# Patient Record
Sex: Female | Born: 1960 | Race: White | Hispanic: No | Marital: Married | Smoking: Former smoker
Health system: Southern US, Community
[De-identification: ages and names within clinical notes are randomized; demographics above are authoritative.]

## PROBLEM LIST (undated history)

## (undated) DIAGNOSIS — R0600 Dyspnea, unspecified: Secondary | ICD-10-CM

## (undated) DIAGNOSIS — C169 Malignant neoplasm of stomach, unspecified: Secondary | ICD-10-CM

## (undated) DIAGNOSIS — F32A Depression, unspecified: Secondary | ICD-10-CM

## (undated) DIAGNOSIS — K922 Gastrointestinal hemorrhage, unspecified: Secondary | ICD-10-CM

## (undated) DIAGNOSIS — I10 Essential (primary) hypertension: Secondary | ICD-10-CM

## (undated) DIAGNOSIS — E039 Hypothyroidism, unspecified: Secondary | ICD-10-CM

## (undated) DIAGNOSIS — C50919 Malignant neoplasm of unspecified site of unspecified female breast: Secondary | ICD-10-CM

## (undated) DIAGNOSIS — Z5189 Encounter for other specified aftercare: Secondary | ICD-10-CM

## (undated) DIAGNOSIS — J45909 Unspecified asthma, uncomplicated: Secondary | ICD-10-CM

## (undated) DIAGNOSIS — C801 Malignant (primary) neoplasm, unspecified: Secondary | ICD-10-CM

## (undated) DIAGNOSIS — D649 Anemia, unspecified: Secondary | ICD-10-CM

## (undated) DIAGNOSIS — E079 Disorder of thyroid, unspecified: Secondary | ICD-10-CM

## (undated) HISTORY — DX: Anemia, unspecified: D64.9

## (undated) HISTORY — PX: UPPER GASTROINTESTINAL ENDOSCOPY: SHX188

## (undated) HISTORY — PX: CARPAL TUNNEL RELEASE: SHX101

## (undated) HISTORY — PX: COLONOSCOPY: SHX174

## (undated) HISTORY — DX: Encounter for other specified aftercare: Z51.89

## (undated) HISTORY — PX: BARIATRIC SURGERY: SHX1103

## (undated) HISTORY — DX: Malignant neoplasm of stomach, unspecified: C16.9

## (undated) HISTORY — DX: Gastrointestinal hemorrhage, unspecified: K92.2

## (undated) HISTORY — DX: Malignant neoplasm of unspecified site of unspecified female breast: C50.919

---

## 1994-03-18 HISTORY — PX: ABDOMINAL HYSTERECTOMY: SHX81

## 2005-03-18 HISTORY — PX: BILATERAL SALPINGOOPHORECTOMY: SHX1223

## 2005-03-18 HISTORY — PX: MASTECTOMY: SHX3

## 2014-04-27 ENCOUNTER — Encounter: Payer: Self-pay | Admitting: *Deleted

## 2014-04-27 ENCOUNTER — Emergency Department (INDEPENDENT_AMBULATORY_CARE_PROVIDER_SITE_OTHER): Payer: BLUE CROSS/BLUE SHIELD

## 2014-04-27 ENCOUNTER — Emergency Department (INDEPENDENT_AMBULATORY_CARE_PROVIDER_SITE_OTHER)
Admission: EM | Admit: 2014-04-27 | Discharge: 2014-04-27 | Disposition: A | Discharge status: Home / Self Care | Payer: BLUE CROSS/BLUE SHIELD | Source: Home / Self Care | Attending: Family Medicine | Admitting: Family Medicine

## 2014-04-27 DIAGNOSIS — R05 Cough: Secondary | ICD-10-CM

## 2014-04-27 DIAGNOSIS — R062 Wheezing: Secondary | ICD-10-CM

## 2014-04-27 DIAGNOSIS — Z87891 Personal history of nicotine dependence: Secondary | ICD-10-CM

## 2014-04-27 DIAGNOSIS — J209 Acute bronchitis, unspecified: Secondary | ICD-10-CM

## 2014-04-27 HISTORY — DX: Disorder of thyroid, unspecified: E07.9

## 2014-04-27 HISTORY — DX: Essential (primary) hypertension: I10

## 2014-04-27 MED ORDER — PREDNISONE 20 MG PO TABS: 20.0000 mg | ORAL_TABLET | Freq: Two times a day (BID) | ORAL | Status: DC

## 2014-04-27 MED ORDER — BENZONATATE 200 MG PO CAPS: 200.0000 mg | ORAL_CAPSULE | Freq: Every day | ORAL | Status: DC

## 2014-04-27 MED ORDER — ERYTHROMYCIN BASE 500 MG PO TABS: ORAL_TABLET | ORAL | Status: DC

## 2014-04-27 MED ORDER — DOXYCYCLINE HYCLATE 100 MG PO CAPS: 100.0000 mg | ORAL_CAPSULE | Freq: Two times a day (BID) | ORAL | Status: DC

## 2014-04-27 NOTE — ED Notes (Signed)
Mariah Kemp c/o cough, congestion, wheezing, and SOB with exertion x 5 weeks. Cough is worse when lying down. No otc meds taken, afebrile.

## 2014-04-27 NOTE — ED Provider Notes (Signed)
CSN: [id number]     Arrival date & time 04/27/14  7353 History   First MD Initiated Contact with Patient 04/27/14 [phone]     Chief Complaint  Patient presents with  . Cough      HPI Comments: About 1.5 months ago patient developed typical cold-like symptoms including mild sore throat, sinus congestion, headache, fatigue, and cough.  All symptoms resolved except for a persistent cough, now partly productive.  She has developed wheezing and shortness of breath with activity.  The wheezing is worse at night.  No pleuritic pain.  No fevers, chills, and sweats.  She occasionally gags when coughing. She does not have asthma, but she states that she has had wheezing in the past during a respiratory illness.  She does not remember her last Tdap.  The history is provided by the patient.    Past Medical History  Diagnosis Date  . Hypertension   . Thyroid disease    Past Surgical History  Procedure Laterality Date  . Abdominal hysterectomy    . Carpal tunnel release    . Mastectomy     Family History  Problem Relation Age of Onset  . Cancer Father     liver CA   History  Substance Use Topics  . Smoking status: Former Smoker    Quit date: 04/27/1994  . Smokeless tobacco: Never Used  . Alcohol Use: No   OB History    No data available     Review of Systems No sore throat + cough No pleuritic pain + wheezing No nasal congestion No post-nasal drainage No sinus pain/pressure No itchy/red eyes No earache No hemoptysis + SOB No fever/chills No nausea No vomiting No abdominal pain No diarrhea No urinary symptoms No skin rash No fatigue No myalgias No headache Used OTC meds without relief  Allergies  Review of patient's allergies indicates no known allergies.  Home Medications   Prior to Admission medications   Medication Sig Start Date End Date Taking? Authorizing Provider  levothyroxine (SYNTHROID, LEVOTHROID) 137 MCG tablet Take 137 mcg by mouth daily before  breakfast.   Yes Historical Provider, MD  lisinopril-hydrochlorothiazide (PRINZIDE,ZESTORETIC) 20-25 MG per tablet Take 1 tablet by mouth daily.   Yes Historical Provider, MD  benzonatate (TESSALON) 200 MG capsule Take 1 capsule (200 mg total) by mouth at bedtime. Take as needed for cough 04/27/14   Kandra Nicolas, MD  doxycycline (VIBRAMYCIN) 100 MG capsule Take 1 capsule (100 mg total) by mouth 2 (two) times daily. Take with food. 04/27/14   Kandra Nicolas, MD  predniSONE (DELTASONE) 20 MG tablet Take 1 tablet (20 mg total) by mouth 2 (two) times daily. Take with food. 04/27/14   Kandra Nicolas, MD   BP 115/77 mmHg  Pulse 77  Temp(Src) 97.7 F (36.5 C) (Oral)  Resp 18  Ht 5' 0.5" (1.537 m)  Wt 180 lb (81.647 kg)  BMI 34.56 kg/m2  SpO2 96% Physical Exam Nursing notes and Vital Signs reviewed. Appearance:  Patient appears stated age, and in no acute distress.  Patient is obese (BMI 34.6) Eyes:  Pupils are equal, round, and reactive to light and accomodation.  Extraocular movement is intact.  Conjunctivae are not inflamed  Ears:  Canals normal.  Tympanic membranes normal.  Nose:  Mildly congested turbinates.  No sinus tenderness.    Pharynx:  Normal Neck:  Supple.  Enlarged posterior nodes with tenderness on the left. Lungs:  Clear to auscultation.  Breath sounds are equal.  Heart:  Regular rate and rhythm without murmurs, rubs, or gallops.  Abdomen:  Nontender without masses or hepatosplenomegaly.  Bowel sounds are present.  No CVA or flank tenderness.  Extremities:  No edema.  No calf tenderness Skin:  No rash present.   ED Course  Procedures  none    Imaging Review Dg Chest 2 View  04/27/2014   CLINICAL DATA:  Cough and wheezing.  Former smoker  EXAM: CHEST  2 VIEW  COMPARISON:  None.  FINDINGS: Diffuse interstitial coarsening which is likely bronchitic. No evidence for pneumonia or edema. No effusion or pneumothorax. Normal heart size and mediastinal contours. Extensive surgical  change to the right breast and axilla.  IMPRESSION: Bronchitic markings without pneumonia or edema.   Electronically Signed   By: Monte Fantasia M.D.   On: 04/27/2014 10:46     MDM   1. Acute bronchitis, unspecified organism    Begin doxycycline 100mg  BID to cover atypicals.  Prescription written for Benzonatate [facility]) to take at bedtime for night-time cough.  Begin prednisone burst  Take plain guaifenesin 1200mg  (Mucinex) twice daily, with plenty of water, for cough and congestion.  Get adequate rest.   Try warm salt water gargles for sore throat.  Stop all antihistamines for now, and other non-prescription cough/cold preparations. Recommend a Tdap when well.  Follow-up with family doctor if not improving about one week.  If not improved must consider ACEI induced cough.    Kandra Nicolas, MD 04/27/14 1218

## 2014-04-27 NOTE — Discharge Instructions (Signed)
Take plain guaifenesin 1200mg  (Mucinex) twice daily, with plenty of water, for cough and congestion.  Get adequate rest.   Try warm salt water gargles for sore throat.  Stop all antihistamines for now, and other non-prescription cough/cold preparations. Recommend a Tdap when well.  Follow-up with family doctor if not improving about one week.

## 2014-05-31 ENCOUNTER — Encounter: Payer: Self-pay | Admitting: Family Medicine

## 2014-05-31 ENCOUNTER — Ambulatory Visit (INDEPENDENT_AMBULATORY_CARE_PROVIDER_SITE_OTHER): Payer: BLUE CROSS/BLUE SHIELD | Admitting: Family Medicine

## 2014-05-31 VITALS — BP 110/74 | HR 76 | Ht 60.0 in | Wt 180.0 lb

## 2014-05-31 DIAGNOSIS — I10 Essential (primary) hypertension: Secondary | ICD-10-CM | POA: Diagnosis not present

## 2014-05-31 DIAGNOSIS — R062 Wheezing: Secondary | ICD-10-CM

## 2014-05-31 DIAGNOSIS — E039 Hypothyroidism, unspecified: Secondary | ICD-10-CM | POA: Diagnosis not present

## 2014-05-31 DIAGNOSIS — Z1239 Encounter for other screening for malignant neoplasm of breast: Secondary | ICD-10-CM | POA: Diagnosis not present

## 2014-05-31 DIAGNOSIS — J410 Simple chronic bronchitis: Secondary | ICD-10-CM

## 2014-05-31 DIAGNOSIS — J42 Unspecified chronic bronchitis: Secondary | ICD-10-CM | POA: Insufficient documentation

## 2014-05-31 MED ORDER — TIOTROPIUM BROMIDE-OLODATEROL 2.5-2.5 MCG/ACT IN AERS: 2.0000 [IU] | INHALATION_SPRAY | Freq: Every day | RESPIRATORY_TRACT | Status: DC

## 2014-05-31 NOTE — Progress Notes (Signed)
CC: Mariah Kemp is a 54 y.o. female is here for Establish Care   Subjective: HPI:  Very pleasant 54 year old here to establish care  History of essential hypertension she is on on lisinopril-hydrochlorothiazide and has been taking this daily for over 5 years now. She tells me that ever since she started this dose she has had normotensive blood pressures. She denies any known side effects or intolerance. Denies any nonproductive cough or angioedema.  Reports a history of hypothyroidism that was diagnosed in her mid 28s. She tells me that her TSH has fluctuated on a every 3 month basis over the past few years and she is currently taking 137 g of levothyroxine but has been up to 300 g a day at one point. She believes it's been more than 3 months since her TSH was checked last. She's had some unintentional weight gain but no skin changes or hair changes. No gastrointestinal complaints.  She complains of wheezing on a daily basis that has been present for at least 3 months now. She's had this in the past decades ago however it went away after she stopped smoking. She tells me it's worse in the evening and at its worst it's only mild in severity but becoming much more annoying. The wheezing resolves for a few hours if she coughs. She has a daily productive cough but never with blood in her sputum. He also reports shortness of breath but only after climbing stairs. She denies fevers, chills, nor chest discomfort  Review of Systems - General ROS: negative for - chills, fever, night sweats, weight loss Ophthalmic ROS: negative for - decreased vision Psychological ROS: negative for - anxiety or depression ENT ROS: negative for - hearing change, nasal congestion, tinnitus or allergies Hematological and Lymphatic ROS: negative for - bleeding problems, bruising or swollen lymph nodes Breast ROS: negative Cardiovascular ROS: no chest pain  Gastrointestinal ROS: no abdominal pain, change in bowel habits, or  black or bloody stools Genito-Urinary ROS: negative for - genital discharge, genital ulcers, incontinence or abnormal bleeding from genitals Musculoskeletal ROS: negative for - joint pain or muscle pain Neurological ROS: negative for - headaches or memory loss Dermatological ROS: negative for lumps, mole changes, rash and skin lesion changes  Past Medical History  Diagnosis Date  . Hypertension   . Thyroid disease     Past Surgical History  Procedure Laterality Date  . Abdominal hysterectomy  1996  . Carpal tunnel release    . Mastectomy  2007   Family History  Problem Relation Age of Onset  . Cancer Father     liver CA    History   Social History  . Marital Status: Married    Spouse Name: N/A  . Number of Children: N/A  . Years of Education: N/A   Occupational History  . Not on file.   Social History Main Topics  . Smoking status: Former Smoker    Quit date: 04/27/1994  . Smokeless tobacco: Never Used  . Alcohol Use: No  . Drug Use: No  . Sexual Activity: No   Other Topics Concern  . Not on file   Social History Narrative     Objective: BP 110/74 mmHg  Pulse 76  Ht 5' (1.524 m)  Wt 180 lb (81.647 kg)  BMI 35.15 kg/m2  General: Alert and Oriented, No Acute Distress HEENT: Pupils equal, round, reactive to light. Conjunctivae clear.  Moist mucous membranes and pharynx Unremarkable. No palpable thyromegaly Lungs: Comfortable work of breathing  with trace end expiratory wheezing in the right upper posterior lung field and left lower posterior lung field. No rhonchi or rales Cardiac: Regular rate and rhythm. Normal S1/S2.  No murmurs, rubs, nor gallops.   Extremities: No peripheral edema.  Strong peripheral pulses.  Mental Status: No depression, anxiety, nor agitation. Skin: Warm and dry.  Assessment & Plan: Mariah Kemp was seen today for establish care.  Diagnoses and all orders for this visit:  Essential hypertension  Hypothyroidism, unspecified  hypothyroidism type Orders: -     TSH  Wheezing Orders: -     Tiotropium Bromide-Olodaterol (STIOLTO RESPIMAT) 2.5-2.5 MCG/ACT AERS; Inhale 2 Units into the lungs daily.  Screening for malignant neoplasm of breast Orders: -     MM DIGITAL SCREENING BILATERAL; Future  Simple chronic bronchitis   Essential hypertension: Controlled continue lisinopril-hydrochlorothiazide Hypothyroidism: Due for TSH, continue 137 g of levothyroxine pending results. She's a few months overdue for mammography, she tells me that she's been released to having screening mammograms no longer requiring diagnostics. Wheezing most likely represents chronic bronchitis given her history of smoking and x-ray findings from last month. I've encouraged her to begin stiolto daily a sample was provided to her and have asked her to call me if she would like a formal prescription.  Return for 2-3 Months for routine follow up.

## 2014-06-01 ENCOUNTER — Telehealth: Payer: Self-pay | Admitting: Family Medicine

## 2014-06-01 LAB — TSH: TSH: 31.475 u[IU]/mL — ABNORMAL HIGH (ref 0.350–4.500)

## 2014-06-01 MED ORDER — LEVOTHYROXINE SODIUM 150 MCG PO TABS: 150.0000 ug | ORAL_TABLET | Freq: Every day | ORAL | Status: DC

## 2014-06-01 NOTE — Telephone Encounter (Signed)
Seth Bake, Will you please let patient know that her thyroid test confirms that her current levothyroxine dose is too low.  I've sent in a new dose of 150 micrograms to her [facility], we'll want to recheck this in 3 months.

## 2014-06-01 NOTE — Telephone Encounter (Signed)
Pt.notified

## 2014-08-03 ENCOUNTER — Other Ambulatory Visit: Payer: Self-pay

## 2014-08-03 MED ORDER — LISINOPRIL-HYDROCHLOROTHIAZIDE 20-25 MG PO TABS: 1.0000 | ORAL_TABLET | Freq: Every day | ORAL | Status: DC

## 2014-08-03 NOTE — Telephone Encounter (Signed)
Patient called stated that she was seen on 03/15 /16 to EST, patient stated that PCP was waiting on her medical records to be transferred over. Patient is requesting a refill on her Lisinopril. Is it ok to refill and if so how many pills and refills? Verble Styron,CMA

## 2014-08-03 NOTE — Telephone Encounter (Signed)
Patient has been informed that Lisinopril was sent to her [place]. Rhonda Cunningham,CMA

## 2014-08-03 NOTE — Telephone Encounter (Signed)
Rx sent to [facility] in [place] 

## 2014-08-04 ENCOUNTER — Telehealth: Payer: Self-pay | Admitting: *Deleted

## 2014-08-04 ENCOUNTER — Ambulatory Visit (INDEPENDENT_AMBULATORY_CARE_PROVIDER_SITE_OTHER): Payer: BLUE CROSS/BLUE SHIELD

## 2014-08-04 ENCOUNTER — Other Ambulatory Visit: Payer: Self-pay | Admitting: *Deleted

## 2014-08-04 DIAGNOSIS — R062 Wheezing: Secondary | ICD-10-CM

## 2014-08-04 DIAGNOSIS — Z1231 Encounter for screening mammogram for malignant neoplasm of breast: Secondary | ICD-10-CM | POA: Diagnosis not present

## 2014-08-04 DIAGNOSIS — Z1239 Encounter for other screening for malignant neoplasm of breast: Secondary | ICD-10-CM

## 2014-08-04 MED ORDER — TIOTROPIUM BROMIDE-OLODATEROL 2.5-2.5 MCG/ACT IN AERS: 2.0000 [IU] | INHALATION_SPRAY | Freq: Every day | RESPIRATORY_TRACT | Status: DC

## 2014-08-04 NOTE — Telephone Encounter (Signed)
Pt walked in today asking for her stiolto to be sent to the pharm so I took care of that.  While she was here she asked if you had received her records yet.  She wanted to wait until you had everything before she made her f/u appt with you.  Please advise.

## 2014-08-05 NOTE — Telephone Encounter (Signed)
Yes records have been received.

## 2014-08-05 NOTE — Telephone Encounter (Signed)
Pt notified & transferred to scheduling. 

## 2014-08-19 ENCOUNTER — Ambulatory Visit (INDEPENDENT_AMBULATORY_CARE_PROVIDER_SITE_OTHER): Payer: BLUE CROSS/BLUE SHIELD | Admitting: Family Medicine

## 2014-08-19 ENCOUNTER — Encounter: Payer: Self-pay | Admitting: Family Medicine

## 2014-08-19 VITALS — BP 118/77 | HR 68 | Ht 60.0 in | Wt 180.0 lb

## 2014-08-19 DIAGNOSIS — Z0000 Encounter for general adult medical examination without abnormal findings: Secondary | ICD-10-CM | POA: Diagnosis not present

## 2014-08-19 DIAGNOSIS — Z853 Personal history of malignant neoplasm of breast: Secondary | ICD-10-CM | POA: Diagnosis not present

## 2014-08-19 DIAGNOSIS — J342 Deviated nasal septum: Secondary | ICD-10-CM

## 2014-08-19 NOTE — Progress Notes (Signed)
CC: Mariah Kemp is a 54 y.o. female is here for Annual Exam   Subjective: HPI:  Colonoscopy: Normal at age 67 repeated H 58 Papsmear: Not indicated after hysterectomy  Mammogram: UTD   Influenza Vaccine: No current indication Pneumovax: No current indication Td/Tdap: declined Zoster: (Start 54 yo)   Review of Systems - General ROS: negative for - chills, fever, night sweats, weight gain or weight loss Ophthalmic ROS: negative for - decreased vision Psychological ROS: negative for - anxiety or depression ENT ROS: negative for - hearing change, nasal congestion, tinnitus or allergies Hematological and Lymphatic ROS: negative for - bleeding problems, bruising or swollen lymph nodes Breast ROS: negative Respiratory ROS: no cough, shortness of breath, or wheezing Cardiovascular ROS: no chest pain or dyspnea on exertion Gastrointestinal ROS: no abdominal pain, change in bowel habits, or black or bloody stools Genito-Urinary ROS: negative for - genital discharge, genital ulcers, incontinence or abnormal bleeding from genitals Musculoskeletal ROS: negative for - joint pain or muscle pain Neurological ROS: negative for - headaches or memory loss Dermatological ROS: negative for lumps, mole changes, rash and skin lesion changes  Past Medical History  Diagnosis Date  . Hypertension   . Thyroid disease     Past Surgical History  Procedure Laterality Date  . Abdominal hysterectomy  1996  . Carpal tunnel release    . Mastectomy  2007   Family History  Problem Relation Age of Onset  . Cancer Father     liver CA    History   Social History  . Marital Status: Married    Spouse Name: N/A  . Number of Children: N/A  . Years of Education: N/A   Occupational History  . Not on file.   Social History Main Topics  . Smoking status: Former Smoker    Quit date: 04/27/1994  . Smokeless tobacco: Never Used  . Alcohol Use: No  . Drug Use: No  . Sexual Activity: No   Other Topics  Concern  . Not on file   Social History Narrative     Objective: BP 118/77 mmHg  Pulse 68  Ht 5' (1.524 m)  Wt 180 lb (81.647 kg)  BMI 35.15 kg/m2  General: No Acute Distress HEENT: Atraumatic, normocephalic, conjunctivae normal without scleral icterus.  No nasal discharge mild deviated septum to the left, hearing grossly intact, TMs with good landmarks bilaterally with no middle ear abnormalities, posterior pharynx clear without oral lesions. Neck: Supple, trachea midline, no cervical nor supraclavicular adenopathy. Pulmonary: Clear to auscultation bilaterally without wheezing, rhonchi, nor rales. Cardiac: Regular rate and rhythm.  No murmurs, rubs, nor gallops. No peripheral edema.  2+ peripheral pulses bilaterally. Abdomen: Bowel sounds normal.  No masses.  Non-tender without rebound.  Negative Murphy's sign. MSK: Grossly intact, no signs of weakness.  Full strength throughout upper and lower extremities.  Full ROM in upper and lower extremities.  No midline spinal tenderness. Neuro: Gait unremarkable, CN II-XII grossly intact.  C5-C6 Reflex 2/4 Bilaterally, L4 Reflex 2/4 Bilaterally.  Cerebellar function intact. Skin: No rashes. Psych: Alert and oriented to person/place/time.  Thought process normal. No anxiety/depression.   Assessment & Plan: [name] was seen today for annual exam.  Diagnoses and all orders for this visit:  Annual physical exam Orders: -     TSH -     Comp Met (CMET) -     Lipid panel -     CBC  Deviated septum Orders: -     Ambulatory referral to  ENT  History of breast cancer   Healthy lifestyle interventions including but not limited to regular exercise, a healthy low fat diet, moderation of salt intake, the dangers of tobacco/alcohol/recreational drug use, nutrition supplementation, and accident avoidance were discussed with the patient and a handout was provided for future reference.  She would like a referral to ENT for further management  deviated septum since it interferes with breathing of the nose Return in about 3 months (around 11/19/2014).

## 2014-08-19 NOTE — Patient Instructions (Signed)
Dr. Eleaner Dibartolo's General Advice Following Your Complete Physical Exam  The Benefits of Regular Exercise: Unless you suffer from an uncontrolled cardiovascular condition, studies strongly suggest that regular exercise and physical activity will add to both the quality and length of your life.  The World Health Organization recommends 150 minutes of moderate intensity aerobic activity every week.  This is best split over 3-4 days a week, and can be as simple as a brisk walk for just over 35 minutes "most days of the week".  This type of exercise has been shown to lower LDL-Cholesterol, lower average blood sugars, lower blood pressure, lower cardiovascular disease risk, improve memory, and increase one's overall sense of wellbeing.  The addition of anaerobic (or "strength training") exercises offers additional benefits including but not limited to increased metabolism, prevention of osteoporosis, and improved overall cholesterol levels.  How Can I Strive For A Low-Fat Diet?: Current guidelines recommend that 25-35 percent of your daily energy (food) intake should come from fats.  One might ask how can this be achieved without having to dissect each meal on a daily basis?  Switch to skim or 1% milk instead of whole milk.  Focus on lean meats such as ground turkey, fresh fish, baked chicken, and lean cuts of beef as your source of dietary protein.  Limit saturated fat consumption to less than 10% of your daily caloric intake.  Limit trans fatty acid consumption primarily by limiting synthetic trans fats such as partially hydrogenated oils (Ex: fried fast foods).  Substitute olive or vegetable oil for solid fats where possible.  Moderation of Salt Intake: Provided you don't carry a diagnosis of congestive heart failure nor renal failure, I recommend a daily allowance of no more than 2300 mg of salt (sodium).  Keeping under this daily goal is associated with a decreased risk of cardiovascular events, creeping  above it can lead to elevated blood pressures and increases your risk of cardiovascular events.  Milligrams (mg) of salt is listed on all nutrition labels, and your daily intake can add up faster than you think.  Most canned and frozen dinners can pack in over half your daily salt allowance in one meal.    Lifestyle Health Risks: Certain lifestyle choices carry specific health risks.  As you may already know, tobacco use has been associated with increasing one's risk of cardiovascular disease, pulmonary disease, numerous cancers, among many other issues.  What you may not know is that there are medications and nicotine replacement strategies that can more than double your chances of successfully quitting.  I would be thrilled to help manage your quitting strategy if you currently use tobacco products.  When it comes to alcohol use, I've yet to find an "ideal" daily allowance.  Provided an individual does not have a medical condition that is exacerbated by alcohol consumption, general guidelines determine "safe drinking" as no more than two standard drinks for a man or no more than one standard drink for a female per day.  However, much debate still exists on whether any amount of alcohol consumption is technically "safe".  My general advice, keep alcohol consumption to a minimum for general health promotion.  If you or others believe that alcohol, tobacco, or recreational drug use is interfering with your life, I would be happy to provide confidential counseling regarding treatment options.  General "Over The Counter" Nutrition Advice: Postmenopausal women should aim for a daily calcium intake of 1200 mg, however a significant portion of this might already be   provided by diets including milk, yogurt, cheese, and other dairy products.  Vitamin D has been shown to help preserve bone density, prevent fatigue, and has even been shown to help reduce falls in the elderly.  Ensuring a daily intake of 800 Units of  Vitamin D is a good place to start to enjoy the above benefits, we can easily check your Vitamin D level to see if you'd potentially benefit from supplementation beyond 800 Units a day.  Folic Acid intake should be of particular concern to women of childbearing age.  Daily consumption of 400-800 mcg of Folic Acid is recommended to minimize the chance of spinal cord defects in a fetus should pregnancy occur.    For many adults, accidents still remain one of the most common culprits when it comes to cause of death.  Some of the simplest but most effective preventitive habits you can adopt include regular seatbelt use, proper helmet use, securing firearms, and regularly testing your smoke and carbon monoxide detectors.  Mariah Kemp B. Mariah Arpin DO [facility] [address], [address] , [place] [address] Phone: [phone]  

## 2014-08-24 ENCOUNTER — Telehealth: Payer: Self-pay | Admitting: Family Medicine

## 2014-08-24 DIAGNOSIS — E781 Pure hyperglyceridemia: Secondary | ICD-10-CM | POA: Insufficient documentation

## 2014-08-24 DIAGNOSIS — E039 Hypothyroidism, unspecified: Secondary | ICD-10-CM | POA: Insufficient documentation

## 2014-08-24 LAB — COMPREHENSIVE METABOLIC PANEL
ALT: 36 U/L — ABNORMAL HIGH (ref 0–35)
AST: 31 U/L (ref 0–37)
Albumin: 3.9 g/dL (ref 3.5–5.2)
Alkaline Phosphatase: 55 U/L (ref 39–117)
BUN: 15 mg/dL (ref 6–23)
CHLORIDE: 102 meq/L (ref 96–112)
CO2: 30 meq/L (ref 19–32)
Calcium: 9.2 mg/dL (ref 8.4–10.5)
Creat: 0.58 mg/dL (ref 0.50–1.10)
Glucose, Bld: 93 mg/dL (ref 70–99)
Potassium: 4.2 mEq/L (ref 3.5–5.3)
Sodium: 141 mEq/L (ref 135–145)
TOTAL PROTEIN: 7 g/dL (ref 6.0–8.3)
Total Bilirubin: 0.3 mg/dL (ref 0.2–1.2)

## 2014-08-24 LAB — CBC
HCT: 36.2 % (ref 36.0–46.0)
Hemoglobin: 12.4 g/dL (ref 12.0–15.0)
MCH: 32 pg (ref 26.0–34.0)
MCHC: 34.3 g/dL (ref 30.0–36.0)
MCV: 93.3 fL (ref 78.0–100.0)
MPV: 11.2 fL (ref 8.6–12.4)
Platelets: 231 10*3/uL (ref 150–400)
RBC: 3.88 MIL/uL (ref 3.87–5.11)
RDW: 13.7 % (ref 11.5–15.5)
WBC: 6.2 10*3/uL (ref 4.0–10.5)

## 2014-08-24 LAB — LIPID PANEL
CHOLESTEROL: 186 mg/dL (ref 0–200)
HDL: 28 mg/dL — ABNORMAL LOW (ref >=46)
LDL CALC: 86 mg/dL (ref 0–99)
Total CHOL/HDL Ratio: 6.6 Ratio
Triglycerides: 362 mg/dL — ABNORMAL HIGH (ref <150)
VLDL: 72 mg/dL — AB (ref 0–40)

## 2014-08-24 LAB — TSH: TSH: 14.169 u[IU]/mL — AB (ref 0.350–4.500)

## 2014-08-24 MED ORDER — LEVOTHYROXINE SODIUM 175 MCG PO TABS: 175.0000 ug | ORAL_TABLET | Freq: Every day | ORAL | Status: DC

## 2014-08-24 NOTE — Telephone Encounter (Signed)
Left message on vm with results  

## 2014-08-24 NOTE — Telephone Encounter (Signed)
Mariah Kemp, Will you please let patient know that her thyroid levels have improved but it still appears that her supplement is under-dosed therefore I've sent a new Rx of 175 micrograms of levothyroxine to her [facility].  Also her triglyceride level was moderately elevated which is causing some mild liver inflammation.  This can be improved with engaging in 30-45 minutes of moderate exercise most days of the week and starting a 1g OTC fish oil capsule twice a day.  I'd recommend she return in two months to have these numbers rechecked.

## 2014-09-07 ENCOUNTER — Encounter: Payer: Self-pay | Admitting: Family Medicine

## 2014-09-07 DIAGNOSIS — J339 Nasal polyp, unspecified: Secondary | ICD-10-CM | POA: Insufficient documentation

## 2014-12-09 ENCOUNTER — Ambulatory Visit: Payer: BLUE CROSS/BLUE SHIELD | Admitting: Family Medicine

## 2014-12-15 ENCOUNTER — Ambulatory Visit (INDEPENDENT_AMBULATORY_CARE_PROVIDER_SITE_OTHER): Payer: BLUE CROSS/BLUE SHIELD | Admitting: Family Medicine

## 2014-12-15 ENCOUNTER — Encounter: Payer: Self-pay | Admitting: Family Medicine

## 2014-12-15 VITALS — BP 117/76 | HR 63 | Wt 182.0 lb

## 2014-12-15 DIAGNOSIS — I10 Essential (primary) hypertension: Secondary | ICD-10-CM

## 2014-12-15 DIAGNOSIS — E781 Pure hyperglyceridemia: Secondary | ICD-10-CM | POA: Diagnosis not present

## 2014-12-15 DIAGNOSIS — E039 Hypothyroidism, unspecified: Secondary | ICD-10-CM | POA: Diagnosis not present

## 2014-12-15 DIAGNOSIS — R062 Wheezing: Secondary | ICD-10-CM

## 2014-12-15 MED ORDER — LISINOPRIL-HYDROCHLOROTHIAZIDE 20-25 MG PO TABS: 1.0000 | ORAL_TABLET | Freq: Every day | ORAL | Status: DC

## 2014-12-15 MED ORDER — TIOTROPIUM BROMIDE-OLODATEROL 2.5-2.5 MCG/ACT IN AERS: 2.0000 [IU] | INHALATION_SPRAY | Freq: Every day | RESPIRATORY_TRACT | Status: DC

## 2014-12-15 NOTE — Progress Notes (Signed)
CC: Mariah Kemp is a 54 y.o. female is here for Hypothyroidism   Subjective: HPI:   follow-up hypothyroidism:  Since I saw her last she has increased her levothyroxine as prescribed. She denies any side effects with the new dosage. She denies any intentional weight loss or gain. There has been no change in energy, mood, no hair or skin changes. Denies any gastrointestinal complaints   Follow-up hypertriglyceridemia: She was unable to tolerate fish oil capsules due to fishy regurgitation. This stopped after she stopped taking the Fish oil. She is not interested in ever trying fish oil again.   Follow-up hypertension: Continues to take lisinopril-hydrochlorothiazide on a daily basis without known side effects. No outside blood pressures to report. No chest pain shortness of breath orthopnea nor peripheral edema   Follow-up wheezing: she tells me that [employer]  Works really well to reduce her wheezing however she only uses it on  An as-needed basis.  She denies any wheezing at the present time nor any cough or chest pain   Review Of Systems Outlined In HPI  Past Medical History  Diagnosis Date  . Hypertension   . Thyroid disease     Past Surgical History  Procedure Laterality Date  . Abdominal hysterectomy  1996  . Carpal tunnel release    . Mastectomy  2007   Family History  Problem Relation Age of Onset  . Cancer Father     liver [place]    Social History   Social History  . Marital Status: Married    Spouse Name: N/A  . Number of Children: N/A  . Years of Education: N/A   Occupational History  . Not on file.   Social History Main Topics  . Smoking status: Former Smoker    Quit date: 04/27/1994  . Smokeless tobacco: Never Used  . Alcohol Use: No  . Drug Use: No  . Sexual Activity: No   Other Topics Concern  . Not on file   Social History Narrative     Objective: BP 117/76 mmHg  Pulse 63  Wt 182 lb (82.555 kg)  General: Alert and Oriented, No Acute  Distress HEENT: Pupils equal, round, reactive to light. Conjunctivae clear.   Moist mucous membranes Lungs:  Comfortable work of breathing with trace  Expiratory wheezing. No rales or rhonchi. Cardiac: Regular rate and rhythm. Normal S1/S2.  No murmurs, rubs, nor gallops.   Extremities: No peripheral edema.  Strong peripheral pulses.  Mental Status: No depression, anxiety, nor agitation. Skin: Warm and dry.  Assessment & Plan: [place] was seen today for hypothyroidism.  Diagnoses and all orders for this visit:  Hypothyroidism, unspecified hypothyroidism type -     TSH  Hypertriglyceridemia  Essential hypertension  Wheezing -     Tiotropium Bromide-Olodaterol (STIOLTO RESPIMAT) 2.5-2.5 MCG/ACT AERS; Inhale 2 Units into the lungs daily.  Other orders -     lisinopril-hydrochlorothiazide (PRINZIDE,ZESTORETIC) 20-25 MG tablet; Take 1 tablet by mouth daily.    hypothyroidism: Continue levothyroxine pending TSH which is due today, clinically controlled.  Hypertriglyceridemia: Discussed trying Metamucil every evening to help with reduction of triglycerides, recheck in 3 months  Wheezing: Encouraged to use stiolto  On a daily basis,  Pointed out her wheezing today   And this would improve  If she took the medication on a daily basis  essential hypertension: Controlled continue lisinopril-HCTZ  Return if symptoms worsen or fail to improve.

## 2014-12-16 ENCOUNTER — Telehealth: Payer: Self-pay | Admitting: Family Medicine

## 2014-12-16 LAB — TSH: TSH: 3.512 u[IU]/mL (ref 0.350–4.500)

## 2014-12-16 MED ORDER — LEVOTHYROXINE SODIUM 175 MCG PO TABS: 175.0000 ug | ORAL_TABLET | Freq: Every day | ORAL | Status: DC

## 2014-12-16 NOTE — Telephone Encounter (Signed)
Mariah Kemp, Will you please let patient know that it appears her thyroid supplement dose is adequate therefore I've sent refills to her [facility]. F/U in six months.

## 2014-12-16 NOTE — Telephone Encounter (Signed)
Left message on vm with results  

## 2015-03-03 ENCOUNTER — Telehealth: Payer: Self-pay

## 2015-03-03 MED ORDER — ACYCLOVIR 400 MG PO TABS: 400.0000 mg | ORAL_TABLET | Freq: Three times a day (TID) | ORAL | Status: DC

## 2015-03-03 NOTE — Telephone Encounter (Signed)
Requested medication has been sent to [facility] in [place]

## 2015-03-03 NOTE — Telephone Encounter (Signed)
Patient called and left a message: broke out with fever blisters. She would like a refill on Zovirax. This is not on her medication list.

## 2015-07-22 ENCOUNTER — Other Ambulatory Visit: Payer: Self-pay | Admitting: Family Medicine

## 2015-09-06 ENCOUNTER — Encounter: Payer: Self-pay | Admitting: Family Medicine

## 2015-09-06 ENCOUNTER — Ambulatory Visit (INDEPENDENT_AMBULATORY_CARE_PROVIDER_SITE_OTHER): Payer: Self-pay | Admitting: Family Medicine

## 2015-09-06 VITALS — BP 107/72 | HR 81 | Wt 172.0 lb

## 2015-09-06 DIAGNOSIS — J410 Simple chronic bronchitis: Secondary | ICD-10-CM

## 2015-09-06 DIAGNOSIS — R062 Wheezing: Secondary | ICD-10-CM

## 2015-09-06 DIAGNOSIS — E039 Hypothyroidism, unspecified: Secondary | ICD-10-CM

## 2015-09-06 DIAGNOSIS — I10 Essential (primary) hypertension: Secondary | ICD-10-CM

## 2015-09-06 MED ORDER — TIOTROPIUM BROMIDE-OLODATEROL 2.5-2.5 MCG/ACT IN AERS: 2.0000 [IU] | INHALATION_SPRAY | Freq: Every day | RESPIRATORY_TRACT | Status: DC

## 2015-09-06 NOTE — Progress Notes (Signed)
CC: Mariah Kemp is a 55 y.o. female is here for Medication Refill   Subjective: HPI:  Follow-up bronchitis: She is using stilito a few times a week but not daily because she cannot afford to get refills on this medication. She tells me that if she is wheezing or has a cough to possible resolve her symptoms for at least 2 or 3 days. Nothing seems to make symptoms better or worse other than that described above. She denies any shortness of breath or blood in sputum.  Follow-up essential hypertension: Taking lisinopril-hydrochlorothiazide with 100% compliance. No chest pain orthopnea nor peripheral edema. She denies any motor or sensory disturbances. No outside blood pressures report  Follow-up hyperthyroidism: She denies any skin or hair changes nor any unintentional weight loss or gain. Taking levothyroxine with 100% compliance.     Review Of Systems Outlined In HPI  Past Medical History  Diagnosis Date  . Hypertension   . Thyroid disease     Past Surgical History  Procedure Laterality Date  . Abdominal hysterectomy  1996  . Carpal tunnel release    . Mastectomy  2007   Family History  Problem Relation Age of Onset  . Cancer Father     liver [place]    Social History   Social History  . Marital Status: Married    Spouse Name: N/A  . Number of Children: N/A  . Years of Education: N/A   Occupational History  . Not on file.   Social History Main Topics  . Smoking status: Former Smoker    Quit date: 04/27/1994  . Smokeless tobacco: Never Used  . Alcohol Use: No  . Drug Use: No  . Sexual Activity: No   Other Topics Concern  . Not on file   Social History Narrative     Objective: BP 107/72 mmHg  Pulse 81  Wt 172 lb (78.019 kg)  General: Alert and Oriented, No Acute Distress HEENT: Pupils equal, round, reactive to light. Conjunctivae clear.  Moist mucous membranes Lungs: Comfortable work of breathing with trace wheezing in the left upper posterior lung field. All  other fields are clear without rhonchi or rales Cardiac: Regular rate and rhythm. Normal S1/S2.  No murmurs, rubs, nor gallops.   Extremities: No peripheral edema.  Strong peripheral pulses.  Mental Status: No depression, anxiety, nor agitation. Skin: Warm and dry.  Assessment & Plan: Mariah Kemp was seen today for medication refill.  Diagnoses and all orders for this visit:  Simple chronic bronchitis ([place])  Essential hypertension  Hypothyroidism, unspecified hypothyroidism type -     TSH  Wheezing -     Tiotropium Bromide-Olodaterol (STIOLTO RESPIMAT) 2.5-2.5 MCG/ACT AERS; Inhale 2 Units into the lungs daily.   Chronic bronchitis: Uncontrolled, this will be improved with the 4 samples of Stiolto I gave her today to lower the financial burden on taking this medication on a daily basis. Essential hypertension: Controlled continue lisinopril to hydrochlorothiazide with refills tomorrow along with levothyroxine pending TSH Hypothyroidism: Clinically controlled but due for TSH will refill levothyroxine pending results   Return in about 6 months (around 03/07/2016).

## 2015-09-07 ENCOUNTER — Telehealth: Payer: Self-pay | Admitting: Family Medicine

## 2015-09-07 LAB — TSH: TSH: 0.17 m[IU]/L — AB

## 2015-09-07 MED ORDER — LEVOTHYROXINE SODIUM 175 MCG PO TABS: 175.0000 ug | ORAL_TABLET | Freq: Every day | ORAL | Status: DC

## 2015-09-07 MED ORDER — LISINOPRIL-HYDROCHLOROTHIAZIDE 20-25 MG PO TABS: 1.0000 | ORAL_TABLET | Freq: Every day | ORAL | Status: DC

## 2015-09-07 NOTE — Telephone Encounter (Signed)
Will you please let patient know that her thyroid supplement appears to be slightly higher than needed.  Since she's feeling so normal I don't think any drastic change needs to be applied to her levothyroxine but I'd recommend that once a week she take only half a tablet otherwise take a full tablet daily and return in 3 months to recheck labs.

## 2015-09-07 NOTE — Telephone Encounter (Signed)
vm left for pt to return call to clinic

## 2015-09-21 NOTE — Telephone Encounter (Signed)
Pt.notified

## 2015-12-11 ENCOUNTER — Other Ambulatory Visit: Payer: Self-pay | Admitting: Family Medicine

## 2016-03-27 ENCOUNTER — Other Ambulatory Visit: Payer: Self-pay

## 2016-03-27 MED ORDER — LEVOTHYROXINE SODIUM 175 MCG PO TABS: 175.0000 ug | ORAL_TABLET | Freq: Every day | ORAL | 0 refills | Status: DC

## 2016-03-27 NOTE — Progress Notes (Signed)
Pt called requesting a refill of synthroid to hold her over until she establishes care with Emeterio Reeve, DO on 04/02/2016. rx sent.

## 2016-03-28 ENCOUNTER — Ambulatory Visit: Payer: Self-pay | Admitting: Osteopathic Medicine

## 2016-04-02 ENCOUNTER — Encounter: Payer: Self-pay | Admitting: Osteopathic Medicine

## 2016-04-02 ENCOUNTER — Ambulatory Visit (INDEPENDENT_AMBULATORY_CARE_PROVIDER_SITE_OTHER): Payer: Self-pay | Admitting: Osteopathic Medicine

## 2016-04-02 VITALS — BP 113/64 | HR 72 | Ht 60.0 in | Wt 182.0 lb

## 2016-04-02 DIAGNOSIS — I10 Essential (primary) hypertension: Secondary | ICD-10-CM

## 2016-04-02 DIAGNOSIS — E781 Pure hyperglyceridemia: Secondary | ICD-10-CM

## 2016-04-02 DIAGNOSIS — J410 Simple chronic bronchitis: Secondary | ICD-10-CM

## 2016-04-02 DIAGNOSIS — B001 Herpesviral vesicular dermatitis: Secondary | ICD-10-CM

## 2016-04-02 DIAGNOSIS — Z853 Personal history of malignant neoplasm of breast: Secondary | ICD-10-CM

## 2016-04-02 DIAGNOSIS — E039 Hypothyroidism, unspecified: Secondary | ICD-10-CM

## 2016-04-02 MED ORDER — ACYCLOVIR 400 MG PO TABS: 400.0000 mg | ORAL_TABLET | Freq: Three times a day (TID) | ORAL | 2 refills | Status: DC

## 2016-04-02 NOTE — Progress Notes (Signed)
HPI: Mariah Kemp is a 56 y.o. female  who presents to [place] today, 04/02/16,  for chief complaint of:  Chief Complaint  Patient presents with  . Other    Switch from [facility]/ [facility] renewal     Essential hypertension: No home blood pressures report, no chest pain, pressure, shortness of breath. Not out of medications.  Simple chronic bronchitis: stiolto uses most days, typically experiences some wheezing type symptoms usually in the evenings. Has never had pulmonary function test or a chest x-ray to evaluate this issue. Insurance apparently is no longer covering her inhaled medication but she is not out of this  Hypothyroidism, unspecified type: Has been frustrated in the past with frequent adjustment of dose, would rather not have the TSH checked frequently or have the dose of the medication adjusted unless labs are markedly abnormal and or symptoms are present. Currently no significant fatigue, swelling, hair or skin changes, chest pain or palpitations.   Hypertriglyceridemia: Has been sometime since last lipid draw  History of breast cancer: Status post mastectomy, last mammogram was about 2 years ago and was BI-RADS 1   Past medical, surgical, social and family history reviewed: Patient Active Problem List   Diagnosis Date Noted  . Nasal polyposis 09/07/2014  . Hypertriglyceridemia 08/24/2014  . Hypothyroidism 08/24/2014  . History of breast cancer 08/19/2014  . Chronic bronchitis ([place]) 05/31/2014  . Essential hypertension 05/31/2014   Past Surgical History:  Procedure Laterality Date  . ABDOMINAL HYSTERECTOMY  1996  . CARPAL TUNNEL RELEASE    . MASTECTOMY  2007   Social History  Substance Use Topics  . Smoking status: Former Smoker    Quit date: 04/27/1994  . Smokeless tobacco: Never Used  . Alcohol use No   Family History  Problem Relation Age of Onset  . Cancer Father     liver CA     Current medication list and  allergy/intolerance information reviewed:   Current Outpatient Prescriptions on File Prior to Visit  Medication Sig Dispense Refill  . acyclovir (ZOVIRAX) 400 MG tablet Take 1 tablet (400 mg total) by mouth 3 (three) times daily. For five days. 15 tablet 2  . levothyroxine (SYNTHROID, LEVOTHROID) 175 MCG tablet Take 1 tablet (175 mcg total) by mouth daily before breakfast. Once a week take only half a tablet. 30 tablet 0  . lisinopril-hydrochlorothiazide (PRINZIDE,ZESTORETIC) 20-25 MG tablet TAKE ONE TABLET BY MOUTH ONCE DAILY 90 tablet 0  . Tiotropium Bromide-Olodaterol (STIOLTO RESPIMAT) 2.5-2.5 MCG/ACT AERS Inhale 2 Units into the lungs daily. 4 Inhaler 0   No current facility-administered medications on file prior to visit.    No Known Allergies    Review of Systems:  Constitutional: No recent illness  HEENT: No  headache, no vision change  Cardiac: No  chest pain, No  pressure, No palpitations  Respiratory:  No  shortness of breath. No  Cough  Gastrointestinal: No  abdominal pain, no change on bowel habits  Musculoskeletal: No new myalgia/arthralgia  Skin: No  Rash  Hem/Onc: No  easy bruising/bleeding, No  abnormal lumps/bumps  Neurologic: No  weakness, No  Dizziness  Psychiatric: No  concerns with depression, No  concerns with anxiety  Exam:  BP 113/64   Pulse 72   Ht 5' (1.524 m)   Wt 182 lb (82.6 kg)   BMI 35.54 kg/m   Constitutional: VS see above. General Appearance: alert, well-developed, well-nourished, NAD  Eyes: Normal lids and conjunctive, non-icteric sclera  Ears,  Nose, Mouth, Throat: MMM, Normal external inspection ears/nares/mouth/lips/gums.  Neck: No masses, trachea midline.   Respiratory: Normal respiratory effort. no wheeze, no rhonchi, no rales.   Cardiovascular: S1/S2 normal, no murmur, no rub/gallop auscultated. RRR.   Musculoskeletal: Gait normal. Symmetric and independent movement of all extremities  Neurological: Normal  balance/coordination. No tremor.  Skin: warm, dry, intact.   Psychiatric: Normal judgment/insight. Normal mood and affect. Oriented x3.      Previous records reviewed:  Mammogram 08/05/2014, BI-RADS Category 1 Chest x-ray 04/27/2014, performed for cough/wheezing, former smoker. Bronchitic markings without pneumonia or edema. Surgical changes to right breast and axilla. Images personally reviewed.  Labs: TSH 09/06/2015 was low, at this point patient states dose was adjusted to half tablet once per week. TSH prior to that 12/15/2014 within normal range. CBC, lipids, metabolic panel [id number] showed elevated triglycerides, low HDL, very slightly increased ALT, TSH at that point was 14 TSH 05/31/2014 was 31  ASSESSMENT/PLAN:   Essential hypertension - controlled on current meds - Plan: CBC with Differential/Platelet, COMPLETE METABOLIC PANEL WITH GFR, Lipid panel  Simple chronic bronchitis (H[facility]) - no previous eval for COPD/Asthma, consider PFT, sounds like she was having some wheezing issues and given inhaler to trial - lytic to get pulmonary function tests if possible  Hypothyroidism, unspecified type - Plan: Thyroid Panel With TSH - okay with me to be checking TSH levels 2-3 times per year based on levels/symptoms, patient asymptomatic at this point but overdue for TSH check  Hypertriglyceridemia - due for lipid panel, ordered.  History of breast cancer - due for mammogram - Plan: MM DIGITAL SCREENING BILATERAL  Cold sore - refill acyclovir    Patient Instructions  Call insurance company to see what is on their formulary for asthma/chronic bronchitis - they should be able to send you a list or possibly fax one to our office.   Let us know if you decide you'd like to do the lung function test at a future visit.   If knee continues to bother you, make appointment with Dr Georgina Snell or Dr. Darene Lamer ([facility] in our office).   Thanks for coming in today! Please let us know if  there are any questions or concerns!           Visit summary with medication list and pertinent instructions was printed for patient to review. All questions at time of visit were answered - patient instructed to contact office with any additional concerns. ER/RTC precautions were reviewed with the patient. Follow-up plan: Return in about 6 months (around 09/30/2016) for [place].  Note: Total time spent 40 minutes, greater than 50% of the visit was spent face-to-face counseling and coordinating care for the following: The primary encounter diagnosis was Essential hypertension. Diagnoses of Simple chronic bronchitis ([place]), Hypothyroidism, unspecified type, Hypertriglyceridemia, History of breast cancer, and Cold sore were also pertinent to this visit.Marland Kitchen

## 2016-04-02 NOTE — Patient Instructions (Addendum)
Call insurance company to see what is on their formulary for asthma/chronic bronchitis - they should be able to send you a list or possibly fax one to our office.   Let us know if you decide you'd like to do the lung function test at a future visit.   If knee continues to bother you, make appointment with Dr Georgina Snell or Dr. Darene Lamer ([facility] in our office).   Thanks for coming in today! Please let us know if there are any questions or concerns!

## 2016-04-29 LAB — CBC WITH DIFFERENTIAL/PLATELET
BASOS ABS: 59 {cells}/uL (ref 0–200)
Basophils Relative: 1 %
EOS PCT: 7 %
Eosinophils Absolute: 413 cells/uL (ref 15–500)
HCT: 35 % (ref 35.0–45.0)
HEMOGLOBIN: 11.6 g/dL — AB (ref 11.7–15.5)
LYMPHS ABS: 2301 {cells}/uL (ref 850–3900)
Lymphocytes Relative: 39 %
MCH: 30.9 pg (ref 27.0–33.0)
MCHC: 33.1 g/dL (ref 32.0–36.0)
MCV: 93.1 fL (ref 80.0–100.0)
MPV: 10.9 fL (ref 7.5–12.5)
Monocytes Absolute: 531 cells/uL (ref 200–950)
Monocytes Relative: 9 %
Neutro Abs: 2596 cells/uL (ref 1500–7800)
Neutrophils Relative %: 44 %
PLATELETS: 195 10*3/uL (ref 140–400)
RBC: 3.76 MIL/uL — ABNORMAL LOW (ref 3.80–5.10)
RDW: 13.7 % (ref 11.0–15.0)
WBC: 5.9 10*3/uL (ref 3.8–10.8)

## 2016-04-30 LAB — COMPLETE METABOLIC PANEL WITH GFR
ALT: 26 U/L (ref 6–29)
AST: 25 U/L (ref 10–35)
Albumin: 3.9 g/dL (ref 3.6–5.1)
Alkaline Phosphatase: 57 U/L (ref 33–130)
BUN: 17 mg/dL (ref 7–25)
CO2: 29 mmol/L (ref 20–31)
Calcium: 9.5 mg/dL (ref 8.6–10.4)
Chloride: 102 mmol/L (ref 98–110)
Creat: 0.58 mg/dL (ref 0.50–1.05)
GFR, Est African American: >89 mL/min (ref >=60)
GLUCOSE: 102 mg/dL — AB (ref 65–99)
POTASSIUM: 3.9 mmol/L (ref 3.5–5.3)
SODIUM: 139 mmol/L (ref 135–146)
Total Bilirubin: 0.3 mg/dL (ref 0.2–1.2)
Total Protein: 7.1 g/dL (ref 6.1–8.1)

## 2016-04-30 LAB — THYROID PANEL WITH TSH
Free Thyroxine Index: 2.2 (ref 1.4–3.8)
T3 Uptake: 30 % (ref 22–35)
T4 TOTAL: 7.2 ug/dL (ref 4.5–12.0)
TSH: 0.38 m[IU]/L — AB

## 2016-04-30 LAB — LIPID PANEL
CHOL/HDL RATIO: 7.5 ratio — AB (ref <5.0)
Cholesterol: 187 mg/dL (ref <200)
HDL: 25 mg/dL — AB (ref >50)
LDL Cholesterol: 89 mg/dL (ref <100)
Triglycerides: 365 mg/dL — ABNORMAL HIGH (ref <150)
VLDL: 73 mg/dL — AB (ref <30)

## 2016-05-01 ENCOUNTER — Other Ambulatory Visit: Payer: Self-pay | Admitting: Osteopathic Medicine

## 2016-05-13 ENCOUNTER — Other Ambulatory Visit: Payer: Self-pay

## 2016-05-13 MED ORDER — LISINOPRIL-HYDROCHLOROTHIAZIDE 20-25 MG PO TABS: 1.0000 | ORAL_TABLET | Freq: Every day | ORAL | 0 refills | Status: DC

## 2016-05-13 NOTE — Telephone Encounter (Signed)
PATIENT REQUEST REFILL FOR lISINOPRIL 20/25 MG. #90 0 REFILLS SENT TO [facility]. Rhonda Cunningham,CMA

## 2016-07-21 ENCOUNTER — Other Ambulatory Visit: Payer: Self-pay | Admitting: Osteopathic Medicine

## 2016-08-16 ENCOUNTER — Other Ambulatory Visit: Payer: Self-pay

## 2016-08-16 MED ORDER — LEVOTHYROXINE SODIUM 175 MCG PO TABS: 175.0000 ug | ORAL_TABLET | Freq: Every day | ORAL | 0 refills | Status: DC

## 2016-08-16 NOTE — Progress Notes (Signed)
Sent in 30 day supply for levothyroxine. Patient is on vacation and left her medication at home.

## 2016-09-17 ENCOUNTER — Ambulatory Visit: Payer: Self-pay | Admitting: Osteopathic Medicine

## 2016-09-24 ENCOUNTER — Encounter: Payer: Self-pay | Admitting: Osteopathic Medicine

## 2016-09-24 ENCOUNTER — Encounter (INDEPENDENT_AMBULATORY_CARE_PROVIDER_SITE_OTHER): Payer: Self-pay

## 2016-09-24 ENCOUNTER — Ambulatory Visit (INDEPENDENT_AMBULATORY_CARE_PROVIDER_SITE_OTHER): Payer: 59 | Admitting: Osteopathic Medicine

## 2016-09-24 VITALS — BP 113/74 | HR 72 | Ht 60.0 in | Wt 181.0 lb

## 2016-09-24 DIAGNOSIS — R7301 Impaired fasting glucose: Secondary | ICD-10-CM

## 2016-09-24 DIAGNOSIS — E039 Hypothyroidism, unspecified: Secondary | ICD-10-CM | POA: Diagnosis not present

## 2016-09-24 DIAGNOSIS — E782 Mixed hyperlipidemia: Secondary | ICD-10-CM | POA: Diagnosis not present

## 2016-09-24 DIAGNOSIS — Z853 Personal history of malignant neoplasm of breast: Secondary | ICD-10-CM

## 2016-09-24 DIAGNOSIS — Z0000 Encounter for general adult medical examination without abnormal findings: Secondary | ICD-10-CM

## 2016-09-24 DIAGNOSIS — I10 Essential (primary) hypertension: Secondary | ICD-10-CM

## 2016-09-24 DIAGNOSIS — Z1231 Encounter for screening mammogram for malignant neoplasm of breast: Secondary | ICD-10-CM | POA: Diagnosis not present

## 2016-09-24 DIAGNOSIS — Z1239 Encounter for other screening for malignant neoplasm of breast: Secondary | ICD-10-CM

## 2016-09-24 MED ORDER — LISINOPRIL-HYDROCHLOROTHIAZIDE 20-25 MG PO TABS: 1.0000 | ORAL_TABLET | Freq: Every day | ORAL | 3 refills | Status: DC

## 2016-09-24 NOTE — Progress Notes (Signed)
HPI: Mariah Kemp is a 56 y.o. female  who presents to [place] today, 09/24/16,  for chief complaint of:  Chief Complaint  Patient presents with  . Follow-up    BLODO PRESSURE AND THYROID    Patient here for annual physical / wellness exam.  See preventive care reviewed as below.  Recent labs reviewed in detail with the patient.   Additional concerns today include:   Hypertension: Blood pressure under good control, little bit on the low side but no symptoms of dizziness, fatigue. No home blood pressures to report.  Hypothyroid: Due for TSH recheck, patient has been reluctant to change dose of medications in the past, TSH levels have been low past few measurements.    Past medical, surgical, social and family history reviewed: Patient Active Problem List   Diagnosis Date Noted  . Cold sore 04/02/2016  . Nasal polyposis 09/07/2014  . Hypertriglyceridemia 08/24/2014  . Hypothyroidism 08/24/2014  . History of breast cancer 08/19/2014  . Chronic bronchitis ([place]) 05/31/2014  . Essential hypertension 05/31/2014   Past Surgical History:  Procedure Laterality Date  . ABDOMINAL HYSTERECTOMY  1996  . CARPAL TUNNEL RELEASE    . MASTECTOMY  2007   Social History  Substance Use Topics  . Smoking status: Former Smoker    Quit date: 04/27/1994  . Smokeless tobacco: Never Used  . Alcohol use No   Family History  Problem Relation Age of Onset  . Cancer Father        liver CA     Current medication list and allergy/intolerance information reviewed:   Current Outpatient Prescriptions  Medication Sig Dispense Refill  . acyclovir (ZOVIRAX) 400 MG tablet Take 1 tablet (400 mg total) by mouth 3 (three) times daily. For five days. As needed for cold sore. 15 tablet 2  . levothyroxine (SYNTHROID, LEVOTHROID) 175 MCG tablet Take 1 tablet (175 mcg total) by mouth daily before breakfast. 30 tablet 0  . lisinopril-hydrochlorothiazide  (PRINZIDE,ZESTORETIC) 20-25 MG tablet TAKE 1 TABLET BY MOUTH ONCE DAILY 90 tablet 0  . Tiotropium Bromide-Olodaterol (STIOLTO RESPIMAT) 2.5-2.5 MCG/ACT AERS Inhale 2 Units into the lungs daily. 4 Inhaler 0   No current facility-administered medications for this visit.    No Known Allergies    Review of Systems:  Constitutional:  No  fever, no chills, No recent illness  HEENT: No  headache, no vision change  Cardiac: No  chest pain, No  pressure  Respiratory:  No  shortness of breath. No  Cough  Gastrointestinal: No  abdominal pain, No  nausea  Musculoskeletal: No new myalgia/arthralgia  Skin: No  Rash  Neurologic: No  weakness, No  dizziness  Psychiatric: No  concerns with depression, No  concerns with anxiety  Exam:  BP 113/74   Pulse 72   Ht 5' (1.524 m)   Wt 181 lb (82.1 kg)   BMI 35.35 kg/m   Constitutional: VS see above. General Appearance: alert, well-developed, well-nourished, NAD  Eyes: Normal lids and conjunctive, non-icteric sclera  Ears, Nose, Mouth, Throat: MMM, Normal external inspection ears/nares/mouth/lips/gums.   Neck: No masses, trachea midline. No thyroid enlargement.   Respiratory: Normal respiratory effort. no wheeze, no rhonchi, no rales  Cardiovascular: S1/S2 normal, no murmur, no rub/gallop auscultated. RRR. No lower extremity edema.   Musculoskeletal: Gait normal. No clubbing/cyanosis of digits.   Neurological: Normal balance/coordination. No tremor.    Psychiatric: Normal judgment/insight. Normal mood and affect. Oriented x3.   ASSESSMENT/PLAN:   Annual  physical exam  Breast cancer screening - Plan: MM DIGITAL SCREENING BILATERAL  Essential hypertension - Plan: lisinopril-hydrochlorothiazide (PRINZIDE,ZESTORETIC) 20-25 MG tablet  Hypothyroidism, unspecified type - Plan: TSH  Elevated fasting glucose - Plan: Hemoglobin A1c  Mixed hyperlipidemia - lifestyle modifications discussed   History of breast cancer in female     FEMALE PREVENTIVE CARE Updated 09/24/16   ANNUAL SCREENING/COUNSELING  Diet/Exercise - HEALTHY HABITS DISCUSSED TO DECREASE CV RISK History  Smoking Status  . Former Smoker  . Quit date: 04/27/1994  Smokeless Tobacco  . Never Used   History  Alcohol Use No  NONE No flowsheet data found.  Domestic violence concerns - no  HTN SCREENING - SEE VITALS  SEXUAL HEALTH  Need/want STI testing today? - no  Concerns about libido or pain with sex? - no  Plans for pregnancy? - N/A  INFECTIOUS DISEASE SCREENING  HIV - does not need  GC/CT - does not need  HepC - DOB [birth year] - does not need  TB - does not need  DISEASE SCREENING  Lipid - does not need  DM2 - does not need  Osteoporosis - women age 61+ - does not need  CANCER SCREENING  Cervical - does not need  Breast - needs  Lung - does not need  Colon - does not need  ADULT VACCINATION  Influenza - annual vaccine recommended  Td - booster every 10 years   Zoster - option at 66, yes at 60+   PCV13 - was not indicated  PPSV23 - was not indicated   Visit summary with medication list and pertinent instructions was printed for patient to review. All questions at time of visit were answered - patient instructed to contact office with any additional concerns. ER/RTC precautions were reviewed with the patient. Follow-up plan: Return in about 6 months (around 03/27/2017) for FOLLOW UP TO MONITOR BLOOD PRESSURE, SOONER IF NEEDED .

## 2016-09-25 LAB — TSH: TSH: 0.04 m[IU]/L — AB

## 2016-09-25 LAB — HEMOGLOBIN A1C
Hgb A1c MFr Bld: 5.6 % (ref <5.7)
Mean Plasma Glucose: 114 mg/dL

## 2016-09-25 MED ORDER — LEVOTHYROXINE SODIUM 150 MCG PO TABS: 150.0000 ug | ORAL_TABLET | Freq: Every day | ORAL | 1 refills | Status: DC

## 2016-09-25 NOTE — Addendum Note (Signed)
Addended by: Maryla Morrow on: 09/25/2016 01:07 PM   Modules accepted: Orders

## 2016-10-08 ENCOUNTER — Ambulatory Visit: Payer: 59

## 2016-10-18 ENCOUNTER — Ambulatory Visit: Payer: 59

## 2016-10-30 ENCOUNTER — Ambulatory Visit (INDEPENDENT_AMBULATORY_CARE_PROVIDER_SITE_OTHER): Payer: 59

## 2016-10-30 ENCOUNTER — Ambulatory Visit: Payer: 59

## 2016-10-30 DIAGNOSIS — Z1231 Encounter for screening mammogram for malignant neoplasm of breast: Secondary | ICD-10-CM

## 2017-01-09 ENCOUNTER — Telehealth: Payer: Self-pay | Admitting: Osteopathic Medicine

## 2017-01-09 DIAGNOSIS — R062 Wheezing: Secondary | ICD-10-CM

## 2017-01-09 MED ORDER — TIOTROPIUM BROMIDE-OLODATEROL 2.5-2.5 MCG/ACT IN AERS: 2.0000 [IU] | INHALATION_SPRAY | Freq: Every day | RESPIRATORY_TRACT | 3 refills | Status: DC

## 2017-01-09 NOTE — Telephone Encounter (Signed)
APproved

## 2017-01-09 NOTE — Telephone Encounter (Signed)
Pt advised.

## 2017-01-27 ENCOUNTER — Ambulatory Visit (INDEPENDENT_AMBULATORY_CARE_PROVIDER_SITE_OTHER): Payer: 59 | Admitting: Osteopathic Medicine

## 2017-01-27 ENCOUNTER — Encounter: Payer: Self-pay | Admitting: Osteopathic Medicine

## 2017-01-27 VITALS — BP 104/69 | HR 98 | Ht 60.0 in | Wt 167.0 lb

## 2017-01-27 DIAGNOSIS — E039 Hypothyroidism, unspecified: Secondary | ICD-10-CM

## 2017-01-27 MED ORDER — LEVOTHYROXINE SODIUM 150 MCG PO TABS: 150.0000 ug | ORAL_TABLET | Freq: Every day | ORAL | 0 refills | Status: DC

## 2017-01-27 NOTE — Progress Notes (Signed)
HPI: Mariah Kemp is a 56 y.o. female who  has a past medical history of Hypertension and Thyroid disease.  she presents to [facility] today, 01/27/17,  for chief complaint of:  Chief Complaint  Patient presents with  . Follow-up    LABS AND DISCUSS SYNTHROID    Due for blood work to recheck thyroid, medications were adjusted a few months ago. Patient is reluctant to decrease dose of medication again even than previous TSH levels were up. We spent some time going over this, see assessment/plan. She is feeling well in terms of energy levels, no more hair loss or skin changes. Minimal fatigue    Past medical, surgical, social and family history reviewed:  Patient Active Problem List   Diagnosis Date Noted  . Mixed hyperlipidemia 09/24/2016  . Elevated fasting glucose 09/24/2016  . Cold sore 04/02/2016  . Nasal polyposis 09/07/2014  . Hypertriglyceridemia 08/24/2014  . Hypothyroidism 08/24/2014  . History of breast cancer 08/19/2014  . Chronic bronchitis ([place]) 05/31/2014  . Essential hypertension 05/31/2014    Past Surgical History:  Procedure Laterality Date  . ABDOMINAL HYSTERECTOMY  1996  . CARPAL TUNNEL RELEASE    . MASTECTOMY  2007    Social History   Tobacco Use  . Smoking status: Former Smoker    Last attempt to quit: 04/27/1994    Years since quitting: 22.7  . Smokeless tobacco: Never Used  Substance Use Topics  . Alcohol use: No    Alcohol/week: 0.0 oz    Family History  Problem Relation Age of Onset  . Cancer Father        liver CA     Current medication list and allergy/intolerance information reviewed:    Current Outpatient Medications  Medication Sig Dispense Refill  . acyclovir (ZOVIRAX) 400 MG tablet Take 1 tablet (400 mg total) by mouth 3 (three) times daily. For five days. As needed for cold sore. 15 tablet 2  . levothyroxine (SYNTHROID, LEVOTHROID) 150 MCG tablet Take 1 tablet (150 mcg total) by mouth daily  before breakfast. 60 tablet 1  . lisinopril-hydrochlorothiazide (PRINZIDE,ZESTORETIC) 20-25 MG tablet Take 1 tablet by mouth daily. 90 tablet 3  . Tiotropium Bromide-Olodaterol (STIOLTO RESPIMAT) 2.5-2.5 MCG/ACT AERS Inhale 2 Units into the lungs daily. 4 Inhaler 3   No current facility-administered medications for this visit.     No Known Allergies    Review of Systems:  Constitutional:  No  fever, no chills, No recent illness  HEENT: No  headache, no vision change  Cardiac: No  chest pain, No  pressure, No palpitations  Respiratory:  No  shortness of breath.   Gastrointestinal: No  abdominal pain, No  nausea  Musculoskeletal: No new myalgia/arthralgia  Skin: No  Rash  Endocrine: No cold intolerance,  No heat intolerance.  Neurologic: No  weakness, No  dizziness  Psychiatric: No  concerns with depression, No  concerns with anxiety, No sleep problems, No mood problems  Exam:  BP 104/69   Pulse 98   Ht 5' (1.524 m)   Wt 167 lb (75.8 kg)   BMI 32.61 kg/m   Constitutional: VS see above. General Appearance: alert, well-developed, well-nourished, NAD  Eyes: Normal lids and conjunctive, non-icteric sclera  Ears, Nose, Mouth, Throat: MMM, Normal external inspection ears/nares/mouth/lips/gums.   Neck: No masses, trachea midline. No thyroid enlargement. No tenderness/mass appreciated. No lymphadenopathy  Respiratory: Normal respiratory effort. no wheeze, no rhonchi, no rales  Cardiovascular: S1/S2 normal, no murmur, no  rub/gallop auscultated. RRR.   Musculoskeletal: Gait normal.   Neurological: Normal balance/coordination. No tremor.   Skin: warm, dry, intact. No rash/ulcer.   Psychiatric: Normal judgment/insight. Normal mood and affect. Oriented x3.      ASSESSMENT/PLAN:   Hypothyroidism, unspecified type - Spent some time discussing thyroid/pituitary axis in terms of TSH versus T3/T4 labs, patient has better understanding now of medication adjustments and why  thes - Plan: TSH     Visit summary with medication list and pertinent instructions was printed for patient to review. All questions at time of visit were answered - patient instructed to contact office with any additional concerns. ER/RTC precautions were reviewed with the patient. Follow-up plan: Return for lung function testing next few weeks .  Note: Total time spent 15 minutes, greater than 50% of the visit was spent face-to-face counseling and coordinating care for the following: The encounter diagnosis was Hypothyroidism, unspecified type..  Please note: voice recognition software was used to produce this document, and typos may escape review. Please contact Dr. Sheppard Coil for any needed clarifications.

## 2017-01-28 ENCOUNTER — Encounter: Payer: Self-pay | Admitting: Osteopathic Medicine

## 2017-01-31 LAB — TSH: TSH: 1.13 mIU/L

## 2017-02-05 ENCOUNTER — Ambulatory Visit (INDEPENDENT_AMBULATORY_CARE_PROVIDER_SITE_OTHER): Payer: 59 | Admitting: Osteopathic Medicine

## 2017-02-05 VITALS — BP 102/53 | HR 61 | Wt 164.0 lb

## 2017-02-05 DIAGNOSIS — R053 Chronic cough: Secondary | ICD-10-CM

## 2017-02-05 DIAGNOSIS — R05 Cough: Secondary | ICD-10-CM | POA: Diagnosis not present

## 2017-02-05 MED ORDER — AEROCHAMBER PLUS MISC: 2 refills | Status: DC

## 2017-02-05 MED ORDER — OMEPRAZOLE 20 MG PO CPDR: 20.0000 mg | DELAYED_RELEASE_CAPSULE | Freq: Every day | ORAL | 0 refills | Status: DC

## 2017-02-05 MED ORDER — ALBUTEROL SULFATE (2.5 MG/3ML) 0.083% IN NEBU
2.5000 mg | INHALATION_SOLUTION | Freq: Once | RESPIRATORY_TRACT | Status: AC
  Administered 2017-02-05: 2.5 mg via RESPIRATORY_TRACT

## 2017-02-05 MED ORDER — LEVOTHYROXINE SODIUM 150 MCG PO TABS: 150.0000 ug | ORAL_TABLET | Freq: Every day | ORAL | 3 refills | Status: DC

## 2017-02-05 MED ORDER — SPACER/AERO CHAMBER MOUTHPIECE MISC: 99 refills | Status: DC

## 2017-02-05 MED ORDER — ALBUTEROL SULFATE HFA 108 (90 BASE) MCG/ACT IN AERS
2.0000 | INHALATION_SPRAY | RESPIRATORY_TRACT | 1 refills | Status: DC | PRN
Start: 2017-02-05 — End: 2017-07-04

## 2017-02-05 MED ORDER — FLUTICASONE PROPIONATE HFA 110 MCG/ACT IN AERO: 2.0000 | INHALATION_SPRAY | Freq: Two times a day (BID) | RESPIRATORY_TRACT | 1 refills | Status: DC

## 2017-02-05 NOTE — Patient Instructions (Addendum)
Plan:   Persistent cough, only at nighttime and waking from sleep: Have a few ideas.  If we are looking at a respiratory ear attendant that is causing an asthma-type picture, I think we should switch the inhaler to a daily steroid inhaler and see if this prevents the nighttime awakenings. The rescue inhaler I will switch to albuterol. Currently, the when you're taking is a maintenance inhaler for COPD, I'm not sure that this is actually helping in the middle of the night, it may be that getting up and taking deep breaths, coughing up the mucus, and sitting upright is a bit more helpful than the actual medicine. We'll see how the new inhalers work.  Is also worth considering acid reflux from the stomach as a cause of irritation/cough. Would consider Prilosec 20 mg daily for 6-8 weeks or so.  Would also consider restarting Flonase nasal spray and following up with ENT to discuss whether polyp removal may help with mucus production as well.  Let's try the new inhalers, antacids, and Flonase and recheck this issue again in another month

## 2017-02-05 NOTE — Progress Notes (Signed)
HPI: Mariah Kemp is a 56 y.o. female who  has a past medical history of Hypertension and Thyroid disease.  she presents to [facility] today, 02/05/17,  for chief complaint of:  Chief Complaint  Patient presents with  . Cough    Persisting cough, ongoing several months. Waking her up at night. She uses "rescue inhaler" and feels better, the only inhaler we have on her list is Stiolto, looks like this had been started a while back for presumed COPD. She states that when she takes the inhaler it definitely helps. She experiences no shortness of breath or coughing at other times.  Past medical, surgical, social and family history reviewed:  Patient Active Problem List   Diagnosis Date Noted  . Mixed hyperlipidemia 09/24/2016  . Elevated fasting glucose 09/24/2016  . Cold sore 04/02/2016  . Nasal polyposis 09/07/2014  . Hypertriglyceridemia 08/24/2014  . Hypothyroidism 08/24/2014  . History of breast cancer 08/19/2014  . Chronic bronchitis ([place]) 05/31/2014  . Essential hypertension 05/31/2014    Past Surgical History:  Procedure Laterality Date  . ABDOMINAL HYSTERECTOMY  1996  . CARPAL TUNNEL RELEASE    . MASTECTOMY  2007    Social History   Tobacco Use  . Smoking status: Former Smoker    Last attempt to quit: 04/27/1994    Years since quitting: 22.7  . Smokeless tobacco: Never Used  Substance Use Topics  . Alcohol use: No    Alcohol/week: 0.0 oz    Family History  Problem Relation Age of Onset  . Cancer Father        liver CA     Current medication list and allergy/intolerance information reviewed:    Current Outpatient Medications  Medication Sig Dispense Refill  . acyclovir (ZOVIRAX) 400 MG tablet Take 1 tablet (400 mg total) by mouth 3 (three) times daily. For five days. As needed for cold sore. 15 tablet 2  . levothyroxine (SYNTHROID, LEVOTHROID) 150 MCG tablet Take 1 tablet (150 mcg total) daily before breakfast by mouth.  30 tablet 0  . lisinopril-hydrochlorothiazide (PRINZIDE,ZESTORETIC) 20-25 MG tablet Take 1 tablet by mouth daily. 90 tablet 3  . Tiotropium Bromide-Olodaterol (STIOLTO RESPIMAT) 2.5-2.5 MCG/ACT AERS Inhale 2 Units into the lungs daily. 4 Inhaler 3   No current facility-administered medications for this visit.     No Known Allergies    Review of Systems:  Constitutional:  No  fever, no chills, No recent illness,  HEENT: No  headache, no vision change, no hearing change, No sore throat, No  sinus pressure  Cardiac: No  chest pain, No  pressure, No palpitations  Respiratory:  No  shortness of breath. +Cough  Gastrointestinal: No  abdominal pain, No  nausea, No  Vomiting, No GERD  Musculoskeletal: No new myalgia/arthralgia   Exam:  BP (!) 102/53   Pulse 61   Wt 164 lb (74.4 kg)   SpO2 97%   BMI 32.03 kg/m   Constitutional: VS see above. General Appearance: alert, well-developed, well-nourished, NAD  Eyes: Normal lids and conjunctive, non-icteric sclera  Ears, Nose, Mouth, Throat: MMM, Normal external inspection ears/nares/mouth/lips/gums.   Neck: No masses, trachea midline.   Respiratory: Normal respiratory effort. no wheeze, no rhonchi, no rales  Cardiovascular: S1/S2 normal, no murmur, no rub/gallop auscultated. RRR  Psychiatric: Normal judgment/insight. Normal mood and affect. Oriented x3.      ASSESSMENT/PLAN:   Chronic cough - Plan: Spirometry: Pre & Post Eval, albuterol (PROVENTIL) (2.5 MG/3ML) 0.083% nebulizer solution  2.5 mg   PFT INTERPRETATION  FEV1/FVC >70 *AND*  FEV1 >80% predicted  = NORMAL SPIROMETRY NORMAL? yes   Patient Instructions  Plan:   Persistent cough, only at nighttime and waking from sleep: Have a few ideas.  If we are looking at a respiratory ear attendant that is causing an asthma-type picture, I think we should switch the inhaler to a daily steroid inhaler and see if this prevents the nighttime awakenings. The rescue inhaler I  will switch to albuterol. Currently, the when you're taking is a maintenance inhaler for COPD, I'm not sure that this is actually helping in the middle of the night, it may be that getting up and taking deep breaths, coughing up the mucus, and sitting upright is a bit more helpful than the actual medicine. We'll see how the new inhalers work.  Is also worth considering acid reflux from the stomach as a cause of irritation/cough. Would consider Prilosec 20 mg daily for 6-8 weeks or so.  Would also consider restarting Flonase nasal spray and following up with ENT to discuss whether polyp removal may help with mucus production as well.  Let's try the new inhalers, antacids, and Flonase and recheck this issue again in another month   Meds ordered this encounter  Medications  . albuterol (PROVENTIL) (2.5 MG/3ML) 0.083% nebulizer solution 2.5 mg  . levothyroxine (SYNTHROID, LEVOTHROID) 150 MCG tablet    Sig: Take 1 tablet (150 mcg total) by mouth daily before breakfast.    Dispense:  90 tablet    Refill:  3    Please consider 90 day supplies to promote better adherence  . fluticasone (FLOVENT HFA) 110 MCG/ACT inhaler    Sig: Inhale 2 puffs into the lungs 2 (two) times daily. For cough, use with spacer    Dispense:  1 Inhaler    Refill:  1  . albuterol (PROVENTIL HFA;VENTOLIN HFA) 108 (90 Base) MCG/ACT inhaler    Sig: Inhale 2 puffs into the lungs every 4 (four) hours as needed for wheezing or shortness of breath.    Dispense:  2 Inhaler    Refill:  1  . omeprazole (PRILOSEC) 20 MG capsule    Sig: Take 1 capsule (20 mg total) by mouth daily.    Dispense:  90 capsule    Refill:  0  . DISCONTD: Spacer/Aero Chamber Mouthpiece MISC    Sig: As directed with inhalers    Dispense:  1 each    Refill:  prn  . Spacer/Aero-Holding Chambers (AEROCHAMBER PLUS) inhaler    Sig: Use as instructed    Dispense:  1 each    Refill:  2  . DISCONTD: Spacer/Aero Chamber Mouthpiece MISC    Sig: As directed  with inhalers    Dispense:  1 each    Refill:  prn  . DISCONTD: Spacer/Aero Chamber Mouthpiece MISC    Sig: As directed with inhalers    Dispense:  1 each    Refill:  prn  . Spacer/Aero Chamber Mouthpiece MISC    Sig: As directed with inhalers    Dispense:  1 each    Refill:  prn     Visit summary with medication list and pertinent instructions was printed for patient to review. All questions at time of visit were answered - patient instructed to contact office with any additional concerns. ER/RTC precautions were reviewed with the patient. Follow-up plan: Return in about 4 weeks (around 03/05/2017) for recheck cough, sooner if needed.  Note: Total time spent  25 minutes, greater than 50% of the visit was spent face-to-face counseling and coordinating care for the following: The encounter diagnosis was Chronic cough..  Please note: voice recognition software was used to produce this document, and typos may escape review. Please contact Dr. Sheppard Coil for any needed clarifications.

## 2017-07-04 ENCOUNTER — Other Ambulatory Visit: Payer: Self-pay | Admitting: Osteopathic Medicine

## 2017-08-06 ENCOUNTER — Other Ambulatory Visit: Payer: Self-pay | Admitting: Osteopathic Medicine

## 2017-09-16 ENCOUNTER — Ambulatory Visit (INDEPENDENT_AMBULATORY_CARE_PROVIDER_SITE_OTHER): Payer: 59 | Admitting: Osteopathic Medicine

## 2017-09-16 ENCOUNTER — Encounter: Payer: Self-pay | Admitting: Osteopathic Medicine

## 2017-09-16 VITALS — BP 97/54 | HR 74 | Temp 98.0°F | Wt 153.5 lb

## 2017-09-16 DIAGNOSIS — IMO0001 Reserved for inherently not codable concepts without codable children: Secondary | ICD-10-CM

## 2017-09-16 DIAGNOSIS — R062 Wheezing: Secondary | ICD-10-CM | POA: Diagnosis not present

## 2017-09-16 DIAGNOSIS — I10 Essential (primary) hypertension: Secondary | ICD-10-CM

## 2017-09-16 DIAGNOSIS — R053 Chronic cough: Secondary | ICD-10-CM

## 2017-09-16 DIAGNOSIS — J4520 Mild intermittent asthma, uncomplicated: Secondary | ICD-10-CM | POA: Diagnosis not present

## 2017-09-16 DIAGNOSIS — R05 Cough: Secondary | ICD-10-CM

## 2017-09-16 DIAGNOSIS — E039 Hypothyroidism, unspecified: Secondary | ICD-10-CM

## 2017-09-16 MED ORDER — ALBUTEROL SULFATE HFA 108 (90 BASE) MCG/ACT IN AERS: 1.0000 | INHALATION_SPRAY | RESPIRATORY_TRACT | 3 refills | Status: DC | PRN

## 2017-09-16 MED ORDER — HYDROCHLOROTHIAZIDE 25 MG PO TABS: 25.0000 mg | ORAL_TABLET | Freq: Every day | ORAL | 1 refills | Status: DC

## 2017-09-16 MED ORDER — LEVOTHYROXINE SODIUM 150 MCG PO TABS: 150.0000 ug | ORAL_TABLET | Freq: Every day | ORAL | 3 refills | Status: DC

## 2017-09-16 MED ORDER — BUDESONIDE-FORMOTEROL FUMARATE 160-4.5 MCG/ACT IN AERO: 2.0000 | INHALATION_SPRAY | Freq: Two times a day (BID) | RESPIRATORY_TRACT | 3 refills | Status: DC

## 2017-09-16 NOTE — Patient Instructions (Addendum)
Plan:  Try Symbicort inhaler twice per day for maintenance/prevention of cough/wheezing  Continue Albuterol as needed  Lower BP meds to just HCTZ and recheck BP and breathing in 2-4 weeks   Labs prior to your follow-up (fasting - lab opens at 7:30 AM)

## 2017-09-16 NOTE — Progress Notes (Signed)
HPI: Mariah Kemp is a 57 y.o. female who  has a past medical history of Hypertension and Thyroid disease.  she presents to [facility] today, 09/16/17,  for chief complaint of:  Asthma  Noticing coughing/wheezing a little bit more lately.  Is having to use her rescue inhaler at least once a day, sometimes multiple times per day.  No nighttime awakenings.  Pulmonary function test was actually normal, but the patient states that she took her inhalers prior to the test, so probably invalid result, inaccurate pre-/post  Hypertension: Blood pressure actually a bit on the low side today.  No dizziness/LOC.  Patient would like to know if she can maybe back off a bit on the blood pressure medication.  Past medical history, surgical history, and family history reviewed.  Current medication list and allergy/intolerance information reviewed.   (See remainder of HPI, ROS, Phys Exam below)    ASSESSMENT/PLAN: The primary encounter diagnosis was Moderate intermittent asthma without complication. Diagnoses of Essential hypertension, Hypothyroidism, unspecified type, Wheezing, and Chronic cough were also pertinent to this visit.    Meds ordered this encounter  Medications  . budesonide-formoterol (SYMBICORT) 160-4.5 MCG/ACT inhaler    Sig: Inhale 2 puffs into the lungs 2 (two) times daily.    Dispense:  2 Inhaler    Refill:  3  . albuterol (PROVENTIL HFA;VENTOLIN HFA) 108 (90 Base) MCG/ACT inhaler    Sig: Inhale 1-2 puffs into the lungs every 4 (four) hours as needed for wheezing or shortness of breath.    Dispense:  2 Inhaler    Refill:  3    Patient Instructions  Plan:  Try Symbicort inhaler twice per day for maintenance/prevention of cough/wheezing  Continue Albuterol as needed  Lower BP meds to just HCTZ and recheck BP and breathing in 2-4 weeks   Labs prior to your follow-up (fasting - lab opens at 7:30 AM)    Follow-up plan: Return in about 1  month (around 10/14/2017) for recheck breathing and BP, sooner fi needed.     ############################################ ############################################ ############################################ ############################################    Outpatient Encounter Medications as of 09/16/2017  Medication Sig Note  . acyclovir (ZOVIRAX) 400 MG tablet Take 1 tablet (400 mg total) by mouth 3 (three) times daily. For five days. As needed for cold sore. 09/16/2017: PRN  . albuterol (PROVENTIL HFA;VENTOLIN HFA) 108 (90 Base) MCG/ACT inhaler Inhale 1-2 puffs into the lungs every 4 (four) hours as needed for wheezing or shortness of breath.   . levothyroxine (SYNTHROID, LEVOTHROID) 150 MCG tablet Take 1 tablet (150 mcg total) by mouth daily before breakfast.   . lisinopril-hydrochlorothiazide (PRINZIDE,ZESTORETIC) 20-25 MG tablet Take 1 tablet by mouth daily.   Marland Kitchen omeprazole (PRILOSEC) 20 MG capsule Take 1 capsule (20 mg total) by mouth daily.   . [DISCONTINUED] albuterol (PROVENTIL HFA;VENTOLIN HFA) 108 (90 Base) MCG/ACT inhaler PLEASE SEE ATTACHED FOR DETAILED DIRECTIONS   . budesonide-formoterol (SYMBICORT) 160-4.5 MCG/ACT inhaler Inhale 2 puffs into the lungs 2 (two) times daily.   . [DISCONTINUED] fluticasone (FLOVENT HFA) 110 MCG/ACT inhaler Inhale 2 puffs into the lungs 2 (two) times daily. For cough, use with spacer (Patient not taking: Reported on 09/16/2017)   . [DISCONTINUED] Spacer/Aero Chamber Mouthpiece MISC As directed with inhalers (Patient not taking: Reported on 09/16/2017)   . [DISCONTINUED] Spacer/Aero-Holding Chambers (AEROCHAMBER PLUS) inhaler Use as instructed (Patient not taking: Reported on 09/16/2017)    No facility-administered encounter medications on file as of 09/16/2017.    No  Known Allergies    Review of Systems:  Constitutional: No recent illness  HEENT: No  headache, no vision change  Cardiac: No  chest pain, No  pressure, No  palpitations  Respiratory:  +shortness of breath. +Cough  Neurologic: No  weakness, No  Dizziness   Exam:  BP (!) 97/54 (BP Location: Left Arm, Patient Position: Sitting, Cuff Size: Normal)   Pulse 74   Temp 98 F (36.7 C) (Oral)   Wt 153 lb 8 oz (69.6 kg)   BMI 29.98 kg/m   Constitutional: VS see above. General Appearance: alert, well-developed, well-nourished, NAD  Eyes: Normal lids and conjunctive, non-icteric sclera  Ears, Nose, Mouth, Throat: MMM, Normal external inspection ears/nares/mouth/lips/gums.  Neck: No masses, trachea midline.   Respiratory: Normal respiratory effort. +diffuse wheeze, no rhonchi, no rales  Cardiovascular: S1/S2 normal, no murmur, no rub/gallop auscultated. RRR.   Musculoskeletal: Gait normal. Symmetric and independent movement of all extremities  Neurological: Normal balance/coordination. No tremor.  Skin: warm, dry, intact.   Psychiatric: Normal judgment/insight. Normal mood and affect. Oriented x3.   Visit summary with medication list and pertinent instructions was printed for patient to review, advised to alert [place] if any changes needed. All questions at time of visit were answered - patient instructed to contact office with any additional concerns. ER/RTC precautions were reviewed with the patient and understanding verbalized.   Follow-up plan: Return in about 1 month (around 10/14/2017) for recheck breathing and BP, sooner fi needed.    Please note: voice recognition software was used to produce this document, and typos may escape review. Please contact Dr. Sheppard Coil for any needed clarifications.

## 2017-09-17 ENCOUNTER — Encounter: Payer: Self-pay | Admitting: Osteopathic Medicine

## 2017-09-17 DIAGNOSIS — J45909 Unspecified asthma, uncomplicated: Secondary | ICD-10-CM | POA: Insufficient documentation

## 2017-09-17 DIAGNOSIS — J4520 Mild intermittent asthma, uncomplicated: Secondary | ICD-10-CM | POA: Insufficient documentation

## 2017-10-08 LAB — COMPLETE METABOLIC PANEL WITH GFR
AG RATIO: 1.2 (calc) (ref 1.0–2.5)
ALBUMIN MSPROF: 3.9 g/dL (ref 3.6–5.1)
ALT: 15 U/L (ref 6–29)
AST: 18 U/L (ref 10–35)
Alkaline phosphatase (APISO): 54 U/L (ref 33–130)
BILIRUBIN TOTAL: 0.3 mg/dL (ref 0.2–1.2)
BUN: 24 mg/dL (ref 7–25)
CHLORIDE: 102 mmol/L (ref 98–110)
CO2: 29 mmol/L (ref 20–32)
Calcium: 9.5 mg/dL (ref 8.6–10.4)
Creat: 0.7 mg/dL (ref 0.50–1.05)
GFR, EST AFRICAN AMERICAN: 113 mL/min/{1.73_m2} (ref >=60)
GFR, Est Non African American: 98 mL/min/{1.73_m2} (ref >=60)
GLOBULIN: 3.2 g/dL (ref 1.9–3.7)
GLUCOSE: 100 mg/dL — AB (ref 65–99)
POTASSIUM: 3.8 mmol/L (ref 3.5–5.3)
SODIUM: 137 mmol/L (ref 135–146)
TOTAL PROTEIN: 7.1 g/dL (ref 6.1–8.1)

## 2017-10-08 LAB — CBC
HCT: 30.2 % — ABNORMAL LOW (ref 35.0–45.0)
Hemoglobin: 10.4 g/dL — ABNORMAL LOW (ref 11.7–15.5)
MCH: 31 pg (ref 27.0–33.0)
MCHC: 34.4 g/dL (ref 32.0–36.0)
MCV: 89.9 fL (ref 80.0–100.0)
MPV: 11.5 fL (ref 7.5–12.5)
PLATELETS: 195 10*3/uL (ref 140–400)
RBC: 3.36 10*6/uL — AB (ref 3.80–5.10)
RDW: 13.3 % (ref 11.0–15.0)
WBC: 5.9 10*3/uL (ref 3.8–10.8)

## 2017-10-08 LAB — TSH: TSH: 0.42 m[IU]/L

## 2017-10-08 LAB — LIPID PANEL
Cholesterol: 194 mg/dL
HDL: 38 mg/dL — ABNORMAL LOW
LDL Cholesterol (Calc): 124 mg/dL — ABNORMAL HIGH
Non-HDL Cholesterol (Calc): 156 mg/dL — ABNORMAL HIGH
Total CHOL/HDL Ratio: 5.1 (calc) — ABNORMAL HIGH
Triglycerides: 208 mg/dL — ABNORMAL HIGH

## 2017-12-02 ENCOUNTER — Other Ambulatory Visit: Payer: Self-pay | Admitting: Osteopathic Medicine

## 2018-02-24 ENCOUNTER — Other Ambulatory Visit: Payer: Self-pay

## 2018-02-24 MED ORDER — ACYCLOVIR 400 MG PO TABS: 400.0000 mg | ORAL_TABLET | Freq: Three times a day (TID) | ORAL | 0 refills | Status: DC

## 2018-03-19 ENCOUNTER — Emergency Department (INDEPENDENT_AMBULATORY_CARE_PROVIDER_SITE_OTHER): Payer: 59

## 2018-03-19 ENCOUNTER — Other Ambulatory Visit: Payer: Self-pay

## 2018-03-19 ENCOUNTER — Emergency Department (INDEPENDENT_AMBULATORY_CARE_PROVIDER_SITE_OTHER)
Admission: EM | Admit: 2018-03-19 | Discharge: 2018-03-19 | Disposition: A | Discharge status: Home / Self Care | Payer: 59 | Source: Home / Self Care | Attending: Family Medicine | Admitting: Family Medicine

## 2018-03-19 DIAGNOSIS — S20211A Contusion of right front wall of thorax, initial encounter: Secondary | ICD-10-CM

## 2018-03-19 DIAGNOSIS — R0789 Other chest pain: Secondary | ICD-10-CM

## 2018-03-19 DIAGNOSIS — R0781 Pleurodynia: Secondary | ICD-10-CM | POA: Diagnosis not present

## 2018-03-19 MED ORDER — HYDROCODONE-ACETAMINOPHEN 5-325 MG PO TABS: 1.0000 | ORAL_TABLET | Freq: Four times a day (QID) | ORAL | 0 refills | Status: DC | PRN

## 2018-03-19 NOTE — Discharge Instructions (Signed)
°  Norco/Vicodin (hydrocodone-acetaminophen) is a narcotic pain medication, do not combine these medications with others containing tylenol. While taking, do not drink alcohol, drive, or perform any other activities that requires focus while taking these medications.   Please follow up with family medicine in 1 week if needed.

## 2018-03-19 NOTE — ED Triage Notes (Signed)
Right before Christmas, a friend gave her a hug, and has been having chest/ rib pain since.  Pt had a mastectomy, and is not sure if there is something wrong with the "cage" they built around her chest.  The pain has progressively become worse since Christmas, and it is hard for her to lift, take deep breaths, and get comfortable.

## 2018-03-19 NOTE — ED Provider Notes (Signed)
[facility]    CSN: [id number] Arrival date & time: 03/19/18  1144     History   Chief Complaint Chief Complaint  Patient presents with  . Chest Pain    HPI Mariah Kemp is a 58 y.o. female.   HPI  Mariah Kemp is a 58 y.o. female presenting to UC with c/o Right side chest wall pain after an old friend gave her a bear hug right before Christmas. Pt had a mastectomy with reconstruction and is concerned something is wrong with the "cage" they build around her chest.  Pain is gradually worsening since Christmas.  Pain worse with certain movements, coughing, and certain positions, especially at night when trying to sleep.     Past Medical History:  Diagnosis Date  . Hypertension   . Thyroid disease     Patient Active Problem List   Diagnosis Date Noted  . Moderate intermittent asthma without complication 75/17/0017  . Mixed hyperlipidemia 09/24/2016  . Elevated fasting glucose 09/24/2016  . Cold sore 04/02/2016  . Nasal polyposis 09/07/2014  . Hypertriglyceridemia 08/24/2014  . Hypothyroidism 08/24/2014  . History of breast cancer 08/19/2014  . Chronic bronchitis ([place]) 05/31/2014  . Essential hypertension 05/31/2014    Past Surgical History:  Procedure Laterality Date  . ABDOMINAL HYSTERECTOMY  1996  . CARPAL TUNNEL RELEASE    . MASTECTOMY  2007    OB History   No obstetric history on file.      Home Medications    Prior to Admission medications   Medication Sig Start Date End Date Taking? Authorizing Provider  acyclovir (ZOVIRAX) 400 MG tablet Take 1 tablet (400 mg total) by mouth 3 (three) times daily. For five days. As needed for cold sore. 02/24/18   Emeterio Reeve, DO  albuterol (PROVENTIL HFA;VENTOLIN HFA) 108 (90 Base) MCG/ACT inhaler Inhale 1-2 puffs into the lungs every 4 (four) hours as needed for wheezing or shortness of breath. 09/16/17   Emeterio Reeve, DO  budesonide-formoterol [facility]) 160-4.5 MCG/ACT inhaler Inhale 2 puffs  into the lungs 2 (two) times daily. 09/16/17   Emeterio Reeve, DO  hydrochlorothiazide (HYDRODIURIL) 25 MG tablet TAKE 1 TABLET BY MOUTH ONCE DAILY 12/02/17   Emeterio Reeve, DO  HYDROcodone-acetaminophen (NORCO/VICODIN) 5-325 MG tablet Take 1-2 tablets by mouth every 6 (six) hours as needed. 03/19/18   Noe Gens, PA-C  levothyroxine (SYNTHROID, LEVOTHROID) 150 MCG tablet Take 1 tablet (150 mcg total) by mouth daily before breakfast. 09/16/17   Emeterio Reeve, DO  omeprazole (PRILOSEC) 20 MG capsule Take 1 capsule (20 mg total) by mouth daily. 02/05/17   Emeterio Reeve, DO    Family History Family History  Problem Relation Age of Onset  . Cancer Father        liver [place]    Social History Social History   Tobacco Use  . Smoking status: Former Smoker    Last attempt to quit: 04/27/1994    Years since quitting: 23.9  . Smokeless tobacco: Never Used  Substance Use Topics  . Alcohol use: No    Alcohol/week: 0.0 standard drinks  . Drug use: No     Allergies   Patient has no known allergies.   Review of Systems Review of Systems  Respiratory: Negative for cough and shortness of breath.   Cardiovascular: Positive for chest pain. Negative for palpitations.  Skin: Positive for color change. Negative for wound.     Physical Exam Triage Vital Signs ED Triage Vitals  Enc Vitals Group  BP 03/19/18 1228 122/78     Pulse Rate 03/19/18 1228 90     Resp 03/19/18 1228 18     Temp 03/19/18 1228 97.7 F (36.5 C)     Temp Source 03/19/18 1228 Oral     SpO2 03/19/18 1228 100 %     Weight 03/19/18 1230 167 lb (75.8 kg)     Height 03/19/18 1230 5' (1.524 m)     Head Circumference --      Peak Flow --      Pain Score 03/19/18 1229 6     Pain Loc --      Pain Edu? --      Excl. in [place]? --    No data found.  Updated Vital Signs BP 122/78 (BP Location: Right Arm)   Pulse 90   Temp 97.7 F (36.5 C) (Oral)   Resp 18   Ht 5' (1.524 m)   Wt 167 lb (75.8 kg)   SpO2  100%   BMI 32.61 kg/m   Visual Acuity Right Eye Distance:   Left Eye Distance:   Bilateral Distance:    Right Eye Near:   Left Eye Near:    Bilateral Near:     Physical Exam Vitals signs and nursing note reviewed.  Constitutional:      Appearance: She is well-developed.  HENT:     Head: Normocephalic and atraumatic.  Neck:     Musculoskeletal: Normal range of motion.  Cardiovascular:     Rate and Rhythm: Normal rate.  Pulmonary:     Effort: Pulmonary effort is normal.     Breath sounds: No decreased breath sounds, wheezing, rhonchi or rales.  Chest:    Musculoskeletal: Normal range of motion.  Skin:    General: Skin is warm and dry.  Neurological:     Mental Status: She is alert and oriented to person, place, and time.  Psychiatric:        Behavior: Behavior normal.      UC Treatments / Results  Labs (all labs ordered are listed, but only abnormal results are displayed) Labs Reviewed - No data to display  EKG None  Radiology Dg Ribs Unilateral W/chest Right  Result Date: 03/19/2018 CLINICAL DATA:  Right rib pain, bruising EXAM: RIGHT RIBS AND CHEST - 3+ VIEW COMPARISON:  04/27/2014 FINDINGS: Heart and mediastinal contours are within normal limits. No focal opacities or effusions. No acute bony abnormality. No visible fracture or focal rib lesion. Surgical clips in the right breast and axilla. IMPRESSION: No active cardiopulmonary disease. Electronically Signed   By: Rolm Baptise M.D.   On: 03/19/2018 12:43    Procedures Procedures (including critical care time)  Medications Ordered in UC Medications - No data to display  Initial Impression / Assessment and Plan / UC Course  I have reviewed the triage vital signs and the nursing notes.  Pertinent labs & imaging results that were available during my care of the patient were reviewed by me and considered in my medical decision making (see chart for details).     Reviewed imaging with pt Will tx with  norco Encouraged alternating ice and heat F/u with PCP as needed.  Final Clinical Impressions(s) / UC Diagnoses   Final diagnoses:  Right-sided chest wall pain  Contusion of right chest wall, initial encounter     Discharge Instructions      Norco/Vicodin (hydrocodone-acetaminophen) is a narcotic pain medication, do not combine these medications with others containing tylenol. While taking, do  not drink alcohol, drive, or perform any other activities that requires focus while taking these medications.   Please follow up with family medicine in 1 week if needed.    ED Prescriptions    Medication Sig Dispense Auth. Provider   HYDROcodone-acetaminophen (NORCO/VICODIN) 5-325 MG tablet Take 1-2 tablets by mouth every 6 (six) hours as needed. 12 tablet Noe Gens, PA-C     Controlled Substance Prescriptions [place] Controlled Substance Registry consulted? Not Applicable   Tyrell Antonio 03/19/18 1404

## 2018-03-27 ENCOUNTER — Encounter: Payer: Self-pay | Admitting: Sports Medicine

## 2018-03-27 ENCOUNTER — Ambulatory Visit (INDEPENDENT_AMBULATORY_CARE_PROVIDER_SITE_OTHER): Payer: 59 | Admitting: Sports Medicine

## 2018-03-27 VITALS — BP 121/71 | HR 106 | Ht 60.0 in | Wt 172.0 lb

## 2018-03-27 DIAGNOSIS — R000 Tachycardia, unspecified: Secondary | ICD-10-CM | POA: Diagnosis not present

## 2018-03-27 DIAGNOSIS — R002 Palpitations: Secondary | ICD-10-CM | POA: Diagnosis not present

## 2018-03-27 DIAGNOSIS — R0602 Shortness of breath: Secondary | ICD-10-CM

## 2018-03-27 DIAGNOSIS — D649 Anemia, unspecified: Secondary | ICD-10-CM

## 2018-03-27 NOTE — Progress Notes (Addendum)
Subjective:    CC: Palpitations  HPI: For the past 3 days this pleasant 58 year old female has noted increasing symptoms of her heart racing, palpitations, no overt chest pain.  She notes that when she bends forward or strains a bit it tends to stop.  This occurs almost every day, several times a day but is not occurring here in the office today.  She does get some headaches when going from a seated to a standing position quickly, no other symptoms, no shortness of breath, mild lower extremity swelling.  No skin rash, no other constitutional symptoms.  I reviewed the past medical history, family history, social history, surgical history, and allergies today and no changes were needed.  Please see the problem list section below in epic for further details.  Past Medical History: Past Medical History:  Diagnosis Date  . Hypertension   . Thyroid disease    Past Surgical History: Past Surgical History:  Procedure Laterality Date  . ABDOMINAL HYSTERECTOMY  1996  . CARPAL TUNNEL RELEASE    . MASTECTOMY  2007   Social History: Social History   Socioeconomic History  . Marital status: Married    Spouse name: Not on file  . Number of children: Not on file  . Years of education: Not on file  . Highest education level: Not on file  Occupational History  . Not on file  Social Needs  . Financial resource strain: Not on file  . Food insecurity:    Worry: Not on file    Inability: Not on file  . Transportation needs:    Medical: Not on file    Non-medical: Not on file  Tobacco Use  . Smoking status: Former Smoker    Last attempt to quit: 04/27/1994    Years since quitting: 23.9  . Smokeless tobacco: Never Used  Substance and Sexual Activity  . Alcohol use: No    Alcohol/week: 0.0 standard drinks  . Drug use: No  . Sexual activity: Never    Partners: Male  Lifestyle  . Physical activity:    Days per week: Not on file    Minutes per session: Not on file  . Stress: Not on file    Relationships  . Social connections:    Talks on phone: Not on file    Gets together: Not on file    Attends religious service: Not on file    Active member of club or organization: Not on file    Attends meetings of clubs or organizations: Not on file    Relationship status: Not on file  Other Topics Concern  . Not on file  Social History Narrative  . Not on file   Family History: Family History  Problem Relation Age of Onset  . Cancer Father        liver CA   Allergies: No Known Allergies Medications: See med rec.  Review of Systems: No fevers, chills, night sweats, weight loss, chest pain, or shortness of breath.   Objective:    General: Well Developed, well nourished, and in no acute distress.  Neuro: Alert and oriented x3, extra-ocular muscles intact, sensation grossly intact.  HEENT: Normocephalic, atraumatic, pupils equal round reactive to light, neck supple, no masses, no lymphadenopathy, thyroid nonpalpable.  Skin: Warm and dry, no rashes. Cardiac: Regular rate and rhythm, no rubs or gallops, 1/6 systolic murmur heard diffusely over the precordium, 1-2+ bilateral symmetric pitting lower extremity edema.  Respiratory: Clear to auscultation bilaterally. Not using accessory muscles,  speaking in full sentences.  Impression and Recommendations:    Palpitations Differential is very broad, I would suspect simple dehydration however she told me that she tends to Valsalva and her symptoms go away. Compression hose. Twelve-lead ECG normal today with the exception of sinus tachycardia. This may suggest a supraventricular source of her symptoms. CBC, CMP, TSH, d-dimer, cardiac enzymes, BNP. Echocardiogram. I would like her to wear a cardiac monitor for 72 hours at least. Hydrate aggressively. Return to see me in a week.  Critical results, severe anemia, she will need to proceed to the ED, will likely be admitted for more labs to determine cause of anemia (retic counts,  anemia panels), blood transfusion, and upper/lower endoscopy.  Spoke to on-call nurse, advised patient to proceed to a [facility] ED.  Severe anemia Critical results, severe anemia, she will need to proceed to the ED, will likely be admitted for more labs to determine cause of anemia (retic counts, anemia panels), blood transfusion, and upper/lower endoscopy.  Spoke to on-call nurse, advised patient to proceed to a [facility] ED.  Spoke to patient as well, she is OTW to [facility].  On further history, colonoscopy was a few years ago but she has been having some epigastric bloating.  Didn't think much and took some OTC antacids.  This likely indicates gastritis/PUD/upper GI source of the blood loss. ___________________________________________ Gwen Her. Dianah Field, M.D., ABFM., CAQSM. Primary Care and Sports Medicine [facility]  Adjunct Professor of [employer]

## 2018-03-27 NOTE — Assessment & Plan Note (Addendum)
Differential is very broad, I would suspect simple dehydration however she told me that she tends to Valsalva and her symptoms go away. Compression hose. Twelve-lead ECG normal today with the exception of sinus tachycardia. This may suggest a supraventricular source of her symptoms. CBC, CMP, TSH, d-dimer, cardiac enzymes, BNP. Echocardiogram. I would like her to wear a cardiac monitor for 72 hours at least. Hydrate aggressively. Return to see me in a week.  Critical results, severe anemia, she will need to proceed to the ED, will likely be admitted for more labs to determine cause of anemia (retic counts, anemia panels), blood transfusion, and upper/lower endoscopy.  Spoke to on-call nurse, advised patient to proceed to a [facility] ED.

## 2018-03-28 ENCOUNTER — Inpatient Hospital Stay (HOSPITAL_COMMUNITY)
Admission: EM | Admit: 2018-03-28 | Discharge: 2018-03-31 | DRG: 374 | Disposition: A | Discharge status: Home / Self Care | Payer: 59 | Attending: Internal Medicine | Admitting: Internal Medicine

## 2018-03-28 ENCOUNTER — Encounter (HOSPITAL_COMMUNITY): Payer: Self-pay | Admitting: Emergency Medicine

## 2018-03-28 ENCOUNTER — Telehealth: Payer: Self-pay | Admitting: Sports Medicine

## 2018-03-28 ENCOUNTER — Other Ambulatory Visit: Payer: Self-pay

## 2018-03-28 DIAGNOSIS — E781 Pure hyperglyceridemia: Secondary | ICD-10-CM | POA: Diagnosis present

## 2018-03-28 DIAGNOSIS — R42 Dizziness and giddiness: Secondary | ICD-10-CM | POA: Diagnosis present

## 2018-03-28 DIAGNOSIS — E782 Mixed hyperlipidemia: Secondary | ICD-10-CM | POA: Diagnosis present

## 2018-03-28 DIAGNOSIS — K922 Gastrointestinal hemorrhage, unspecified: Secondary | ICD-10-CM | POA: Diagnosis present

## 2018-03-28 DIAGNOSIS — Z79899 Other long term (current) drug therapy: Secondary | ICD-10-CM

## 2018-03-28 DIAGNOSIS — J4520 Mild intermittent asthma, uncomplicated: Secondary | ICD-10-CM | POA: Diagnosis not present

## 2018-03-28 DIAGNOSIS — D62 Acute posthemorrhagic anemia: Secondary | ICD-10-CM | POA: Diagnosis present

## 2018-03-28 DIAGNOSIS — D509 Iron deficiency anemia, unspecified: Secondary | ICD-10-CM | POA: Diagnosis present

## 2018-03-28 DIAGNOSIS — Z79890 Hormone replacement therapy (postmenopausal): Secondary | ICD-10-CM

## 2018-03-28 DIAGNOSIS — D649 Anemia, unspecified: Secondary | ICD-10-CM | POA: Diagnosis not present

## 2018-03-28 DIAGNOSIS — K2901 Acute gastritis with bleeding: Secondary | ICD-10-CM | POA: Diagnosis present

## 2018-03-28 DIAGNOSIS — K254 Chronic or unspecified gastric ulcer with hemorrhage: Secondary | ICD-10-CM | POA: Diagnosis present

## 2018-03-28 DIAGNOSIS — K640 First degree hemorrhoids: Secondary | ICD-10-CM | POA: Diagnosis present

## 2018-03-28 DIAGNOSIS — E538 Deficiency of other specified B group vitamins: Secondary | ICD-10-CM | POA: Diagnosis present

## 2018-03-28 DIAGNOSIS — I10 Essential (primary) hypertension: Secondary | ICD-10-CM | POA: Diagnosis present

## 2018-03-28 DIAGNOSIS — C169 Malignant neoplasm of stomach, unspecified: Principal | ICD-10-CM | POA: Diagnosis present

## 2018-03-28 DIAGNOSIS — Z87891 Personal history of nicotine dependence: Secondary | ICD-10-CM

## 2018-03-28 DIAGNOSIS — Z7951 Long term (current) use of inhaled steroids: Secondary | ICD-10-CM | POA: Diagnosis not present

## 2018-03-28 DIAGNOSIS — Z9010 Acquired absence of unspecified breast and nipple: Secondary | ICD-10-CM

## 2018-03-28 DIAGNOSIS — K253 Acute gastric ulcer without hemorrhage or perforation: Secondary | ICD-10-CM | POA: Diagnosis not present

## 2018-03-28 DIAGNOSIS — Z90710 Acquired absence of both cervix and uterus: Secondary | ICD-10-CM

## 2018-03-28 DIAGNOSIS — E039 Hypothyroidism, unspecified: Secondary | ICD-10-CM | POA: Diagnosis present

## 2018-03-28 DIAGNOSIS — Z853 Personal history of malignant neoplasm of breast: Secondary | ICD-10-CM

## 2018-03-28 DIAGNOSIS — J45909 Unspecified asthma, uncomplicated: Secondary | ICD-10-CM | POA: Diagnosis present

## 2018-03-28 LAB — COMPREHENSIVE METABOLIC PANEL
AG Ratio: 1.1 (calc) (ref 1.0–2.5)
ALBUMIN: 3.2 g/dL — AB (ref 3.5–5.0)
ALT: 14 U/L (ref 6–29)
ALT: 20 U/L (ref 0–44)
AST: 19 U/L (ref 10–35)
AST: 24 U/L (ref 15–41)
Albumin: 3.7 g/dL (ref 3.6–5.1)
Alkaline Phosphatase: 62 U/L (ref 38–126)
Alkaline phosphatase (APISO): 77 U/L (ref 33–130)
Anion gap: 11 (ref 5–15)
BUN: 17 mg/dL (ref 7–25)
BUN: 18 mg/dL (ref 6–20)
CO2: 24 mmol/L (ref 22–32)
Calcium: 9 mg/dL (ref 8.6–10.4)
Calcium: 8.9 mg/dL (ref 8.9–10.3)
Chloride: 102 mmol/L (ref 98–111)
Creat: 0.74 mg/dL (ref 0.50–1.05)
Creatinine, Ser: 0.78 mg/dL (ref 0.44–1.00)
GFR calc Af Amer: >60 mL/min (ref >60)
GFR calc non Af Amer: >60 mL/min (ref >60)
GLUCOSE: 107 mg/dL — AB (ref 70–99)
Glucose, Bld: 87 mg/dL (ref 65–99)
Potassium: 3.6 mmol/L (ref 3.5–5.3)
Potassium: 3.2 mmol/L — ABNORMAL LOW (ref 3.5–5.1)
Sodium: 137 mmol/L (ref 135–146)
Sodium: 137 mmol/L (ref 135–145)
Total Bilirubin: 0.2 mg/dL (ref 0.2–1.2)
Total Bilirubin: 0.3 mg/dL (ref 0.3–1.2)
Total Protein: 7.2 g/dL (ref 6.1–8.1)
Total Protein: 7.1 g/dL (ref 6.5–8.1)

## 2018-03-28 LAB — CBC
HCT: 15.4 % — ABNORMAL LOW (ref 35.0–45.0)
HCT: 27.9 % — ABNORMAL LOW (ref 36.0–46.0)
HEMATOCRIT: 15.7 % — AB (ref 36.0–46.0)
Hemoglobin: 4.7 g/dL — CL (ref 11.7–15.5)
Hemoglobin: 4.4 g/dL — CL (ref 12.0–15.0)
Hemoglobin: 8.7 g/dL — ABNORMAL LOW (ref 12.0–15.0)
MCH: 25.5 pg — ABNORMAL LOW (ref 27.0–33.0)
MCH: 25.1 pg — AB (ref 26.0–34.0)
MCH: 26.8 pg (ref 26.0–34.0)
MCHC: 30.5 g/dL — ABNORMAL LOW (ref 32.0–36.0)
MCHC: 28 g/dL — ABNORMAL LOW (ref 30.0–36.0)
MCHC: 31.2 g/dL (ref 30.0–36.0)
MCV: 83.7 fL (ref 80.0–100.0)
MCV: 89.7 fL (ref 80.0–100.0)
MCV: 85.8 fL (ref 80.0–100.0)
MPV: 11.4 fL (ref 7.5–12.5)
Platelets: 313 10*3/uL (ref 140–400)
Platelets: 267 10*3/uL (ref 150–400)
Platelets: 254 10*3/uL (ref 150–400)
RBC: 1.84 10*6/uL — ABNORMAL LOW (ref 3.80–5.10)
RBC: 1.75 MIL/uL — ABNORMAL LOW (ref 3.87–5.11)
RBC: 3.25 MIL/uL — ABNORMAL LOW (ref 3.87–5.11)
RDW: 14.5 % (ref 11.0–15.0)
RDW: 14.3 % (ref 11.5–15.5)
RDW: 13.7 % (ref 11.5–15.5)
WBC: 8.8 10*3/uL (ref 3.8–10.8)
WBC: 7 10*3/uL (ref 4.0–10.5)
WBC: 8.9 10*3/uL (ref 4.0–10.5)
nRBC: 0 % (ref 0.0–0.2)
nRBC: 0 % (ref 0.0–0.2)

## 2018-03-28 LAB — RETICULOCYTES
Immature Retic Fract: 16.3 % — ABNORMAL HIGH (ref 2.3–15.9)
RBC.: 1.7 MIL/uL — ABNORMAL LOW (ref 3.87–5.11)
Retic Count, Absolute: 55 10*3/uL (ref 19.0–186.0)
Retic Ct Pct: 3.2 % — ABNORMAL HIGH (ref 0.4–3.1)

## 2018-03-28 LAB — I-STAT BETA HCG BLOOD, ED (MC, WL, AP ONLY): I-stat hCG, quantitative: <5 m[IU]/mL (ref <5)

## 2018-03-28 LAB — TSH: TSH: 5.05 mIU/L — ABNORMAL HIGH (ref 0.40–4.50)

## 2018-03-28 LAB — COMPREHENSIVE METABOLIC PANEL WITH GFR
CO2: 26 mmol/L (ref 20–32)
Chloride: 101 mmol/L (ref 98–110)
Globulin: 3.5 g/dL (ref 1.9–3.7)

## 2018-03-28 LAB — IRON AND TIBC
IRON: 12 ug/dL — AB (ref 28–170)
Saturation Ratios: 2 % — ABNORMAL LOW (ref 10.4–31.8)
TIBC: 578 ug/dL — ABNORMAL HIGH (ref 250–450)
UIBC: 566 ug/dL

## 2018-03-28 LAB — PREPARE RBC (CROSSMATCH)

## 2018-03-28 LAB — VITAMIN B12: VITAMIN B 12: 231 pg/mL (ref 180–914)

## 2018-03-28 LAB — FOLATE: Folate: 23.3 ng/mL (ref >5.9)

## 2018-03-28 LAB — POC OCCULT BLOOD, ED: Fecal Occult Bld: POSITIVE — AB

## 2018-03-28 LAB — FERRITIN: Ferritin: 3 ng/mL — ABNORMAL LOW (ref 11–307)

## 2018-03-28 LAB — BRAIN NATRIURETIC PEPTIDE: Brain Natriuretic Peptide: 45 pg/mL (ref <100)

## 2018-03-28 LAB — D-DIMER, QUANTITATIVE: D-Dimer, Quant: 0.44 mcg/mL FEU (ref <0.50)

## 2018-03-28 LAB — ABO/RH: ABO/RH(D): B POS

## 2018-03-28 MED ORDER — LEVOTHYROXINE SODIUM 75 MCG PO TABS
150.0000 ug | ORAL_TABLET | Freq: Every day | ORAL | Status: DC
  Administered 2018-03-30 – 2018-03-31 (×2): 150 ug via ORAL
  Filled 2018-03-28 (×2): qty 2

## 2018-03-28 MED ORDER — SODIUM CHLORIDE 0.9 % IV BOLUS
500.0000 mL | Freq: Once | INTRAVENOUS | Status: AC
  Administered 2018-03-28: 500 mL via INTRAVENOUS

## 2018-03-28 MED ORDER — PEG 3350-KCL-NA BICARB-NACL 420 G PO SOLR
4000.0000 mL | Freq: Once | ORAL | Status: AC
  Administered 2018-03-28: 4000 mL via ORAL
  Filled 2018-03-28: qty 4000

## 2018-03-28 MED ORDER — FLUTICASONE FUROATE-VILANTEROL 200-25 MCG/INH IN AEPB
1.0000 | INHALATION_SPRAY | Freq: Every day | RESPIRATORY_TRACT | Status: DC
  Administered 2018-03-28 – 2018-03-31 (×4): 1 via RESPIRATORY_TRACT
  Filled 2018-03-28: qty 28

## 2018-03-28 MED ORDER — ALBUTEROL SULFATE (2.5 MG/3ML) 0.083% IN NEBU: 2.5000 mg | INHALATION_SOLUTION | RESPIRATORY_TRACT | Status: DC | PRN

## 2018-03-28 MED ORDER — SODIUM CHLORIDE 0.9 % IV SOLN
80.0000 mg | Freq: Once | INTRAVENOUS | Status: AC
  Administered 2018-03-28: 80 mg via INTRAVENOUS
  Filled 2018-03-28: qty 80

## 2018-03-28 MED ORDER — PROMETHAZINE HCL 25 MG PO TABS: 12.5000 mg | ORAL_TABLET | Freq: Four times a day (QID) | ORAL | Status: DC | PRN

## 2018-03-28 MED ORDER — ALBUTEROL SULFATE HFA 108 (90 BASE) MCG/ACT IN AERS: 1.0000 | INHALATION_SPRAY | RESPIRATORY_TRACT | Status: DC | PRN

## 2018-03-28 MED ORDER — SODIUM CHLORIDE 0.9 % IV SOLN: INTRAVENOUS | Status: DC

## 2018-03-28 MED ORDER — PANTOPRAZOLE SODIUM 40 MG PO TBEC
40.0000 mg | DELAYED_RELEASE_TABLET | Freq: Two times a day (BID) | ORAL | Status: DC
  Administered 2018-03-28: 40 mg via ORAL
  Filled 2018-03-28: qty 2

## 2018-03-28 MED ORDER — SODIUM CHLORIDE 0.9 % IV SOLN
8.0000 mg/h | INTRAVENOUS | Status: DC
  Administered 2018-03-28 – 2018-03-30 (×4): 8 mg/h via INTRAVENOUS
  Filled 2018-03-28 (×6): qty 80

## 2018-03-28 MED ORDER — HYDROCHLOROTHIAZIDE 25 MG PO TABS
25.0000 mg | ORAL_TABLET | Freq: Every day | ORAL | Status: DC
  Administered 2018-03-30 – 2018-03-31 (×2): 25 mg via ORAL
  Filled 2018-03-28 (×3): qty 1

## 2018-03-28 MED ORDER — SODIUM CHLORIDE 0.9% IV SOLUTION
Freq: Once | INTRAVENOUS | Status: AC
  Administered 2018-03-28: 11:00:00 via INTRAVENOUS

## 2018-03-28 MED ORDER — PANTOPRAZOLE SODIUM 40 MG IV SOLR: 40.0000 mg | Freq: Two times a day (BID) | INTRAVENOUS | Status: DC

## 2018-03-28 MED ORDER — ACETAMINOPHEN 500 MG PO TABS
1000.0000 mg | ORAL_TABLET | Freq: Four times a day (QID) | ORAL | Status: DC | PRN
  Administered 2018-03-28 – 2018-03-30 (×5): 1000 mg via ORAL
  Filled 2018-03-28 (×5): qty 2

## 2018-03-28 NOTE — Consult Note (Signed)
Referring Provider: Dr. Sloan Leiter Primary Care Physician:  Emeterio Reeve, DO Primary Gastroenterologist:  Althia Forts  Reason for Consultation:  Anemia  HPI: Mariah Kemp is a 58 y.o. female with symptomatic anemia with Hgb 4.7 yesterday at her doctor's office and 4.4 today. Hemodynamically stable on presentation. She has been having shortness of breath and severe dizziness for the past week. Reports black formed stools daily for the past week. Denies hematochezia. Denies abdominal pain, N/V, heartburn, or dysphagia. Took 2 doses of Naproxen in the past few weeks but a "bruised rib." Denies alcohol. Reports a normal colonoscopy 8 years ago but records not available. Receiving 3 U PRBCs today.  Past Medical History:  Diagnosis Date  . Hypertension   . Thyroid disease     Past Surgical History:  Procedure Laterality Date  . ABDOMINAL HYSTERECTOMY  1996  . CARPAL TUNNEL RELEASE    . MASTECTOMY  2007    Prior to Admission medications   Medication Sig Start Date End Date Taking? Authorizing Provider  acetaminophen (TYLENOL) 500 MG tablet Take 1,000 mg by mouth every 6 (six) hours as needed for mild pain.   Yes [provider]  acyclovir (ZOVIRAX) 400 MG tablet Take 1 tablet (400 mg total) by mouth 3 (three) times daily. For five days. As needed for cold sore. 02/24/18  Yes Emeterio Reeve, DO  albuterol (PROVENTIL HFA;VENTOLIN HFA) 108 (90 Base) MCG/ACT inhaler Inhale 1-2 puffs into the lungs every 4 (four) hours as needed for wheezing or shortness of breath. 09/16/17  Yes Emeterio Reeve, DO  budesonide-formoterol [facility]) 160-4.5 MCG/ACT inhaler Inhale 2 puffs into the lungs 2 (two) times daily. 09/16/17  Yes Emeterio Reeve, DO  hydrochlorothiazide (HYDRODIURIL) 25 MG tablet TAKE 1 TABLET BY MOUTH ONCE DAILY 12/02/17  Yes Emeterio Reeve, DO  levothyroxine (SYNTHROID, LEVOTHROID) 150 MCG tablet Take 1 tablet (150 mcg total) by mouth daily before breakfast. 09/16/17  Yes  Emeterio Reeve, DO    Scheduled Meds: . fluticasone furoate-vilanterol  1 puff Inhalation Daily  . [START ON 03/29/2018] hydrochlorothiazide  25 mg Oral Daily  . [START ON 03/29/2018] levothyroxine  150 mcg Oral QAC breakfast  . pantoprazole  40 mg Oral BID  . polyethylene glycol-electrolytes  4,000 mL Oral Once   Continuous Infusions: . sodium chloride     PRN Meds:.acetaminophen, albuterol, promethazine  Allergies as of 03/28/2018  . (No Known Allergies)    Family History  Problem Relation Age of Onset  . Cancer Father        liver [place]    Social History   Socioeconomic History  . Marital status: Married    Spouse name: Not on file  . Number of children: Not on file  . Years of education: Not on file  . Highest education level: Not on file  Occupational History  . Not on file  Social Needs  . Financial resource strain: Not on file  . Food insecurity:    Worry: Not on file    Inability: Not on file  . Transportation needs:    Medical: Not on file    Non-medical: Not on file  Tobacco Use  . Smoking status: Former Smoker    Last attempt to quit: 04/27/1994    Years since quitting: 23.9  . Smokeless tobacco: Never Used  Substance and Sexual Activity  . Alcohol use: No    Alcohol/week: 0.0 standard drinks  . Drug use: No  . Sexual activity: Never    Partners: Male  Lifestyle  .  Physical activity:    Days per week: Not on file    Minutes per session: Not on file  . Stress: Not on file  Relationships  . Social connections:    Talks on phone: Not on file    Gets together: Not on file    Attends religious service: Not on file    Active member of club or organization: Not on file    Attends meetings of clubs or organizations: Not on file    Relationship status: Not on file  . Intimate partner violence:    Fear of current or ex partner: Not on file    Emotionally abused: Not on file    Physically abused: Not on file    Forced sexual activity: Not on file   Other Topics Concern  . Not on file  Social History Narrative  . Not on file    Review of Systems: All negative except as stated above in HPI.  Physical Exam: Vital signs: Vitals:   03/28/18 1536 03/28/18 1611  BP: 121/66 117/73  Pulse: 73 71  Resp: 14 14  Temp: 98.4 F (36.9 C) 97.6 F (36.4 C)  SpO2: 99% 99%   Last BM Date: 03/27/18 General:   Alert,  Obese, pleasant and cooperative in NAD Head: normocephalic, atraumatic Eyes: anicteric sclera ENT: oropharynx clear Neck: supple, nontender Lungs:  Clear throughout to auscultation.   No wheezes, crackles, or rhonchi. No acute distress. Heart:  Regular rate and rhythm; no murmurs, clicks, rubs,  or gallops. Abdomen: epigastric tenderness with guarding, soft, nondistended, +BS  Rectal:  Deferred Ext: no edema  GI:  Lab Results: Recent Labs    03/27/18 1512 03/28/18 0621  WBC 8.8 7.0  HGB 4.7* 4.4*  HCT 15.4* 15.7*  PLT 313 267   BMET Recent Labs    03/27/18 1512 03/28/18 0621  NA 137 137  K 3.6 3.2*  CL 101 102  CO2 26 24  GLUCOSE 87 107*  BUN 17 18  CREATININE 0.74 0.78  CALCIUM 9.0 8.9   LFT Recent Labs    03/28/18 0621  PROT 7.1  ALBUMIN 3.2*  AST 24  ALT 20  ALKPHOS 62  BILITOT 0.3   PT/INR No results for input(s): LABPROT, INR in the last 72 hours.   Studies/Results: No results found.  Impression/Plan: Severe symptomatic anemia with melenic stools concerning for a peptic ulcer source but needs an updated colonoscopy in addition to an EGD to further evaluate her severe anemia. Will change to Protonix drip due to the melena that she has been having. Colon prep today for colonoscopy/EGD tomorrow. Clear liquid diet. NPO p MN. Follow H/Hs. Supportive care.    LOS: 0 days   [name]  03/28/2018, 5:06 PM  Questions please call [phone]

## 2018-03-28 NOTE — Telephone Encounter (Addendum)
Critical results, severe anemia, she will need to proceed to the ED, will likely be admitted for more labs to determine cause of anemia (retic counts, anemia panels), blood transfusion, and upper/lower endoscopy.  Spoke to on-call nurse, advised patient to proceed to a [facility] ED.  Spoke to patient as well, she is OTW to [facility].  On further history, colonoscopy was a few years ago but she has been having some epigastric bloating.  Didn't think much and took some OTC antacids.  This likely indicates gastritis/PUD/upper GI source of the blood loss.

## 2018-03-28 NOTE — Assessment & Plan Note (Addendum)
Critical results, severe anemia, she will need to proceed to the ED, will likely be admitted for more labs to determine cause of anemia (retic counts, anemia panels), blood transfusion, and upper/lower endoscopy.  Spoke to on-call nurse, advised patient to proceed to a [facility] ED.  Spoke to patient as well, she is OTW to [facility].  On further history, colonoscopy was a few years ago but she has been having some epigastric bloating.  Didn't think much and took some OTC antacids.  This likely indicates gastritis/PUD/upper GI source of the blood loss.

## 2018-03-28 NOTE — H&P (Signed)
History and Physical    Mariah Kemp AOZ:[id number] DOB: Dec 20, 1960 DOA: 03/28/2018  PCP: Emeterio Reeve, DO  Patient coming from: Home.  I have personally briefly reviewed patient's old medical records in [place] and Care everywhere.   Chief Complaint: Abnormal hemoglobin.  HPI: Mariah Kemp is a 58 y.o. female with medical history significant of hypothyroidism, mild intermittent asthma, hypertension who presents to the hospital after referral from PCP with very low hemoglobin.  Patient has been symptomatic for at least 3 weeks now.  Patient had her physical done on July of last year and her hemoglobin was 10.4.  She is usually in good health.  Since about a week before Christmas, she has been feeling weak, fatigued, intermittent headache, palpitations on walking and exertional shortness of breath.  This has been ongoing and getting worse since Christmas.  She went to see her primary care physician yesterday, a hemoglobin was done.  She was called early morning today to come to ER.  Patient does complain of vague chest discomfort on ambulation.  Shortness of breath on ambulation. Denies any fever or chills.  Denies any syncopal episodes.  She feels occasional dizzy.  Appetite is normal.  She has noticed blackish stool intermittently since at least 3 weeks now.  Patient denies any abdominal pain.  Denies any nausea or vomiting.  No hematemesis, hemoptysis or hematochezia.  No cough or wheezing.  Does not use any NSAID's.  Had colonoscopy long time ago, does not remember having any abnormal results.  She had hysterectomy done when she was 58 year old. ED Course: In the emergency room, hemodynamically stable.  Blood pressures are stable.  Patient on room air.  Hemoglobin is critically low at 4.4.  Iron panel shows severe iron deficiency anemia.  Patient consented for 3 units of transfusion, started on blood transfusion in the ER.  Review of Systems: As per HPI otherwise 10 point review of systems  negative.    Past Medical History:  Diagnosis Date  . Hypertension   . Thyroid disease     Past Surgical History:  Procedure Laterality Date  . ABDOMINAL HYSTERECTOMY  1996  . CARPAL TUNNEL RELEASE    . MASTECTOMY  2007     reports that she quit smoking about 23 years ago. She has never used smokeless tobacco. She reports that she does not drink alcohol or use drugs.  No Known Allergies  Family History  Problem Relation Age of Onset  . Cancer Father        liver CA     Prior to Admission medications   Medication Sig Start Date End Date Taking? Authorizing Provider  acetaminophen (TYLENOL) 500 MG tablet Take 1,000 mg by mouth every 6 (six) hours as needed for mild pain.   Yes [provider]  acyclovir (ZOVIRAX) 400 MG tablet Take 1 tablet (400 mg total) by mouth 3 (three) times daily. For five days. As needed for cold sore. 02/24/18  Yes Emeterio Reeve, DO  albuterol (PROVENTIL HFA;VENTOLIN HFA) 108 (90 Base) MCG/ACT inhaler Inhale 1-2 puffs into the lungs every 4 (four) hours as needed for wheezing or shortness of breath. 09/16/17  Yes Emeterio Reeve, DO  budesonide-formoterol [facility]) 160-4.5 MCG/ACT inhaler Inhale 2 puffs into the lungs 2 (two) times daily. 09/16/17  Yes Emeterio Reeve, DO  hydrochlorothiazide (HYDRODIURIL) 25 MG tablet TAKE 1 TABLET BY MOUTH ONCE DAILY 12/02/17  Yes Emeterio Reeve, DO  levothyroxine (SYNTHROID, LEVOTHROID) 150 MCG tablet Take 1 tablet (150 mcg total) by mouth daily  before breakfast. 09/16/17  Yes Emeterio Reeve, DO    Physical Exam: Vitals:   03/28/18 0730 03/28/18 0849 03/28/18 0912 03/28/18 0914  BP: 119/72 116/71 (!) 102/54 (!) 102/54  Pulse: 92 82 97 97  Resp: 17 16 16 16   Temp:   97.8 F (36.6 C) 97.8 F (36.6 C)  TempSrc:   Oral Oral  SpO2: 100% 100% 100% 100%    Constitutional: NAD, calm, comfortable Vitals:   03/28/18 0730 03/28/18 0849 03/28/18 0912 03/28/18 0914  BP: 119/72 116/71 (!) 102/54  (!) 102/54  Pulse: 92 82 97 97  Resp: 17 16 16 16   Temp:   97.8 F (36.6 C) 97.8 F (36.6 C)  TempSrc:   Oral Oral  SpO2: 100% 100% 100% 100%   Eyes: PERRL, lids and conjunctivae normal, pale looking.  ENMT: Mucous membranes are moist. Posterior pharynx clear of any exudate or lesions.Normal dentition.  Neck: normal, supple, no masses, no thyromegaly Respiratory: clear to auscultation bilaterally, no wheezing, no crackles. Normal respiratory effort. No accessory muscle use.  Cardiovascular: Regular rate and rhythm, no murmurs / rubs / gallops. No extremity edema. 2+ pedal pulses. No carotid bruits.  Abdomen: no tenderness, no masses palpated. No hepatosplenomegaly. Bowel sounds positive.  Musculoskeletal: no clubbing / cyanosis. No joint deformity upper and lower extremities. Good ROM, no contractures. Normal muscle tone.  Skin: no rashes, lesions, ulcers. No induration Neurologic: CN 2-12 grossly intact. Sensation intact, DTR normal. Strength 5/5 in all 4.  Psychiatric: Normal judgment and insight. Alert and oriented x 3. Normal mood.     Labs on Admission: I have personally reviewed following labs and imaging studies  CBC: Recent Labs  Lab 03/27/18 1512 03/28/18 0621  WBC 8.8 7.0  HGB 4.7* 4.4*  HCT 15.4* 15.7*  MCV 83.7 89.7  PLT 313 563   Basic Metabolic Panel: Recent Labs  Lab 03/27/18 1512 03/28/18 0621  NA 137 137  K 3.6 3.2*  CL 101 102  CO2 26 24  GLUCOSE 87 107*  BUN 17 18  CREATININE 0.74 0.78  CALCIUM 9.0 8.9   GFR: Estimated Creatinine Clearance: 72.5 mL/min (by C-G formula based on SCr of 0.78 mg/dL). Liver Function Tests: Recent Labs  Lab 03/27/18 1512 03/28/18 0621  AST 19 24  ALT 14 20  ALKPHOS  --  62  BILITOT 0.2 0.3  PROT 7.2 7.1  ALBUMIN  --  3.2*   No results for input(s): LIPASE, AMYLASE in the last 168 hours. No results for input(s): AMMONIA in the last 168 hours. Coagulation Profile: No results for input(s): INR, PROTIME in  the last 168 hours. Cardiac Enzymes: No results for input(s): CKTOTAL, CKMB, CKMBINDEX, TROPONINI in the last 168 hours. BNP (last 3 results) No results for input(s): PROBNP in the last 8760 hours. HbA1C: No results for input(s): HGBA1C in the last 72 hours. CBG: No results for input(s): GLUCAP in the last 168 hours. Lipid Profile: No results for input(s): CHOL, HDL, LDLCALC, TRIG, CHOLHDL, LDLDIRECT in the last 72 hours. Thyroid Function Tests: Recent Labs    03/27/18 1512  TSH 5.05*   Anemia Panel: Recent Labs    03/28/18 0743  VITAMINB12 231  FERRITIN 3*  TIBC 578*  IRON 12*  RETICCTPCT 3.2*   Urine analysis: No results found for: COLORURINE, APPEARANCEUR, LABSPEC, PHURINE, GLUCOSEU, HGBUR, BILIRUBINUR, KETONESUR, PROTEINUR, UROBILINOGEN, NITRITE, LEUKOCYTESUR  Radiological Exams on Admission: No results found.  Assessment/Plan Principal Problem:   Severe anemia Active Problems:   Essential  hypertension   History of breast cancer   Hypertriglyceridemia   Hypothyroidism   Moderate intermittent asthma without complication   Severe symptomatic iron deficiency anemia: Agree with admission to monitored unit.  Patient probably has acute on chronic anemia from GI blood loss.   Agree with 3 units of blood transfusion given severity of symptoms.  Recheck hemoglobin tomorrow morning. We will start patient on Protonix by mouth, as she does not have ongoing evidence of severe GI bleed.  Will keep n.p.o., I have paged gastroenterology if they can do EGD today and subsequent colonoscopy after prep.  She will benefit with iron replacement on discharge.  Essential hypertension: Blood pressures are stable.  Resume home medications.  Hypothyroidism: Euthyroid on current replacement.  Continue.  Moderate intermittent asthma: Without exacerbation.  Continue as needed bronchodilator therapy.   DVT prophylaxis: SCDs. Code Status: Full code. Family Communication: Patient Able of  medical discharge. Disposition Plan: Home. Consults called: Gastroenterology. Admission status: Inpatient.  Telemetry for blood transfusion.   Barb Merino MD [facility] Pager [phone]  If 7PM-7AM, please contact night-coverage [email or web address] Password [facility]  03/28/2018, 9:29 AM

## 2018-03-28 NOTE — H&P (View-Only) (Signed)
Referring Provider: Dr. Sloan Leiter Primary Care Physician:  Emeterio Reeve, DO Primary Gastroenterologist:  Althia Forts  Reason for Consultation:  Anemia  HPI: Mariah Kemp is a 58 y.o. female with symptomatic anemia with Hgb 4.7 yesterday at her doctor's office and 4.4 today. Hemodynamically stable on presentation. She has been having shortness of breath and severe dizziness for the past week. Reports black formed stools daily for the past week. Denies hematochezia. Denies abdominal pain, N/V, heartburn, or dysphagia. Took 2 doses of Naproxen in the past few weeks but a "bruised rib." Denies alcohol. Reports a normal colonoscopy 8 years ago but records not available. Receiving 3 U PRBCs today.  Past Medical History:  Diagnosis Date  . Hypertension   . Thyroid disease     Past Surgical History:  Procedure Laterality Date  . ABDOMINAL HYSTERECTOMY  1996  . CARPAL TUNNEL RELEASE    . MASTECTOMY  2007    Prior to Admission medications   Medication Sig Start Date End Date Taking? Authorizing Provider  acetaminophen (TYLENOL) 500 MG tablet Take 1,000 mg by mouth every 6 (six) hours as needed for mild pain.   Yes [provider]  acyclovir (ZOVIRAX) 400 MG tablet Take 1 tablet (400 mg total) by mouth 3 (three) times daily. For five days. As needed for cold sore. 02/24/18  Yes Emeterio Reeve, DO  albuterol (PROVENTIL HFA;VENTOLIN HFA) 108 (90 Base) MCG/ACT inhaler Inhale 1-2 puffs into the lungs every 4 (four) hours as needed for wheezing or shortness of breath. 09/16/17  Yes Emeterio Reeve, DO  budesonide-formoterol [facility]) 160-4.5 MCG/ACT inhaler Inhale 2 puffs into the lungs 2 (two) times daily. 09/16/17  Yes Emeterio Reeve, DO  hydrochlorothiazide (HYDRODIURIL) 25 MG tablet TAKE 1 TABLET BY MOUTH ONCE DAILY 12/02/17  Yes Emeterio Reeve, DO  levothyroxine (SYNTHROID, LEVOTHROID) 150 MCG tablet Take 1 tablet (150 mcg total) by mouth daily before breakfast. 09/16/17  Yes  Emeterio Reeve, DO    Scheduled Meds: . fluticasone furoate-vilanterol  1 puff Inhalation Daily  . [START ON 03/29/2018] hydrochlorothiazide  25 mg Oral Daily  . [START ON 03/29/2018] levothyroxine  150 mcg Oral QAC breakfast  . pantoprazole  40 mg Oral BID  . polyethylene glycol-electrolytes  4,000 mL Oral Once   Continuous Infusions: . sodium chloride     PRN Meds:.acetaminophen, albuterol, promethazine  Allergies as of 03/28/2018  . (No Known Allergies)    Family History  Problem Relation Age of Onset  . Cancer Father        liver CA    Social History   Socioeconomic History  . Marital status: Married    Spouse name: Not on file  . Number of children: Not on file  . Years of education: Not on file  . Highest education level: Not on file  Occupational History  . Not on file  Social Needs  . Financial resource strain: Not on file  . Food insecurity:    Worry: Not on file    Inability: Not on file  . Transportation needs:    Medical: Not on file    Non-medical: Not on file  Tobacco Use  . Smoking status: Former Smoker    Last attempt to quit: 04/27/1994    Years since quitting: 23.9  . Smokeless tobacco: Never Used  Substance and Sexual Activity  . Alcohol use: No    Alcohol/week: 0.0 standard drinks  . Drug use: No  . Sexual activity: Never    Partners: Male  Lifestyle  .  Physical activity:    Days per week: Not on file    Minutes per session: Not on file  . Stress: Not on file  Relationships  . Social connections:    Talks on phone: Not on file    Gets together: Not on file    Attends religious service: Not on file    Active member of club or organization: Not on file    Attends meetings of clubs or organizations: Not on file    Relationship status: Not on file  . Intimate partner violence:    Fear of current or ex partner: Not on file    Emotionally abused: Not on file    Physically abused: Not on file    Forced sexual activity: Not on file   Other Topics Concern  . Not on file  Social History Narrative  . Not on file    Review of Systems: All negative except as stated above in HPI.  Physical Exam: Vital signs: Vitals:   03/28/18 1536 03/28/18 1611  BP: 121/66 117/73  Pulse: 73 71  Resp: 14 14  Temp: 98.4 F (36.9 C) 97.6 F (36.4 C)  SpO2: 99% 99%   Last BM Date: 03/27/18 General:   Alert,  Obese, pleasant and cooperative in NAD Head: normocephalic, atraumatic Eyes: anicteric sclera ENT: oropharynx clear Neck: supple, nontender Lungs:  Clear throughout to auscultation.   No wheezes, crackles, or rhonchi. No acute distress. Heart:  Regular rate and rhythm; no murmurs, clicks, rubs,  or gallops. Abdomen: epigastric tenderness with guarding, soft, nondistended, +BS  Rectal:  Deferred Ext: no edema  GI:  Lab Results: Recent Labs    03/27/18 1512 03/28/18 0621  WBC 8.8 7.0  HGB 4.7* 4.4*  HCT 15.4* 15.7*  PLT 313 267   BMET Recent Labs    03/27/18 1512 03/28/18 0621  NA 137 137  K 3.6 3.2*  CL 101 102  CO2 26 24  GLUCOSE 87 107*  BUN 17 18  CREATININE 0.74 0.78  CALCIUM 9.0 8.9   LFT Recent Labs    03/28/18 0621  PROT 7.1  ALBUMIN 3.2*  AST 24  ALT 20  ALKPHOS 62  BILITOT 0.3   PT/INR No results for input(s): LABPROT, INR in the last 72 hours.   Studies/Results: No results found.  Impression/Plan: Severe symptomatic anemia with melenic stools concerning for a peptic ulcer source but needs an updated colonoscopy in addition to an EGD to further evaluate her severe anemia. Will change to Protonix drip due to the melena that she has been having. Colon prep today for colonoscopy/EGD tomorrow. Clear liquid diet. NPO p MN. Follow H/Hs. Supportive care.    LOS: 0 days   [name]  03/28/2018, 5:06 PM  Questions please call [phone]

## 2018-03-28 NOTE — ED Notes (Signed)
Attempted to call report to floor 

## 2018-03-28 NOTE — ED Provider Notes (Signed)
[facility] EMERGENCY DEPARTMENT Provider Note   CSN: [id number] Arrival date & time: 03/28/18  0550     History   Chief Complaint Chief Complaint  Patient presents with  . Abnormal Lab    HPI Mariah Kemp is a 58 y.o. female.  Patient presents the emergency department with abnormal hemoglobin.  Patient was seen at PCP yesterday.  She reports having headache, tachypalpitations, exertional shortness of breath, orthostatic lightheadedness ongoing starting about a month ago.  The symptoms have gradually and progressively become worse.  Yesterday she had lab work done and was found to have a hemoglobin in the fours.  She has never had anemia in the past and is never required a blood transfusion.  She denies any active chest pain or shortness of breath.  She denies any abdominal pain.  She notes that recently she has had some "dark" stools.  No blood noted in the urine.  History of breast cancer status post mastectomy and treatment.  She denies alcohol use.  Occasional use of NSAIDs.  No anticoagulant use.  Previous colonoscopy.      Past Medical History:  Diagnosis Date  . Hypertension   . Thyroid disease     Patient Active Problem List   Diagnosis Date Noted  . Severe anemia 03/28/2018  . Palpitations 03/27/2018  . Moderate intermittent asthma without complication 62/22/9798  . Mixed hyperlipidemia 09/24/2016  . Elevated fasting glucose 09/24/2016  . Cold sore 04/02/2016  . Nasal polyposis 09/07/2014  . Hypertriglyceridemia 08/24/2014  . Hypothyroidism 08/24/2014  . History of breast cancer 08/19/2014  . Chronic bronchitis ([place]) 05/31/2014  . Essential hypertension 05/31/2014    Past Surgical History:  Procedure Laterality Date  . ABDOMINAL HYSTERECTOMY  1996  . CARPAL TUNNEL RELEASE    . MASTECTOMY  2007     OB History   No obstetric history on file.      Home Medications    Prior to Admission medications   Medication Sig Start Date End  Date Taking? Authorizing Provider  acyclovir (ZOVIRAX) 400 MG tablet Take 1 tablet (400 mg total) by mouth 3 (three) times daily. For five days. As needed for cold sore. 02/24/18   Emeterio Reeve, DO  albuterol (PROVENTIL HFA;VENTOLIN HFA) 108 (90 Base) MCG/ACT inhaler Inhale 1-2 puffs into the lungs every 4 (four) hours as needed for wheezing or shortness of breath. 09/16/17   Emeterio Reeve, DO  budesonide-formoterol [facility]) 160-4.5 MCG/ACT inhaler Inhale 2 puffs into the lungs 2 (two) times daily. 09/16/17   Emeterio Reeve, DO  hydrochlorothiazide (HYDRODIURIL) 25 MG tablet TAKE 1 TABLET BY MOUTH ONCE DAILY 12/02/17   Emeterio Reeve, DO  levothyroxine (SYNTHROID, LEVOTHROID) 150 MCG tablet Take 1 tablet (150 mcg total) by mouth daily before breakfast. 09/16/17   Emeterio Reeve, DO    Family History Family History  Problem Relation Age of Onset  . Cancer Father        liver CA    Social History Social History   Tobacco Use  . Smoking status: Former Smoker    Last attempt to quit: 04/27/1994    Years since quitting: 23.9  . Smokeless tobacco: Never Used  Substance Use Topics  . Alcohol use: No    Alcohol/week: 0.0 standard drinks  . Drug use: No     Allergies   Patient has no known allergies.   Review of Systems Review of Systems  Constitutional: Positive for fatigue. Negative for fever.  HENT: Negative for rhinorrhea and  sore throat.   Eyes: Negative for redness.  Respiratory: Positive for shortness of breath. Negative for cough.   Cardiovascular: Positive for palpitations. Negative for chest pain.  Gastrointestinal: Positive for blood in stool ('dark' no red blood). Negative for abdominal pain, diarrhea, nausea and vomiting.  Genitourinary: Negative for dysuria.  Musculoskeletal: Negative for myalgias.  Skin: Negative for rash.  Neurological: Positive for headaches.     Physical Exam Updated Vital Signs BP 119/71   Pulse 93   Temp 98.2 F (36.8  C) (Oral)   Resp 19   SpO2 100%   Physical Exam Vitals signs and nursing note reviewed. Exam conducted with a chaperone present.  Constitutional:      Appearance: She is well-developed.  HENT:     Head: Normocephalic and atraumatic.  Eyes:     General:        Right eye: No discharge.        Left eye: No discharge.     Conjunctiva/sclera: Conjunctivae normal.  Neck:     Musculoskeletal: Normal range of motion and neck supple.  Cardiovascular:     Rate and Rhythm: Normal rate and regular rhythm.     Heart sounds: Normal heart sounds.  Pulmonary:     Effort: Pulmonary effort is normal.     Breath sounds: Normal breath sounds.  Abdominal:     Palpations: Abdomen is soft.     Tenderness: There is no abdominal tenderness.  Genitourinary:    Exam position: Knee-chest position.     Rectum: Guaiac result positive. External hemorrhoid present. No tenderness or internal hemorrhoid.     Comments: Stool is brown, hint of maroon, not melanotic.  Skin:    General: Skin is warm.     Coloration: Skin is pale.     Comments: Pallor noted.  Neurological:     Mental Status: She is alert.      ED Treatments / Results  Labs (all labs ordered are listed, but only abnormal results are displayed) Labs Reviewed  COMPREHENSIVE METABOLIC PANEL - Abnormal; Notable for the following components:      Result Value   Potassium 3.2 (*)    Glucose, Bld 107 (*)    Albumin 3.2 (*)    All other components within normal limits  CBC - Abnormal; Notable for the following components:   RBC 1.75 (*)    Hemoglobin 4.4 (*)    HCT 15.7 (*)    MCH 25.1 (*)    MCHC 28.0 (*)    All other components within normal limits  POC OCCULT BLOOD, ED - Abnormal; Notable for the following components:   Fecal Occult Bld POSITIVE (*)    All other components within normal limits  VITAMIN B12  FOLATE  IRON AND TIBC  FERRITIN  RETICULOCYTES  I-STAT BETA HCG BLOOD, ED (MC, [facility], AP ONLY)  POC OCCULT BLOOD, ED  TYPE  AND SCREEN  PREPARE RBC (CROSSMATCH)  ABO/RH    EKG None  Radiology No results found.  Procedures Procedures (including critical care time)  Medications Ordered in ED Medications  0.9 %  sodium chloride infusion (Manually program via Guardrails IV Fluids) (has no administration in time range)     Initial Impression / Assessment and Plan / ED Course  I have reviewed the triage vital signs and the nursing notes.  Pertinent labs & imaging results that were available during my care of the patient were reviewed by me and considered in my medical decision making (see chart  for details).     Patient seen and examined. Work-up initiated. Medications ordered.   Vital signs reviewed and are as follows: BP 119/71   Pulse 93   Temp 98.2 F (36.8 C) (Oral)   Resp 19   SpO2 100%   Hemoccult positive.  Suspect GI bleed.  Patient will be given transfusion.  Discussed risks and benefits with patient.  She agrees to proceed.  8:15 AM Pt updated. Exam stable. Will call for admission.   CRITICAL CARE Performed by: Carlisle Cater PA-C Total critical care time: 35 minutes Critical care time was exclusive of separately billable procedures and treating other patients. Critical care was necessary to treat or prevent imminent or life-threatening deterioration. Critical care was time spent personally by me on the following activities: development of treatment plan with patient and/or surrogate as well as nursing, discussions with consultants, evaluation of patient's response to treatment, examination of patient, obtaining history from patient or surrogate, ordering and performing treatments and interventions, ordering and review of laboratory studies, ordering and review of radiographic studies, pulse oximetry and re-evaluation of patient's condition.  8:36 AM Spoke with Dr. Sloan Leiter who will see.    Final Clinical Impressions(s) / ED Diagnoses   Final diagnoses:  Symptomatic anemia    Gastrointestinal hemorrhage, unspecified gastrointestinal hemorrhage type   Admit.   ED Discharge Orders    None       Carlisle Cater, PA-C 03/28/18 [id number]    Fredia Sorrow, MD 04/07/18 [phone]

## 2018-03-28 NOTE — ED Notes (Signed)
The pt  Has had a

## 2018-03-28 NOTE — ED Notes (Signed)
Pt was called at 0430a and was told that her hgb was 4.7  She has had dark stools for one week no vomiting

## 2018-03-28 NOTE — ED Notes (Signed)
Pounding heart for 2-3 weeks  She is very pale

## 2018-03-28 NOTE — Progress Notes (Signed)
Patient c/o sudden severe left sided back pain. Patient states this is a new pain that she hasn't had before. Patient has never received blood transfusion before. Patient appears anxious at this time. Called and notified MD and stopped blood transfusion due to reaction. MD ordered 54ml bolus and Tylenol. Per MD if symptoms resolve in 1 hr. Restart blood transfusion. VS stable at this time. Will continue to monitor.

## 2018-03-28 NOTE — ED Triage Notes (Signed)
Patient states she was called by PCP to come to ED for low Hgb of 4.7.  She states that she is short of breath, having a rapid HR when she does anything around the house.  She has not noticed any blood in her stool.

## 2018-03-29 ENCOUNTER — Encounter (HOSPITAL_COMMUNITY)
Admission: EM | Disposition: A | Discharge status: Home / Self Care | Payer: Self-pay | Source: Home / Self Care | Attending: Internal Medicine

## 2018-03-29 ENCOUNTER — Encounter (HOSPITAL_COMMUNITY): Payer: Self-pay

## 2018-03-29 ENCOUNTER — Inpatient Hospital Stay (HOSPITAL_COMMUNITY): Payer: 59 | Admitting: Anesthesiology

## 2018-03-29 DIAGNOSIS — K253 Acute gastric ulcer without hemorrhage or perforation: Secondary | ICD-10-CM

## 2018-03-29 DIAGNOSIS — D62 Acute posthemorrhagic anemia: Secondary | ICD-10-CM

## 2018-03-29 DIAGNOSIS — K922 Gastrointestinal hemorrhage, unspecified: Secondary | ICD-10-CM

## 2018-03-29 HISTORY — PX: BIOPSY: SHX5522

## 2018-03-29 HISTORY — PX: ESOPHAGOGASTRODUODENOSCOPY: SHX5428

## 2018-03-29 HISTORY — PX: COLONOSCOPY: SHX5424

## 2018-03-29 LAB — HIV ANTIBODY (ROUTINE TESTING W REFLEX): HIV Screen 4th Generation wRfx: NONREACTIVE

## 2018-03-29 SURGERY — EGD (ESOPHAGOGASTRODUODENOSCOPY)
Anesthesia: Monitor Anesthesia Care

## 2018-03-29 MED ORDER — LACTATED RINGERS IV SOLN
INTRAVENOUS | Status: DC | PRN
  Administered 2018-03-29: 10:00:00 via INTRAVENOUS

## 2018-03-29 MED ORDER — PROPOFOL 10 MG/ML IV BOLUS
INTRAVENOUS | Status: DC | PRN
  Administered 2018-03-29 (×4): 20 mg via INTRAVENOUS

## 2018-03-29 MED ORDER — PROPOFOL 500 MG/50ML IV EMUL
INTRAVENOUS | Status: DC | PRN
  Administered 2018-03-29: 100 ug/kg/min via INTRAVENOUS

## 2018-03-29 NOTE — Op Note (Signed)
[facility] Patient Name: Mariah Kemp Procedure Date : 03/29/2018 MRN: [id number] Attending MD: [name] , MD Date of Birth: 1961/02/13 CSN: [id number] Age: 58 Admit Type: Inpatient Procedure:                Upper GI endoscopy Indications:              Suspected upper gastrointestinal bleeding, Iron                            deficiency anemia, Melena Providers:                [name], MD, Zenon Mayo, RN,                            William Dalton, Technician Referring MD:             hospital team Medicines:                Propofol per Anesthesia, Monitored Anesthesia Care Complications:            No immediate complications. Estimated Blood Loss:     Estimated blood loss was minimal. Procedure:                Pre-Anesthesia Assessment:                           - Prior to the procedure, a History and Physical                            was performed, and patient medications and                            allergies were reviewed. The patient's tolerance of                            previous anesthesia was also reviewed. The risks                            and benefits of the procedure and the sedation                            options and risks were discussed with the patient.                            All questions were answered, and informed consent                            was obtained. Prior Anticoagulants: The patient has                            taken no previous anticoagulant or antiplatelet                            agents. ASA Grade Assessment: II - A patient with  mild systemic disease. After reviewing the risks                            and benefits, the patient was deemed in                            satisfactory condition to undergo the procedure.                           After obtaining informed consent, the endoscope was                            passed under direct vision. Throughout the                             procedure, the patient's blood pressure, pulse, and                            oxygen saturations were monitored continuously. The                            [id number]-H190 ([id number] Olympus gastroscope was                            introduced through the mouth, and advanced to the                            second part of duodenum. The upper GI endoscopy was                            accomplished without difficulty. The patient                            tolerated the procedure well. Scope In: Scope Out: Findings:      The examined esophagus was normal.      The Z-line was regular and was found 38 cm from the incisors.      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 The lesion was 30 mm in largest dimension.       Biopsies were taken with a cold forceps for histology. Estimated blood       loss was minimal.      The examined duodenum was normal.      Segmental mild inflammation characterized by congestion (edema),       erosions and erythema was found in the gastric antrum. Impression:               - Normal esophagus.                           - Z-line regular, 38 cm from the incisors.                           - Non-obstructing oozing gastric ulcer with  pigmented material. Biopsied.                           - Normal examined duodenum.                           - Acute gastritis. Recommendation:           - Clear liquid diet.                           - Await pathology results.                           - No aspirin, ibuprofen, naproxen, or other                            non-steroidal anti-inflammatory drugs.                           - Give Protonix (pantoprazole): 8 mg/hr IV by                            continuous infusion.                           - Observe patient's clinical  course. Procedure Code(s):        --- Professional ---                           [phone], Esophagogastroduodenoscopy, flexible,                            transoral; with biopsy, single or multiple Diagnosis Code(s):        --- Professional ---                           K92.1, Melena (includes Hematochezia)                           D50.9, Iron deficiency anemia, unspecified                           K25.4, Chronic or unspecified gastric ulcer with                            hemorrhage                           K29.00, Acute gastritis without bleeding CPT copyright 2018 [employer]. All rights reserved. The codes documented in this report are preliminary and upon coder review may  be revised to meet current compliance requirements. [name], MD 03/29/2018 11:04:44 AM This report has been signed electronically. Number of Addenda: 0

## 2018-03-29 NOTE — Progress Notes (Signed)
PROGRESS NOTE                                                                                                                                                                                                             Patient Demographics:    Mariah Kemp, is a 58 y.o. female, DOB - [birth year], EEF:[id number]  Admit date - 03/28/2018   Admitting Physician Barb Merino, MD  Outpatient Primary MD for the patient is Emeterio Reeve, DO  LOS - 1  Outpatient Specialists: None  Chief Complaint  Patient presents with  . Abnormal Lab       Brief Narrative   58 year old female with hypothyroidism, mild intermittent asthma, hypertension to the ED by PCP with low hemoglobin with symptoms of fatigue and dizziness with intermittent headache, palpitation and exertional dyspnea for almost 3 weeks.  Her hemoglobin was 10.4 about 6 months back during routine physical.  Also reported vague chest discomfort on ambulation.  Patient did notice dark stool but no bright red blood per rectum, hematemesis or hemoptysis. In the ED hemoglobin was 4.4 with iron panel showing severe iron deficiency anemia.  Received 3 unit PRBC with improvement in her hemoglobin to 8.7. Seen by GI and underwent EGD and colonoscopy on 1/12..    Subjective:   Patient seen and examined this morning prior to endoscopy.  Reports feeling better after transfusion.   Assessment  & Plan :    Principal Problem:   Severe iron deficiency/acute blood loss anemia secondary to upper GI bleed. Received 3 unit PRBC with hemoglobin improved to 8.7.  Monitor H&H in a.m. 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  Biopsies taken.  Continue clear liquid for today and maintain on Protonix drip.  Active Problems:   Essential hypertension Stable.  Continue HCTZ  Hypothyroidism Continue Synthroid     Moderate intermittent asthma without  complication Stable.  Continue albuterol inhaler and PRN nebs      Code Status : Full code  Family Communication  : None at bedside  Disposition Plan  : Possibly tomorrow if H&H stable and further plans per GI  Barriers For Discharge : Active symptoms  Consults  : [facility]  Procedures  : EGD/Colonoscopy  DVT Prophylaxis  : SCDs  Lab Results  Component Value Date   PLT 254 03/28/2018  Antibiotics  :    Anti-infectives (From admission, onward)   None        Objective:   Vitals:   03/29/18 0829 03/29/18 0945 03/29/18 1055 03/29/18 1105  BP:  132/67 117/68 128/74  Pulse:  66 76 67  Resp:  13 15 14   Temp:  98.8 F (37.1 C) 98.7 F (37.1 C)   TempSrc:  Oral Oral   SpO2: 97% 98% 100% 100%    Wt Readings from Last 3 Encounters:  03/27/18 78 kg  03/19/18 75.8 kg  09/16/17 69.6 kg     Intake/Output Summary (Last 24 hours) at 03/29/2018 1156 Last data filed at 03/29/2018 1048 Gross per 24 hour  Intake 1269.75 ml  Output -  Net 1269.75 ml     Physical Exam  Gen: not in distress HEENT: Pallor present, moist mucosa, supple neck Chest: clear b/l, no added sounds CVS: N S1&S2, no murmurs,  GI: soft, NT, ND, BS+ Musculoskeletal: warm, no edema     Data Review:    CBC Recent Labs  Lab 03/27/18 1512 03/28/18 0621 03/28/18 2113  WBC 8.8 7.0 8.9  HGB 4.7* 4.4* 8.7*  HCT 15.4* 15.7* 27.9*  PLT 313 267 254  MCV 83.7 89.7 85.8  MCH 25.5* 25.1* 26.8  MCHC 30.5* 28.0* 31.2  RDW 14.5 14.3 13.7    Chemistries  Recent Labs  Lab 03/27/18 1512 03/28/18 0621  NA 137 137  K 3.6 3.2*  CL 101 102  CO2 26 24  GLUCOSE 87 107*  BUN 17 18  CREATININE 0.74 0.78  CALCIUM 9.0 8.9  AST 19 24  ALT 14 20  ALKPHOS  --  62  BILITOT 0.2 0.3   ------------------------------------------------------------------------------------------------------------------ No results for input(s): CHOL, HDL, LDLCALC, TRIG, CHOLHDL, LDLDIRECT in the last 72 hours.  Lab  Results  Component Value Date   HGBA1C 5.6 09/24/2016   ------------------------------------------------------------------------------------------------------------------ Recent Labs    03/27/18 1512  TSH 5.05*   ------------------------------------------------------------------------------------------------------------------ Recent Labs    03/28/18 0743  VITAMINB12 231  FOLATE 23.3  FERRITIN 3*  TIBC 578*  IRON 12*  RETICCTPCT 3.2*    Coagulation profile No results for input(s): INR, PROTIME in the last 168 hours.  Recent Labs    03/27/18 1512  DDIMER 0.44    Cardiac Enzymes No results for input(s): CKMB, TROPONINI, MYOGLOBIN in the last 168 hours.  Invalid input(s): CK ------------------------------------------------------------------------------------------------------------------    Component Value Date/Time   BNP 45 03/27/2018 1512    Inpatient Medications  Scheduled Meds: . fluticasone furoate-vilanterol  1 puff Inhalation Daily  . hydrochlorothiazide  25 mg Oral Daily  . levothyroxine  150 mcg Oral QAC breakfast  . [START ON 04/01/2018] pantoprazole  40 mg Intravenous Q12H   Continuous Infusions: . sodium chloride    . pantoprozole (PROTONIX) infusion 8 mg/hr (03/29/18 0513)   PRN Meds:.acetaminophen, albuterol, promethazine  Micro Results No results found for this or any previous visit (from the past 240 hour(s)).  Radiology Reports Dg Ribs Unilateral W/chest Right  Result Date: 03/19/2018 CLINICAL DATA:  Right rib pain, bruising EXAM: RIGHT RIBS AND CHEST - 3+ VIEW COMPARISON:  04/27/2014 FINDINGS: Heart and mediastinal contours are within normal limits. No focal opacities or effusions. No acute bony abnormality. No visible fracture or focal rib lesion. Surgical clips in the right breast and axilla. IMPRESSION: No active cardiopulmonary disease. Electronically Signed   By: Rolm Baptise M.D.   On: 03/19/2018 12:43    Time Spent in minutes  [place] M.D on 03/29/2018 at 11:56 AM  Between 7am to 7pm - Pager - [phone]  After 7pm go to [email or web address] - password [facility]  [facility] -  Office  [phone]

## 2018-03-29 NOTE — Anesthesia Preprocedure Evaluation (Addendum)
Anesthesia Evaluation  Patient identified by MRN, date of birth, ID band Patient awake    Reviewed: Allergy & Precautions, NPO status , Patient's Chart, lab work & pertinent test results  History of Anesthesia Complications Negative for: history of anesthetic complications  Airway Mallampati: II  TM Distance: >3 FB Neck ROM: Full    Dental no notable dental hx. (+) Dental Advisory Given   Pulmonary asthma , former smoker,    Pulmonary exam normal        Cardiovascular hypertension, Pt. on medications negative cardio ROS Normal cardiovascular exam     Neuro/Psych negative neurological ROS  negative psych ROS   GI/Hepatic negative GI ROS, Neg liver ROS,   Endo/Other  Hypothyroidism   Renal/GU negative Renal ROS  negative genitourinary   Musculoskeletal negative musculoskeletal ROS (+)   Abdominal   Peds negative pediatric ROS (+)  Hematology  (+) anemia ,   Anesthesia Other Findings   Reproductive/Obstetrics negative OB ROS                            Anesthesia Physical Anesthesia Plan  ASA: II  Anesthesia Plan: MAC   Post-op Pain Management:    Induction: Intravenous  PONV Risk Score and Plan: 2 and Ondansetron and Propofol infusion  Airway Management Planned: Simple Face Mask  Additional Equipment:   Intra-op Plan:   Post-operative Plan:   Informed Consent: I have reviewed the patients History and Physical, chart, labs and discussed the procedure including the risks, benefits and alternatives for the proposed anesthesia with the patient or authorized representative who has indicated his/her understanding and acceptance.   Dental advisory given  Plan Discussed with: CRNA and Anesthesiologist  Anesthesia Plan Comments:        Anesthesia Quick Evaluation

## 2018-03-29 NOTE — Transfer of Care (Signed)
Immediate Anesthesia Transfer of Care Note  Patient: Mariah Kemp  Procedure(s) Performed: ESOPHAGOGASTRODUODENOSCOPY (EGD) (N/A ) COLONOSCOPY (N/A ) BIOPSY  Patient Location: Endoscopy Unit  Anesthesia Type:MAC  Level of Consciousness: awake, alert  and oriented  Airway & Oxygen Therapy: Patient Spontanous Breathing  Post-op Assessment: Report given to RN and Post -op Vital signs reviewed and stable  Post vital signs: Reviewed and stable  Last Vitals:  Vitals Value Taken Time  BP 117/68 03/29/2018 10:55 AM  Temp    Pulse 76 03/29/2018 10:56 AM  Resp 17 03/29/2018 10:56 AM  SpO2 100 % 03/29/2018 10:56 AM  Vitals shown include unvalidated device data.  Last Pain:  Vitals:   03/29/18 0945  TempSrc: Oral  PainSc: 0-No pain         Complications: No apparent anesthesia complications

## 2018-03-29 NOTE — Interval H&P Note (Signed)
History and Physical Interval Note:  03/29/2018 10:00 AM  Mariah Kemp  has presented today for surgery, with the diagnosis of Anemia  The various methods of treatment have been discussed with the patient and family. After consideration of risks, benefits and other options for treatment, the patient has consented to  Procedure(s): ESOPHAGOGASTRODUODENOSCOPY (EGD) (N/A) COLONOSCOPY (N/A) as a surgical intervention .  The patient's history has been reviewed, patient examined, no change in status, stable for surgery.  I have reviewed the patient's chart and labs.  Questions were answered to the patient's satisfaction.     [name]

## 2018-03-29 NOTE — Op Note (Signed)
[facility] Patient Name: Mariah Kemp Procedure Date : 03/29/2018 MRN: [id number] Attending MD: [name] , MD Date of Birth: 1961-01-13 CSN: [id number] Age: 58 Admit Type: Inpatient Procedure:                Colonoscopy Indications:              Last colonoscopy: date unknown, Melena, Iron                            deficiency anemia Providers:                [name], MD, Zenon Mayo, RN,                            William Dalton, Technician Referring MD:             hospital team Medicines:                Propofol per Anesthesia, Monitored Anesthesia Care Complications:            No immediate complications. Estimated Blood Loss:     Estimated blood loss: none. Procedure:                Pre-Anesthesia Assessment:                           - Prior to the procedure, a History and Physical                            was performed, and patient medications and                            allergies were reviewed. The patient's tolerance of                            previous anesthesia was also reviewed. The risks                            and benefits of the procedure and the sedation                            options and risks were discussed with the patient.                            All questions were answered, and informed consent                            was obtained. Prior Anticoagulants: The patient has                            taken no previous anticoagulant or antiplatelet                            agents. ASA Grade Assessment: II - A patient with  mild systemic disease. After reviewing the risks                            and benefits, the patient was deemed in                            satisfactory condition to undergo the procedure.                           - Prior to the procedure, a History and Physical                            was performed, and patient medications and   allergies were reviewed. The patient's tolerance of                            previous anesthesia was also reviewed. The risks                            and benefits of the procedure and the sedation                            options and risks were discussed with the patient.                            All questions were answered, and informed consent                            was obtained. Prior Anticoagulants: The patient has                            taken no previous anticoagulant or antiplatelet                            agents. ASA Grade Assessment: II - A patient with                            mild systemic disease. After reviewing the risks                            and benefits, the patient was deemed in                            satisfactory condition to undergo the procedure.                           After obtaining informed consent, the colonoscope                            was passed under direct vision. Throughout the                            procedure, the patient's blood pressure, pulse, and  oxygen saturations were monitored continuously. The                            [id number]-H190DL ([id number] Olympus pediatric colonoscope                            was introduced through the anus and advanced to the                            the cecum, identified by appendiceal orifice and                            ileocecal valve. The colonoscopy was performed                            without difficulty. The patient tolerated the                            procedure well. The quality of the bowel                            preparation was fair and fair but repeated                            irrigation led to a good and adequate prep. The                            terminal ileum, ileocecal valve, appendiceal                            orifice, and rectum were photographed. Scope In: 10:32:01 AM Scope Out: 10:47:52 AM Scope Withdrawal Time: 0 hours  10 minutes 37 seconds  Total Procedure Duration: 0 hours 15 minutes 51 seconds  Findings:      The perianal and digital rectal examinations were normal.      Internal hemorrhoids were found during retroflexion. The hemorrhoids       were small and Grade I (internal hemorrhoids that do not prolapse).      The exam was otherwise normal throughout the examined colon.      The terminal ileum appeared normal. Impression:               - Preparation of the colon was fair.                           - Internal hemorrhoids.                           - The examined portion of the ileum was normal.                           - No specimens collected. Recommendation:           - Clear liquid diet.                           - Repeat colonoscopy in  10 years for screening                            purposes. Procedure Code(s):        --- Professional ---                           [phone], Colonoscopy, flexible; diagnostic, including                            collection of specimen(s) by brushing or washing,                            when performed (separate procedure) Diagnosis Code(s):        --- Professional ---                           K92.1, Melena (includes Hematochezia)                           D50.9, Iron deficiency anemia, unspecified                           K64.0, First degree hemorrhoids CPT copyright 2018 American Medical Association. All rights reserved. The codes documented in this report are preliminary and upon coder review may  be revised to meet current compliance requirements. [name], MD 03/29/2018 11:07:54 AM This report has been signed electronically. Number of Addenda: 0

## 2018-03-29 NOTE — Brief Op Note (Signed)
Large fungating malignant-appearing proximal gastric ulcer that is the source of her severe anemia and recent melena. Biopsies taken. Keep on Protonix drip today. Colonoscopy unrevealing. See endopro for details. Clear liquid diet only today. Dr. Alessandra Bevels will f/u for [facility] tomorrow.

## 2018-03-29 NOTE — Anesthesia Postprocedure Evaluation (Signed)
Anesthesia Post Note  Patient: Acelynn Dejonge  Procedure(s) Performed: ESOPHAGOGASTRODUODENOSCOPY (EGD) (N/A ) COLONOSCOPY (N/A ) BIOPSY     Patient location during evaluation: Endoscopy Anesthesia Type: MAC Level of consciousness: awake and alert Pain management: pain level controlled Vital Signs Assessment: post-procedure vital signs reviewed and stable Respiratory status: spontaneous breathing, nonlabored ventilation, respiratory function stable and patient connected to nasal cannula oxygen Cardiovascular status: blood pressure returned to baseline and stable Postop Assessment: no apparent nausea or vomiting Anesthetic complications: no    Last Vitals:  Vitals:   03/29/18 1055 03/29/18 1105  BP: 117/68 128/74  Pulse: 76 67  Resp: 15 14  Temp: 37.1 C   SpO2: 100% 100%    Last Pain:  Vitals:   03/29/18 1105  TempSrc:   PainSc: 0-No pain                 Quintin Hjort DANIEL

## 2018-03-29 NOTE — Plan of Care (Signed)
Pt status post 3U PRBC transfusion with post Hgb to 8.7.  Pt without dizziness, color good, VSS.

## 2018-03-30 DIAGNOSIS — D649 Anemia, unspecified: Secondary | ICD-10-CM

## 2018-03-30 LAB — CBC
HCT: 23.7 % — ABNORMAL LOW (ref 36.0–46.0)
Hemoglobin: 7.5 g/dL — ABNORMAL LOW (ref 12.0–15.0)
MCH: 27.9 pg (ref 26.0–34.0)
MCHC: 31.6 g/dL (ref 30.0–36.0)
MCV: 88.1 fL (ref 80.0–100.0)
PLATELETS: 208 10*3/uL (ref 150–400)
RBC: 2.69 MIL/uL — AB (ref 3.87–5.11)
RDW: 14.3 % (ref 11.5–15.5)
WBC: 6.2 10*3/uL (ref 4.0–10.5)
nRBC: 0 % (ref 0.0–0.2)

## 2018-03-30 MED ORDER — PANTOPRAZOLE SODIUM 40 MG IV SOLR
40.0000 mg | Freq: Two times a day (BID) | INTRAVENOUS | Status: DC
  Administered 2018-03-30 – 2018-03-31 (×2): 40 mg via INTRAVENOUS
  Filled 2018-03-30 (×2): qty 40

## 2018-03-30 NOTE — Progress Notes (Signed)
PROGRESS NOTE                                                                                                                                                                                                             Patient Demographics:    Mariah Kemp, is a 58 y.o. female, DOB - Aug 22, 1960, ZOX:[id number]  Admit date - 03/28/2018   Admitting Physician Barb Merino, MD  Outpatient Primary MD for the patient is Emeterio Reeve, DO  LOS - 2  Outpatient Specialists: None  Chief Complaint  Patient presents with  . Abnormal Lab       Brief Narrative   58 year old female with hypothyroidism, mild intermittent asthma, hypertension to the ED by PCP with low hemoglobin with symptoms of fatigue and dizziness with intermittent headache, palpitation and exertional dyspnea for almost 3 weeks.  Her hemoglobin was 10.4 about 6 months back during routine physical.  Also reported vague chest discomfort on ambulation.  Patient did notice dark stool but no bright red blood per rectum, hematemesis or hemoptysis. In the ED hemoglobin was 4.4 with iron panel showing severe iron deficiency anemia.  Received 3 unit PRBC with improvement in her hemoglobin to 8.7. Seen by GI and underwent EGD and colonoscopy on 1/12..    Subjective:     Assessment  & Plan :    Principal Problem:   Severe iron deficiency/acute blood loss anemia secondary to upper GI bleed.  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  Biopsies sent. Received 3 unit PRBC with hemoglobin improved to 8.7.,  Dropped down to 7.5 today.  Monitor H&H for another day, may need transfusion in a.m. if further drop.  Switched PPI drip to IV twice daily.  Diet advanced.  Active Problems:   Essential hypertension Stable.  Continue HCTZ  Hypothyroidism Continue Synthroid     Moderate intermittent asthma without complication Stable.  Continue  albuterol inhaler and PRN nebs      Code Status : Full code  Family Communication  : None at bedside  Disposition Plan  : Home in a.m. if H&H stable.  Barriers For Discharge : Active symptoms  Consults  : [facility]  Procedures  : EGD/Colonoscopy  DVT Prophylaxis  : SCDs  Lab Results  Component Value Date   PLT 208 03/30/2018    Antibiotics  :  Anti-infectives (From admission, onward)   None        Objective:   Vitals:   03/29/18 1620 03/30/18 0315 03/30/18 0500 03/30/18 0819  BP: (!) 102/54 (!) 104/57    Pulse: 63 63  69  Resp: 12   16  Temp: 97.7 F (36.5 C) (!) 97.5 F (36.4 C)    TempSrc: Oral Oral    SpO2: 99% 100%  94%  Weight:   79.8 kg   Height:  5' (1.524 m)      Wt Readings from Last 3 Encounters:  03/30/18 79.8 kg  03/27/18 78 kg  03/19/18 75.8 kg     Intake/Output Summary (Last 24 hours) at 03/30/2018 1613 Last data filed at 03/30/2018 1300 Gross per 24 hour  Intake 547.82 ml  Output -  Net 547.82 ml    Physical exam Not in distress HEENT: Pallor present, moist mucosa, supple neck Chest: Clear bilaterally CVs: Normal S1-S2 GI: Soft, nondistended, nontender Musculoskeletal: Warm, no edema     Data Review:    CBC Recent Labs  Lab 03/27/18 1512 03/28/18 0621 03/28/18 2113 03/30/18 0246  WBC 8.8 7.0 8.9 6.2  HGB 4.7* 4.4* 8.7* 7.5*  HCT 15.4* 15.7* 27.9* 23.7*  PLT 313 267 254 208  MCV 83.7 89.7 85.8 88.1  MCH 25.5* 25.1* 26.8 27.9  MCHC 30.5* 28.0* 31.2 31.6  RDW 14.5 14.3 13.7 14.3    Chemistries  Recent Labs  Lab 03/27/18 1512 03/28/18 0621  NA 137 137  K 3.6 3.2*  CL 101 102  CO2 26 24  GLUCOSE 87 107*  BUN 17 18  CREATININE 0.74 0.78  CALCIUM 9.0 8.9  AST 19 24  ALT 14 20  ALKPHOS  --  62  BILITOT 0.2 0.3   ------------------------------------------------------------------------------------------------------------------ No results for input(s): CHOL, HDL, LDLCALC, TRIG, CHOLHDL, LDLDIRECT in the  last 72 hours.  Lab Results  Component Value Date   HGBA1C 5.6 09/24/2016   ------------------------------------------------------------------------------------------------------------------ No results for input(s): TSH, T4TOTAL, T3FREE, THYROIDAB in the last 72 hours.  Invalid input(s): FREET3 ------------------------------------------------------------------------------------------------------------------ Recent Labs    03/28/18 0743  VITAMINB12 231  FOLATE 23.3  FERRITIN 3*  TIBC 578*  IRON 12*  RETICCTPCT 3.2*    Coagulation profile No results for input(s): INR, PROTIME in the last 168 hours.  No results for input(s): DDIMER in the last 72 hours.  Cardiac Enzymes No results for input(s): CKMB, TROPONINI, MYOGLOBIN in the last 168 hours.  Invalid input(s): CK ------------------------------------------------------------------------------------------------------------------    Component Value Date/Time   BNP 45 03/27/2018 1512    Inpatient Medications  Scheduled Meds: . fluticasone furoate-vilanterol  1 puff Inhalation Daily  . hydrochlorothiazide  25 mg Oral Daily  . levothyroxine  150 mcg Oral QAC breakfast  . pantoprazole  40 mg Intravenous Q12H   Continuous Infusions: . sodium chloride     PRN Meds:.acetaminophen, albuterol, promethazine  Micro Results No results found for this or any previous visit (from the past 240 hour(s)).  Radiology Reports Dg Ribs Unilateral W/chest Right  Result Date: 03/19/2018 CLINICAL DATA:  Right rib pain, bruising EXAM: RIGHT RIBS AND CHEST - 3+ VIEW COMPARISON:  04/27/2014 FINDINGS: Heart and mediastinal contours are within normal limits. No focal opacities or effusions. No acute bony abnormality. No visible fracture or focal rib lesion. Surgical clips in the right breast and axilla. IMPRESSION: No active cardiopulmonary disease. Electronically Signed   By: Rolm Baptise M.D.   On: 03/19/2018 12:43    Time Spent  in minutes   25   Sheryle Vice M.D on 03/30/2018 at 4:13 PM  Between 7am to 7pm - Pager - [phone]  After 7pm go to [email or web address] - password [email or web address]  [facility] -  Office  [phone]

## 2018-03-30 NOTE — Progress Notes (Signed)
[facility] Gastroenterology Progress Note  Mariah Kemp 58 y.o. 02/06/1961  CC: GI bleed   Subjective: No further bleeding episode but drop in hemoglobin noted.  No bowel movement today.  Patient denies abdominal pain, nausea vomiting.     Objective: Vital signs in last 24 hours: Vitals:   03/30/18 0315 03/30/18 0819  BP: (!) 104/57   Pulse: 63 69  Resp:  16  Temp: (!) 97.5 F (36.4 C)   SpO2: 100% 94%    Physical Exam:  General.  Alert/oriented x3. ABD :  Soft, nontender, nondistended, bowel sounds present.  No peritoneal signs    Lab Results: Recent Labs    03/27/18 1512 03/28/18 0621  NA 137 137  K 3.6 3.2*  CL 101 102  CO2 26 24  GLUCOSE 87 107*  BUN 17 18  CREATININE 0.74 0.78  CALCIUM 9.0 8.9   Recent Labs    03/27/18 1512 03/28/18 0621  AST 19 24  ALT 14 20  ALKPHOS  --  62  BILITOT 0.2 0.3  PROT 7.2 7.1  ALBUMIN  --  3.2*   Recent Labs    03/28/18 2113 03/30/18 0246  WBC 8.9 6.2  HGB 8.7* 7.5*  HCT 27.9* 23.7*  MCV 85.8 88.1  PLT 254 208   No results for input(s): LABPROT, INR in the last 72 hours.    Assessment/Plan: -Ulcerated gastric mass found on EGD yesterday.  Biopsies pending -Symptomatic anemia.  Recommendations -------------------------- -Change PPI to IV twice daily. -Advance diet -Follow biopsy results. -Hopefully discharge tomorrow.   Otis Brace MD, FACP 03/30/2018, 2:44 PM  Contact #  [phone]

## 2018-03-31 ENCOUNTER — Telehealth: Payer: Self-pay

## 2018-03-31 ENCOUNTER — Other Ambulatory Visit: Payer: Self-pay | Admitting: Gastroenterology

## 2018-03-31 DIAGNOSIS — C169 Malignant neoplasm of stomach, unspecified: Secondary | ICD-10-CM

## 2018-03-31 DIAGNOSIS — K253 Acute gastric ulcer without hemorrhage or perforation: Secondary | ICD-10-CM | POA: Diagnosis present

## 2018-03-31 DIAGNOSIS — E538 Deficiency of other specified B group vitamins: Secondary | ICD-10-CM

## 2018-03-31 DIAGNOSIS — I10 Essential (primary) hypertension: Secondary | ICD-10-CM

## 2018-03-31 DIAGNOSIS — J4520 Mild intermittent asthma, uncomplicated: Secondary | ICD-10-CM

## 2018-03-31 DIAGNOSIS — D649 Anemia, unspecified: Secondary | ICD-10-CM | POA: Diagnosis present

## 2018-03-31 DIAGNOSIS — K922 Gastrointestinal hemorrhage, unspecified: Secondary | ICD-10-CM | POA: Diagnosis present

## 2018-03-31 LAB — CBC
HCT: 25.4 % — ABNORMAL LOW (ref 36.0–46.0)
Hemoglobin: 8 g/dL — ABNORMAL LOW (ref 12.0–15.0)
MCH: 27.8 pg (ref 26.0–34.0)
MCHC: 31.5 g/dL (ref 30.0–36.0)
MCV: 88.2 fL (ref 80.0–100.0)
Platelets: 223 10*3/uL (ref 150–400)
RBC: 2.88 MIL/uL — ABNORMAL LOW (ref 3.87–5.11)
RDW: 14.2 % (ref 11.5–15.5)
WBC: 6.4 10*3/uL (ref 4.0–10.5)
nRBC: 0 % (ref 0.0–0.2)

## 2018-03-31 MED ORDER — CYANOCOBALAMIN 1000 MCG PO TABS: 1000.0000 ug | ORAL_TABLET | Freq: Every day | ORAL | 0 refills | Status: DC

## 2018-03-31 MED ORDER — PANTOPRAZOLE SODIUM 40 MG PO TBEC: 40.0000 mg | DELAYED_RELEASE_TABLET | Freq: Two times a day (BID) | ORAL | 0 refills | Status: DC

## 2018-03-31 MED ORDER — VITAMIN B-12 1000 MCG PO TABS
1000.0000 ug | ORAL_TABLET | Freq: Every day | ORAL | Status: DC
  Administered 2018-03-31: 1000 ug via ORAL
  Filled 2018-03-31: qty 1

## 2018-03-31 MED ORDER — FERROUS SULFATE 325 (65 FE) MG PO TABS: 325.0000 mg | ORAL_TABLET | Freq: Two times a day (BID) | ORAL | 0 refills | Status: DC

## 2018-03-31 NOTE — Discharge Summary (Addendum)
Physician Discharge Summary  Kathrina Crosley TIR:[id number] DOB: [birth year] DOA: 03/28/2018  PCP: Emeterio Reeve, DO  Admit date: 03/28/2018 Discharge date: 03/31/2018  Admitted From: Home Disposition: Home  Recommendations for Outpatient Follow-up:  1. Follow up with PCP in 1 week.  Please monitor H&H during outpatient follow-up and discuss EGD with biopsy results. She will need referral to oncology as outpatient. 2. Follow-up with GI as outpatient in about 2 weeks. 3. patient ill be called from the cancer center with appt scheduled   Home Health: None Equipment/Devices: None  Discharge Condition: Fair CODE STATUS: Full code Diet recommendation: Regular    Discharge Diagnoses:  Principal Problem:   Acute gastric ulcer without hemorrhage, perforation or obstruction   Active Problems:  adenocarcinoma of stomach   Essential hypertension   History of breast cancer   Hypertriglyceridemia   Hypothyroidism   Moderate intermittent asthma without complication   Acute upper GI bleed   Symptomatic anemia   B12 deficiency  Brief narrative/HPI 58 year old female with hypothyroidism, mild intermittent asthma, hypertension to the ED by PCP with low hemoglobin with symptoms of fatigue and dizziness with intermittent headache, palpitation and exertional dyspnea for almost 3 weeks.  Her hemoglobin was 10.4 about 6 months back during routine physical.  Also reported vague chest discomfort on ambulation.  Patient did notice dark stool but no bright red blood per rectum, hematemesis or hemoptysis. In the ED hemoglobin was 4.4 with iron panel showing severe iron deficiency anemia.  Received 3 unit PRBC with improvement in her hemoglobin to 8.7. Seen by GI and underwent EGD and colonoscopy on 1/12..   Principal Problem:   Severe iron deficiency/acute blood loss anemia secondary to upper GI bleed. 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  No active bleeding  noted.  Colonoscopy was normal. EGD with biopsy showing adenocarcinoma.  (Patient was already discharged before I receive the results). I have spoken with oncologist Dr. Alvy Bimler , patient will be arranged for out patient follow-up once sent through the schedule navigator and tumor board.   Received 3 unit PRBC with hemoglobin improved to 8.5,   received IV Protonix and will be discharged on oral Protonix twice daily. Also prescribed iron supplement. Tolerating advance diet. Patient instructed to follow-up with her PCP within 1 week and have her H&H monitored and biopsy from EGD to be reviewed and discussed by her PCP. She will should also follow-up with [facility] in 2 weeks.  I have discussed the biopsy result finding with her on the phone and informed her that further testing and follow-up will be done as outpatient.   Active Problems:   Essential hypertension Stable.  Continue HCTZ  Hypothyroidism Continue Synthroid     Moderate intermittent asthma without complication Stable.  Continue albuterol inhaler and PRN nebs      Family Communication  : None at bedside  Disposition Plan  : Home  Consults  : [facility]  Procedures  : EGD/Colonoscopy   Discharge Instructions   Allergies as of 03/31/2018   No Known Allergies     Medication List    TAKE these medications   acetaminophen 500 MG tablet Commonly known as:  TYLENOL Take 1,000 mg by mouth every 6 (six) hours as needed for mild pain.   acyclovir 400 MG tablet Commonly known as:  ZOVIRAX Take 1 tablet (400 mg total) by mouth 3 (three) times daily. For five days. As needed for cold sore.   albuterol 108 (90 Base) MCG/ACT inhaler  Commonly known as:  PROVENTIL HFA;VENTOLIN HFA Inhale 1-2 puffs into the lungs every 4 (four) hours as needed for wheezing or shortness of breath.   budesonide-formoterol 160-4.5 MCG/ACT inhaler Commonly known as:  SYMBICORT Inhale 2 puffs into the lungs 2 (two) times  daily.   cyanocobalamin 1000 MCG tablet Take 1 tablet (1,000 mcg total) by mouth daily. Start taking on:  April 01, 2018   ferrous sulfate 325 (65 FE) MG tablet Take 1 tablet (325 mg total) by mouth 2 (two) times daily with a meal.   hydrochlorothiazide 25 MG tablet Commonly known as:  HYDRODIURIL TAKE 1 TABLET BY MOUTH ONCE DAILY   levothyroxine 150 MCG tablet Commonly known as:  SYNTHROID, LEVOTHROID Take 1 tablet (150 mcg total) by mouth daily before breakfast.   pantoprazole 40 MG tablet Commonly known as:  PROTONIX Take 1 tablet (40 mg total) by mouth 2 (two) times daily before a meal for 30 days.      Follow-up Information    Emeterio Reeve, DO. Schedule an appointment as soon as possible for a visit in 1 week(s).   Specialty:  Osteopathic Medicine Contact information: [address] [place] [address] [phone]        Wilford Corner, MD Follow up in 2 week(s).   Specialty:  Gastroenterology Contact information: [address] [place] [address] [phone]          No Known Allergies    Procedures/Studies: Dg Ribs Unilateral W/chest Right  Result Date: 03/19/2018 CLINICAL DATA:  Right rib pain, bruising EXAM: RIGHT RIBS AND CHEST - 3+ VIEW COMPARISON:  04/27/2014 FINDINGS: Heart and mediastinal contours are within normal limits. No focal opacities or effusions. No acute bony abnormality. No visible fracture or focal rib lesion. Surgical clips in the right breast and axilla. IMPRESSION: No active cardiopulmonary disease. Electronically Signed   By: Rolm Baptise M.D.   On: 03/19/2018 12:43      Subjective: Denies any symptoms.  No overnight events.   Discharge Exam: Vitals:   03/30/18 1811 03/31/18 0809  BP: 118/67 118/68  Pulse: 72 70  Resp: 18 20  Temp: 97.8 F (36.6 C) 97.7 F (36.5 C)  SpO2: 100% 97%   Vitals:   03/30/18 0819 03/30/18 1811 03/31/18 0500 03/31/18 0809  BP:  118/67  118/68   Pulse: 69 72  70  Resp: 16 18  20   Temp:  97.8 F (36.6 C)  97.7 F (36.5 C)  TempSrc:    Oral  SpO2: 94% 100%  97%  Weight:   78.3 kg   Height:        General: Middle-aged female not in distress HEENT: Pallor present, no icterus, moist mucosa, supple neck Chest: Clear bilaterally CVS: Normal S1-S2, no murmurs GI: Soft, nondistended, nontender Musculoskeletal: Warm, no edema    The results of significant diagnostics from this hospitalization (including imaging, microbiology, ancillary and laboratory) are listed below for reference.     Microbiology: No results found for this or any previous visit (from the past 240 hour(s)).   Labs: BNP (last 3 results) Recent Labs    03/27/18 1512  BNP 45   Basic Metabolic Panel: Recent Labs  Lab 03/27/18 1512 03/28/18 0621  NA 137 137  K 3.6 3.2*  CL 101 102  CO2 26 24  GLUCOSE 87 107*  BUN 17 18  CREATININE 0.74 0.78  CALCIUM 9.0 8.9   Liver Function Tests: Recent Labs  Lab 03/27/18  1512 03/28/18 0621  AST 19 24  ALT 14 20  ALKPHOS  --  62  BILITOT 0.2 0.3  PROT 7.2 7.1  ALBUMIN  --  3.2*   No results for input(s): LIPASE, AMYLASE in the last 168 hours. No results for input(s): AMMONIA in the last 168 hours. CBC: Recent Labs  Lab 03/27/18 1512 03/28/18 0621 03/28/18 2113 03/30/18 0246 03/31/18 0305  WBC 8.8 7.0 8.9 6.2 6.4  HGB 4.7* 4.4* 8.7* 7.5* 8.0*  HCT 15.4* 15.7* 27.9* 23.7* 25.4*  MCV 83.7 89.7 85.8 88.1 88.2  PLT 313 267 254 208 223   Cardiac Enzymes: No results for input(s): CKTOTAL, CKMB, CKMBINDEX, TROPONINI in the last 168 hours. BNP: Invalid input(s): POCBNP CBG: No results for input(s): GLUCAP in the last 168 hours. D-Dimer No results for input(s): DDIMER in the last 72 hours. Hgb A1c No results for input(s): HGBA1C in the last 72 hours. Lipid Profile No results for input(s): CHOL, HDL, LDLCALC, TRIG, CHOLHDL, LDLDIRECT in the last 72 hours. Thyroid function studies No results  for input(s): TSH, T4TOTAL, T3FREE, THYROIDAB in the last 72 hours.  Invalid input(s): FREET3 Anemia work up No results for input(s): VITAMINB12, FOLATE, FERRITIN, TIBC, IRON, RETICCTPCT in the last 72 hours. Urinalysis No results found for: COLORURINE, APPEARANCEUR, LABSPEC, [place], GLUCOSEU, HGBUR, BILIRUBINUR, KETONESUR, PROTEINUR, UROBILINOGEN, NITRITE, LEUKOCYTESUR Sepsis Labs Invalid input(s): PROCALCITONIN,  WBC,  LACTICIDVEN Microbiology No results found for this or any previous visit (from the past 240 hour(s)).   Time coordinating discharge: 35 minutes  SIGNED:   Louellen Molder, MD  [facility] 03/31/2018, 9:05 AM Pager   If 7PM-7AM, please contact night-coverage [email or web address] Password [email or web address]

## 2018-03-31 NOTE — Telephone Encounter (Signed)
  Oncology Nurse Navigator Documentation    Patient returned my call to schedule new consult. Patient is scheduled for CT scans on 04/02/18. Patient agreed to initial consult with Dr. Burr Medico on 04/03/17 @ 8 AM.  Dr. Michail Sermon placed orders for CT scan this afternoon.

## 2018-03-31 NOTE — Discharge Instructions (Signed)
Gastrointestinal Bleeding ° °Gastrointestinal bleeding is bleeding somewhere along the path food travels through the body (digestive tract). This path is anywhere between the mouth and the opening of the butt (anus). You may have blood in your poop (stools) or have black poop. If you throw up (vomit), there may be blood in it. °This condition can be mild, serious, or even life-threatening. If you have a lot of bleeding, you may need to stay in the hospital. °Follow these instructions at home: °· Take over-the-counter and prescription medicines only as told by your doctor. °· Eat foods that have a lot of fiber in them. These foods include whole grains, fruits, and vegetables. You can also try eating 1-3 prunes each day. °· Drink enough fluid to keep your pee (urine) clear or pale yellow. °· Keep all follow-up visits as told by your doctor. This is important. °Contact a doctor if: °· Your symptoms do not get better. °Get help right away if: °· Your bleeding gets worse. °· You feel dizzy or you pass out (faint). °· You feel weak. °· You have very bad cramps in your back or belly (abdomen). °· You pass large clumps of blood (clots) in your poop. °· Your symptoms are getting worse. °This information is not intended to replace advice given to you by your health care provider. Make sure you discuss any questions you have with your health care provider. °Document Released: 12/12/2007 Document Revised: 08/10/2015 Document Reviewed: 08/22/2014 °Elsevier Interactive Patient Education © 2019 [employer] ° °

## 2018-04-01 ENCOUNTER — Telehealth: Payer: Self-pay

## 2018-04-01 LAB — TYPE AND SCREEN
ABO/RH(D): B POS
Antibody Screen: NEGATIVE
UNIT DIVISION: 0
Unit division: 0
Unit division: 0
Unit division: 0
Unit division: 0
Unit division: 0
Unit division: 0
Unit division: 0

## 2018-04-01 LAB — BPAM RBC
BLOOD PRODUCT EXPIRATION DATE: 202002082359
Blood Product Expiration Date: 202001182359
Blood Product Expiration Date: 202001302359
Blood Product Expiration Date: 202002012359
Blood Product Expiration Date: 202002032359
Blood Product Expiration Date: 202002082359
Blood Product Expiration Date: 202002092359
Blood Product Expiration Date: 202002092359
ISSUE DATE / TIME: 202001110904
ISSUE DATE / TIME: 202001111306
ISSUE DATE / TIME: 202001111325
ISSUE DATE / TIME: 202001111534
ISSUE DATE / TIME: 202001111755
ISSUE DATE / TIME: 202001112034
UNIT TYPE AND RH: 7300
Unit Type and Rh: 1700
Unit Type and Rh: 5100
Unit Type and Rh: 5100
Unit Type and Rh: 5100
Unit Type and Rh: 7300
Unit Type and Rh: 7300
Unit Type and Rh: 7300

## 2018-04-01 NOTE — Telephone Encounter (Signed)
Received fax from on-call nurse stating pt called office 04/01/2018 at 2:30 stating:  "caller states she has been having heart palpitations recently. She was in the hospital and she found out she has stomach cancer. Her fingers are numb and her heart is beating irregularly again"  Per fax, on call nurse advised pt to go to [facility]. Call was placed but no answer, so msg was left to make sure she went to ER. Asked for call back   FYI to PCP

## 2018-04-01 NOTE — Progress Notes (Addendum)
[place]   Telephone:([phone]) [phone] Fax:(336) [phone]   Clinic New Consult Note   Patient Care Team: Emeterio Reeve, DO as PCP - General (Osteopathic Medicine)  Date of Service:  04/03/2018   REFERRAL PHYSICIAN: Dr. Clementeen Graham from hospital discharge   CHIEF COMPLAINTS/PURPOSE OF CONSULTATION:  Newly Diagnosed Gastric Cancer   Oncology History   Cancer Staging Gastric cancer [facility]) Staging form: Stomach, AJCC 8th Edition - Clinical stage from 03/29/2018: Stage IVA (cT4b, cN1, cM0) - Signed by [name], MD on 04/03/2018       Gastric cancer ([place])   03/19/2018 Procedure    Upper Endoscopy by Dr. Michail Sermon 03/29/18  IMPRESSION - Normal esophagus. - Z-line regular, 38 cm from the incisors. - Non-obstructing oozing gastric ulcer with pigmented material. Biopsied. - Normal examined duodenum. - Acute gastritis.    03/29/2018 Procedure    Colonoscopy by Dr. Michail Sermon 03/29/18 IMPRESSION - Preparation of the colon was fair. - Internal hemorrhoids. - The examined portion of the ileum was normal. - No specimens collected.    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 - SEE COMMENT. Microscopic Comment A Warthin Starry stain is negative for the presence of Helicobacter pylori organisms. Dr Vic Ripper has reviewed the case and concurs with this interpretation. Dr Michail Sermon was paged on 03/31/2018. (JBK:ecj 03/31/2018)    03/29/2018 Cancer Staging    Staging form: Stomach, AJCC 8th Edition - Clinical stage from 03/29/2018: Stage IVA (cT4b, cN1, cM0) - Signed by [name], MD on 04/03/2018    04/02/2018 Initial Diagnosis    Gastric cancer ([place])    04/02/2018 Imaging    CT CAP W Contrast 04/02/18 IMPRESSION: 1. Focal thickening lateral wall proximal stomach with protrusion of low-attenuation soft tissue beyond the expected confines of the gastric wall into the splenic hilum. Imaging features  highly concerning for transmural tumor extension into the splenic hilum. Several small nodules in this region are likely lymph nodes, concerning for metastatic disease. 2. Scattered bilateral pulmonary nodules measuring up to 5 mm. Nonspecific, but close attention on follow-up recommended as metastatic disease not excluded. 3. Small lymph nodes in the gastrohepatic ligament, but there is no gastrohepatic or hepato duodenal ligament lymphadenopathy. No evidence for liver metastases. 4.  Aortic Atherosclerois (ICD10-170.0      HISTORY OF PRESENTING ILLNESS:  Mariah Kemp 58 y.o. female is a here because of Newly diagnosed Gastric Cancer. The patient presents to the clinic today accompanied by her husband.  She notes about 1 month ago she felt heart palpitation and fatigue. This concerned her. She went to her PCP's fellow. Her workup showed significant anemia with 4.4 Hg which prompted her to go to the ED. With anemia she was able to remain active but slower.   Today the patient notes having black stool starting 2 weeks ago. She denies stomach pain but does notes abdominal bloating. She is only eating slightly less than before. She notes she recently lost weight on purpose. She notes pounding headaches with anemia   Socially she is not a drinker and stopped smoking cigarette after 15 years. She works as a Community education officer. She is marries with 2 grown children. She wonders can she return to work for now.   They have a PMHx of HTN, thyroid disease. H/o stage 3 right breast cancer in 2008 with right mastectomy and reconstruction and chemotherapy for 5 months.She had 3 LN removed and took Tamoxifen for at least 5  years. This was done in [place] by [facility]. She is s/p total hysterectomy and BSO. She notes being put on Symbicort due to SOB.    REVIEW OF SYSTEMS:    Constitutional: Denies fevers, chills (+) purposeful weight loss (+) headaches  Eyes: Denies blurriness of vision,  double vision or watery eyes Ears, nose, mouth, throat, and face: Denies mucositis or sore throat Respiratory: Denies cough, dyspnea or wheezes Cardiovascular: Denies palpitation, chest discomfort or lower extremity swelling Gastrointestinal:  Denies nausea, heartburn or change in bowel habits (+) abdominal bloating (+) black stool Skin: Denies abnormal skin rashes Lymphatics: Denies new lymphadenopathy or easy bruising Neurological:Denies numbness, tingling or new weaknesses Behavioral/Psych: Mood is stable, no new changes  All other systems were reviewed with the patient and are negative.   MEDICAL HISTORY:  Past Medical History:  Diagnosis Date  . Hypertension   . Thyroid disease     SURGICAL HISTORY: Past Surgical History:  Procedure Laterality Date  . ABDOMINAL HYSTERECTOMY  1996  . BILATERAL SALPINGOOPHORECTOMY  2007  . BIOPSY  03/29/2018   Procedure: BIOPSY;  Surgeon: Wilford Corner, MD;  Location: [place];  Service: Endoscopy;;  . CARPAL TUNNEL RELEASE    . COLONOSCOPY N/A 03/29/2018   Procedure: COLONOSCOPY;  Surgeon: Wilford Corner, MD;  Location: [facility] ENDOSCOPY;  Service: Endoscopy;  Laterality: N/A;  . ESOPHAGOGASTRODUODENOSCOPY N/A 03/29/2018   Procedure: ESOPHAGOGASTRODUODENOSCOPY (EGD);  Surgeon: Wilford Corner, MD;  Location: [place];  Service: Endoscopy;  Laterality: N/A;  . MASTECTOMY  2007    SOCIAL HISTORY: Social History   Socioeconomic History  . Marital status: Married    Spouse name: Not on file  . Number of children: 2  . Years of education: Not on file  . Highest education level: Not on file  Occupational History  . Not on file  Social Needs  . Financial resource strain: Not on file  . Food insecurity:    Worry: Not on file    Inability: Not on file  . Transportation needs:    Medical: Not on file    Non-medical: Not on file  Tobacco Use  . Smoking status: Former Smoker    Packs/day: 0.50    Years: 15.00    Pack years:  7.50    Last attempt to quit: 04/27/1994    Years since quitting: 23.9  . Smokeless tobacco: Never Used  Substance and Sexual Activity  . Alcohol use: No    Alcohol/week: 0.0 standard drinks  . Drug use: No  . Sexual activity: Never    Partners: Male  Lifestyle  . Physical activity:    Days per week: Not on file    Minutes per session: Not on file  . Stress: Not on file  Relationships  . Social connections:    Talks on phone: Not on file    Gets together: Not on file    Attends religious service: Not on file    Active member of club or organization: Not on file    Attends meetings of clubs or organizations: Not on file    Relationship status: Not on file  . Intimate partner violence:    Fear of current or ex partner: Not on file    Emotionally abused: Not on file    Physically abused: Not on file    Forced sexual activity: Not on file  Other Topics Concern  . Not on file  Social History Narrative  . Not on file  FAMILY HISTORY: Family History  Problem Relation Age of Onset  . Cancer Father        liver CA  . Cancer Paternal Uncle        unknown type cancer    ALLERGIES:  has No Known Allergies.  MEDICATIONS:  Current Outpatient Medications  Medication Sig Dispense Refill  . acetaminophen (TYLENOL) 500 MG tablet Take 1,000 mg by mouth every 6 (six) hours as needed for mild pain.    Marland Kitchen acyclovir (ZOVIRAX) 400 MG tablet Take 1 tablet (400 mg total) by mouth 3 (three) times daily. For five days. As needed for cold sore. 15 tablet 0  . albuterol (PROVENTIL HFA;VENTOLIN HFA) 108 (90 Base) MCG/ACT inhaler Inhale 1-2 puffs into the lungs every 4 (four) hours as needed for wheezing or shortness of breath. 2 Inhaler 3  . budesonide-formoterol (SYMBICORT) 160-4.5 MCG/ACT inhaler Inhale 2 puffs into the lungs 2 (two) times daily. 2 Inhaler 3  . ferrous sulfate 325 (65 FE) MG tablet Take 1 tablet (325 mg total) by mouth 2 (two) times daily with a meal. 90 tablet 0  .  hydrochlorothiazide (HYDRODIURIL) 25 MG tablet TAKE 1 TABLET BY MOUTH ONCE DAILY 90 tablet 1  . levothyroxine (SYNTHROID, LEVOTHROID) 150 MCG tablet Take 1 tablet (150 mcg total) by mouth daily before breakfast. 90 tablet 3  . pantoprazole (PROTONIX) 40 MG tablet Take 1 tablet (40 mg total) by mouth 2 (two) times daily before a meal for 30 days. 60 tablet 0  . vitamin B-12 1000 MCG tablet Take 1 tablet (1,000 mcg total) by mouth daily. 30 tablet 0   No current facility-administered medications for this visit.     PHYSICAL EXAMINATION: EOG PERFORMANCE STATUS: 1 - Symptomatic but completely ambulatory  Vitals:   04/03/18 0806  BP: 133/75  Pulse: 66  Resp: 18  Temp: 98.2 F (36.8 C)  SpO2: 100%   Filed Weights   04/03/18 0806  Weight: 166 lb 3.2 oz (75.4 kg)    GENERAL:alert, no distress and comfortable SKIN: skin color, texture, turgor are normal, no rashes or significant lesions EYES: normal, conjunctiva are pink and non-injected, sclera clear OROPHARYNX:no exudate, no erythema and lips, buccal mucosa, and tongue normal  NECK: supple, thyroid normal size, non-tender, without nodularity LYMPH:  no palpable lymphadenopathy in the cervical, axillary or inguinal LUNGS: clear to auscultation and percussion with normal breathing effort HEART: regular rate & rhythm and no murmurs and no lower extremity edema ABDOMEN:abdomen soft, non-tender and normal bowel sounds (+) Epigastric and  Mid-left abdominal tenderness Musculoskeletal:no cyanosis of digits and no clubbing  PSYCH: alert & oriented x 3 with fluent speech NEURO: no focal motor/sensory deficits  LABORATORY DATA:  I have reviewed the data as listed CBC Latest Ref Rng & Units 03/31/2018 03/30/2018 03/28/2018  WBC 4.0 - 10.5 K/uL 6.4 6.2 8.9  Hemoglobin 12.0 - 15.0 g/dL 8.0(L) 7.5(L) 8.7(L)  Hematocrit 36.0 - 46.0 % 25.4(L) 23.7(L) 27.9(L)  Platelets 150 - 400 K/uL 223 208 254    CMP Latest Ref Rng & Units 03/28/2018 03/27/2018  10/08/2017  Glucose 70 - 99 mg/dL 107(H) 87 100(H)  BUN 6 - 20 mg/dL 18 17 24   Creatinine 0.44 - 1.00 mg/dL 0.78 0.74 0.70  Sodium 135 - 145 mmol/L 137 137 137  Potassium 3.5 - 5.1 mmol/L 3.2(L) 3.6 3.8  Chloride 98 - 111 mmol/L 102 101 102  CO2 22 - 32 mmol/L 24 26 29   Calcium 8.9 - 81.2 mg/dL 8.9 9.0  9.5  Total Protein 6.5 - 8.1 g/dL 7.1 7.2 7.1  Total Bilirubin 0.3 - 1.2 mg/dL 0.3 0.2 0.3  Alkaline Phos 38 - 126 U/L 62 - -  AST 15 - 41 U/L 24 19 18   ALT 0 - 44 U/L 20 14 15      RADIOGRAPHIC STUDIES: I have personally reviewed the radiological images as listed and agreed with the findings in the report. Dg Ribs Unilateral W/chest Right  Result Date: 03/19/2018 CLINICAL DATA:  Right rib pain, bruising EXAM: RIGHT RIBS AND CHEST - 3+ VIEW COMPARISON:  04/27/2014 FINDINGS: Heart and mediastinal contours are within normal limits. No focal opacities or effusions. No acute bony abnormality. No visible fracture or focal rib lesion. Surgical clips in the right breast and axilla. IMPRESSION: No active cardiopulmonary disease. Electronically Signed   By: Rolm Baptise M.D.   On: 03/19/2018 12:43   Ct Chest W Contrast  Result Date: 04/02/2018 CLINICAL DATA:  Stomach cancer.  History of breast cancer.  Anemia. EXAM: CT CHEST, ABDOMEN, AND PELVIS WITH CONTRAST TECHNIQUE: Multidetector CT imaging of the chest, abdomen and pelvis was performed following the standard protocol during bolus administration of intravenous contrast. CONTRAST:  138mL ISOVUE-300 IOPAMIDOL (ISOVUE-300) INJECTION 61% COMPARISON:  None. FINDINGS: CT CHEST FINDINGS Cardiovascular: The heart size is normal. No substantial pericardial effusion. Coronary artery calcification is evident. Atherosclerotic calcification is noted in the wall of the thoracic aorta. Mediastinum/Nodes: No mediastinal lymphadenopathy. There is no hilar lymphadenopathy. The esophagus has normal imaging features. There is no axillary lymphadenopathy. Lungs/Pleura:  The central tracheobronchial airways are patent. Subpleural scarring noted in the upper lungs bilaterally. 4 mm right perifissural nodule identified on 59/4. 5 mm right lower lobe nodule is identified on 65/4. Another tiny 2-3 mm right lower lobe nodule is visible on 70/4. Probable atelectasis anterior left upper lobe. 3 mm left lower lobe nodule identified on 68/4. Several other scattered tiny pulmonary nodules are noted bilaterally. There is no focal consolidation or pleural effusion. Musculoskeletal: No worrisome lytic or sclerotic osseous abnormality. Patient is status post right mastectomy with tram flap reconstruction. CT ABDOMEN PELVIS FINDINGS Hepatobiliary: No focal abnormality within the liver parenchyma. Gallbladder decompressed but otherwise unremarkable. No intrahepatic or extrahepatic biliary dilation. Pancreas: No focal mass lesion. No dilatation of the main duct. No intraparenchymal cyst. No peripancreatic edema. Spleen: No splenomegaly. Abnormal soft tissue is identified in the splenic hilum (see stomach section below). Adrenals/Urinary Tract: No adrenal nodule or mass. Kidneys unremarkable. No evidence for hydroureter. The urinary bladder appears normal for the degree of distention. Stomach/Bowel: Focal thickening is noted in the lateral wall of the proximal stomach. There is a protrusion of low-attenuation soft tissue beyond the expected confines of the gastric wall into the splenic hilum (52/2). Multiple nodules are seen between the stomach and spleen, cranial to the tail of pancreas. Distal stomach unremarkable. Duodenum is normally positioned as is the ligament of Treitz. No small bowel wall thickening. No small bowel dilatation. The terminal ileum is normal. The appendix is normal. No gross colonic mass. No colonic wall thickening. Diverticular changes are noted in the left colon without evidence of diverticulitis. Vascular/Lymphatic: There is abdominal aortic atherosclerosis without aneurysm.  There is no hepatoduodenal ligament lymphadenopathy. No intraperitoneal or retroperitoneal lymphadenopathy. Small lymph nodes are seen in the gastrohepatic ligament. No pelvic sidewall lymphadenopathy. Reproductive: Uterus surgically absent.  There is no adnexal mass. Other: No intraperitoneal free fluid. Musculoskeletal: No worrisome lytic or sclerotic osseous abnormality. CLINICAL DATA:  Stomach cancer.  History of breast cancer. Anemia. EXAM: CT CHEST, ABDOMEN, AND PELVIS WITH CONTRAST TECHNIQUE: Multidetector CT imaging of the chest, abdomen and pelvis was performed following the standard protocol during bolus administration of intravenous contrast. CONTRAST:  158mL ISOVUE-300 IOPAMIDOL (ISOVUE-300) INJECTION 61% COMPARISON:  None. FINDINGS: CT CHEST FINDINGS Cardiovascular: The heart size is normal. No substantial pericardial effusion. Coronary artery calcification is evident. Atherosclerotic calcification is noted in the wall of the thoracic aorta. Mediastinum/Nodes: No mediastinal lymphadenopathy. There is no hilar lymphadenopathy. The esophagus has normal imaging features. There is no axillary lymphadenopathy. Lungs/Pleura: The central tracheobronchial airways are patent. Subpleural scarring noted in the upper lungs bilaterally. 4 mm right perifissural nodule identified on 59/4. 5 mm right lower lobe nodule is identified on 65/4. Another tiny 2-3 mm right lower lobe nodule is visible on 70/4. Probable atelectasis anterior left upper lobe. 3 mm left lower lobe nodule identified on 68/4. Several other scattered tiny pulmonary nodules are noted bilaterally. There is no focal consolidation or pleural effusion. Musculoskeletal: No worrisome lytic or sclerotic osseous abnormality. Patient is status post right mastectomy with tram flap reconstruction. CT ABDOMEN PELVIS FINDINGS Hepatobiliary: No focal abnormality within the liver parenchyma. Gallbladder decompressed but otherwise unremarkable. No intrahepatic or  extrahepatic biliary dilation. Pancreas: No focal mass lesion. No dilatation of the main duct. No intraparenchymal cyst. No peripancreatic edema. Spleen: No splenomegaly. Abnormal soft tissue is identified in the splenic hilum (see stomach section below). Adrenals/Urinary Tract: No adrenal nodule or mass. Kidneys unremarkable. No evidence for hydroureter. The urinary bladder appears normal for the degree of distention. Stomach/Bowel: Focal thickening is noted in the lateral wall of the proximal stomach. There is a protrusion of low-attenuation soft tissue beyond the expected confines of the gastric wall into the splenic hilum (52/2). Multiple nodules are seen between the stomach and spleen, cranial to the tail of pancreas. Distal stomach unremarkable. Duodenum is normally positioned as is the ligament of Treitz. No small bowel wall thickening. No small bowel dilatation. The terminal ileum is normal. The appendix is normal. No gross colonic mass. No colonic wall thickening. Diverticular changes are noted in the left colon without evidence of diverticulitis. Vascular/Lymphatic: There is abdominal aortic atherosclerosis without aneurysm. There is no hepatoduodenal ligament lymphadenopathy. No intraperitoneal or retroperitoneal lymphadenopathy. Small lymph nodes are seen in the gastrohepatic ligament. No pelvic sidewall lymphadenopathy. Reproductive: Uterus surgically absent.  There is no adnexal mass. Other: No intraperitoneal free fluid. Musculoskeletal: No worrisome lytic or sclerotic osseous abnormality. IMPRESSION: 1. Focal thickening lateral wall proximal stomach with protrusion of low-attenuation soft tissue beyond the expected confines of the gastric wall into the splenic hilum. Imaging features highly concerning for transmural tumor extension into the splenic hilum. Several small nodules in this region are likely lymph nodes, concerning for metastatic disease. 2. Scattered bilateral pulmonary nodules measuring  up to 5 mm. Nonspecific, but close attention on follow-up recommended as metastatic disease not excluded. 3. Small lymph nodes in the gastrohepatic ligament, but there is no gastrohepatic or hepato duodenal ligament lymphadenopathy. No evidence for liver metastases. 4.  Aortic Atherosclerois (ICD10-170.0) Electronically Signed   By: Misty Stanley M.D.   On: 04/02/2018 16:13   Ct Abdomen Pelvis W Contrast  Result Date: 04/02/2018 CLINICAL DATA:  Stomach cancer.  History of breast cancer.  Anemia. EXAM: CT CHEST, ABDOMEN, AND PELVIS WITH CONTRAST TECHNIQUE: Multidetector CT imaging of the chest, abdomen and pelvis was performed following the standard protocol during bolus administration of intravenous contrast. CONTRAST:  181mL ISOVUE-300  IOPAMIDOL (ISOVUE-300) INJECTION 61% COMPARISON:  None. FINDINGS: CT CHEST FINDINGS Cardiovascular: The heart size is normal. No substantial pericardial effusion. Coronary artery calcification is evident. Atherosclerotic calcification is noted in the wall of the thoracic aorta. Mediastinum/Nodes: No mediastinal lymphadenopathy. There is no hilar lymphadenopathy. The esophagus has normal imaging features. There is no axillary lymphadenopathy. Lungs/Pleura: The central tracheobronchial airways are patent. Subpleural scarring noted in the upper lungs bilaterally. 4 mm right perifissural nodule identified on 59/4. 5 mm right lower lobe nodule is identified on 65/4. Another tiny 2-3 mm right lower lobe nodule is visible on 70/4. Probable atelectasis anterior left upper lobe. 3 mm left lower lobe nodule identified on 68/4. Several other scattered tiny pulmonary nodules are noted bilaterally. There is no focal consolidation or pleural effusion. Musculoskeletal: No worrisome lytic or sclerotic osseous abnormality. Patient is status post right mastectomy with tram flap reconstruction. CT ABDOMEN PELVIS FINDINGS Hepatobiliary: No focal abnormality within the liver parenchyma. Gallbladder  decompressed but otherwise unremarkable. No intrahepatic or extrahepatic biliary dilation. Pancreas: No focal mass lesion. No dilatation of the main duct. No intraparenchymal cyst. No peripancreatic edema. Spleen: No splenomegaly. Abnormal soft tissue is identified in the splenic hilum (see stomach section below). Adrenals/Urinary Tract: No adrenal nodule or mass. Kidneys unremarkable. No evidence for hydroureter. The urinary bladder appears normal for the degree of distention. Stomach/Bowel: Focal thickening is noted in the lateral wall of the proximal stomach. There is a protrusion of low-attenuation soft tissue beyond the expected confines of the gastric wall into the splenic hilum (52/2). Multiple nodules are seen between the stomach and spleen, cranial to the tail of pancreas. Distal stomach unremarkable. Duodenum is normally positioned as is the ligament of Treitz. No small bowel wall thickening. No small bowel dilatation. The terminal ileum is normal. The appendix is normal. No gross colonic mass. No colonic wall thickening. Diverticular changes are noted in the left colon without evidence of diverticulitis. Vascular/Lymphatic: There is abdominal aortic atherosclerosis without aneurysm. There is no hepatoduodenal ligament lymphadenopathy. No intraperitoneal or retroperitoneal lymphadenopathy. Small lymph nodes are seen in the gastrohepatic ligament. No pelvic sidewall lymphadenopathy. Reproductive: Uterus surgically absent.  There is no adnexal mass. Other: No intraperitoneal free fluid. Musculoskeletal: No worrisome lytic or sclerotic osseous abnormality. CLINICAL DATA:  Stomach cancer. History of breast cancer. Anemia. EXAM: CT CHEST, ABDOMEN, AND PELVIS WITH CONTRAST TECHNIQUE: Multidetector CT imaging of the chest, abdomen and pelvis was performed following the standard protocol during bolus administration of intravenous contrast. CONTRAST:  151mL ISOVUE-300 IOPAMIDOL (ISOVUE-300) INJECTION 61%  COMPARISON:  None. FINDINGS: CT CHEST FINDINGS Cardiovascular: The heart size is normal. No substantial pericardial effusion. Coronary artery calcification is evident. Atherosclerotic calcification is noted in the wall of the thoracic aorta. Mediastinum/Nodes: No mediastinal lymphadenopathy. There is no hilar lymphadenopathy. The esophagus has normal imaging features. There is no axillary lymphadenopathy. Lungs/Pleura: The central tracheobronchial airways are patent. Subpleural scarring noted in the upper lungs bilaterally. 4 mm right perifissural nodule identified on 59/4. 5 mm right lower lobe nodule is identified on 65/4. Another tiny 2-3 mm right lower lobe nodule is visible on 70/4. Probable atelectasis anterior left upper lobe. 3 mm left lower lobe nodule identified on 68/4. Several other scattered tiny pulmonary nodules are noted bilaterally. There is no focal consolidation or pleural effusion. Musculoskeletal: No worrisome lytic or sclerotic osseous abnormality. Patient is status post right mastectomy with tram flap reconstruction. CT ABDOMEN PELVIS FINDINGS Hepatobiliary: No focal abnormality within the liver parenchyma. Gallbladder decompressed but otherwise  unremarkable. No intrahepatic or extrahepatic biliary dilation. Pancreas: No focal mass lesion. No dilatation of the main duct. No intraparenchymal cyst. No peripancreatic edema. Spleen: No splenomegaly. Abnormal soft tissue is identified in the splenic hilum (see stomach section below). Adrenals/Urinary Tract: No adrenal nodule or mass. Kidneys unremarkable. No evidence for hydroureter. The urinary bladder appears normal for the degree of distention. Stomach/Bowel: Focal thickening is noted in the lateral wall of the proximal stomach. There is a protrusion of low-attenuation soft tissue beyond the expected confines of the gastric wall into the splenic hilum (52/2). Multiple nodules are seen between the stomach and spleen, cranial to the tail of  pancreas. Distal stomach unremarkable. Duodenum is normally positioned as is the ligament of Treitz. No small bowel wall thickening. No small bowel dilatation. The terminal ileum is normal. The appendix is normal. No gross colonic mass. No colonic wall thickening. Diverticular changes are noted in the left colon without evidence of diverticulitis. Vascular/Lymphatic: There is abdominal aortic atherosclerosis without aneurysm. There is no hepatoduodenal ligament lymphadenopathy. No intraperitoneal or retroperitoneal lymphadenopathy. Small lymph nodes are seen in the gastrohepatic ligament. No pelvic sidewall lymphadenopathy. Reproductive: Uterus surgically absent.  There is no adnexal mass. Other: No intraperitoneal free fluid. Musculoskeletal: No worrisome lytic or sclerotic osseous abnormality. IMPRESSION: 1. Focal thickening lateral wall proximal stomach with protrusion of low-attenuation soft tissue beyond the expected confines of the gastric wall into the splenic hilum. Imaging features highly concerning for transmural tumor extension into the splenic hilum. Several small nodules in this region are likely lymph nodes, concerning for metastatic disease. 2. Scattered bilateral pulmonary nodules measuring up to 5 mm. Nonspecific, but close attention on follow-up recommended as metastatic disease not excluded. 3. Small lymph nodes in the gastrohepatic ligament, but there is no gastrohepatic or hepato duodenal ligament lymphadenopathy. No evidence for liver metastases. 4.  Aortic Atherosclerois (ICD10-170.0) Electronically Signed   By: Misty Stanley M.D.   On: 04/02/2018 16:13    ASSESSMENT & PLAN:  Yared Susan is a 58 y.o. female with a history of HTN and Thyroid disease.   1. 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  Endoscopy showed a ulcerated lesion on the proximal lesser curvature of the stomach.  No carcinoma.    - I reviewed her endoscopy findings, biopsy results and CT CAP in great detail with her and her husband.  In person with patient and her husband.  Unfortunately the primary tumor has invading the spleen, is a T4 lesion, there is no evidence of distant metastasis, but shows locally advanced disease.  -I discussed surgery is the only way to cure this cancer. If she proceeds with surgery she will get spleen removed as well. I will refer her to Dr. Genella Mech to further discuss her option.  -she may need laparoscope to rule out peritoneal metastasis for staging. -Given her locally advanced disease I recommend neo-adjuvant chemo to shrink tumor before surgery. She is interested. I discussed the option of FOLFOX or FLOT4.  And in good health, I recommend FLOT4 regimen.   --Chemotherapy consent: Side effects including but does not limited to, fatigue, nausea, vomiting, diarrhea, hair loss, neuropathy, fluid retention, renal and kidney dysfunction, neutropenic fever, needed for blood transfusion, bleeding, cough coronary artery spasm and heart failure, were discussed with patient in great detail. She agrees to proceed.  -To chemotherapy, and has good response, we plan to do total neoadjuvant with 4 months of chemo.  -  Labs reviewed with patient . Will repeat labs with iron panel and Tumor Marker. -Will proceed with Chemo education class and port placement.  -Plan to scan after 4 cycles of treatment.   -I will present her case in our GI tumor conference next week to finalize her treatment plan.   2. Anemia, iron deficient from chronic GI bleeding. -Secondary to GI blood loss  -Given 3u blood transfusion on 04/01/18, responded moderately well.  -currently on oral iron daily. I recommend prenatal vitamin -I will give 2 doses of IV Iron for more aggressive correction. I discussed side effects including possible IV reactions Will monitor  She is agreeable.  -Will monitor closely with labs    3. H/o stage 3  right breast cancer in 2007 -Treated with right mastectomy, reconstruction with 3 LN removed. She underwent chemotherapy for 5 months, likely AC-T. She completed Tamoxifen for at least 5 years.  -Will try to obtain medical records  4. Genetic  -I disucssed her eligibility for genetic testing.  She has personal history of breast and gastric cancer.  She is interested.  I will refer her.    PLAN:  Dietician consult  Lab and iv feraheme next week and one week after Tumor board discussion next week, I will arrange chemo class, port and chemo after that  Referral to surgeon Dr. Barry Dienes    Orders Placed This Encounter  Procedures  . CBC with Differential (Cancer Center Only)    Standing Status:   Future    Standing Expiration Date:   04/04/2019  . Ferritin    Standing Status:   Future    Standing Expiration Date:   04/03/2019  . Iron and TIBC    Standing Status:   Future    Standing Expiration Date:   04/03/2019  . CEA (IN HOUSE-[facility])    Standing Status:   Future    Standing Expiration Date:   04/04/2019  . CA 19.9    Standing Status:   Future    Standing Expiration Date:   04/03/2019    All questions were answered. The patient knows to call the clinic with any problems, questions or concerns. I spent 55 minutes counseling the patient face to face. The total time spent in the appointment was 60 minutes and more than 50% was on counseling.     [name], MD 04/03/2018 5:18 PM  I, Joslyn Devon, am acting as scribe for [name], MD.   I have reviewed the above documentation for accuracy and completeness, and I agree with the above.

## 2018-04-01 NOTE — Telephone Encounter (Signed)
Noted, thanks!

## 2018-04-02 ENCOUNTER — Ambulatory Visit
Admission: RE | Admit: 2018-04-02 | Discharge: 2018-04-02 | Disposition: A | Discharge status: Home / Self Care | Payer: 59 | Source: Ambulatory Visit | Attending: Gastroenterology | Admitting: Gastroenterology

## 2018-04-02 DIAGNOSIS — C169 Malignant neoplasm of stomach, unspecified: Secondary | ICD-10-CM

## 2018-04-02 MED ORDER — IOPAMIDOL (ISOVUE-300) INJECTION 61%
100.0000 mL | Freq: Once | INTRAVENOUS | Status: AC | PRN
  Administered 2018-04-02: 100 mL via INTRAVENOUS

## 2018-04-03 ENCOUNTER — Inpatient Hospital Stay: Payer: 59 | Attending: Hematology | Admitting: Hematology

## 2018-04-03 ENCOUNTER — Telehealth: Payer: Self-pay | Admitting: General Practice

## 2018-04-03 ENCOUNTER — Encounter: Payer: Self-pay | Admitting: Hematology

## 2018-04-03 ENCOUNTER — Telehealth: Payer: Self-pay | Admitting: Hematology

## 2018-04-03 VITALS — BP 133/75 | HR 66 | Temp 98.2°F | Resp 18 | Ht 60.0 in | Wt 166.2 lb

## 2018-04-03 DIAGNOSIS — Z853 Personal history of malignant neoplasm of breast: Secondary | ICD-10-CM | POA: Diagnosis not present

## 2018-04-03 DIAGNOSIS — Z5189 Encounter for other specified aftercare: Secondary | ICD-10-CM | POA: Insufficient documentation

## 2018-04-03 DIAGNOSIS — C169 Malignant neoplasm of stomach, unspecified: Secondary | ICD-10-CM

## 2018-04-03 DIAGNOSIS — Z808 Family history of malignant neoplasm of other organs or systems: Secondary | ICD-10-CM | POA: Insufficient documentation

## 2018-04-03 DIAGNOSIS — D500 Iron deficiency anemia secondary to blood loss (chronic): Secondary | ICD-10-CM | POA: Diagnosis not present

## 2018-04-03 DIAGNOSIS — Z87891 Personal history of nicotine dependence: Secondary | ICD-10-CM | POA: Diagnosis not present

## 2018-04-03 DIAGNOSIS — K922 Gastrointestinal hemorrhage, unspecified: Secondary | ICD-10-CM | POA: Diagnosis not present

## 2018-04-03 DIAGNOSIS — Z5111 Encounter for antineoplastic chemotherapy: Secondary | ICD-10-CM | POA: Insufficient documentation

## 2018-04-03 DIAGNOSIS — Z809 Family history of malignant neoplasm, unspecified: Secondary | ICD-10-CM | POA: Insufficient documentation

## 2018-04-03 DIAGNOSIS — I10 Essential (primary) hypertension: Secondary | ICD-10-CM | POA: Diagnosis not present

## 2018-04-03 DIAGNOSIS — Z90710 Acquired absence of both cervix and uterus: Secondary | ICD-10-CM | POA: Diagnosis not present

## 2018-04-03 DIAGNOSIS — M899 Disorder of bone, unspecified: Secondary | ICD-10-CM | POA: Insufficient documentation

## 2018-04-03 DIAGNOSIS — Z9011 Acquired absence of right breast and nipple: Secondary | ICD-10-CM | POA: Diagnosis not present

## 2018-04-03 NOTE — Telephone Encounter (Signed)
Per 01/17 los scheduled IV iron and nutrition appt.  Printed calendar and vs.  Brion Aliment and she stated she would submit the referral for nutrition.

## 2018-04-03 NOTE — Telephone Encounter (Deleted)
Per 01/17 los scheduled IV iron appt and nutrition.  Printed calendar and avs.

## 2018-04-03 NOTE — Progress Notes (Signed)
  Oncology Nurse Navigator Documentation      Met with Mr. And Mrs. Parker. Explained role of nurse navigator.Referral made to social work for support. Spoke directly with Edwyna Shell LCSW about need for support.  Contact names and phone numbers were provided for entire [facility] team.  Teach back method was used.  No barriers to care identified at present time.  Will continue to follow as needed.

## 2018-04-03 NOTE — Telephone Encounter (Signed)
Pt has not returned call. Per Epic, pt has appt with Oncology today

## 2018-04-03 NOTE — Progress Notes (Signed)
START OFF PATHWAY REGIMEN - Gastroesophageal   [id number]:Docetaxel + Oxaliplatin + Leucovorin + 5-Fluorouracil CIV q14 days (FLOT):   A cycle is every 14 days:     Docetaxel      Oxaliplatin      Leucovorin      5-Fluorouracil   **Always confirm dose/schedule in your pharmacy ordering system**  Patient Characteristics: Gastric, Adenocarcinoma, Preoperative or Nonsurgical Candidate (Clinical Staging), cT3 or Higher or cN+, Unresectable/Nonsurgical Candidate (Any cT), Not a Radiation Candidate, HER2 Negative / Unknown Histology: Adenocarcinoma Disease Classification: Gastric Therapeutic Status: Preoperative or Nonsurgical Candidate (Clinical Staging) AJCC N Category: cN1 AJCC M Category: cM0 AJCC 8 Stage Grouping: IVA AJCC T Category: cT4b Patient Characteristics: Not a Radiation Candidate HER2 Status: Awaiting Test Results Intent of Therapy: Curative Intent, Discussed with Patient

## 2018-04-03 NOTE — Telephone Encounter (Signed)
[place] CSW Progress Notes  Referral received from [facility], nurse navigator, to reach out to patient to assess for support/resource needs.  Called and left VM introducing myself and [place], provided contact information and encouraged her to call at her convenience.  Edwyna Shell, LCSW Clinical Social Worker Phone:  [phone]

## 2018-04-03 NOTE — Addendum Note (Signed)
Addended by: [name] on: 04/03/2018 05:41 PM   Modules accepted: Orders

## 2018-04-06 ENCOUNTER — Telehealth: Payer: Self-pay

## 2018-04-06 NOTE — Telephone Encounter (Signed)
  Oncology Nurse Navigator Documentation   Scheduled chemo education class on 04/07/18 @ 2 PM. Called and left voice mail advising of date and time. Patient has my direct call back number for questions or concerns.

## 2018-04-07 ENCOUNTER — Inpatient Hospital Stay: Payer: 59

## 2018-04-07 ENCOUNTER — Telehealth: Payer: Self-pay | Admitting: *Deleted

## 2018-04-07 ENCOUNTER — Other Ambulatory Visit: Payer: Self-pay | Admitting: General Surgery

## 2018-04-07 NOTE — Telephone Encounter (Signed)
Telephone call to patient. Left message for return call. FMLA form completion- need to know if she is taking continuous or intermittent leave when she starts treatment.

## 2018-04-08 ENCOUNTER — Encounter: Payer: Self-pay | Admitting: Hematology

## 2018-04-08 ENCOUNTER — Encounter (HOSPITAL_COMMUNITY): Payer: Self-pay | Admitting: *Deleted

## 2018-04-08 ENCOUNTER — Other Ambulatory Visit: Payer: Self-pay

## 2018-04-08 ENCOUNTER — Inpatient Hospital Stay: Payer: 59

## 2018-04-08 VITALS — BP 124/73 | HR 74 | Temp 98.2°F | Resp 16

## 2018-04-08 DIAGNOSIS — C169 Malignant neoplasm of stomach, unspecified: Secondary | ICD-10-CM

## 2018-04-08 DIAGNOSIS — Z5111 Encounter for antineoplastic chemotherapy: Secondary | ICD-10-CM | POA: Diagnosis not present

## 2018-04-08 DIAGNOSIS — D500 Iron deficiency anemia secondary to blood loss (chronic): Secondary | ICD-10-CM

## 2018-04-08 LAB — IRON AND TIBC
Iron: 126 ug/dL (ref 41–142)
Saturation Ratios: 27 % (ref 21–57)
TIBC: 470 ug/dL — ABNORMAL HIGH (ref 236–444)
UIBC: 344 ug/dL (ref 120–384)

## 2018-04-08 LAB — FERRITIN: Ferritin: 45 ng/mL (ref 11–307)

## 2018-04-08 LAB — CBC WITH DIFFERENTIAL (CANCER CENTER ONLY)
Abs Immature Granulocytes: 0.01 10*3/uL (ref 0.00–0.07)
Basophils Absolute: 0 10*3/uL (ref 0.0–0.1)
Basophils Relative: 1 %
Eosinophils Absolute: 0.3 10*3/uL (ref 0.0–0.5)
Eosinophils Relative: 5 %
HCT: 28.5 % — ABNORMAL LOW (ref 36.0–46.0)
Hemoglobin: 9.1 g/dL — ABNORMAL LOW (ref 12.0–15.0)
Immature Granulocytes: 0 %
Lymphocytes Relative: 27 %
Lymphs Abs: 1.6 10*3/uL (ref 0.7–4.0)
MCH: 28.3 pg (ref 26.0–34.0)
MCHC: 31.9 g/dL (ref 30.0–36.0)
MCV: 88.5 fL (ref 80.0–100.0)
Monocytes Absolute: 0.5 10*3/uL (ref 0.1–1.0)
Monocytes Relative: 8 %
NEUTROS PCT: 59 %
Neutro Abs: 3.4 10*3/uL (ref 1.7–7.7)
PLATELETS: 226 10*3/uL (ref 150–400)
RBC: 3.22 MIL/uL — ABNORMAL LOW (ref 3.87–5.11)
RDW: 15 % (ref 11.5–15.5)
WBC Count: 5.7 10*3/uL (ref 4.0–10.5)
nRBC: 0 % (ref 0.0–0.2)

## 2018-04-08 LAB — CEA (IN HOUSE-CHCC): CEA (CHCC-In House): 27.12 ng/mL — ABNORMAL HIGH (ref 0.00–5.00)

## 2018-04-08 MED ORDER — SODIUM CHLORIDE 0.9 % IV SOLN
510.0000 mg | Freq: Once | INTRAVENOUS | Status: AC
  Administered 2018-04-08: 510 mg via INTRAVENOUS
  Filled 2018-04-08: qty 17

## 2018-04-08 MED ORDER — SODIUM CHLORIDE 0.9 % IV SOLN
Freq: Once | INTRAVENOUS | Status: AC
  Administered 2018-04-08: 15:00:00 via INTRAVENOUS
  Filled 2018-04-08: qty 250

## 2018-04-08 NOTE — Progress Notes (Signed)
Met w/ pt to introduce myself as her Arboriculturist.  Unfortunately there aren't any foundations offering copay assistance for her Dx.  I offered the [employer], went over what it covers, showed her an expense sheet and gave her the income requirement.  Pt stated she exceeds the income requirement so she doesn't qualify for the grant at this time.  She has my card if her situation changes and for any questions or concerns she may have in the future.

## 2018-04-08 NOTE — Progress Notes (Signed)
Mariah Kemp  04/08/2018   Your procedure is scheduled on: 04/13/2018  Report to [facility] Main  Entrance  Report to admitting at   0800 AM    Call this number if you have problems the morning of surgery [phone]   Remember: Do not eat food    :After Midnight. BRUSH YOUR TEETH MORNING OF SURGERY AND RINSE YOUR MOUTH OUT, NO CHEWING GUM CANDY OR MINTS.   NO SOLID FOOD AFTER MIDNIGHT THE NIGHT PRIOR TO SURGERY. NOTHING BY MOUTH EXCEPT CLEAR LIQUIDS UNTIL 3 HOURS PRIOR TO [place] SURGERY. PLEASE FINISH ENSURE DRINK PER SURGEON ORDER 3 HOURS PRIOR TO SCHEDULED SURGERY TIME WHICH NEEDS TO BE COMPLETED AT ___0645am_________.   CLEAR LIQUID DIET   Foods Allowed                                                                     Foods Excluded  Coffee and tea, regular and decaf                             liquids that you cannot  Plain Jell-O in any flavor                                             see through such as: Fruit ices (not with fruit pulp)                                     milk, soups, orange juice  Iced Popsicles                                    All solid food Carbonated beverages, regular and diet                                    Cranberry, grape and apple juices Sports drinks like Gatorade Lightly seasoned clear broth or consume(fat free) Sugar, honey syrup                                                                                                            _____________________________________________________________________    Take these medicines the morning of surgery with A SIP OF WATER: Synthroid, Protonix, Inhalers as usual and bring  You may not have any metal on your body including hair pins and              piercings  Do not wear jewelry, make-up, lotions, powders or perfumes, deodorant             Do not wear nail polish.  Do not shave  48 hours prior to surgery.               Do  not bring valuables to the hospital. [place].  Contacts, dentures or bridgework may not be worn into surgery.      Patients discharged the day of surgery will not be allowed to drive home. IF YOU ARE HAVING SURGERY AND GOING HOME THE SAME DAY, YOU MUST HAVE AN ADULT TO DRIVE YOU HOME AND BE WITH YOU FOR 24 HOURS. YOU MAY GO HOME BY TAXI OR UBER OR ORTHERWISE, BUT AN ADULT MUST ACCOMPANY YOU HOME AND STAY WITH YOU FOR 24 HOURS.  Name and phone number of your driver:                Please read over the following fact sheets you were given: _____________________________________________________________________             [facility] - Preparing for Surgery Before surgery, you can play an important role.  Because skin is not sterile, your skin needs to be as free of germs as possible.  You can reduce the number of germs on your skin by washing with CHG (chlorahexidine gluconate) soap before surgery.  CHG is an antiseptic cleaner which kills germs and bonds with the skin to continue killing germs even after washing. Please DO NOT use if you have an allergy to CHG or antibacterial soaps.  If your skin becomes reddened/irritated stop using the CHG and inform your nurse when you arrive at Short Stay. Do not shave (including legs and underarms) for at least 48 hours prior to the first CHG shower.  You may shave your face/neck. Please follow these instructions carefully:  1.  Shower with CHG Soap the night before surgery and the  morning of Surgery.  2.  If you choose to wash your hair, wash your hair first as usual with your  normal  shampoo.  3.  After you shampoo, rinse your hair and body thoroughly to remove the  shampoo.                           4.  Use CHG as you would any other liquid soap.  You can apply chg directly  to the skin and wash                       Gently with a scrungie or clean washcloth.  5.  Apply the CHG Soap to your body  ONLY FROM THE NECK DOWN.   Do not use on face/ open                           Wound or open sores. Avoid contact with eyes, ears mouth and genitals (private parts).                       Wash face,  Genitals (private parts) with your normal soap.  6.  Wash thoroughly, paying special attention to the area where your surgery  will be performed.  7.  Thoroughly rinse your body with warm water from the neck down.  8.  DO NOT shower/wash with your normal soap after using and rinsing off  the CHG Soap.                9.  Pat yourself dry with a clean towel.            10.  Wear clean pajamas.            11.  Place clean sheets on your bed the night of your first shower and do not  sleep with pets. Day of Surgery : Do not apply any lotions/deodorants the morning of surgery.  Please wear clean clothes to the hospital/surgery center.  FAILURE TO FOLLOW THESE INSTRUCTIONS MAY RESULT IN THE CANCELLATION OF YOUR SURGERY PATIENT SIGNATURE_________________________________  NURSE SIGNATURE__________________________________  ________________________________________________________________________

## 2018-04-08 NOTE — Patient Instructions (Signed)

## 2018-04-09 ENCOUNTER — Telehealth: Payer: Self-pay | Admitting: General Practice

## 2018-04-09 ENCOUNTER — Ambulatory Visit (HOSPITAL_BASED_OUTPATIENT_CLINIC_OR_DEPARTMENT_OTHER)
Admission: RE | Admit: 2018-04-09 | Discharge: 2018-04-09 | Disposition: A | Discharge status: Home / Self Care | Payer: 59 | Source: Ambulatory Visit | Attending: Sports Medicine | Admitting: Sports Medicine

## 2018-04-09 ENCOUNTER — Encounter (HOSPITAL_COMMUNITY): Payer: Self-pay | Admitting: *Deleted

## 2018-04-09 DIAGNOSIS — R002 Palpitations: Secondary | ICD-10-CM | POA: Diagnosis present

## 2018-04-09 LAB — CANCER ANTIGEN 19-9: CA 19-9: 508 U/mL — ABNORMAL HIGH (ref 0–35)

## 2018-04-09 NOTE — Progress Notes (Signed)
  Echocardiogram 2D Echocardiogram has been performed.  Mariah Kemp 04/09/2018, 2:31 PM

## 2018-04-09 NOTE — Telephone Encounter (Signed)
[place] CSW Progress Notes  Check in call to patient, discussed normal anxiety present in treatment planning process.  Has multiple appointments, aware treatment team is working to optimize plan of care.  Dealing w stressors remarkably well, has experience dealing w previous cancer workup/treatments.  Discussed ways to garner support and decrease stress from family/friends.  Will send packet of information on caregiving resources, encouraged patient to call as needed for help/resources.    Edwyna Shell, LCSW Clinical Social Worker Phone:  [phone]

## 2018-04-09 NOTE — Progress Notes (Addendum)
Patient scheduled for port and diag lap on 04/13/2018 for Stave IVA gastric cancer  . Patient received 3 units of blood on 04/01/2018.  Completed history via phone and saw her in [place] on 04/08/2018 and gave her hibiclens soap, and Ensure preop drink with written instructions.  Patient also signed consent.   03/31/2018- CBC- HGB- 8.0.  03/28/2018-CMP-epic- [place]-3.2 04/08/2018- HGB- 9.1 prior to Iron Transfusion at [facility].   Patient has Echo scheduled fro 04/09/2018 at 215 pm.   Patient to start chemo on 04/15/2018.

## 2018-04-10 NOTE — Anesthesia Preprocedure Evaluation (Addendum)
Anesthesia Evaluation  Patient identified by MRN, date of birth, ID band Patient awake    Reviewed: Allergy & Precautions, NPO status , Patient's Chart, lab work & pertinent test results  History of Anesthesia Complications Negative for: history of anesthetic complications  Airway Mallampati: II  TM Distance: >3 FB Neck ROM: Full    Dental  (+) Teeth Intact, Dental Advisory Given   Pulmonary asthma , former smoker,    Pulmonary exam normal breath sounds clear to auscultation       Cardiovascular hypertension, Normal cardiovascular exam Rhythm:Regular Rate:Normal  TTE 04/09/18: mild LVH, EF 06-30%, grade 1 diastolic dysfunction, mild TR   Neuro/Psych negative neurological ROS     GI/Hepatic Neg liver ROS, PUD, Gastric ca   Endo/Other  Hypothyroidism   Renal/GU negative Renal ROS     Musculoskeletal negative musculoskeletal ROS (+)   Abdominal   Peds  Hematology  (+) anemia , Hgb 6.3 today, will get 1u pRBCs prior to OR   Anesthesia Other Findings Day of surgery medications reviewed with the patient.  Reproductive/Obstetrics                           Anesthesia Physical Anesthesia Plan  ASA: III  Anesthesia Plan: General   Post-op Pain Management:    Induction: Intravenous  PONV Risk Score and Plan: 3 and Treatment may vary due to age or medical condition, Ondansetron, Dexamethasone and Midazolam  Airway Management Planned: Oral ETT  Additional Equipment:   Intra-op Plan:   Post-operative Plan: Extubation in OR  Informed Consent: I have reviewed the patients History and Physical, chart, labs and discussed the procedure including the risks, benefits and alternatives for the proposed anesthesia with the patient or authorized representative who has indicated his/her understanding and acceptance.     Dental advisory given  Plan Discussed with: CRNA  Anesthesia Plan Comments:        Anesthesia Quick Evaluation

## 2018-04-13 ENCOUNTER — Telehealth: Payer: Self-pay | Admitting: Hematology

## 2018-04-13 ENCOUNTER — Ambulatory Visit (HOSPITAL_COMMUNITY)
Admission: RE | Admit: 2018-04-13 | Discharge: 2018-04-13 | Disposition: A | Discharge status: Home / Self Care | Payer: 59 | Attending: General Surgery | Admitting: General Surgery

## 2018-04-13 ENCOUNTER — Ambulatory Visit (HOSPITAL_COMMUNITY): Payer: 59 | Admitting: Physician Assistant

## 2018-04-13 ENCOUNTER — Telehealth: Payer: Self-pay | Admitting: Nutrition

## 2018-04-13 ENCOUNTER — Encounter (HOSPITAL_COMMUNITY)
Admission: RE | Disposition: A | Discharge status: Home / Self Care | Payer: Self-pay | Source: Home / Self Care | Attending: General Surgery

## 2018-04-13 ENCOUNTER — Ambulatory Visit (HOSPITAL_COMMUNITY): Payer: 59

## 2018-04-13 ENCOUNTER — Encounter (HOSPITAL_COMMUNITY): Payer: Self-pay | Admitting: Anesthesiology

## 2018-04-13 ENCOUNTER — Other Ambulatory Visit: Payer: Self-pay | Admitting: Hematology

## 2018-04-13 DIAGNOSIS — Z87891 Personal history of nicotine dependence: Secondary | ICD-10-CM | POA: Insufficient documentation

## 2018-04-13 DIAGNOSIS — K279 Peptic ulcer, site unspecified, unspecified as acute or chronic, without hemorrhage or perforation: Secondary | ICD-10-CM | POA: Diagnosis not present

## 2018-04-13 DIAGNOSIS — C169 Malignant neoplasm of stomach, unspecified: Secondary | ICD-10-CM

## 2018-04-13 DIAGNOSIS — J45909 Unspecified asthma, uncomplicated: Secondary | ICD-10-CM | POA: Insufficient documentation

## 2018-04-13 DIAGNOSIS — Z809 Family history of malignant neoplasm, unspecified: Secondary | ICD-10-CM | POA: Insufficient documentation

## 2018-04-13 DIAGNOSIS — E039 Hypothyroidism, unspecified: Secondary | ICD-10-CM | POA: Diagnosis not present

## 2018-04-13 DIAGNOSIS — R011 Cardiac murmur, unspecified: Secondary | ICD-10-CM | POA: Diagnosis not present

## 2018-04-13 DIAGNOSIS — I071 Rheumatic tricuspid insufficiency: Secondary | ICD-10-CM | POA: Insufficient documentation

## 2018-04-13 DIAGNOSIS — Z90710 Acquired absence of both cervix and uterus: Secondary | ICD-10-CM | POA: Diagnosis not present

## 2018-04-13 DIAGNOSIS — K219 Gastro-esophageal reflux disease without esophagitis: Secondary | ICD-10-CM | POA: Insufficient documentation

## 2018-04-13 DIAGNOSIS — I10 Essential (primary) hypertension: Secondary | ICD-10-CM | POA: Insufficient documentation

## 2018-04-13 DIAGNOSIS — Z853 Personal history of malignant neoplasm of breast: Secondary | ICD-10-CM | POA: Diagnosis not present

## 2018-04-13 DIAGNOSIS — D500 Iron deficiency anemia secondary to blood loss (chronic): Secondary | ICD-10-CM

## 2018-04-13 DIAGNOSIS — Z9221 Personal history of antineoplastic chemotherapy: Secondary | ICD-10-CM | POA: Insufficient documentation

## 2018-04-13 DIAGNOSIS — Z79899 Other long term (current) drug therapy: Secondary | ICD-10-CM | POA: Insufficient documentation

## 2018-04-13 DIAGNOSIS — D649 Anemia, unspecified: Secondary | ICD-10-CM | POA: Insufficient documentation

## 2018-04-13 DIAGNOSIS — Z95828 Presence of other vascular implants and grafts: Secondary | ICD-10-CM

## 2018-04-13 HISTORY — PX: PORTACATH PLACEMENT: SHX2246

## 2018-04-13 HISTORY — PX: LAPAROSCOPY: SHX197

## 2018-04-13 HISTORY — DX: Malignant (primary) neoplasm, unspecified: C80.1

## 2018-04-13 LAB — HEMOGLOBIN AND HEMATOCRIT, BLOOD
HCT: 20.8 % — ABNORMAL LOW (ref 36.0–46.0)
Hemoglobin: 6.3 g/dL — CL (ref 12.0–15.0)

## 2018-04-13 LAB — PREPARE RBC (CROSSMATCH)

## 2018-04-13 SURGERY — INSERTION, TUNNELED CENTRAL VENOUS DEVICE, WITH PORT
Anesthesia: General | Site: Neck

## 2018-04-13 MED ORDER — LIDOCAINE 2% (20 MG/ML) 5 ML SYRINGE
INTRAMUSCULAR | Status: DC | PRN
  Administered 2018-04-13: 1.5 mg/kg/h via INTRAVENOUS

## 2018-04-13 MED ORDER — SUGAMMADEX SODIUM 200 MG/2ML IV SOLN
INTRAVENOUS | Status: AC
  Filled 2018-04-13: qty 2

## 2018-04-13 MED ORDER — LACTATED RINGERS IV SOLN
INTRAVENOUS | Status: AC | PRN
  Administered 2018-04-13: 1000 mL

## 2018-04-13 MED ORDER — CHLORHEXIDINE GLUCONATE CLOTH 2 % EX PADS: 6.0000 | MEDICATED_PAD | Freq: Once | CUTANEOUS | Status: DC

## 2018-04-13 MED ORDER — LIDOCAINE 2% (20 MG/ML) 5 ML SYRINGE
INTRAMUSCULAR | Status: AC
  Filled 2018-04-13: qty 5

## 2018-04-13 MED ORDER — DEXAMETHASONE SODIUM PHOSPHATE 10 MG/ML IJ SOLN
INTRAMUSCULAR | Status: AC
  Filled 2018-04-13: qty 1

## 2018-04-13 MED ORDER — HEPARIN SOD (PORK) LOCK FLUSH 100 UNIT/ML IV SOLN
INTRAVENOUS | Status: AC
  Filled 2018-04-13: qty 5

## 2018-04-13 MED ORDER — BUPIVACAINE-EPINEPHRINE (PF) 0.25% -1:200000 IJ SOLN
INTRAMUSCULAR | Status: AC
  Filled 2018-04-13: qty 30

## 2018-04-13 MED ORDER — MIDAZOLAM HCL 2 MG/2ML IJ SOLN
INTRAMUSCULAR | Status: AC
  Filled 2018-04-13: qty 2

## 2018-04-13 MED ORDER — ACETAMINOPHEN 10 MG/ML IV SOLN: 1000.0000 mg | Freq: Once | INTRAVENOUS | Status: DC | PRN

## 2018-04-13 MED ORDER — ROCURONIUM BROMIDE 10 MG/ML (PF) SYRINGE
PREFILLED_SYRINGE | INTRAVENOUS | Status: DC | PRN
  Administered 2018-04-13: 50 mg via INTRAVENOUS

## 2018-04-13 MED ORDER — OXYCODONE HCL 5 MG/5ML PO SOLN: 5.0000 mg | Freq: Once | ORAL | Status: AC | PRN

## 2018-04-13 MED ORDER — SUGAMMADEX SODIUM 200 MG/2ML IV SOLN
INTRAVENOUS | Status: DC | PRN
  Administered 2018-04-13: 200 mg via INTRAVENOUS

## 2018-04-13 MED ORDER — BUPIVACAINE-EPINEPHRINE 0.25% -1:200000 IJ SOLN
INTRAMUSCULAR | Status: DC | PRN
  Administered 2018-04-13: 9 mL

## 2018-04-13 MED ORDER — EPHEDRINE 5 MG/ML INJ
INTRAVENOUS | Status: AC
  Filled 2018-04-13: qty 20

## 2018-04-13 MED ORDER — FENTANYL CITRATE (PF) 100 MCG/2ML IJ SOLN: 25.0000 ug | INTRAMUSCULAR | Status: DC | PRN

## 2018-04-13 MED ORDER — OXYCODONE HCL 5 MG PO TABS: 5.0000 mg | ORAL_TABLET | Freq: Four times a day (QID) | ORAL | 0 refills | Status: DC | PRN

## 2018-04-13 MED ORDER — LACTATED RINGERS IV SOLN
INTRAVENOUS | Status: DC
  Administered 2018-04-13: 10:00:00 via INTRAVENOUS

## 2018-04-13 MED ORDER — MIDAZOLAM HCL 5 MG/5ML IJ SOLN
INTRAMUSCULAR | Status: DC | PRN
  Administered 2018-04-13: 2 mg via INTRAVENOUS

## 2018-04-13 MED ORDER — OXYCODONE HCL 5 MG PO TABS
ORAL_TABLET | ORAL | Status: AC
  Filled 2018-04-13: qty 1

## 2018-04-13 MED ORDER — PROPOFOL 10 MG/ML IV BOLUS
INTRAVENOUS | Status: DC | PRN
  Administered 2018-04-13: 150 mg via INTRAVENOUS

## 2018-04-13 MED ORDER — GABAPENTIN 300 MG PO CAPS
300.0000 mg | ORAL_CAPSULE | ORAL | Status: AC
  Administered 2018-04-13: 300 mg via ORAL
  Filled 2018-04-13: qty 1

## 2018-04-13 MED ORDER — PROPOFOL 10 MG/ML IV BOLUS
INTRAVENOUS | Status: AC
  Filled 2018-04-13: qty 20

## 2018-04-13 MED ORDER — PHENYLEPHRINE 40 MCG/ML (10ML) SYRINGE FOR IV PUSH (FOR BLOOD PRESSURE SUPPORT)
PREFILLED_SYRINGE | INTRAVENOUS | Status: AC
  Filled 2018-04-13: qty 10

## 2018-04-13 MED ORDER — SODIUM CHLORIDE 0.9 % IV SOLN
Freq: Once | INTRAVENOUS | Status: AC
  Administered 2018-04-13: 500 mL
  Filled 2018-04-13: qty 1.2

## 2018-04-13 MED ORDER — ONDANSETRON HCL 4 MG/2ML IJ SOLN
INTRAMUSCULAR | Status: DC | PRN
  Administered 2018-04-13: 4 mg via INTRAVENOUS

## 2018-04-13 MED ORDER — HEPARIN SOD (PORK) LOCK FLUSH 100 UNIT/ML IV SOLN
INTRAVENOUS | Status: DC | PRN
  Administered 2018-04-13: 500 [IU]

## 2018-04-13 MED ORDER — FENTANYL CITRATE (PF) 100 MCG/2ML IJ SOLN
INTRAMUSCULAR | Status: AC
  Filled 2018-04-13: qty 2

## 2018-04-13 MED ORDER — ROCURONIUM BROMIDE 100 MG/10ML IV SOLN
INTRAVENOUS | Status: AC
  Filled 2018-04-13: qty 2

## 2018-04-13 MED ORDER — CEFAZOLIN SODIUM-DEXTROSE 2-4 GM/100ML-% IV SOLN
2.0000 g | INTRAVENOUS | Status: AC
  Administered 2018-04-13: 2 g via INTRAVENOUS
  Filled 2018-04-13: qty 100

## 2018-04-13 MED ORDER — SODIUM CHLORIDE 0.9% IV SOLUTION: Freq: Once | INTRAVENOUS | Status: DC

## 2018-04-13 MED ORDER — PROMETHAZINE HCL 25 MG/ML IJ SOLN: 6.2500 mg | INTRAMUSCULAR | Status: DC | PRN

## 2018-04-13 MED ORDER — EPHEDRINE SULFATE-NACL 50-0.9 MG/10ML-% IV SOSY
PREFILLED_SYRINGE | INTRAVENOUS | Status: DC | PRN
  Administered 2018-04-13 (×3): 5 mg via INTRAVENOUS

## 2018-04-13 MED ORDER — ACETAMINOPHEN 500 MG PO TABS
1000.0000 mg | ORAL_TABLET | ORAL | Status: AC
  Administered 2018-04-13: 1000 mg via ORAL
  Filled 2018-04-13: qty 2

## 2018-04-13 MED ORDER — FENTANYL CITRATE (PF) 250 MCG/5ML IJ SOLN
INTRAMUSCULAR | Status: AC
  Filled 2018-04-13: qty 5

## 2018-04-13 MED ORDER — LIDOCAINE HCL (PF) 1 % IJ SOLN
INTRAMUSCULAR | Status: DC | PRN
  Administered 2018-04-13: 9 mL

## 2018-04-13 MED ORDER — LIDOCAINE 2% (20 MG/ML) 5 ML SYRINGE
INTRAMUSCULAR | Status: DC | PRN
  Administered 2018-04-13: 100 mg via INTRAVENOUS

## 2018-04-13 MED ORDER — FENTANYL CITRATE (PF) 100 MCG/2ML IJ SOLN
INTRAMUSCULAR | Status: DC | PRN
  Administered 2018-04-13: 100 ug via INTRAVENOUS

## 2018-04-13 MED ORDER — LIDOCAINE-EPINEPHRINE (PF) 1 %-1:200000 IJ SOLN
INTRAMUSCULAR | Status: AC
  Filled 2018-04-13: qty 30

## 2018-04-13 MED ORDER — DEXAMETHASONE SODIUM PHOSPHATE 10 MG/ML IJ SOLN
INTRAMUSCULAR | Status: DC | PRN
  Administered 2018-04-13: 4 mg via INTRAVENOUS

## 2018-04-13 MED ORDER — ONDANSETRON HCL 4 MG/2ML IJ SOLN
INTRAMUSCULAR | Status: AC
  Filled 2018-04-13: qty 2

## 2018-04-13 MED ORDER — LIDOCAINE HCL (PF) 1 % IJ SOLN
INTRAMUSCULAR | Status: AC
  Filled 2018-04-13: qty 30

## 2018-04-13 MED ORDER — PHENYLEPHRINE HCL 10 MG/ML IJ SOLN
INTRAMUSCULAR | Status: AC
  Filled 2018-04-13: qty 1

## 2018-04-13 MED ORDER — LACTATED RINGERS IV SOLN
INTRAVENOUS | Status: DC
  Administered 2018-04-13: 09:00:00 via INTRAVENOUS

## 2018-04-13 MED ORDER — OXYCODONE HCL 5 MG PO TABS
5.0000 mg | ORAL_TABLET | Freq: Once | ORAL | Status: AC | PRN
  Administered 2018-04-13: 5 mg via ORAL

## 2018-04-13 SURGICAL SUPPLY — 55 items
BAG DECANTER FOR FLEXI CONT (MISCELLANEOUS) ×4 IMPLANT
BENZOIN TINCTURE PRP APPL 2/3 (GAUZE/BANDAGES/DRESSINGS) IMPLANT
BLADE HEX COATED 2.75 (ELECTRODE) ×4 IMPLANT
BLADE SURG 15 STRL LF DISP TIS (BLADE) ×2 IMPLANT
BLADE SURG 15 STRL SS (BLADE) ×2
BLADE SURG SZ11 CARB STEEL (BLADE) ×4 IMPLANT
CHLORAPREP W/TINT 26ML (MISCELLANEOUS) ×4 IMPLANT
CLOSURE WOUND 1/2 X4 (GAUZE/BANDAGES/DRESSINGS) ×1
COVER SURGICAL LIGHT HANDLE (MISCELLANEOUS) ×4 IMPLANT
COVER WAND RF STERILE (DRAPES) ×4 IMPLANT
DECANTER SPIKE VIAL GLASS SM (MISCELLANEOUS) ×4 IMPLANT
DERMABOND ADVANCED (GAUZE/BANDAGES/DRESSINGS) ×2
DERMABOND ADVANCED .7 DNX12 (GAUZE/BANDAGES/DRESSINGS) ×2 IMPLANT
DRAPE C-ARM 42X120 X-RAY (DRAPES) ×4 IMPLANT
DRAPE LAPAROTOMY TRNSV 102X78 (DRAPE) ×4 IMPLANT
DRAPE UTILITY XL STRL (DRAPES) ×4 IMPLANT
DRSG TEGADERM 4X4.75 (GAUZE/BANDAGES/DRESSINGS) ×4 IMPLANT
ELECT PENCIL ROCKER SW 15FT (MISCELLANEOUS) ×4 IMPLANT
ELECT REM PT RETURN 15FT ADLT (MISCELLANEOUS) ×4 IMPLANT
GAUZE 4X4 16PLY RFD (DISPOSABLE) ×4 IMPLANT
GAUZE SPONGE 2X2 8PLY STRL LF (GAUZE/BANDAGES/DRESSINGS) ×2 IMPLANT
GLOVE BIO SURGEON STRL SZ 6 (GLOVE) ×4 IMPLANT
GLOVE INDICATOR 6.5 STRL GRN (GLOVE) ×8 IMPLANT
GOWN SRG 2XL LVL 4 BRTHBL STRL (GOWNS) ×2 IMPLANT
GOWN STRL NON-REIN 2XL LVL4 (GOWNS) ×2
GOWN STRL REUS W/ TWL XL LVL3 (GOWN DISPOSABLE) ×6 IMPLANT
GOWN STRL REUS W/TWL 2XL LVL3 (GOWN DISPOSABLE) ×8 IMPLANT
GOWN STRL REUS W/TWL XL LVL3 (GOWN DISPOSABLE) ×10 IMPLANT
IRRIG SUCT STRYKERFLOW 2 WTIP (MISCELLANEOUS)
IRRIGATION SUCT STRKRFLW 2 WTP (MISCELLANEOUS) IMPLANT
KIT BASIN OR (CUSTOM PROCEDURE TRAY) ×4 IMPLANT
KIT PORT POWER 8FR ISP CVUE (Port) ×4 IMPLANT
NEEDLE HYPO 22GX1.5 SAFETY (NEEDLE) ×4 IMPLANT
PACK BASIC VI WITH GOWN DISP (CUSTOM PROCEDURE TRAY) ×4 IMPLANT
SET TUBE SMOKE EVAC HIGH FLOW (TUBING) ×4 IMPLANT
SHEARS HARMONIC ACE PLUS 36CM (ENDOMECHANICALS) IMPLANT
SOLUTION ANTI FOG 6CC (MISCELLANEOUS) ×4 IMPLANT
SPONGE GAUZE 2X2 STER 10/PKG (GAUZE/BANDAGES/DRESSINGS) ×2
STRIP CLOSURE SKIN 1/2X4 (GAUZE/BANDAGES/DRESSINGS) ×3 IMPLANT
SUT MNCRL AB 4-0 PS2 18 (SUTURE) ×4 IMPLANT
SUT PROLENE 2 0 SH DA (SUTURE) ×8 IMPLANT
SUT VIC AB 3-0 SH 27 (SUTURE) ×2
SUT VIC AB 3-0 SH 27X BRD (SUTURE) ×2 IMPLANT
SUT VIC AB 4-0 PS2 27 (SUTURE) IMPLANT
SYR 10ML LL (SYRINGE) ×4 IMPLANT
SYR CONTROL 10ML LL (SYRINGE) ×4 IMPLANT
TOWEL OR 17X26 10 PK STRL BLUE (TOWEL DISPOSABLE) ×12 IMPLANT
TOWEL OR NON WOVEN STRL DISP B (DISPOSABLE) ×4 IMPLANT
TRAY FOLEY MTR SLVR 16FR STAT (SET/KITS/TRAYS/PACK) IMPLANT
TRAY LAPAROSCOPIC (CUSTOM PROCEDURE TRAY) ×4 IMPLANT
TROCAR XCEL BLUNT TIP 100MML (ENDOMECHANICALS) ×4 IMPLANT
TROCAR XCEL NON-BLD 11X100MML (ENDOMECHANICALS) IMPLANT
TROCAR XCEL UNIV SLVE 11M 100M (ENDOMECHANICALS) IMPLANT
WATER STERILE IRR 1000ML POUR (IV SOLUTION) IMPLANT
YANKAUER SUCT BULB TIP 10FT TU (MISCELLANEOUS) IMPLANT

## 2018-04-13 NOTE — Anesthesia Procedure Notes (Addendum)
Procedure Name: Intubation Date/Time: 04/13/2018 12:21 PM Performed by: Eben Burow, CRNA Pre-anesthesia Checklist: Patient identified, Emergency Drugs available, Suction available, Patient being monitored and Timeout performed Patient Re-evaluated:Patient Re-evaluated prior to induction Oxygen Delivery Method: Circle system utilized Preoxygenation: Pre-oxygenation with 100% oxygen Induction Type: IV induction Ventilation: Mask ventilation without difficulty Laryngoscope Size: Mac Grade View: Grade I Tube type: Oral Tube size: 7.0 mm Number of attempts: 1 Airway Equipment and Method: Stylet Placement Confirmation: ETT inserted through vocal cords under direct vision,  positive ETCO2 and breath sounds checked- equal and bilateral Secured at: 21 cm Tube secured with: Tape Dental Injury: Teeth and Oropharynx as per pre-operative assessment

## 2018-04-13 NOTE — Progress Notes (Signed)
CRITICAL VALUE ALERT  Critical Value:  6.3 hgb  Date & Time Notied: 0923 04/13/2018  Provider Notified: Dr. Daiva Huge was notified  Orders Received/Actions taken: T&C for 1 unit blood

## 2018-04-13 NOTE — Progress Notes (Signed)
[place]   [phone]) [phone] [phone]) [phone]   Clinic Follow up Note   Patient Care Team: Emeterio Reeve, DO as PCP - General (Osteopathic Medicine)  Date of Service:  04/15/2018  CHIEF COMPLAINT: f/u of Newly Diagnosed Gastric Cancer  SUMMARY OF ONCOLOGIC HISTORY: Oncology History   Cancer Staging Gastric cancer [facility]) Staging form: Stomach, AJCC 8th Edition - Clinical stage from 03/29/2018: Stage IVA (cT4b, cN1, cM0) - Signed by [name], MD on 04/03/2018       Gastric cancer ([place])   03/19/2018 Procedure    Upper Endoscopy by Dr. Michail Sermon 03/29/18  IMPRESSION - Normal esophagus. - Z-line regular, 38 cm from the incisors. - Non-obstructing oozing gastric ulcer with pigmented material. Biopsied. - Normal examined duodenum. - Acute gastritis.    03/29/2018 Procedure    Colonoscopy by Dr. Michail Sermon 03/29/18 IMPRESSION - Preparation of the colon was fair. - Internal hemorrhoids. - The examined portion of the ileum was normal. - No specimens collected.    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 - SEE COMMENT. Microscopic Comment A Warthin Starry stain is negative for the presence of Helicobacter pylori organisms. Dr Vic Ripper has reviewed the case and concurs with this interpretation. Dr Michail Sermon was paged on 03/31/2018. (JBK:ecj 03/31/2018)    03/29/2018 Cancer Staging    Staging form: Stomach, AJCC 8th Edition - Clinical stage from 03/29/2018: Stage IVA (cT4b, cN1, cM0) - Signed by [name], MD on 04/03/2018    04/02/2018 Initial Diagnosis    Gastric cancer ([place])    04/02/2018 Imaging    CT CAP W Contrast 04/02/18 IMPRESSION: 1. Focal thickening lateral wall proximal stomach with protrusion of low-attenuation soft tissue beyond the expected confines of the gastric wall into the splenic hilum. Imaging features highly concerning for transmural tumor extension into the  splenic hilum. Several small nodules in this region are likely lymph nodes, concerning for metastatic disease. 2. Scattered bilateral pulmonary nodules measuring up to 5 mm. Nonspecific, but close attention on follow-up recommended as metastatic disease not excluded. 3. Small lymph nodes in the gastrohepatic ligament, but there is no gastrohepatic or hepato duodenal ligament lymphadenopathy. No evidence for liver metastases. 4.  Aortic Atherosclerois (ICD10-170.0    04/15/2018 -  Chemotherapy    FLOT4 every 2 weeks starting 04/15/18      CURRENT THERAPY:  Neoadjuvant FLOT4 every 2 weeks starting 04/15/18  INTERVAL HISTORY:  Mariah Kemp is here for a follow up and cycle 1 FLOTreatment. She presents to the clinic today noting her procedure went well with Dr. Barry Dienes. She notes red tinted stool and black stool. She notes she has not had a bowel movement in 2 days. I reviewed her medication list with her.   She notes her Stepmother passed 2 day ago and her funeral is next week. She wonders can she travel to the funeral.     REVIEW OF SYSTEMS:   Constitutional: Denies fevers, chills or abnormal weight loss Eyes: Denies blurriness of vision Ears, nose, mouth, throat, and face: Denies mucositis or sore throat Respiratory: Denies cough, dyspnea or wheezes Cardiovascular: Denies palpitation, chest discomfort or lower extremity swelling Gastrointestinal:  Denies nausea, heartburn or change in bowel habits (+) black and red-tninted stool Skin: Denies abnormal skin rashes Lymphatics: Denies new lymphadenopathy or easy bruising Neurological:Denies numbness, tingling or new weaknesses Behavioral/Psych: Mood is stable, no new changes  All other systems were reviewed with the patient and  are negative.  MEDICAL HISTORY:  Past Medical History:  Diagnosis Date  . Cancer ([place])   . Hypertension   . Thyroid disease     SURGICAL HISTORY: Past Surgical History:  Procedure Laterality Date    . ABDOMINAL HYSTERECTOMY  1996  . BILATERAL SALPINGOOPHORECTOMY  2007  . BIOPSY  03/29/2018   Procedure: BIOPSY;  Surgeon: Wilford Corner, MD;  Location: [facility];  Service: Endoscopy;;  . CARPAL TUNNEL RELEASE    . COLONOSCOPY N/A 03/29/2018   Procedure: COLONOSCOPY;  Surgeon: Wilford Corner, MD;  Location: [facility]COPY;  Service: Endoscopy;  Laterality: N/A;  . ESOPHAGOGASTRODUODENOSCOPY N/A 03/29/2018   Procedure: ESOPHAGOGASTRODUODENOSCOPY (EGD);  Surgeon: Wilford Corner, MD;  Location: [place];  Service: Endoscopy;  Laterality: N/A;  . LAPAROSCOPY N/A 04/13/2018   Procedure: LAPAROSCOPY DIAGNOSTIC;  Surgeon: [name], MD;  Location: [facility];  Service: General;  Laterality: N/A;  . MASTECTOMY  2007  . PORTACATH PLACEMENT Left 04/13/2018   Procedure: INSERTION PORT-A-CATH ERAS PATHWAY;  Surgeon: [name], MD;  Location: [facility];  Service: General;  Laterality: Left;  subclavian    I have reviewed the social history and family history with the patient and they are unchanged from previous note.  ALLERGIES:  has No Known Allergies.  MEDICATIONS:  Current Outpatient Medications  Medication Sig Dispense Refill  . acyclovir (ZOVIRAX) 400 MG tablet Take 1 tablet (400 mg total) by mouth 3 (three) times daily. For five days. As needed for cold sore. (Patient taking differently: Take 400 mg by mouth 3 (three) times daily as needed (cold sores.). ) 15 tablet 0  . albuterol (PROVENTIL HFA;VENTOLIN HFA) 108 (90 Base) MCG/ACT inhaler Inhale 1-2 puffs into the lungs every 4 (four) hours as needed for wheezing or shortness of breath. 2 Inhaler 3  . budesonide-formoterol (SYMBICORT) 160-4.5 MCG/ACT inhaler Inhale 2 puffs into the lungs 2 (two) times daily. (Patient taking differently: Inhale 2 puffs into the lungs 2 (two) times daily as needed (respiratory issues.). ) 2 Inhaler 3  . dexamethasone (DECADRON) 4 MG tablet Take 1 tablet (4 mg total) by mouth 2 (two) times daily with a  meal. 30 tablet 2  . ferrous sulfate 325 (65 FE) MG tablet Take 1 tablet (325 mg total) by mouth 2 (two) times daily with a meal. 90 tablet 0  . hydrochlorothiazide (HYDRODIURIL) 25 MG tablet TAKE 1 TABLET BY MOUTH ONCE DAILY (Patient taking differently: Take 25 mg by mouth daily. ) 90 tablet 1  . levothyroxine (SYNTHROID, LEVOTHROID) 150 MCG tablet Take 1 tablet (150 mcg total) by mouth daily before breakfast. 90 tablet 3  . ondansetron (ZOFRAN) 8 MG tablet Take 1 tablet (8 mg total) by mouth every 8 (eight) hours as needed for nausea or vomiting. 30 tablet 1  . oxyCODONE (OXY IR/ROXICODONE) 5 MG immediate release tablet Take 1 tablet (5 mg total) by mouth every 6 (six) hours as needed for severe pain. 10 tablet 0  . pantoprazole (PROTONIX) 40 MG tablet Take 1 tablet (40 mg total) by mouth 2 (two) times daily before a meal for 30 days. 60 tablet 0  . Prenatal Vit-Fe Fumarate-FA (PRENATAL MULTIVITAMIN) TABS tablet Take 1 tablet by mouth daily at 12 noon.    . prochlorperazine (COMPAZINE) 10 MG tablet Take 1 tablet (10 mg total) by mouth every 6 (six) hours as needed for nausea or vomiting. 30 tablet 2  . vitamin B-12 1000 MCG tablet Take 1 tablet (1,000 mcg total) by mouth daily. 30 tablet  0   No current facility-administered medications for this visit.    Facility-Administered Medications Ordered in Other Visits  Medication Dose Route Frequency Provider Last Rate Last Dose  . ferumoxytol (FERAHEME) 510 mg in sodium chloride 0.9 % 100 mL IVPB  510 mg Intravenous Once [name], MD        PHYSICAL EXAMINATION: ECOG PERFORMANCE STATUS: 1 - Symptomatic but completely ambulatory  There were no vitals filed for this visit. There were no vitals filed for this visit.  GENERAL:alert, no distress and comfortable SKIN: skin color, texture, turgor are normal, no rashes or significant lesions EYES: normal, Conjunctiva are pink and non-injected, sclera clear OROPHARYNX:no exudate, no erythema and lips,  buccal mucosa, and tongue normal  NECK: supple, thyroid normal size, non-tender, without nodularity LYMPH:  no palpable lymphadenopathy in the cervical, axillary or inguinal LUNGS: clear to auscultation and percussion with normal breathing effort HEART: regular rate & rhythm and no murmurs and no lower extremity edema ABDOMEN:abdomen soft, non-tender and normal bowel sounds Musculoskeletal:no cyanosis of digits and no clubbing  NEURO: alert & oriented x 3 with fluent speech, no focal motor/sensory deficits  LABORATORY DATA:  I have reviewed the data as listed CBC Latest Ref Rng & Units 04/15/2018 04/13/2018 04/08/2018  WBC 4.0 - 10.5 K/uL 5.5 - 5.7  Hemoglobin 12.0 - 15.0 g/dL 8.6(L) 6.3(LL) 9.1(L)  Hematocrit 36.0 - 46.0 % 27.5(L) 20.8(L) 28.5(L)  Platelets 150 - 400 K/uL 193 - 226     CMP Latest Ref Rng & Units 03/28/2018 03/27/2018 10/08/2017  Glucose 70 - 99 mg/dL 107(H) 87 100(H)  BUN 6 - 20 mg/dL 18 17 24   Creatinine 0.44 - 1.00 mg/dL 0.78 0.74 0.70  Sodium 135 - 145 mmol/L 137 137 137  Potassium 3.5 - 5.1 mmol/L 3.2(L) 3.6 3.8  Chloride 98 - 111 mmol/L 102 101 102  CO2 22 - 32 mmol/L 24 26 29   Calcium 8.9 - 10.3 mg/dL 8.9 9.0 9.5  Total Protein 6.5 - 8.1 g/dL 7.1 7.2 7.1  Total Bilirubin 0.3 - 1.2 mg/dL 0.3 0.2 0.3  Alkaline Phos 38 - 126 U/L 62 - -  AST 15 - 41 U/L 24 19 18   ALT 0 - 44 U/L 20 14 15       RADIOGRAPHIC STUDIES: I have personally reviewed the radiological images as listed and agreed with the findings in the report. Dg Chest Port 1 View  Result Date: 04/13/2018 CLINICAL DATA:  58 y/o  F; port placement postop. EXAM: PORTABLE CHEST 1 VIEW COMPARISON:  03/19/2018 chest radiograph. 04/02/2018 chest CT. FINDINGS: Interval placement of a port catheter with tip projecting over the lower SVC. Aortic calcific atherosclerosis. Normal cardiac silhouette. Multiple surgical clips project over the right chest wall. Left lung base platelike atelectasis. No consolidation,  effusion, or pneumothorax. No acute osseous abnormality is evident. IMPRESSION: Interval placement of a port catheter with tip projecting over the lower SVC. No pneumothorax. Electronically Signed   By: Kristine Garbe M.D.   On: 04/13/2018 14:22   Dg C-arm 1-60 Min-no Report  Result Date: 04/13/2018 Fluoroscopy was utilized by the requesting physician.  No radiographic interpretation.     ASSESSMENT & PLAN:  Shahrzad Koble is a 58 y.o. female with   1. 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  -She has seen surgeon Dr. Barry Dienes, and underwent exploratory laparoscope which was negative for peritoneal metastasis. -Given her locally advanced disease I recommend neo-adjuvant chemo FLOT4 every 2 weeks for 4 months  -Plan to repeat CT scan after 2 months of treatment.  -She will proceed with cycle 1 FLOT4 today. She has participated chemo class, I again reviewed side effects with patient. She agrees to proceed  -I will prescribe her dexa to take 2 tabs BID the day before chemo and 1 tab BID the day after chemo to reduce her chance of allergy reaction. Will monitor her blood glucose. I will also call in Zofran and Compazine for her to use as needed after chemo -She may attend a funeral next week if she feels well enough. I discussed with this chemo her blood counts will drop and she is at increased risk of blood clots. I advised her to avoid large crowds and ambulate often when traveling.  -Will follow up in 1 week for toxicity checkup   2. Anemia, iron deficient from chronic GI bleeding. -Secondary to GI blood loss  -Given 3u blood transfusion on 04/01/18, responded moderately well.  -currently on oral iron daily. I recommend prenatal  vitamin -She completed 1 dose IV Iron and will receive 2nd dose today for aggressive correction. -She still has GI bleeding. I discussed with treatment her bleeding should improve -Will monitor closely with labs weekly    3. H/o stage 3 right breast cancer in 2007 -Treated with right mastectomy, reconstruction with 3 LN removed. She underwent chemotherapy for 5 months, likely AC-T. She completed Tamoxifen for at least 5 years.  -Will try to obtain medical records  4. Genetic  -I disucssed her eligibility for genetic testing.  She has personal history of breast and gastric cancer.  She is interested.  I previously referred her.    PLAN: -Proceed with cycle 1 FLOT 4 treatment today and iv feraheme  -Lab and f/u in 1 week    No problem-specific Assessment & Plan notes found for this encounter.   Orders Placed This Encounter  Procedures  . Methylmalonic acid, serum    Standing Status:   Future    Number of Occurrences:   1    Standing Expiration Date:   04/14/2019   All questions were answered. The patient knows to call the clinic with any problems, questions or concerns. No barriers to learning was detected. I spent 20 minutes counseling the patient face to face. The total time spent in the appointment was 25 minutes and more than 50% was on counseling and review of test results     [name], MD 04/15/2018   I, Joslyn Devon, am acting as scribe for [name], MD.   I have reviewed the above documentation for accuracy and completeness, and I agree with the above.

## 2018-04-13 NOTE — Transfer of Care (Signed)
Immediate Anesthesia Transfer of Care Note  Patient: Mariah Kemp  Procedure(s) Performed: INSERTION PORT-A-CATH ERAS PATHWAY (Left Neck) LAPAROSCOPY DIAGNOSTIC (N/A Abdomen)  Patient Location: PACU  Anesthesia Type:General  Level of Consciousness: awake, alert  and oriented  Airway & Oxygen Therapy: Patient Spontanous Breathing and Patient connected to face mask oxygen  Post-op Assessment: Report given to RN and Post -op Vital signs reviewed and stable  Post vital signs: Reviewed and stable  Last Vitals:  Vitals Value Taken Time  BP 120/73 04/13/2018  1:35 PM  Temp    Pulse 82 04/13/2018  1:40 PM  Resp 10 04/13/2018  1:40 PM  SpO2 100 % 04/13/2018  1:40 PM  Vitals shown include unvalidated device data.  Last Pain:  Vitals:   04/13/18 1145  TempSrc: Oral  PainSc:          Complications: No apparent anesthesia complications

## 2018-04-13 NOTE — Anesthesia Postprocedure Evaluation (Signed)
Anesthesia Post Note  Patient: Mariah Kemp  Procedure(s) Performed: INSERTION PORT-A-CATH ERAS PATHWAY (Left Neck) LAPAROSCOPY DIAGNOSTIC (N/A Abdomen)     Patient location during evaluation: PACU Anesthesia Type: General Level of consciousness: awake Pain management: pain level controlled Vital Signs Assessment: post-procedure vital signs reviewed and stable Respiratory status: spontaneous breathing Cardiovascular status: stable Postop Assessment: no apparent nausea or vomiting Anesthetic complications: no    Last Vitals:  Vitals:   04/13/18 1430 04/13/18 1445  BP: 109/77 126/71  Pulse: 74 78  Resp: 15 14  Temp: (!) 36.4 C 36.7 C  SpO2: 95% 98%    Last Pain:  Vitals:   04/13/18 1500  TempSrc:   PainSc: 5    Pain Goal: Patients Stated Pain Goal: 4 (04/13/18 1415)                 Huston Foley

## 2018-04-13 NOTE — Telephone Encounter (Signed)
Patient cancelled nutrition appointment on Tuesday. I will see her on Wednesday during infusion. Left message.

## 2018-04-13 NOTE — H&P (Signed)
Mariah Kemp Documented: 04/07/2018 4:27 PM Location: [facility] Patient #: [id number] DOB: 04-08-1960 Undefined / Language: [name] / Race: White Female   History of Present Illness [name] MD; 04/07/2018 5:40 PM) The patient is a 58 year old female who presents with gastric cancer. Pt is a 58 yo F referred for consultation by Dr. Burr Medico for a diagnosis of gastric cancer 03/2018. She presented wtih pain, extreme fatigue, and tachycardia. She was found to be anemic and was admitted to the hospital where she got blood transfusions, EGD, and colonoscopy. Her hgb was 4.4 upon admit. She was mound to hve a large fungating mass with ulcer. Staging scans were performed which showed potential invasion into the spleen and splenic nodes. She is getting set up for neoadjuvant chemotherapy. She is still quite fatigued. She does not have really abdominal pain, but she does have early satiety. She denies any significant weight loss. She has a history of breast cancer which was addressed in [place]. She did require chemo for that. Her father had liver cancer.   1/14 HT 25.4.  1/11 CMET K 3.2, Alb 3.2.  EGD 1/12 schooler The examined esophagus was normal. Findings: The Z-line was regular and was found 38 cm from the incisors. One non-obstructing oozing cratered malignant-appearing gastric ulcer of significant severity with pigmented material was found on the lesser curvature of the stomach. The lesion was 30 mm in largest dimension. Biopsies were taken with a cold forceps for histology. Estimated blood loss was minimal. The examined duodenum was normal. Segmental mild inflammation characterized by congestion (edema), erosions and erythema was found in the gastric antrum.  pathology EGD 03/29/2018 Diagnosis Stomach, biopsy, Proximal - ADENOCARCINOMA. - GOBLET CELL METAPLASIA. - SEE COMMENT.   CT chest/abd/pelvis 04/02/2017 IMPRESSION: 1. Focal thickening lateral wall  proximal stomach with protrusion of low-attenuation soft tissue beyond the expected confines of the gastric wall into the splenic hilum. Imaging features highly concerning for transmural tumor extension into the splenic hilum. Several small nodules in this region are likely lymph nodes, concerning for metastatic disease. 2. Scattered bilateral pulmonary nodules measuring up to 5 mm. Nonspecific, but close attention on follow-up recommended as metastatic disease not excluded. 3. Small lymph nodes in the gastrohepatic ligament, but there is no gastrohepatic or hepato duodenal ligament lymphadenopathy. No evidence for liver metastases. 4. Aortic Atherosclerois (ICD10-170.0)         Past Surgical History ([name], CMA; 04/09/2018 9:47 AM) Breast Augmentation  Left. Breast Biopsy  Right. Breast Reconstruction  Right. Hysterectomy (not due to cancer) - Partial  Mastectomy  Right.  Diagnostic Studies History ([name], CMA; 04/09/2018 9:47 AM) Colonoscopy  within last year Mammogram  1-3 years ago Pap Smear  >5 years ago  Allergies ([name], [place]; 04/07/2018 4:28 PM) No Known Drug Allergies [04/07/2018]: Allergies Reconciled   Medication History ([name], [place]; 04/07/2018 4:28 PM) hydroCHLOROthiazide (25MG  Tablet, Oral) Active. Levothyroxine Sodium (150MCG Tablet, Oral) Active. Protonix (Oral) Specific strength unknown - Active. Vitamin B12 (Oral) Specific strength unknown - Active. Ferrous Sulfate (Oral) Specific strength unknown - Active. Medications Reconciled  Social History ([name], CMA; 04/09/2018 9:47 AM) Alcohol use  Occasional alcohol use. Caffeine use  Carbonated beverages, Coffee, Tea. No drug use  Tobacco use  Former smoker.  Family History ([name], [place]; 04/09/2018 9:47 AM) Cancer  Father.  Pregnancy / Birth History ([name], [place]; 04/09/2018 9:47 AM) Age at menarche  72 years. Age of menopause   55-50 Contraceptive  History  Intrauterine device, Oral contraceptives. Gravida  3 Length (months) of breastfeeding  3-6 Maternal age  7-20 Para  2  Other Problems ([name], CMA; 04/09/2018 9:47 AM) Asthma  Breast Cancer  Gastric Ulcer  Gastroesophageal Reflux Disease  Heart murmur  High blood pressure  Lump In Breast  Oophorectomy  Bilateral. Thyroid Disease     Review of Systems ([name] CMA; 04/09/2018 9:47 AM) General Present- Chills and Fatigue. Not Present- Appetite Loss, Fever, Night Sweats, Weight Gain and Weight Loss. Skin Not Present- Change in Wart/Mole, Dryness, Hives, Jaundice, New Lesions, Non-Healing Wounds, Rash and Ulcer. HEENT Not Present- Earache, Hearing Loss, Hoarseness, Nose Bleed, Oral Ulcers, Ringing in the Ears, Seasonal Allergies, Sinus Pain, Sore Throat, Visual Disturbances, Wears glasses/contact lenses and Yellow Eyes. Respiratory Not Present- Bloody sputum, Chronic Cough, Difficulty Breathing, Snoring and Wheezing. Breast Not Present- Breast Mass, Breast Pain, Nipple Discharge and Skin Changes. Cardiovascular Present- Rapid Heart Rate and Shortness of Breath. Not Present- Chest Pain, Difficulty Breathing Lying Down, Leg Cramps, Palpitations and Swelling of Extremities. Gastrointestinal Present- Bloating, Excessive gas and Gets full quickly at meals. Not Present- Abdominal Pain, Bloody Stool, Change in Bowel Habits, Chronic diarrhea, Constipation, Difficulty Swallowing, Hemorrhoids, Indigestion, Nausea, Rectal Pain and Vomiting. Female Genitourinary Not Present- Frequency, Nocturia, Painful Urination, Pelvic Pain and Urgency. Musculoskeletal Present- Muscle Weakness. Not Present- Back Pain, Joint Pain, Joint Stiffness, Muscle Pain and Swelling of Extremities. Neurological Present- Headaches and Weakness. Not Present- Decreased Memory, Fainting, Numbness, Seizures, Tingling, Tremor and Trouble walking. Endocrine Present- Cold  Intolerance. Not Present- Excessive Hunger, Hair Changes, Heat Intolerance, Hot flashes and New Diabetes. Hematology Not Present- Blood Thinners, Easy Bruising, Excessive bleeding, Gland problems, HIV and Persistent Infections. All other systems negative  Vitals ([name] A. Brown RMA; 04/07/2018 4:28 PM) 04/07/2018 4:27 PM Weight: 168.2 lb Height: 60in Body Surface Area: 1.73 m Body Mass Index: 32.85 kg/m  Temp.: 98.50F  Pulse: 91 (Regular)  BP: 128/86 (Sitting, Left Arm, Standard)       Physical Exam [name] MD; 04/07/2018 5:41 PM) General Mental Status-Alert. General Appearance-Consistent with stated age. Hydration-Well hydrated. Voice-Normal.  Integumentary Note: a bit pale.   Head and Neck Head-normocephalic, atraumatic with no lesions or palpable masses. Trachea-midline. Thyroid Gland Characteristics - normal size and consistency.  Eye Eyeball - Bilateral-Extraocular movements intact. Sclera/Conjunctiva - Bilateral-No scleral icterus.  Chest and Lung Exam Chest and lung exam reveals -quiet, even and easy respiratory effort with no use of accessory muscles and on auscultation, normal breath sounds, no adventitious sounds and normal vocal resonance. Inspection Chest Wall - Normal. Back - normal.  Cardiovascular Cardiovascular examination reveals -normal heart sounds, regular rate and rhythm with no murmurs and normal pedal pulses bilaterally.  Abdomen Inspection Inspection of the abdomen reveals - No Hernias. Palpation/Percussion Palpation and Percussion of the abdomen reveal - Soft, Non Tender, No Rebound tenderness, No Rigidity (guarding) and No hepatosplenomegaly. Auscultation Auscultation of the abdomen reveals - Bowel sounds normal.  Neurologic Neurologic evaluation reveals -alert and oriented x 3 with no impairment of recent or remote memory. Mental Status-Normal.  Musculoskeletal Global Assessment -Note:  no gross deformities.  Normal Exam - Left-Upper Extremity Strength Normal and Lower Extremity Strength Normal. Normal Exam - Right-Upper Extremity Strength Normal and Lower Extremity Strength Normal.  Lymphatic Head & Neck  General Head & Neck Lymphatics: Bilateral - Description - Normal. Axillary  General Axillary Region: Bilateral - Description - Normal. Tenderness - Non Tender. Femoral & Inguinal  Generalized Femoral &  Inguinal Lymphatics: Bilateral - Description - No Generalized lymphadenopathy.    Assessment & Plan [name] MD; 04/09/2018 2:23 PM) PRIMARY ADENOCARCINOMA OF BODY OF STOMACH (C16.2) Impression: Pt has a new diagnosis of cT3-4N0-1M0 gastric cancer. This is proximal.  Due to the size and potential invasion, will plan neoadjuvant chemo. I will plan port placement and diagnostic laparoscopy. If post tx scans show no metastatic dx, will plan to proceed with subtotal gastrectomy.  Discussed risks of port and laparoscopy with patient. Reviewed what would happen. Discussed post op recovery. Pt wishes to proceed. Will need chemo set up stuff like echo and chemo class. Does not need holter monitor.  Will also send to genetics since she has personal history of breast cancer and now has gastric cancer. Current Plans Pt Education - ccs port insertion education   Signed by [name], MD (04/09/2018 2:24 PM)

## 2018-04-13 NOTE — Interval H&P Note (Signed)
History and Physical Interval Note:  04/13/2018 8:51 AM  Mariah Kemp  has presented today for surgery, with the diagnosis of gastric cancer  The various methods of treatment have been discussed with the patient and family. After consideration of risks, benefits and other options for treatment, the patient has consented to  Procedure(s): INSERTION PORT-A-CATH ERAS PATHWAY (N/A) LAPAROSCOPY DIAGNOSTIC (N/A) as a surgical intervention .  The patient's history has been reviewed, patient examined, no change in status, stable for surgery.  I have reviewed the patient's chart and labs.  Questions were answered to the patient's satisfaction.     [name]

## 2018-04-13 NOTE — Telephone Encounter (Signed)
Called patient per 1/21 schmessage - unable to reach patient - left message for patient with appt date and time

## 2018-04-13 NOTE — Discharge Instructions (Signed)
[facility] °Office Phone Number [phone] ° ° POST OP INSTRUCTIONS ° °Always review your discharge instruction sheet given to you by the facility where your surgery was performed. ° °IF YOU HAVE DISABILITY OR FAMILY LEAVE FORMS, YOU MUST BRING THEM TO THE OFFICE FOR PROCESSING.  DO NOT GIVE THEM TO YOUR DOCTOR. ° °1. A prescription for pain medication may be given to you upon discharge.  Take your pain medication as prescribed, if needed.  If narcotic pain medicine is not needed, then you may take acetaminophen (Tylenol) or ibuprofen (Advil) as needed. °2. Take your usually prescribed medications unless otherwise directed °3. If you need a refill on your pain medication, please contact your pharmacy.  They will contact our office to request authorization.  Prescriptions will not be filled after 5pm or on week-ends. °4. You should eat very light the first 24 hours after surgery, such as soup, crackers, pudding, etc.  Resume your normal diet the day after surgery °5. It is common to experience some constipation if taking pain medication after surgery.  Increasing fluid intake and taking a stool softener will usually help or prevent this problem from occurring.  A mild laxative (Milk of Magnesia or Miralax) should be taken according to package directions if there are no bowel movements after 48 hours. °6. You may shower in 48 hours.  The surgical glue will flake off in 2-3 weeks.   °7. ACTIVITIES:  No strenuous activity or heavy lifting for 1 week.   °a. You may drive when you no longer are taking prescription pain medication, you can comfortably wear a seatbelt, and you can safely maneuver your car and apply brakes. °b. RETURN TO WORK:  __________1 week_______________ °You should see your doctor in the office for a follow-up appointment approximately three-four weeks after your surgery.   ° °WHEN TO CALL YOUR DOCTOR: °1. Fever over 101.0 °2. Nausea and/or vomiting. °3. Extreme swelling or  bruising. °4. Continued bleeding from incision. °5. Increased pain, redness, or drainage from the incision. ° °The clinic staff is available to answer your questions during regular business hours.  Please don’t hesitate to call and ask to speak to one of the nurses for clinical concerns.  If you have a medical emergency, go to the nearest emergency room or call 911.  A surgeon from [facility] is always on call at the hospital. ° °For further questions, please visit [email or web address]  ° °

## 2018-04-13 NOTE — Op Note (Signed)
PREOPERATIVE DIAGNOSIS:  Gastric cancer     POSTOPERATIVE DIAGNOSIS:  Same     PROCEDURE: Left subclavian port placement, Bard Clear Vue Power Port, MRI safe, 8-French, diagnostic laparoscopy     SURGEON:  [name], MD   ASSIST:  Sherlean Foot, RNFA     ANESTHESIA:  General   FINDINGS:  Good venous return, easy flush, and tip of the catheter and   SVC 22 cm. No evidence of carcinomatosis.       SPECIMEN:  none      ESTIMATED BLOOD LOSS:  Minimal.      COMPLICATIONS:  None known.      PROCEDURE:  Pt was identified in the holding area and taken to   the operating room, where patient was placed supine on the operating room   table.  General anesthesia was induced.  Patient's arms were tucked and the upper   chest and neck were prepped and draped in sterile fashion.  Time-out was   performed according to the surgical safety check list.  When all was   correct, we continued.   Local anesthetic was administered over this   area at the angle of the clavicle.  The vein was accessed with 1 pass(es) of the needle. There was good venous return and the wire passed easily with no ectopy.   Fluoroscopy was used to confirm that the wire was in the vena cava.      The patient was placed back level and the area for the pocket was anethetized   with local anesthetic.  A 3-cm transverse incision was made with a #15   blade.  Cautery was used to divide the subcutaneous tissues down to the   pectoralis muscle.  An [employer] retractor was used to elevate the skin   while a pocket was created on top of the pectoralis fascia.  The port   was placed into the pocket to confirm that it was of adequate size.  The   catheter was preattached to the port.  The port was then secured to the   pectoralis fascia with four 2-0 Prolene sutures.  These were clamped and   not tied down yet.    The catheter was tunneled through to the wire exit   site.  The catheter was placed along the wire to determine what  length it should be to be in the SVC.  The catheter was cut at 22 cm.  The tunneler sheath and dilator were passed over the wire and the dilator and wire were removed.  The catheter was advanced through the tunneler sheath and the tunneler sheath was pulled away.  Care was taken to keep the catheter in the tunneler sheath as this occurred. This was advanced and the tunneler sheath was removed.  There was good venous   return and easy flush of the catheter.  The Prolene sutures were tied   down to the pectoral fascia.  The skin was reapproximated using 3-0   Vicryl interrupted deep dermal sutures.    Fluoroscopy was used to re-confirm good position of the catheter.  The skin   was then closed using 4-0 Monocryl in a subcuticular fashion.  The port was flushed with concentrated heparin flush as well.  The wounds were then cleaned, dried, and dressed with Dermabond.    The patient was placed into reverse trendelenburg position and rotated to the right. Local anesthetic was administered at the costal margin.   A 5 mm Optiview trocar  was used to gain entrance into the abdomen under direct visualization.  The abdomen was insufflated to a pressure of 15 mm Hg.  A second 5 mm trocar was placed as well.  The liver surface and peritoneal surfaces were examined.  The pelvis was evaluated.  No evidence of carcinomatosis was seen.  No biopsies were taken.    The patient was awakened from anesthesia and taken to the PACU in stable condition.  Needle, sponge, and instrument counts were correct.               [name], MD

## 2018-04-13 NOTE — Progress Notes (Signed)
Pt returns to short stay from OR per Dr Birdie Sons.  Type and cross not complete( Antibodies) - not to proceed with surgery until blood is available and first unit hanging.  Pt aware, Husband coming back to bedside.

## 2018-04-14 ENCOUNTER — Inpatient Hospital Stay: Payer: 59 | Admitting: Nutrition

## 2018-04-14 ENCOUNTER — Ambulatory Visit: Payer: 59

## 2018-04-14 ENCOUNTER — Encounter (HOSPITAL_COMMUNITY): Payer: Self-pay | Admitting: General Surgery

## 2018-04-14 LAB — TYPE AND SCREEN
ABO/RH(D): B POS
Antibody Screen: POSITIVE
DAT, IGG: NEGATIVE
Donor AG Type: NEGATIVE
Donor AG Type: NEGATIVE
PT AG Type: NEGATIVE
Unit division: 0
Unit division: 0

## 2018-04-14 LAB — BPAM RBC
Blood Product Expiration Date: 202002162359
Blood Product Expiration Date: 202002212359
ISSUE DATE / TIME: 202001271149
ISSUE DATE / TIME: 202001271244
Unit Type and Rh: 5100
Unit Type and Rh: 7300

## 2018-04-15 ENCOUNTER — Inpatient Hospital Stay: Payer: 59

## 2018-04-15 ENCOUNTER — Inpatient Hospital Stay: Payer: 59 | Admitting: Nutrition

## 2018-04-15 ENCOUNTER — Ambulatory Visit: Payer: 59

## 2018-04-15 ENCOUNTER — Other Ambulatory Visit: Payer: 59

## 2018-04-15 ENCOUNTER — Encounter: Payer: Self-pay | Admitting: Hematology

## 2018-04-15 ENCOUNTER — Inpatient Hospital Stay (HOSPITAL_BASED_OUTPATIENT_CLINIC_OR_DEPARTMENT_OTHER): Payer: 59 | Admitting: Hematology

## 2018-04-15 VITALS — BP 125/69 | HR 77 | Temp 98.0°F | Resp 18 | Wt 167.5 lb

## 2018-04-15 DIAGNOSIS — D500 Iron deficiency anemia secondary to blood loss (chronic): Secondary | ICD-10-CM

## 2018-04-15 DIAGNOSIS — C169 Malignant neoplasm of stomach, unspecified: Secondary | ICD-10-CM | POA: Diagnosis not present

## 2018-04-15 DIAGNOSIS — C162 Malignant neoplasm of body of stomach: Secondary | ICD-10-CM

## 2018-04-15 DIAGNOSIS — K922 Gastrointestinal hemorrhage, unspecified: Secondary | ICD-10-CM

## 2018-04-15 DIAGNOSIS — Z853 Personal history of malignant neoplasm of breast: Secondary | ICD-10-CM | POA: Diagnosis not present

## 2018-04-15 DIAGNOSIS — Z5111 Encounter for antineoplastic chemotherapy: Secondary | ICD-10-CM | POA: Diagnosis not present

## 2018-04-15 LAB — CBC WITH DIFFERENTIAL (CANCER CENTER ONLY)
Abs Immature Granulocytes: 0.04 10*3/uL (ref 0.00–0.07)
BASOS ABS: 0 10*3/uL (ref 0.0–0.1)
Basophils Relative: 1 %
Eosinophils Absolute: 0.2 10*3/uL (ref 0.0–0.5)
Eosinophils Relative: 3 %
HCT: 27.5 % — ABNORMAL LOW (ref 36.0–46.0)
Hemoglobin: 8.6 g/dL — ABNORMAL LOW (ref 12.0–15.0)
Immature Granulocytes: 1 %
Lymphocytes Relative: 28 %
Lymphs Abs: 1.6 10*3/uL (ref 0.7–4.0)
MCH: 28.6 pg (ref 26.0–34.0)
MCHC: 31.3 g/dL (ref 30.0–36.0)
MCV: 91.4 fL (ref 80.0–100.0)
Monocytes Absolute: 0.4 10*3/uL (ref 0.1–1.0)
Monocytes Relative: 8 %
NEUTROS PCT: 59 %
Neutro Abs: 3.3 10*3/uL (ref 1.7–7.7)
Platelet Count: 193 10*3/uL (ref 150–400)
RBC: 3.01 MIL/uL — ABNORMAL LOW (ref 3.87–5.11)
RDW: 22 % — ABNORMAL HIGH (ref 11.5–15.5)
WBC Count: 5.5 10*3/uL (ref 4.0–10.5)
nRBC: 0 % (ref 0.0–0.2)

## 2018-04-15 LAB — CMP (CANCER CENTER ONLY)
ALT: 24 U/L (ref 0–44)
ANION GAP: 7 (ref 5–15)
AST: 31 U/L (ref 15–41)
Albumin: 3.3 g/dL — ABNORMAL LOW (ref 3.5–5.0)
Alkaline Phosphatase: 66 U/L (ref 38–126)
BILIRUBIN TOTAL: 0.5 mg/dL (ref 0.3–1.2)
BUN: 15 mg/dL (ref 6–20)
CO2: 30 mmol/L (ref 22–32)
Calcium: 8.9 mg/dL (ref 8.9–10.3)
Chloride: 102 mmol/L (ref 98–111)
Creatinine: 0.7 mg/dL (ref 0.44–1.00)
GFR, Est AFR Am: >60 mL/min (ref >60)
GFR, Estimated: >60 mL/min (ref >60)
Glucose, Bld: 107 mg/dL — ABNORMAL HIGH (ref 70–99)
Potassium: 3.3 mmol/L — ABNORMAL LOW (ref 3.5–5.1)
Sodium: 139 mmol/L (ref 135–145)
TOTAL PROTEIN: 7.3 g/dL (ref 6.5–8.1)

## 2018-04-15 LAB — FERRITIN: Ferritin: 894 ng/mL — ABNORMAL HIGH (ref 11–307)

## 2018-04-15 LAB — SAMPLE TO BLOOD BANK

## 2018-04-15 MED ORDER — SODIUM CHLORIDE 0.9 % IV SOLN
Freq: Once | INTRAVENOUS | Status: AC
  Administered 2018-04-15: 09:00:00 via INTRAVENOUS
  Filled 2018-04-15: qty 250

## 2018-04-15 MED ORDER — SODIUM CHLORIDE 0.9 % IV SOLN
INTRAVENOUS | Status: DC
  Administered 2018-04-15: 11:00:00 via INTRAVENOUS
  Filled 2018-04-15: qty 250

## 2018-04-15 MED ORDER — OXALIPLATIN CHEMO INJECTION 100 MG/20ML
85.0000 mg/m2 | Freq: Once | INTRAVENOUS | Status: AC
  Administered 2018-04-15: 150 mg via INTRAVENOUS
  Filled 2018-04-15: qty 20

## 2018-04-15 MED ORDER — DEXTROSE 5 % IV SOLN
INTRAVENOUS | Status: DC
  Administered 2018-04-15: 14:00:00 via INTRAVENOUS
  Filled 2018-04-15: qty 250

## 2018-04-15 MED ORDER — SODIUM CHLORIDE 0.9 % IV SOLN
2600.0000 mg/m2 | INTRAVENOUS | Status: DC
  Administered 2018-04-15: 4650 mg via INTRAVENOUS
  Filled 2018-04-15: qty 93

## 2018-04-15 MED ORDER — ONDANSETRON HCL 8 MG PO TABS: 8.0000 mg | ORAL_TABLET | Freq: Three times a day (TID) | ORAL | 1 refills | Status: DC | PRN

## 2018-04-15 MED ORDER — PROCHLORPERAZINE MALEATE 10 MG PO TABS: 10.0000 mg | ORAL_TABLET | Freq: Four times a day (QID) | ORAL | 2 refills | Status: DC | PRN

## 2018-04-15 MED ORDER — PALONOSETRON HCL INJECTION 0.25 MG/5ML
0.2500 mg | Freq: Once | INTRAVENOUS | Status: AC
  Administered 2018-04-15: 0.25 mg via INTRAVENOUS

## 2018-04-15 MED ORDER — SODIUM CHLORIDE 0.9 % IV SOLN
510.0000 mg | Freq: Once | INTRAVENOUS | Status: AC
  Administered 2018-04-15: 510 mg via INTRAVENOUS
  Filled 2018-04-15: qty 17

## 2018-04-15 MED ORDER — HEPARIN SOD (PORK) LOCK FLUSH 100 UNIT/ML IV SOLN
500.0000 [IU] | Freq: Once | INTRAVENOUS | Status: DC | PRN
  Filled 2018-04-15: qty 5

## 2018-04-15 MED ORDER — PALONOSETRON HCL INJECTION 0.25 MG/5ML
INTRAVENOUS | Status: AC
  Filled 2018-04-15: qty 5

## 2018-04-15 MED ORDER — DEXAMETHASONE 4 MG PO TABS: 4.0000 mg | ORAL_TABLET | Freq: Two times a day (BID) | ORAL | 2 refills | Status: DC

## 2018-04-15 MED ORDER — DEXTROSE 5 % IV SOLN
Freq: Once | INTRAVENOUS | Status: AC
  Administered 2018-04-15: 14:00:00 via INTRAVENOUS
  Filled 2018-04-15: qty 250

## 2018-04-15 MED ORDER — LEUCOVORIN CALCIUM INJECTION 350 MG
196.0000 mg/m2 | Freq: Once | INTRAVENOUS | Status: AC
  Administered 2018-04-15: 350 mg via INTRAVENOUS
  Filled 2018-04-15: qty 17.5

## 2018-04-15 MED ORDER — DEXAMETHASONE SODIUM PHOSPHATE 10 MG/ML IJ SOLN
INTRAMUSCULAR | Status: AC
  Filled 2018-04-15: qty 1

## 2018-04-15 MED ORDER — SODIUM CHLORIDE 0.9% FLUSH
10.0000 mL | INTRAVENOUS | Status: DC | PRN
  Filled 2018-04-15: qty 10

## 2018-04-15 MED ORDER — DEXAMETHASONE SODIUM PHOSPHATE 10 MG/ML IJ SOLN
10.0000 mg | Freq: Once | INTRAMUSCULAR | Status: AC
  Administered 2018-04-15: 10 mg via INTRAVENOUS

## 2018-04-15 MED ORDER — SODIUM CHLORIDE 0.9 % IV SOLN
50.0000 mg/m2 | Freq: Once | INTRAVENOUS | Status: AC
  Administered 2018-04-15: 90 mg via INTRAVENOUS
  Filled 2018-04-15: qty 9

## 2018-04-15 NOTE — Progress Notes (Signed)
58 year old female diagnosed with gastric cancer receiving chemotherapy.  Patient will receive chemotherapy every 2 weeks starting today, January 29.  Past medical history includes hypertension, thyroid disease, breast cancer in 2007.  Medications include ferrous sulfate, Synthroid, Protonix, and vitamin B12.  Labs include hemoglobin 8.6.  Height: 5 feet 0 inches. Weight: 167 pounds January 29. Usual body weight: Patient unable to determine.  Reports weight fluctuates up and down. BMI: 32.71.  Patient reports she is eating well. She does not drink a lot of water.  Usually 1 cup daily. Denies recent weight loss. Denies nutrition impact symptoms, including constipation.  Reports last bowel movement was 2 days ago.  Nutrition diagnosis:  Food and nutrition related knowledge deficit related to gastric cancer and associated treatments as evidenced by no prior need for nutrition related information.  Intervention: Patient was educated to consume small, more frequent meals and snacks consisting of higher calorie, higher protein foods.  Goal is weight maintenance. Reviewed high-protein foods and provided fact sheets. Recommended patient work to increase water intake and provided suggestions on ways she can add flavor to her water. Questions were answered.  Teach back method used.  Contact information given.  Monitoring, evaluation, goals: Patient will tolerate adequate calories and protein to promote weight maintenance.  Next visit: Wednesday, February 12 during infusion.  **Disclaimer: This note was dictated with voice recognition software. Similar sounding words can inadvertently be transcribed and this note may contain transcription errors which may not have been corrected upon publication of note.**

## 2018-04-15 NOTE — Progress Notes (Signed)
FMLA successfully faxed to [employer] at [phone]. Mailed copy to patient address on file. 

## 2018-04-15 NOTE — Progress Notes (Signed)
Per Dr. Burr Medico, patient will not be needing any blood transfusions due to Hgb of 8.6., iron infusion received today and 2 units PRBC transfusion received on 04/13/2018. Patient informed that this would be rechecked during next visit. She verbalized understanding.

## 2018-04-15 NOTE — Addendum Note (Signed)
Addended by: [name] on: 04/15/2018 01:29 PM   Modules accepted: Orders

## 2018-04-15 NOTE — Patient Instructions (Signed)
[facility] Discharge Instructions for Patients Receiving Chemotherapy  Today you received the following chemotherapy agents Docetaxel (TAXOTERE), Oxaliplatin (ELOXATIN), Leucovorin & Flourouracil (ADRUCIL).  To help prevent nausea and vomiting after your treatment, we encourage you to take your nausea medication as prescribed.   If you develop nausea and vomiting that is not controlled by your nausea medication, call the clinic.   BELOW ARE SYMPTOMS THAT SHOULD BE REPORTED IMMEDIATELY:  *FEVER GREATER THAN 100.5 F  *CHILLS WITH OR WITHOUT FEVER  NAUSEA AND VOMITING THAT IS NOT CONTROLLED WITH YOUR NAUSEA MEDICATION  *UNUSUAL SHORTNESS OF BREATH  *UNUSUAL BRUISING OR BLEEDING  TENDERNESS IN MOUTH AND THROAT WITH OR WITHOUT PRESENCE OF ULCERS  *URINARY PROBLEMS  *BOWEL PROBLEMS  UNUSUAL RASH Items with * indicate a potential emergency and should be followed up as soon as possible.  Feel free to call the clinic should you have any questions or concerns. The clinic phone number is [phone].  Please show the [facility] at check-in to the Emergency Department and triage nurse.  Docetaxel injection What is this medicine? DOCETAXEL (doe se TAX el) is a chemotherapy drug. It targets fast dividing cells, like cancer cells, and causes these cells to die. This medicine is used to treat many types of cancers like breast cancer, certain stomach cancers, head and neck cancer, lung cancer, and prostate cancer. This medicine may be used for other purposes; ask your health care provider or pharmacist if you have questions. COMMON BRAND NAME(S): Docefrez, Taxotere What should I tell my health care provider before I take this medicine? They need to know if you have any of these conditions: -infection (especially a virus infection such as chickenpox, cold sores, or herpes) -liver disease -low blood counts, like low white cell, platelet, or red cell counts -an unusual  or allergic reaction to docetaxel, polysorbate 80, other chemotherapy agents, other medicines, foods, dyes, or preservatives -pregnant or trying to get pregnant -breast-feeding How should I use this medicine? This drug is given as an infusion into a vein. It is administered in a hospital or clinic by a specially trained health care professional. Talk to your pediatrician regarding the use of this medicine in children. Special care may be needed. Overdosage: If you think you have taken too much of this medicine contact a poison control center or emergency room at once. NOTE: This medicine is only for you. Do not share this medicine with others. What if I miss a dose? It is important not to miss your dose. Call your doctor or health care professional if you are unable to keep an appointment. What may interact with this medicine? -cyclosporine -erythromycin -ketoconazole -medicines to increase blood counts like filgrastim, pegfilgrastim, sargramostim -vaccines Talk to your doctor or health care professional before taking any of these medicines: -acetaminophen -aspirin -ibuprofen -ketoprofen -naproxen This list may not describe all possible interactions. Give your health care provider a list of all the medicines, herbs, non-prescription drugs, or dietary supplements you use. Also tell them if you smoke, drink alcohol, or use illegal drugs. Some items may interact with your medicine. What should I watch for while using this medicine? Your condition will be monitored carefully while you are receiving this medicine. You will need important blood work done while you are taking this medicine. This drug may make you feel generally unwell. This is not uncommon, as chemotherapy can affect healthy cells as well as cancer cells. Report any side effects. Continue your course of  treatment even though you feel ill unless your doctor tells you to stop. In some cases, you may be given additional medicines to  help with side effects. Follow all directions for their use. Call your doctor or health care professional for advice if you get a fever, chills or sore throat, or other symptoms of a cold or flu. Do not treat yourself. This drug decreases your body's ability to fight infections. Try to avoid being around people who are sick. This medicine may increase your risk to bruise or bleed. Call your doctor or health care professional if you notice any unusual bleeding. This medicine may contain alcohol in the product. You may get drowsy or dizzy. Do not drive, use machinery, or do anything that needs mental alertness until you know how this medicine affects you. Do not stand or sit up quickly, especially if you are an older patient. This reduces the risk of dizzy or fainting spells. Avoid alcoholic drinks. Do not become pregnant while taking this medicine or for 6 months after stopping it. Women should inform their doctor if they wish to become pregnant or think they might be pregnant. Men should not father a child while taking this medicine and for 3 months after stopping it. There is a potential for serious side effects to an unborn child. Talk to your health care professional or pharmacist for more information. Do not breast-feed an infant while taking this medicine or for 2 weeks after stopping it. This may interfere with the ability to father a child. You should talk to your doctor or health care professional if you are concerned about your fertility. What side effects may I notice from receiving this medicine? Side effects that you should report to your doctor or health care professional as soon as possible: -allergic reactions like skin rash, itching or hives, swelling of the face, lips, or tongue -low blood counts - This drug may decrease the number of white blood cells, red blood cells and platelets. You may be at increased risk for infections and bleeding. -signs of infection - fever or chills, cough,  sore throat, pain or difficulty passing urine -signs of decreased platelets or bleeding - bruising, pinpoint red spots on the skin, black, tarry stools, nosebleeds -signs of decreased red blood cells - unusually weak or tired, fainting spells, lightheadedness -breathing problems -fast or irregular heartbeat -low blood pressure -mouth sores -nausea and vomiting -pain, swelling, redness or irritation at the injection site -pain, tingling, numbness in the hands or feet -swelling of the ankle, feet, hands -weight gain Side effects that usually do not require medical attention (report to your doctor or health care professional if they continue or are bothersome): -bone pain -complete hair loss including hair on your head, underarms, pubic hair, eyebrows, and eyelashes -diarrhea -excessive tearing -changes in the color of fingernails -loosening of the fingernails -nausea -muscle pain -red flush to skin -sweating -weak or tired This list may not describe all possible side effects. Call your doctor for medical advice about side effects. You may report side effects to FDA at 1-800-FDA-1088. Where should I keep my medicine? This drug is given in a hospital or clinic and will not be stored at home. NOTE: This sheet is a summary. It may not cover all possible information. If you have questions about this medicine, talk to your doctor, pharmacist, or health care provider.  2019 Elsevier/Gold Standard (2017-03-31 12:07:21)  Oxaliplatin Injection What is this medicine? OXALIPLATIN (ox AL i PLA tin) is a  chemotherapy drug. It targets fast dividing cells, like cancer cells, and causes these cells to die. This medicine is used to treat cancers of the colon and rectum, and many other cancers. This medicine may be used for other purposes; ask your health care provider or pharmacist if you have questions. COMMON BRAND NAME(S): Eloxatin What should I tell my health care provider before I take this  medicine? They need to know if you have any of these conditions: -kidney disease -an unusual or allergic reaction to oxaliplatin, other chemotherapy, other medicines, foods, dyes, or preservatives -pregnant or trying to get pregnant -breast-feeding How should I use this medicine? This drug is given as an infusion into a vein. It is administered in a hospital or clinic by a specially trained health care professional. Talk to your pediatrician regarding the use of this medicine in children. Special care may be needed. Overdosage: If you think you have taken too much of this medicine contact a poison control center or emergency room at once. NOTE: This medicine is only for you. Do not share this medicine with others. What if I miss a dose? It is important not to miss a dose. Call your doctor or health care professional if you are unable to keep an appointment. What may interact with this medicine? -medicines to increase blood counts like filgrastim, pegfilgrastim, sargramostim -probenecid -some antibiotics like amikacin, gentamicin, neomycin, polymyxin B, streptomycin, tobramycin -zalcitabine Talk to your doctor or health care professional before taking any of these medicines: -acetaminophen -aspirin -ibuprofen -ketoprofen -naproxen This list may not describe all possible interactions. Give your health care provider a list of all the medicines, herbs, non-prescription drugs, or dietary supplements you use. Also tell them if you smoke, drink alcohol, or use illegal drugs. Some items may interact with your medicine. What should I watch for while using this medicine? Your condition will be monitored carefully while you are receiving this medicine. You will need important blood work done while you are taking this medicine. This medicine can make you more sensitive to cold. Do not drink cold drinks or use ice. Cover exposed skin before coming in contact with cold temperatures or cold objects. When  out in cold weather wear warm clothing and cover your mouth and nose to warm the air that goes into your lungs. Tell your doctor if you get sensitive to the cold. This drug may make you feel generally unwell. This is not uncommon, as chemotherapy can affect healthy cells as well as cancer cells. Report any side effects. Continue your course of treatment even though you feel ill unless your doctor tells you to stop. In some cases, you may be given additional medicines to help with side effects. Follow all directions for their use. Call your doctor or health care professional for advice if you get a fever, chills or sore throat, or other symptoms of a cold or flu. Do not treat yourself. This drug decreases your body's ability to fight infections. Try to avoid being around people who are sick. This medicine may increase your risk to bruise or bleed. Call your doctor or health care professional if you notice any unusual bleeding. Be careful brushing and flossing your teeth or using a toothpick because you may get an infection or bleed more easily. If you have any dental work done, tell your dentist you are receiving this medicine. Avoid taking products that contain aspirin, acetaminophen, ibuprofen, naproxen, or ketoprofen unless instructed by your doctor. These medicines may hide a fever.  Do not become pregnant while taking this medicine. Women should inform their doctor if they wish to become pregnant or think they might be pregnant. There is a potential for serious side effects to an unborn child. Talk to your health care professional or pharmacist for more information. Do not breast-feed an infant while taking this medicine. Call your doctor or health care professional if you get diarrhea. Do not treat yourself. What side effects may I notice from receiving this medicine? Side effects that you should report to your doctor or health care professional as soon as possible: -allergic reactions like skin rash,  itching or hives, swelling of the face, lips, or tongue -low blood counts - This drug may decrease the number of white blood cells, red blood cells and platelets. You may be at increased risk for infections and bleeding. -signs of infection - fever or chills, cough, sore throat, pain or difficulty passing urine -signs of decreased platelets or bleeding - bruising, pinpoint red spots on the skin, black, tarry stools, nosebleeds -signs of decreased red blood cells - unusually weak or tired, fainting spells, lightheadedness -breathing problems -chest pain, pressure -cough -diarrhea -jaw tightness -mouth sores -nausea and vomiting -pain, swelling, redness or irritation at the injection site -pain, tingling, numbness in the hands or feet -problems with balance, talking, walking -redness, blistering, peeling or loosening of the skin, including inside the mouth -trouble passing urine or change in the amount of urine Side effects that usually do not require medical attention (report to your doctor or health care professional if they continue or are bothersome): -changes in vision -constipation -hair loss -loss of appetite -metallic taste in the mouth or changes in taste -stomach pain This list may not describe all possible side effects. Call your doctor for medical advice about side effects. You may report side effects to FDA at 1-800-FDA-1088. Where should I keep my medicine? This drug is given in a hospital or clinic and will not be stored at home. NOTE: This sheet is a summary. It may not cover all possible information. If you have questions about this medicine, talk to your doctor, pharmacist, or health care provider.  2019 Elsevier/Gold Standard (2007-09-29 17:22:47)  Leucovorin injection What is this medicine? LEUCOVORIN (loo koe VOR in) is used to prevent or treat the harmful effects of some medicines. This medicine is used to treat anemia caused by a low amount of folic acid in the  body. It is also used with 5-fluorouracil (5-FU) to treat colon cancer. This medicine may be used for other purposes; ask your health care provider or pharmacist if you have questions. What should I tell my health care provider before I take this medicine? They need to know if you have any of these conditions: -anemia from low levels of vitamin B-12 in the blood -an unusual or allergic reaction to leucovorin, folic acid, other medicines, foods, dyes, or preservatives -pregnant or trying to get pregnant -breast-feeding How should I use this medicine? This medicine is for injection into a muscle or into a vein. It is given by a health care professional in a hospital or clinic setting. Talk to your pediatrician regarding the use of this medicine in children. Special care may be needed. Overdosage: If you think you have taken too much of this medicine contact a poison control center or emergency room at once. NOTE: This medicine is only for you. Do not share this medicine with others. What if I miss a dose? This does not apply.  What may interact with this medicine? -capecitabine -fluorouracil -phenobarbital -phenytoin -primidone -trimethoprim-sulfamethoxazole This list may not describe all possible interactions. Give your health care provider a list of all the medicines, herbs, non-prescription drugs, or dietary supplements you use. Also tell them if you smoke, drink alcohol, or use illegal drugs. Some items may interact with your medicine. What should I watch for while using this medicine? Your condition will be monitored carefully while you are receiving this medicine. This medicine may increase the side effects of 5-fluorouracil, 5-FU. Tell your doctor or health care professional if you have diarrhea or mouth sores that do not get better or that get worse. What side effects may I notice from receiving this medicine? Side effects that you should report to your doctor or health care  professional as soon as possible: -allergic reactions like skin rash, itching or hives, swelling of the face, lips, or tongue -breathing problems -fever, infection -mouth sores -unusual bleeding or bruising -unusually weak or tired Side effects that usually do not require medical attention (report to your doctor or health care professional if they continue or are bothersome): -constipation or diarrhea -loss of appetite -nausea, vomiting This list may not describe all possible side effects. Call your doctor for medical advice about side effects. You may report side effects to FDA at 1-800-FDA-1088. Where should I keep my medicine? This drug is given in a hospital or clinic and will not be stored at home. NOTE: This sheet is a summary. It may not cover all possible information. If you have questions about this medicine, talk to your doctor, pharmacist, or health care provider.  2019 Elsevier/Gold Standard (2007-09-08 16:50:29)  Fluorouracil, 5-FU injection What is this medicine? FLUOROURACIL, 5-FU (flure oh YOOR a sil) is a chemotherapy drug. It slows the growth of cancer cells. This medicine is used to treat many types of cancer like breast cancer, colon or rectal cancer, pancreatic cancer, and stomach cancer. This medicine may be used for other purposes; ask your health care provider or pharmacist if you have questions. COMMON BRAND NAME(S): Adrucil What should I tell my health care provider before I take this medicine? They need to know if you have any of these conditions: -blood disorders -dihydropyrimidine dehydrogenase (DPD) deficiency -infection (especially a virus infection such as chickenpox, cold sores, or herpes) -kidney disease -liver disease -malnourished, poor nutrition -recent or ongoing radiation therapy -an unusual or allergic reaction to fluorouracil, other chemotherapy, other medicines, foods, dyes, or preservatives -pregnant or trying to get  pregnant -breast-feeding How should I use this medicine? This drug is given as an infusion or injection into a vein. It is administered in a hospital or clinic by a specially trained health care professional. Talk to your pediatrician regarding the use of this medicine in children. Special care may be needed. Overdosage: If you think you have taken too much of this medicine contact a poison control center or emergency room at once. NOTE: This medicine is only for you. Do not share this medicine with others. What if I miss a dose? It is important not to miss your dose. Call your doctor or health care professional if you are unable to keep an appointment. What may interact with this medicine? -allopurinol -cimetidine -dapsone -digoxin -hydroxyurea -leucovorin -levamisole -medicines for seizures like ethotoin, fosphenytoin, phenytoin -medicines to increase blood counts like filgrastim, pegfilgrastim, sargramostim -medicines that treat or prevent blood clots like warfarin, enoxaparin, and dalteparin -methotrexate -metronidazole -pyrimethamine -some other chemotherapy drugs like busulfan, cisplatin, estramustine, vinblastine -  trimethoprim -trimetrexate -vaccines Talk to your doctor or health care professional before taking any of these medicines: -acetaminophen -aspirin -ibuprofen -ketoprofen -naproxen This list may not describe all possible interactions. Give your health care provider a list of all the medicines, herbs, non-prescription drugs, or dietary supplements you use. Also tell them if you smoke, drink alcohol, or use illegal drugs. Some items may interact with your medicine. What should I watch for while using this medicine? Visit your doctor for checks on your progress. This drug may make you feel generally unwell. This is not uncommon, as chemotherapy can affect healthy cells as well as cancer cells. Report any side effects. Continue your course of treatment even though you  feel ill unless your doctor tells you to stop. In some cases, you may be given additional medicines to help with side effects. Follow all directions for their use. Call your doctor or health care professional for advice if you get a fever, chills or sore throat, or other symptoms of a cold or flu. Do not treat yourself. This drug decreases your body's ability to fight infections. Try to avoid being around people who are sick. This medicine may increase your risk to bruise or bleed. Call your doctor or health care professional if you notice any unusual bleeding. Be careful brushing and flossing your teeth or using a toothpick because you may get an infection or bleed more easily. If you have any dental work done, tell your dentist you are receiving this medicine. Avoid taking products that contain aspirin, acetaminophen, ibuprofen, naproxen, or ketoprofen unless instructed by your doctor. These medicines may hide a fever. Do not become pregnant while taking this medicine. Women should inform their doctor if they wish to become pregnant or think they might be pregnant. There is a potential for serious side effects to an unborn child. Talk to your health care professional or pharmacist for more information. Do not breast-feed an infant while taking this medicine. Men should inform their doctor if they wish to father a child. This medicine may lower sperm counts. Do not treat diarrhea with over the counter products. Contact your doctor if you have diarrhea that lasts more than 2 days or if it is severe and watery. This medicine can make you more sensitive to the sun. Keep out of the sun. If you cannot avoid being in the sun, wear protective clothing and use sunscreen. Do not use sun lamps or tanning beds/booths. What side effects may I notice from receiving this medicine? Side effects that you should report to your doctor or health care professional as soon as possible: -allergic reactions like skin rash,  itching or hives, swelling of the face, lips, or tongue -low blood counts - this medicine may decrease the number of white blood cells, red blood cells and platelets. You may be at increased risk for infections and bleeding. -signs of infection - fever or chills, cough, sore throat, pain or difficulty passing urine -signs of decreased platelets or bleeding - bruising, pinpoint red spots on the skin, black, tarry stools, blood in the urine -signs of decreased red blood cells - unusually weak or tired, fainting spells, lightheadedness -breathing problems -changes in vision -chest pain -mouth sores -nausea and vomiting -pain, swelling, redness at site where injected -pain, tingling, numbness in the hands or feet -redness, swelling, or sores on hands or feet -stomach pain -unusual bleeding Side effects that usually do not require medical attention (report to your doctor or health care professional if they  continue or are bothersome): -changes in finger or toe nails -diarrhea -dry or itchy skin -hair loss -headache -loss of appetite -sensitivity of eyes to the light -stomach upset -unusually teary eyes This list may not describe all possible side effects. Call your doctor for medical advice about side effects. You may report side effects to FDA at 1-800-FDA-1088. Where should I keep my medicine? This drug is given in a hospital or clinic and will not be stored at home. NOTE: This sheet is a summary. It may not cover all possible information. If you have questions about this medicine, talk to your doctor, pharmacist, or health care provider.  2019 [employer] Standard (2007-07-08 13:53:16)  Ferumoxytol injection What is this medicine? FERUMOXYTOL is an iron complex. Iron is used to make healthy red blood cells, which carry oxygen and nutrients throughout the body. This medicine is used to treat iron deficiency anemia. This medicine may be used for other purposes; ask your health care  provider or pharmacist if you have questions. COMMON BRAND NAME(S): Feraheme What should I tell my health care provider before I take this medicine? They need to know if you have any of these conditions: -anemia not caused by low iron levels -high levels of iron in the blood -magnetic resonance imaging (MRI) test scheduled -an unusual or allergic reaction to iron, other medicines, foods, dyes, or preservatives -pregnant or trying to get pregnant -breast-feeding How should I use this medicine? This medicine is for injection into a vein. It is given by a health care professional in a hospital or clinic setting. Talk to your pediatrician regarding the use of this medicine in children. Special care may be needed. Overdosage: If you think you have taken too much of this medicine contact a poison control center or emergency room at once. NOTE: This medicine is only for you. Do not share this medicine with others. What if I miss a dose? It is important not to miss your dose. Call your doctor or health care professional if you are unable to keep an appointment. What may interact with this medicine? This medicine may interact with the following medications: -other iron products This list may not describe all possible interactions. Give your health care provider a list of all the medicines, herbs, non-prescription drugs, or dietary supplements you use. Also tell them if you smoke, drink alcohol, or use illegal drugs. Some items may interact with your medicine. What should I watch for while using this medicine? Visit your doctor or healthcare professional regularly. Tell your doctor or healthcare professional if your symptoms do not start to get better or if they get worse. You may need blood work done while you are taking this medicine. You may need to follow a special diet. Talk to your doctor. Foods that contain iron include: whole grains/cereals, dried fruits, beans, or peas, leafy green vegetables,  and organ meats (liver, kidney). What side effects may I notice from receiving this medicine? Side effects that you should report to your doctor or health care professional as soon as possible: -allergic reactions like skin rash, itching or hives, swelling of the face, lips, or tongue -breathing problems -changes in blood pressure -feeling faint or lightheaded, falls -fever or chills -flushing, sweating, or hot feelings -swelling of the ankles or feet Side effects that usually do not require medical attention (report to your doctor or health care professional if they continue or are bothersome): -diarrhea -headache -nausea, vomiting -stomach pain This list may not describe all possible side  effects. Call your doctor for medical advice about side effects. You may report side effects to FDA at 1-800-FDA-1088. Where should I keep my medicine? This drug is given in a hospital or clinic and will not be stored at home. NOTE: This sheet is a summary. It may not cover all possible information. If you have questions about this medicine, talk to your doctor, pharmacist, or health care provider.  2019 Elsevier/Gold Standard (2016-04-22 20:21:10)

## 2018-04-16 ENCOUNTER — Telehealth: Payer: Self-pay | Admitting: Hematology

## 2018-04-16 ENCOUNTER — Inpatient Hospital Stay: Payer: 59

## 2018-04-16 VITALS — BP 127/74 | HR 81 | Temp 98.1°F | Resp 18

## 2018-04-16 DIAGNOSIS — Z5111 Encounter for antineoplastic chemotherapy: Secondary | ICD-10-CM | POA: Diagnosis not present

## 2018-04-16 DIAGNOSIS — C162 Malignant neoplasm of body of stomach: Secondary | ICD-10-CM

## 2018-04-16 LAB — CANCER ANTIGEN 19-9: CA 19-9: 656 U/mL — ABNORMAL HIGH (ref 0–35)

## 2018-04-16 MED ORDER — HEPARIN SOD (PORK) LOCK FLUSH 100 UNIT/ML IV SOLN
500.0000 [IU] | Freq: Once | INTRAVENOUS | Status: AC | PRN
  Administered 2018-04-16: 500 [IU]
  Filled 2018-04-16: qty 5

## 2018-04-16 MED ORDER — SODIUM CHLORIDE 0.9% FLUSH
10.0000 mL | INTRAVENOUS | Status: DC | PRN
  Administered 2018-04-16: 10 mL
  Filled 2018-04-16: qty 10

## 2018-04-16 MED ORDER — PEGFILGRASTIM-CBQV 6 MG/0.6ML ~~LOC~~ SOSY
6.0000 mg | PREFILLED_SYRINGE | Freq: Once | SUBCUTANEOUS | Status: AC
  Administered 2018-04-16: 6 mg via SUBCUTANEOUS

## 2018-04-16 MED ORDER — PEGFILGRASTIM-CBQV 6 MG/0.6ML ~~LOC~~ SOSY
PREFILLED_SYRINGE | SUBCUTANEOUS | Status: AC
  Filled 2018-04-16: qty 0.6

## 2018-04-16 NOTE — Telephone Encounter (Signed)
Called patient per 01/29 los.  Patient aware of scheduled appts.

## 2018-04-17 ENCOUNTER — Other Ambulatory Visit: Payer: Self-pay | Admitting: Hematology

## 2018-04-17 ENCOUNTER — Telehealth: Payer: Self-pay

## 2018-04-17 LAB — METHYLMALONIC ACID, SERUM: METHYLMALONIC ACID, QUANTITATIVE: 169 nmol/L (ref 0–378)

## 2018-04-17 NOTE — Telephone Encounter (Signed)
  Oncology Nurse Navigator Documentation     Called and spoke with patient to assess for navigation or education needs. Patient states that she is doing well but that "the cold sensitivity is no joke." No questions, no needs. Will follow as needed.

## 2018-04-20 NOTE — Progress Notes (Signed)
Mariah Kemp   [phone] Fax:([phone]) [phone]   Clinic Follow up Note   Patient Care Team: Emeterio Reeve, DO as PCP - General (Osteopathic Medicine)  Date of Service:  04/22/2018  CHIEF COMPLAINT: f/u of Gastric Cancer   SUMMARY OF ONCOLOGIC HISTORY: Oncology History   Cancer Staging Gastric cancer [facility]) Staging form: Stomach, AJCC 8th Edition - Clinical stage from 03/29/2018: Stage IVA (cT4b, cN1, cM0) - Signed by [name], MD on 04/03/2018       Gastric cancer ([place])   03/19/2018 Procedure    Upper Endoscopy by Dr. Michail Sermon 03/29/18  IMPRESSION - Normal esophagus. - Z-line regular, 38 cm from the incisors. - Non-obstructing oozing gastric ulcer with pigmented material. Biopsied. - Normal examined duodenum. - Acute gastritis.    03/29/2018 Procedure    Colonoscopy by Dr. Michail Sermon 03/29/18 IMPRESSION - Preparation of the colon was fair. - Internal hemorrhoids. - The examined portion of the ileum was normal. - No specimens collected.    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 - SEE COMMENT. Microscopic Comment A Warthin Starry stain is negative for the presence of Helicobacter pylori organisms. Dr Vic Ripper has reviewed the case and concurs with this interpretation. Dr Michail Sermon was paged on 03/31/2018. (JBK:ecj 03/31/2018)    03/29/2018 Cancer Staging    Staging form: Stomach, AJCC 8th Edition - Clinical stage from 03/29/2018: Stage IVA (cT4b, cN1, cM0) - Signed by [name], MD on 04/03/2018    04/02/2018 Initial Diagnosis    Gastric cancer ([place])    04/02/2018 Imaging    CT CAP W Contrast 04/02/18 IMPRESSION: 1. Focal thickening lateral wall proximal stomach with protrusion of low-attenuation soft tissue beyond the expected confines of the gastric wall into the splenic hilum. Imaging features highly concerning for transmural tumor extension into the splenic  hilum. Several small nodules in this region are likely lymph nodes, concerning for metastatic disease. 2. Scattered bilateral pulmonary nodules measuring up to 5 mm. Nonspecific, but close attention on follow-up recommended as metastatic disease not excluded. 3. Small lymph nodes in the gastrohepatic ligament, but there is no gastrohepatic or hepato duodenal ligament lymphadenopathy. No evidence for liver metastases. 4.  Aortic Atherosclerois (ICD10-170.0    04/15/2018 -  Chemotherapy    FLOT4 every 2 weeks starting 04/15/18      CURRENT THERAPY:  Neoadjuvant FLOT4 every 2 weeks starting 04/15/18   INTERVAL HISTORY:  Mariah Kemp is here for a follow up of first cycle chemo. She presents to the clinic today by herself. She notes she managed last chemo well. She notes she is not eating well and she had abdominal cramping after heavy meals at night. Her appetite loss last for 3 days after infusion. She does note not drinking enough water, such as 12 ounces of water a day. When she drinks too much water it makes her feels she will vomit. She plans to eat more soup and add flavor to her water. She bought protein shake Quest but is willing to try Boost. She notes feeling dizzy during flush today, but denies dizziness elsewhere. She notes her stool remains stable and dark. She has bowel movements every 1-2 days.    REVIEW OF SYSTEMS:   Constitutional: Denies fevers, chills or abnormal weight loss (+) Low appetite Eyes: Denies blurriness of vision Ears, nose, mouth, throat, and face: Denies mucositis or sore throat Respiratory: Denies cough, dyspnea or wheezes Cardiovascular: Denies palpitation, chest  discomfort or lower extremity swelling Gastrointestinal:  Denies nausea, heartburn or change in bowel habits (+) abdominal cramping (+) Dark stool  Skin: Denies abnormal skin rashes Lymphatics: Denies new lymphadenopathy or easy bruising Neurological:Denies numbness, tingling or new  weaknesses Behavioral/Psych: Mood is stable, no new changes  All other systems were reviewed with the patient and are negative.  MEDICAL HISTORY:  Past Medical History:  Diagnosis Date  . Cancer ([place])   . Hypertension   . Thyroid disease     SURGICAL HISTORY: Past Surgical History:  Procedure Laterality Date  . ABDOMINAL HYSTERECTOMY  1996  . BILATERAL SALPINGOOPHORECTOMY  2007  . BIOPSY  03/29/2018   Procedure: BIOPSY;  Surgeon: Wilford Corner, MD;  Location: [facility];  Service: Endoscopy;;  . CARPAL TUNNEL RELEASE    . COLONOSCOPY N/A 03/29/2018   Procedure: COLONOSCOPY;  Surgeon: Wilford Corner, MD;  Location: [facility] ENDOSCOPY;  Service: Endoscopy;  Laterality: N/A;  . ESOPHAGOGASTRODUODENOSCOPY N/A 03/29/2018   Procedure: ESOPHAGOGASTRODUODENOSCOPY (EGD);  Surgeon: Wilford Corner, MD;  Location: [place];  Service: Endoscopy;  Laterality: N/A;  . LAPAROSCOPY N/A 04/13/2018   Procedure: LAPAROSCOPY DIAGNOSTIC;  Surgeon: [name], MD;  Location: [facility];  Service: General;  Laterality: N/A;  . MASTECTOMY  2007  . PORTACATH PLACEMENT Left 04/13/2018   Procedure: INSERTION PORT-A-CATH ERAS PATHWAY;  Surgeon: [name], MD;  Location: [facility];  Service: General;  Laterality: Left;  subclavian    I have reviewed the social history and family history with the patient and they are unchanged from previous note.  ALLERGIES:  has No Known Allergies.  MEDICATIONS:  Current Outpatient Medications  Medication Sig Dispense Refill  . acyclovir (ZOVIRAX) 400 MG tablet Take 1 tablet (400 mg total) by mouth 3 (three) times daily. For five days. As needed for cold sore. (Patient taking differently: Take 400 mg by mouth 3 (three) times daily as needed (cold sores.). ) 15 tablet 0  . albuterol (PROVENTIL HFA;VENTOLIN HFA) 108 (90 Base) MCG/ACT inhaler Inhale 1-2 puffs into the lungs every 4 (four) hours as needed for wheezing or shortness of breath. 2 Inhaler 3  .  budesonide-formoterol (SYMBICORT) 160-4.5 MCG/ACT inhaler Inhale 2 puffs into the lungs 2 (two) times daily. (Patient taking differently: Inhale 2 puffs into the lungs 2 (two) times daily as needed (respiratory issues.). ) 2 Inhaler 3  . dexamethasone (DECADRON) 4 MG tablet Take 1 tablet (4 mg total) by mouth 2 (two) times daily with a meal. 30 tablet 2  . ferrous sulfate 325 (65 FE) MG tablet Take 1 tablet (325 mg total) by mouth 2 (two) times daily with a meal. 90 tablet 0  . hydrochlorothiazide (HYDRODIURIL) 25 MG tablet TAKE 1 TABLET BY MOUTH ONCE DAILY (Patient taking differently: Take 25 mg by mouth daily. ) 90 tablet 1  . levothyroxine (SYNTHROID, LEVOTHROID) 150 MCG tablet Take 1 tablet (150 mcg total) by mouth daily before breakfast. 90 tablet 3  . ondansetron (ZOFRAN) 8 MG tablet Take 1 tablet (8 mg total) by mouth every 8 (eight) hours as needed for nausea or vomiting. 30 tablet 1  . oxyCODONE (OXY IR/ROXICODONE) 5 MG immediate release tablet Take 1 tablet (5 mg total) by mouth every 6 (six) hours as needed for severe pain. 10 tablet 0  . pantoprazole (PROTONIX) 40 MG tablet Take 1 tablet (40 mg total) by mouth 2 (two) times daily before a meal for 30 days. 60 tablet 0  . Prenatal Vit-Fe Fumarate-FA (PRENATAL MULTIVITAMIN) TABS tablet Take  1 tablet by mouth daily at 12 noon.    . prochlorperazine (COMPAZINE) 10 MG tablet Take 1 tablet (10 mg total) by mouth every 6 (six) hours as needed for nausea or vomiting. 30 tablet 2  . vitamin B-12 1000 MCG tablet Take 1 tablet (1,000 mcg total) by mouth daily. 30 tablet 0  . potassium chloride SA (K-DUR,KLOR-CON) 20 MEQ tablet Take 1 tablet (20 mEq total) by mouth 2 (two) times daily. Take 1 tablet by mouth twice daily x 3 days, then one daily thereafter. 36 tablet 1   No current facility-administered medications for this visit.     PHYSICAL EXAMINATION: ECOG PERFORMANCE STATUS: 1 - Symptomatic but completely ambulatory  Vitals:   04/22/18 0941   BP: 130/76  Pulse: (!) 104  Resp: 18  Temp: 97.9 F (36.6 C)  SpO2: 98%   Filed Weights   04/22/18 0941  Weight: 162 lb (73.5 kg)    GENERAL:alert, no distress and comfortable SKIN: skin color, texture, turgor are normal, no rashes or significant lesions EYES: normal, Conjunctiva are pink and non-injected, sclera clear OROPHARYNX:no exudate, no erythema and lips, buccal mucosa, and tongue normal  NECK: supple, thyroid normal size, non-tender, without nodularity LYMPH:  no palpable lymphadenopathy in the cervical, axillary or inguinal LUNGS: clear to auscultation and percussion with normal breathing effort HEART: regular rate & rhythm and no murmurs and no lower extremity edema ABDOMEN:abdomen soft, non-tender and normal bowel sounds Musculoskeletal:no cyanosis of digits and no clubbing  NEURO: alert & oriented x 3 with fluent speech, no focal motor/sensory deficits  LABORATORY DATA:  I have reviewed the data as listed CBC Latest Ref Rng & Units 04/22/2018 04/15/2018 04/13/2018  WBC 4.0 - 10.5 K/uL 15.8(H) 5.5 -  Hemoglobin 12.0 - 15.0 g/dL 9.5(L) 8.6(L) 6.3(LL)  Hematocrit 36.0 - 46.0 % 29.1(L) 27.5(L) 20.8(L)  Platelets 150 - 400 K/uL 183 193 -     CMP Latest Ref Rng & Units 04/22/2018 04/15/2018 03/28/2018  Glucose 70 - 99 mg/dL 94 107(H) 107(H)  BUN 6 - 20 mg/dL 18 15 18   Creatinine 0.44 - 1.00 mg/dL 0.76 0.70 0.78  Sodium 135 - 145 mmol/L 136 139 137  Potassium 3.5 - 5.1 mmol/L 3.0(LL) 3.3(L) 3.2(L)  Chloride 98 - 111 mmol/L 98 102 102  CO2 22 - 32 mmol/L 30 30 24   Calcium 8.9 - 10.3 mg/dL 8.9 8.9 8.9  Total Protein 6.5 - 8.1 g/dL 7.3 7.3 7.1  Total Bilirubin 0.3 - 1.2 mg/dL <0.2(L) 0.5 0.3  Alkaline Phos 38 - 126 U/L 106 66 62  AST 15 - 41 U/L 29 31 24   ALT 0 - 44 U/L 36 24 20      RADIOGRAPHIC STUDIES: I have personally reviewed the radiological images as listed and agreed with the findings in the report. No results found.   ASSESSMENT & PLAN:  Trishna Cwik is a 58 y.o. female with   1. 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  -She has seen surgeon Dr. Barry Dienes, and underwent exploratory laparoscope which was negative for peritoneal metastasis. -Given her locally advanced disease, she started neo-adjuvant chemo FLOT4 every 2 weeks for 4 months on 04/15/18.  -She tolerated first cycle chemo moderately well with low appetite and low fluid and calorie intake.  I strongly encouraged her to increase calorie and protein intake with supplemental nutrition and increase fluid intake.  -Plan to repeat CT scan after 4-5 cycles of treatment.  -In intern will monitor her response to treatment with Ca 19-9 tumor marker.  -Labs reviewed, WBC at 15.8, Hg improved to 9.5 otherwise CBC WNL, CMP still pending.  -F/u in 1 week before cycle 2 chemo    2. Anemia, iron deficient from chronic GI bleeding. -Secondary to GI blood loss  -Given 3u blood transfusion on 04/01/18, responded moderately well.  -She is s/p 2 doses of IV iron in 03/2018, she responded moderately well, Hg at 9.5 today (04/22/18). No blood transfusion today  -She still has dark stool, but no obvious sign of current GI bleeding.  -currently on oral iron and prenatal vitamin daily, will continue  -Will monitor closely with labs weekly    3. H/o stage 3 right breast cancer in 2007 -Treated with right mastectomy, reconstruction with 3 LN removed. She underwent chemotherapy for 5 months, likely AC-T. She completed Tamoxifen for at least 5 years.  -Will try to obtain medical records  4. Genetic  -I discussed her eligibility for genetic testing.  She has personal history of breast and gastric cancer.  She has been referred.     PLAN: -Labs  reviewed, no need for blood transfusion today.  -Advised her to hold HCTZ due to low fluid intake, and drink more fluids  -Lab, flush, F/u on 2/11 and FLOT4 on 2/12    No problem-specific Assessment & Plan notes found for this encounter.   No orders of the defined types were placed in this encounter.  All questions were answered. The patient knows to call the clinic with any problems, questions or concerns. No barriers to learning was detected. I spent 15 minutes counseling the patient face to face. The total time spent in the appointment was 20 minutes and more than 50% was on counseling and review of test results     [name], MD 04/22/2018   I, Joslyn Devon, am acting as scribe for [name], MD.   I have reviewed the above documentation for accuracy and completeness, and I agree with the above.

## 2018-04-21 ENCOUNTER — Other Ambulatory Visit: Payer: Self-pay

## 2018-04-21 ENCOUNTER — Telehealth: Payer: Self-pay

## 2018-04-21 NOTE — Progress Notes (Signed)
FMLA for spouse, Terrilyn Tyner, successfully faxed to [facility], [place] at [phone]. FMLA for daughter, Levander Campion, successfully faxed to [facility] at [phone]. Mailed copies to patient address on file.

## 2018-04-21 NOTE — Telephone Encounter (Signed)
Called in EMLA creme to her pharmacy, [facility].

## 2018-04-22 ENCOUNTER — Other Ambulatory Visit: Payer: Self-pay

## 2018-04-22 ENCOUNTER — Inpatient Hospital Stay: Payer: 59 | Attending: Hematology

## 2018-04-22 ENCOUNTER — Telehealth: Payer: Self-pay

## 2018-04-22 ENCOUNTER — Inpatient Hospital Stay: Payer: 59

## 2018-04-22 ENCOUNTER — Encounter: Payer: Self-pay | Admitting: Hematology

## 2018-04-22 ENCOUNTER — Inpatient Hospital Stay (HOSPITAL_BASED_OUTPATIENT_CLINIC_OR_DEPARTMENT_OTHER): Payer: 59 | Admitting: Hematology

## 2018-04-22 VITALS — BP 130/76 | HR 104 | Temp 97.9°F | Resp 18 | Ht 60.0 in | Wt 162.0 lb

## 2018-04-22 DIAGNOSIS — Z5111 Encounter for antineoplastic chemotherapy: Secondary | ICD-10-CM | POA: Diagnosis present

## 2018-04-22 DIAGNOSIS — Z9011 Acquired absence of right breast and nipple: Secondary | ICD-10-CM

## 2018-04-22 DIAGNOSIS — Z853 Personal history of malignant neoplasm of breast: Secondary | ICD-10-CM | POA: Diagnosis not present

## 2018-04-22 DIAGNOSIS — I10 Essential (primary) hypertension: Secondary | ICD-10-CM

## 2018-04-22 DIAGNOSIS — D500 Iron deficiency anemia secondary to blood loss (chronic): Secondary | ICD-10-CM | POA: Diagnosis not present

## 2018-04-22 DIAGNOSIS — C162 Malignant neoplasm of body of stomach: Secondary | ICD-10-CM | POA: Diagnosis not present

## 2018-04-22 DIAGNOSIS — Z5189 Encounter for other specified aftercare: Secondary | ICD-10-CM | POA: Insufficient documentation

## 2018-04-22 DIAGNOSIS — C169 Malignant neoplasm of stomach, unspecified: Secondary | ICD-10-CM

## 2018-04-22 DIAGNOSIS — K922 Gastrointestinal hemorrhage, unspecified: Secondary | ICD-10-CM | POA: Insufficient documentation

## 2018-04-22 LAB — CBC WITH DIFFERENTIAL (CANCER CENTER ONLY)
Abs Immature Granulocytes: 0.64 10*3/uL — ABNORMAL HIGH (ref 0.00–0.07)
BASOS PCT: 0 %
Basophils Absolute: 0 10*3/uL (ref 0.0–0.1)
Eosinophils Absolute: 0.2 10*3/uL (ref 0.0–0.5)
Eosinophils Relative: 1 %
HCT: 29.1 % — ABNORMAL LOW (ref 36.0–46.0)
Hemoglobin: 9.5 g/dL — ABNORMAL LOW (ref 12.0–15.0)
Immature Granulocytes: 4 %
Lymphocytes Relative: 17 %
Lymphs Abs: 2.7 10*3/uL (ref 0.7–4.0)
MCH: 29 pg (ref 26.0–34.0)
MCHC: 32.6 g/dL (ref 30.0–36.0)
MCV: 88.7 fL (ref 80.0–100.0)
Monocytes Absolute: 1.5 10*3/uL — ABNORMAL HIGH (ref 0.1–1.0)
Monocytes Relative: 9 %
NEUTROS ABS: 10.8 10*3/uL — AB (ref 1.7–7.7)
NEUTROS PCT: 69 %
PLATELETS: 183 10*3/uL (ref 150–400)
RBC: 3.28 MIL/uL — ABNORMAL LOW (ref 3.87–5.11)
RDW: 19.5 % — ABNORMAL HIGH (ref 11.5–15.5)
WBC Count: 15.8 10*3/uL — ABNORMAL HIGH (ref 4.0–10.5)
nRBC: 0.2 % (ref 0.0–0.2)

## 2018-04-22 LAB — CMP (CANCER CENTER ONLY)
ALT: 36 U/L (ref 0–44)
ANION GAP: 8 (ref 5–15)
AST: 29 U/L (ref 15–41)
Albumin: 3.5 g/dL (ref 3.5–5.0)
Alkaline Phosphatase: 106 U/L (ref 38–126)
BUN: 18 mg/dL (ref 6–20)
CO2: 30 mmol/L (ref 22–32)
Calcium: 8.9 mg/dL (ref 8.9–10.3)
Chloride: 98 mmol/L (ref 98–111)
Creatinine: 0.76 mg/dL (ref 0.44–1.00)
GFR, Est AFR Am: >60 mL/min (ref >60)
GFR, Estimated: >60 mL/min (ref >60)
Glucose, Bld: 94 mg/dL (ref 70–99)
Potassium: 3 mmol/L — CL (ref 3.5–5.1)
Sodium: 136 mmol/L (ref 135–145)
TOTAL PROTEIN: 7.3 g/dL (ref 6.5–8.1)
Total Bilirubin: <0.2 mg/dL — ABNORMAL LOW (ref 0.3–1.2)

## 2018-04-22 LAB — SAMPLE TO BLOOD BANK

## 2018-04-22 MED ORDER — SODIUM CHLORIDE 0.9% FLUSH
10.0000 mL | Freq: Once | INTRAVENOUS | Status: AC | PRN
  Administered 2018-04-22: 10 mL
  Filled 2018-04-22: qty 10

## 2018-04-22 MED ORDER — POTASSIUM CHLORIDE CRYS ER 20 MEQ PO TBCR: 20.0000 meq | EXTENDED_RELEASE_TABLET | Freq: Two times a day (BID) | ORAL | 1 refills | Status: DC

## 2018-04-22 MED ORDER — HEPARIN SOD (PORK) LOCK FLUSH 100 UNIT/ML IV SOLN
500.0000 [IU] | Freq: Once | INTRAVENOUS | Status: AC | PRN
  Administered 2018-04-22: 500 [IU]
  Filled 2018-04-22: qty 5

## 2018-04-22 NOTE — Telephone Encounter (Signed)
Called patient per Dr. Burr Medico potassium today is low 3.0, instructed her to take Potassium 20 meq one twice daily for 3 days, then one daily, she verbalized an understanding.  Script was sent into her pharmacy

## 2018-04-23 ENCOUNTER — Telehealth: Payer: Self-pay | Admitting: Hematology

## 2018-04-23 NOTE — Telephone Encounter (Signed)
Per 02/05 los.  Appt was already scheduled.

## 2018-04-28 ENCOUNTER — Inpatient Hospital Stay: Payer: 59

## 2018-04-28 ENCOUNTER — Inpatient Hospital Stay: Payer: 59 | Admitting: Hematology

## 2018-04-28 ENCOUNTER — Other Ambulatory Visit: Payer: Self-pay

## 2018-04-28 DIAGNOSIS — D500 Iron deficiency anemia secondary to blood loss (chronic): Secondary | ICD-10-CM

## 2018-04-28 DIAGNOSIS — I10 Essential (primary) hypertension: Secondary | ICD-10-CM

## 2018-04-28 DIAGNOSIS — C169 Malignant neoplasm of stomach, unspecified: Secondary | ICD-10-CM

## 2018-04-28 DIAGNOSIS — C162 Malignant neoplasm of body of stomach: Secondary | ICD-10-CM

## 2018-04-28 DIAGNOSIS — K922 Gastrointestinal hemorrhage, unspecified: Secondary | ICD-10-CM

## 2018-04-28 DIAGNOSIS — Z5111 Encounter for antineoplastic chemotherapy: Secondary | ICD-10-CM | POA: Diagnosis not present

## 2018-04-28 LAB — CBC WITH DIFFERENTIAL (CANCER CENTER ONLY)
Abs Immature Granulocytes: 0.49 10*3/uL — ABNORMAL HIGH (ref 0.00–0.07)
BASOS ABS: 0.1 10*3/uL (ref 0.0–0.1)
Basophils Relative: 1 %
EOS PCT: 1 %
Eosinophils Absolute: 0.1 10*3/uL (ref 0.0–0.5)
HEMATOCRIT: 26 % — AB (ref 36.0–46.0)
Hemoglobin: 8.6 g/dL — ABNORMAL LOW (ref 12.0–15.0)
IMMATURE GRANULOCYTES: 6 %
Lymphocytes Relative: 23 %
Lymphs Abs: 2 10*3/uL (ref 0.7–4.0)
MCH: 30.6 pg (ref 26.0–34.0)
MCHC: 33.1 g/dL (ref 30.0–36.0)
MCV: 92.5 fL (ref 80.0–100.0)
Monocytes Absolute: 0.6 10*3/uL (ref 0.1–1.0)
Monocytes Relative: 7 %
NRBC: 0.2 % (ref 0.0–0.2)
Neutro Abs: 5.4 10*3/uL (ref 1.7–7.7)
Neutrophils Relative %: 62 %
PLATELETS: 132 10*3/uL — AB (ref 150–400)
RBC: 2.81 MIL/uL — ABNORMAL LOW (ref 3.87–5.11)
RDW: 22.1 % — ABNORMAL HIGH (ref 11.5–15.5)
WBC Count: 8.6 10*3/uL (ref 4.0–10.5)

## 2018-04-28 LAB — CMP (CANCER CENTER ONLY)
ALT: 23 U/L (ref 0–44)
AST: 24 U/L (ref 15–41)
Albumin: 3.3 g/dL — ABNORMAL LOW (ref 3.5–5.0)
Alkaline Phosphatase: 92 U/L (ref 38–126)
Anion gap: 7 (ref 5–15)
BUN: 13 mg/dL (ref 6–20)
CO2: 26 mmol/L (ref 22–32)
Calcium: 8.7 mg/dL — ABNORMAL LOW (ref 8.9–10.3)
Chloride: 105 mmol/L (ref 98–111)
Creatinine: 0.74 mg/dL (ref 0.44–1.00)
GFR, Est AFR Am: >60 mL/min (ref >60)
GFR, Estimated: >60 mL/min (ref >60)
Glucose, Bld: 92 mg/dL (ref 70–99)
Potassium: 4.1 mmol/L (ref 3.5–5.1)
Sodium: 138 mmol/L (ref 135–145)
Total Bilirubin: <0.2 mg/dL — ABNORMAL LOW (ref 0.3–1.2)
Total Protein: 6.9 g/dL (ref 6.5–8.1)

## 2018-04-28 LAB — SAMPLE TO BLOOD BANK

## 2018-04-28 LAB — FERRITIN: Ferritin: 781 ng/mL — ABNORMAL HIGH (ref 11–307)

## 2018-04-28 MED ORDER — HEPARIN SOD (PORK) LOCK FLUSH 100 UNIT/ML IV SOLN
500.0000 [IU] | Freq: Once | INTRAVENOUS | Status: AC | PRN
  Administered 2018-04-28: 500 [IU]
  Filled 2018-04-28: qty 5

## 2018-04-28 MED ORDER — SODIUM CHLORIDE 0.9% FLUSH
10.0000 mL | Freq: Once | INTRAVENOUS | Status: AC | PRN
  Administered 2018-04-28: 10 mL
  Filled 2018-04-28: qty 10

## 2018-04-29 ENCOUNTER — Telehealth: Payer: Self-pay | Admitting: Hematology

## 2018-04-29 ENCOUNTER — Inpatient Hospital Stay (HOSPITAL_BASED_OUTPATIENT_CLINIC_OR_DEPARTMENT_OTHER): Payer: 59 | Admitting: Hematology

## 2018-04-29 ENCOUNTER — Inpatient Hospital Stay: Payer: 59 | Admitting: Nutrition

## 2018-04-29 ENCOUNTER — Other Ambulatory Visit: Payer: Self-pay | Admitting: Hematology

## 2018-04-29 ENCOUNTER — Inpatient Hospital Stay: Payer: 59

## 2018-04-29 ENCOUNTER — Encounter: Payer: Self-pay | Admitting: Hematology

## 2018-04-29 VITALS — BP 125/56 | HR 78 | Temp 98.5°F | Resp 17

## 2018-04-29 DIAGNOSIS — C162 Malignant neoplasm of body of stomach: Secondary | ICD-10-CM | POA: Diagnosis not present

## 2018-04-29 DIAGNOSIS — I10 Essential (primary) hypertension: Secondary | ICD-10-CM

## 2018-04-29 DIAGNOSIS — Z853 Personal history of malignant neoplasm of breast: Secondary | ICD-10-CM

## 2018-04-29 DIAGNOSIS — Z5111 Encounter for antineoplastic chemotherapy: Secondary | ICD-10-CM | POA: Diagnosis not present

## 2018-04-29 DIAGNOSIS — E876 Hypokalemia: Secondary | ICD-10-CM

## 2018-04-29 DIAGNOSIS — K922 Gastrointestinal hemorrhage, unspecified: Secondary | ICD-10-CM

## 2018-04-29 DIAGNOSIS — E039 Hypothyroidism, unspecified: Secondary | ICD-10-CM

## 2018-04-29 DIAGNOSIS — D500 Iron deficiency anemia secondary to blood loss (chronic): Secondary | ICD-10-CM | POA: Diagnosis not present

## 2018-04-29 DIAGNOSIS — Z9011 Acquired absence of right breast and nipple: Secondary | ICD-10-CM

## 2018-04-29 MED ORDER — PALONOSETRON HCL INJECTION 0.25 MG/5ML
0.2500 mg | Freq: Once | INTRAVENOUS | Status: AC
  Administered 2018-04-29: 0.25 mg via INTRAVENOUS

## 2018-04-29 MED ORDER — SODIUM CHLORIDE 0.9 % IV SOLN
50.0000 mg/m2 | Freq: Once | INTRAVENOUS | Status: AC
  Administered 2018-04-29: 90 mg via INTRAVENOUS
  Filled 2018-04-29: qty 9

## 2018-04-29 MED ORDER — OXALIPLATIN CHEMO INJECTION 100 MG/20ML
85.0000 mg/m2 | Freq: Once | INTRAVENOUS | Status: AC
  Administered 2018-04-29: 150 mg via INTRAVENOUS
  Filled 2018-04-29: qty 10

## 2018-04-29 MED ORDER — DEXTROSE 5 % IV SOLN
INTRAVENOUS | Status: DC
  Administered 2018-04-29 (×2): via INTRAVENOUS
  Filled 2018-04-29 (×2): qty 250

## 2018-04-29 MED ORDER — SODIUM CHLORIDE 0.9 % IV SOLN
INTRAVENOUS | Status: DC
  Filled 2018-04-29: qty 250

## 2018-04-29 MED ORDER — DEXAMETHASONE SODIUM PHOSPHATE 10 MG/ML IJ SOLN
10.0000 mg | Freq: Once | INTRAMUSCULAR | Status: AC
  Administered 2018-04-29: 10 mg via INTRAVENOUS

## 2018-04-29 MED ORDER — SODIUM CHLORIDE 0.9 % IV SOLN
2600.0000 mg/m2 | INTRAVENOUS | Status: DC
  Administered 2018-04-29: 4650 mg via INTRAVENOUS
  Filled 2018-04-29: qty 93

## 2018-04-29 MED ORDER — LEUCOVORIN CALCIUM INJECTION 350 MG
196.0000 mg/m2 | Freq: Once | INTRAVENOUS | Status: AC
  Administered 2018-04-29: 350 mg via INTRAVENOUS
  Filled 2018-04-29: qty 17.5

## 2018-04-29 MED ORDER — DEXAMETHASONE SODIUM PHOSPHATE 10 MG/ML IJ SOLN
INTRAMUSCULAR | Status: AC
  Filled 2018-04-29: qty 1

## 2018-04-29 MED ORDER — PALONOSETRON HCL INJECTION 0.25 MG/5ML
INTRAVENOUS | Status: AC
  Filled 2018-04-29: qty 5

## 2018-04-29 NOTE — Progress Notes (Signed)
[place]   Telephone:([phone]) [phone] Fax:(336) [phone]   Clinic Follow up Note   Patient Care Team: Emeterio Reeve, DO as PCP - General (Osteopathic Medicine)  Date of Service:  04/29/2018  CHIEF COMPLAINT: f/u of Gastric Cancer   SUMMARY OF ONCOLOGIC HISTORY: Oncology History   Cancer Staging Gastric cancer [facility]) Staging form: Stomach, AJCC 8th Edition - Clinical stage from 03/29/2018: Stage IVA (cT4b, cN1, cM0) - Signed by [name], MD on 04/03/2018       Gastric cancer ([place])   03/19/2018 Procedure    Upper Endoscopy by Dr. Michail Sermon 03/29/18  IMPRESSION - Normal esophagus. - Z-line regular, 38 cm from the incisors. - Non-obstructing oozing gastric ulcer with pigmented material. Biopsied. - Normal examined duodenum. - Acute gastritis.    03/29/2018 Procedure    Colonoscopy by Dr. Michail Sermon 03/29/18 IMPRESSION - Preparation of the colon was fair. - Internal hemorrhoids. - The examined portion of the ileum was normal. - No specimens collected.    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 - SEE COMMENT. Microscopic Comment A Warthin Starry stain is negative for the presence of Helicobacter pylori organisms. Dr Vic Ripper has reviewed the case and concurs with this interpretation. Dr Michail Sermon was paged on 03/31/2018. (JBK:ecj 03/31/2018)    03/29/2018 Cancer Staging    Staging form: Stomach, AJCC 8th Edition - Clinical stage from 03/29/2018: Stage IVA (cT4b, cN1, cM0) - Signed by [name], MD on 04/03/2018    04/02/2018 Initial Diagnosis    Gastric cancer ([place])    04/02/2018 Imaging    CT CAP W Contrast 04/02/18 IMPRESSION: 1. Focal thickening lateral wall proximal stomach with protrusion of low-attenuation soft tissue beyond the expected confines of the gastric wall into the splenic hilum. Imaging features highly concerning for transmural tumor extension into the splenic  hilum. Several small nodules in this region are likely lymph nodes, concerning for metastatic disease. 2. Scattered bilateral pulmonary nodules measuring up to 5 mm. Nonspecific, but close attention on follow-up recommended as metastatic disease not excluded. 3. Small lymph nodes in the gastrohepatic ligament, but there is no gastrohepatic or hepato duodenal ligament lymphadenopathy. No evidence for liver metastases. 4.  Aortic Atherosclerois (ICD10-170.0    04/15/2018 -  Chemotherapy    FLOT4 every 2 weeks starting 04/15/18      CURRENT THERAPY:  Neoadjuvant FLOT4 every 2 weeks starting 04/15/18   INTERVAL HISTORY:  Mariah Kemp is here for a follow up and second cycle chemotherapy.  She has recovered well from first cycle chemo, eating much better this week, her energy level also improved.  She denies any significant pain, or chest is comfortable.  She reports gassy and stomach bloating when she lays down, she uses 2 pillows at night.  She has gained some weight back this week.  No fever or chills.  REVIEW OF SYSTEMS:   Constitutional: Denies fevers, chills or abnormal weight loss (+) Low appetite Eyes: Denies blurriness of vision Ears, nose, mouth, throat, and face: Denies mucositis or sore throat Respiratory: Denies cough, dyspnea or wheezes Cardiovascular: Denies palpitation, chest discomfort or lower extremity swelling Gastrointestinal:  Denies nausea, heartburn or change in bowel habits (+) abdominal cramping (+) Dark stool  Skin: Denies abnormal skin rashes Lymphatics: Denies new lymphadenopathy or easy bruising Neurological:Denies numbness, tingling or new weaknesses Behavioral/Psych: Mood is stable, no new changes  All other systems were reviewed with the patient and are negative.  MEDICAL HISTORY:  Past Medical History:  Diagnosis Date  . Cancer ([place])   . Hypertension   . Thyroid disease     SURGICAL HISTORY: Past Surgical History:  Procedure Laterality  Date  . ABDOMINAL HYSTERECTOMY  1996  . BILATERAL SALPINGOOPHORECTOMY  2007  . BIOPSY  03/29/2018   Procedure: BIOPSY;  Surgeon: Wilford Corner, MD;  Location: [place];  Service: Endoscopy;;  . CARPAL TUNNEL RELEASE    . COLONOSCOPY N/A 03/29/2018   Procedure: COLONOSCOPY;  Surgeon: Wilford Corner, MD;  Location[facility] END[facility]COPY;  Service: Endoscopy;  Laterality: N/A;  . ESOPHAGOGASTRODUODENOSCOPY N/A 03/29/2018   Procedure: ESOPHAGOGASTRODUODENOSCOPY (EGD);  Surgeon: Wilford Corner, MD;  Location: [place];  Service: Endoscopy;  Laterality: N/A;  . LAPAROSCOPY N/A 04/13/2018   Procedure: LAPAROSCOPY DIAGNOSTIC;  Surgeon: [name], MD;  Location: [facility];  Service: General;  Laterality: N/A;  . MASTECTOMY  2007  . PORTACATH PLACEMENT Left 04/13/2018   Procedure: INSERTION PORT-A-CATH ERAS PATHWAY;  Surgeon: [name], MD;  Location: [facility];  Service: General;  Laterality: Left;  subclavian    I have reviewed the social history and family history with the patient and they are unchanged from previous note.  ALLERGIES:  has No Known Allergies.  MEDICATIONS:  Current Outpatient Medications  Medication Sig Dispense Refill  . acyclovir (ZOVIRAX) 400 MG tablet Take 1 tablet (400 mg total) by mouth 3 (three) times daily. For five days. As needed for cold sore. (Patient taking differently: Take 400 mg by mouth 3 (three) times daily as needed (cold sores.). ) 15 tablet 0  . albuterol (PROVENTIL HFA;VENTOLIN HFA) 108 (90 Base) MCG/ACT inhaler Inhale 1-2 puffs into the lungs every 4 (four) hours as needed for wheezing or shortness of breath. 2 Inhaler 3  . budesonide-formoterol (SYMBICORT) 160-4.5 MCG/ACT inhaler Inhale 2 puffs into the lungs 2 (two) times daily. (Patient taking differently: Inhale 2 puffs into the lungs 2 (two) times daily as needed (respiratory issues.). ) 2 Inhaler 3  . dexamethasone (DECADRON) 4 MG tablet Take 1 tablet (4 mg total) by mouth 2 (two) times daily  with a meal. 30 tablet 2  . ferrous sulfate 325 (65 FE) MG tablet Take 1 tablet (325 mg total) by mouth 2 (two) times daily with a meal. 90 tablet 0  . hydrochlorothiazide (HYDRODIURIL) 25 MG tablet TAKE 1 TABLET BY MOUTH ONCE DAILY (Patient taking differently: Take 25 mg by mouth daily. ) 90 tablet 1  . levothyroxine (SYNTHROID, LEVOTHROID) 150 MCG tablet Take 1 tablet (150 mcg total) by mouth daily before breakfast. 90 tablet 3  . ondansetron (ZOFRAN) 8 MG tablet Take 1 tablet (8 mg total) by mouth every 8 (eight) hours as needed for nausea or vomiting. 30 tablet 1  . oxyCODONE (OXY IR/ROXICODONE) 5 MG immediate release tablet Take 1 tablet (5 mg total) by mouth every 6 (six) hours as needed for severe pain. 10 tablet 0  . pantoprazole (PROTONIX) 40 MG tablet Take 1 tablet (40 mg total) by mouth 2 (two) times daily before a meal for 30 days. 60 tablet 0  . potassium chloride SA (K-DUR,KLOR-CON) 20 MEQ tablet Take 1 tablet (20 mEq total) by mouth 2 (two) times daily. Take 1 tablet by mouth twice daily x 3 days, then one daily thereafter. 36 tablet 1  . Prenatal Vit-Fe Fumarate-FA (PRENATAL MULTIVITAMIN) TABS tablet Take 1 tablet by mouth daily at 12 noon.    . prochlorperazine (COMPAZINE) 10 MG tablet Take 1 tablet (10 mg  total) by mouth every 6 (six) hours as needed for nausea or vomiting. 30 tablet 2  . vitamin B-12 1000 MCG tablet Take 1 tablet (1,000 mcg total) by mouth daily. 30 tablet 0   No current facility-administered medications for this visit.    Facility-Administered Medications Ordered in Other Visits  Medication Dose Route Frequency Provider Last Rate Last Dose  . 0.9 %  sodium chloride infusion   Intravenous Continuous [name], MD      . dexamethasone (DECADRON) injection 10 mg  10 mg Intravenous Once [name], MD      . dextrose 5 % solution   Intravenous Continuous [name], MD      . DOCEtaxel (TAXOTERE) 90 mg in sodium chloride 0.9 % 250 mL chemo infusion  50 mg/m2  (Treatment Plan Recorded) Intravenous Once [name], MD      . fluorouracil (ADRUCIL) 4,650 mg in sodium chloride 0.9 % 57 mL chemo infusion  2,600 mg/m2 (Treatment Plan Recorded) Intravenous 1 day or 1 dose [name], MD      . leucovorin 358 mg in dextrose 5 % 250 mL infusion  200 mg/m2 (Treatment Plan Recorded) Intravenous Once [name], MD      . oxaliplatin (ELOXATIN) 150 mg in dextrose 5 % 500 mL chemo infusion  85 mg/m2 (Treatment Plan Recorded) Intravenous Once [name], MD      . palonosetron (ALOXI) injection 0.25 mg  0.25 mg Intravenous Once [name], MD        PHYSICAL EXAMINATION: ECOG PERFORMANCE STATUS: 1 - Symptomatic but completely ambulatory Blood pressure 125/56, heart rate 78, respirate 17, temperature 36.9, pulse ox 100% on room air. GENERAL:alert, no distress and comfortable SKIN: skin color, texture, turgor are normal, no rashes or significant lesions EYES: normal, Conjunctiva are pink and non-injected, sclera clear OROPHARYNX:no exudate, no erythema and lips, buccal mucosa, and tongue normal  NECK: supple, thyroid normal size, non-tender, without nodularity LYMPH:  no palpable lymphadenopathy in the cervical, axillary or inguinal LUNGS: clear to auscultation and percussion with normal breathing effort HEART: regular rate & rhythm and no murmurs and no lower extremity edema ABDOMEN:abdomen soft, non-tender and normal bowel sounds Musculoskeletal:no cyanosis of digits and no clubbing  NEURO: alert & oriented x 3 with fluent speech, no focal motor/sensory deficits  LABORATORY DATA:  I have reviewed the data as listed CBC Latest Ref Rng & Units 04/28/2018 04/22/2018 04/15/2018  WBC 4.0 - 10.5 K/uL 8.6 15.8(H) 5.5  Hemoglobin 12.0 - 15.0 g/dL 8.6(L) 9.5(L) 8.6(L)  Hematocrit 36.0 - 46.0 % 26.0(L) 29.1(L) 27.5(L)  Platelets 150 - 400 K/uL 132(L) 183 193     CMP Latest Ref Rng & Units 04/28/2018 04/22/2018 04/15/2018  Glucose 70 - 99 mg/dL 92 94 107(H)  BUN 6 - 20 mg/dL  13 18 15   Creatinine 0.44 - 1.00 mg/dL 0.74 0.76 0.70  Sodium 135 - 145 mmol/L 138 136 139  Potassium 3.5 - 5.1 mmol/L 4.1 3.0(LL) 3.3(L)  Chloride 98 - 111 mmol/L 105 98 102  CO2 22 - 32 mmol/L 26 30 30   Calcium 8.9 - 10.3 mg/dL 8.7(L) 8.9 8.9  Total Protein 6.5 - 8.1 g/dL 6.9 7.3 7.3  Total Bilirubin 0.3 - 1.2 mg/dL <0.2(L) <0.2(L) 0.5  Alkaline Phos 38 - 126 U/L 92 106 66  AST 15 - 41 U/L 24 29 31   ALT 0 - 44 U/L 23 36 24      RADIOGRAPHIC STUDIES: I have personally reviewed the radiological images  as listed and agreed with the findings in the report. No results found.   ASSESSMENT & PLAN:  Mariah Kemp is a 58 y.o. female with   1. 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 -She has seen surgeon Dr. Barry Dienes, and underwent exploratory laparoscope which was negative for peritoneal metastasis. -Given her locally advanced disease, she started neo-adjuvant chemo FLOT4 every 2 weeks for 4 months on 04/15/18.  -She tolerated first cycle chemo moderately well with low appetite and low fluid and calorie intake.  She has recovered well.  Lab reviewed, adequate for treatment, will proceed cycle 2 chemo today. -Her Ellen Henri is still pending for insurance approval -Lab next week, follow-up in 2 weeks before cycle 3.  2. Anemia, iron deficient from chronic GI bleeding. -Secondary to GI blood loss  -Given 3u blood transfusion on 04/01/18, responded moderately well.  -She is s/p 2 doses of IV iron in 03/2018, she responded moderately well with improved anemia. -Hemoglobin 8.6 today, decreased from last week, likely related to chemotherapy.  She is not very symptomatic, will continue monitoring  3. H/o stage 3 right breast cancer in 2007 -Treated with right mastectomy, reconstruction with 3 LN removed. She underwent chemotherapy for 5 months, likely AC-T. She completed Tamoxifen for at least 5 years.   4. Genetic  -I  discussed her eligibility for genetic testing.  She has personal history of breast and gastric cancer.  She has been referred.   5. Hypokalemia -Improved, potassium 4.0 yesterday.  She will continue potassium chloride 20 meq daily   6. HTN and hypothyroidism -We will hold hydrochlorothiazide for now during the chemo -Follow-up with PCP  PLAN: -Lab reviewed, adequate for treatment, will proceed to cycle 2 FLOT4 today with same dose [employer] approval still pending, may have to hold it if not approved by tomorrow -Lab next week, to see if she needs blood transfusion -Follow-up in 2 weeks before cycle 3.   No problem-specific Assessment & Plan notes found for this encounter.   No orders of the defined types were placed in this encounter.  All questions were answered. The patient knows to call the clinic with any problems, questions or concerns. No barriers to learning was detected.       [name], MD 04/29/2018

## 2018-04-29 NOTE — Progress Notes (Signed)
Nutrition follow-up completed with patient during infusion for gastric cancer. Patient's weight decreased and documented as 162 pounds February 5 down from 167 pounds January 29. Patient reports she had taste alterations after chemotherapy. States most foods tasted metallic or salty. She describes poor appetite and states it was difficult to force herself to eat. She tried to increase water intake and states she "maybe did a little better." No other new nutrition impact symptoms.  Nutrition diagnosis: Food and nutrition related knowledge deficit continues.  Intervention: Patient educated to follow strategies to improve taste alterations.  Provided patient with taste alteration fact sheet. Reviewed high-protein snacks and food she could use at mealtime when her appetite is low. Stressed importance of increasing water intake. Questions were answered.  Teach back method used.  Monitoring, evaluation, goals: Patient will work to increase calories and protein to minimize weight loss throughout treatment.  Next visit: Wednesday, March 25 during infusion.  **Disclaimer: This note was dictated with voice recognition software. Similar sounding words can inadvertently be transcribed and this note may contain transcription errors which may not have been corrected upon publication of note.**

## 2018-04-29 NOTE — Patient Instructions (Addendum)
Md reviewed cbc and cmet, pt will not need blood transfusion hgb 8.5. proceed with treatment. Pt will return next week around Wednesday to recheck counts for possible transfusion.  [facility] Discharge Instructions for Patients Receiving Chemotherapy  Today you received the following chemotherapy agents Docetaxel (TAXOTERE), Oxaliplatin (ELOXATIN), Leucovorin & Flourouracil (ADRUCIL).  To help prevent nausea and vomiting after your treatment, we encourage you to take your nausea medication as prescribed.   If you develop nausea and vomiting that is not controlled by your nausea medication, call the clinic.   BELOW ARE SYMPTOMS THAT SHOULD BE REPORTED IMMEDIATELY:  *FEVER GREATER THAN 100.5 F  *CHILLS WITH OR WITHOUT FEVER  NAUSEA AND VOMITING THAT IS NOT CONTROLLED WITH YOUR NAUSEA MEDICATION  *UNUSUAL SHORTNESS OF BREATH  *UNUSUAL BRUISING OR BLEEDING  TENDERNESS IN MOUTH AND THROAT WITH OR WITHOUT PRESENCE OF ULCERS  *URINARY PROBLEMS  *BOWEL PROBLEMS  UNUSUAL RASH Items with * indicate a potential emergency and should be followed up as soon as possible.  Feel free to call the clinic should you have any questions or concerns. The clinic phone number is [phone].  Please show the [facility] at check-in to the Emergency Department and triage nurse.  Docetaxel injection What is this medicine? DOCETAXEL (doe se TAX el) is a chemotherapy drug. It targets fast dividing cells, like cancer cells, and causes these cells to die. This medicine is used to treat many types of cancers like breast cancer, certain stomach cancers, head and neck cancer, lung cancer, and prostate cancer. This medicine may be used for other purposes; ask your health care provider or pharmacist if you have questions. COMMON BRAND NAME(S): Docefrez, Taxotere What should I tell my health care provider before I take this medicine? They need to know if you have any of these  conditions: -infection (especially a virus infection such as chickenpox, cold sores, or herpes) -liver disease -low blood counts, like low white cell, platelet, or red cell counts -an unusual or allergic reaction to docetaxel, polysorbate 80, other chemotherapy agents, other medicines, foods, dyes, or preservatives -pregnant or trying to get pregnant -breast-feeding How should I use this medicine? This drug is given as an infusion into a vein. It is administered in a hospital or clinic by a specially trained health care professional. Talk to your pediatrician regarding the use of this medicine in children. Special care may be needed. Overdosage: If you think you have taken too much of this medicine contact a poison control center or emergency room at once. NOTE: This medicine is only for you. Do not share this medicine with others. What if I miss a dose? It is important not to miss your dose. Call your doctor or health care professional if you are unable to keep an appointment. What may interact with this medicine? -cyclosporine -erythromycin -ketoconazole -medicines to increase blood counts like filgrastim, pegfilgrastim, sargramostim -vaccines Talk to your doctor or health care professional before taking any of these medicines: -acetaminophen -aspirin -ibuprofen -ketoprofen -naproxen This list may not describe all possible interactions. Give your health care provider a list of all the medicines, herbs, non-prescription drugs, or dietary supplements you use. Also tell them if you smoke, drink alcohol, or use illegal drugs. Some items may interact with your medicine. What should I watch for while using this medicine? Your condition will be monitored carefully while you are receiving this medicine. You will need important blood work done while you are taking this medicine. This  drug may make you feel generally unwell. This is not uncommon, as chemotherapy can affect healthy cells as well  as cancer cells. Report any side effects. Continue your course of treatment even though you feel ill unless your doctor tells you to stop. In some cases, you may be given additional medicines to help with side effects. Follow all directions for their use. Call your doctor or health care professional for advice if you get a fever, chills or sore throat, or other symptoms of a cold or flu. Do not treat yourself. This drug decreases your body's ability to fight infections. Try to avoid being around people who are sick. This medicine may increase your risk to bruise or bleed. Call your doctor or health care professional if you notice any unusual bleeding. This medicine may contain alcohol in the product. You may get drowsy or dizzy. Do not drive, use machinery, or do anything that needs mental alertness until you know how this medicine affects you. Do not stand or sit up quickly, especially if you are an older patient. This reduces the risk of dizzy or fainting spells. Avoid alcoholic drinks. Do not become pregnant while taking this medicine or for 6 months after stopping it. Women should inform their doctor if they wish to become pregnant or think they might be pregnant. Men should not father a child while taking this medicine and for 3 months after stopping it. There is a potential for serious side effects to an unborn child. Talk to your health care professional or pharmacist for more information. Do not breast-feed an infant while taking this medicine or for 2 weeks after stopping it. This may interfere with the ability to father a child. You should talk to your doctor or health care professional if you are concerned about your fertility. What side effects may I notice from receiving this medicine? Side effects that you should report to your doctor or health care professional as soon as possible: -allergic reactions like skin rash, itching or hives, swelling of the face, lips, or tongue -low blood counts -  This drug may decrease the number of white blood cells, red blood cells and platelets. You may be at increased risk for infections and bleeding. -signs of infection - fever or chills, cough, sore throat, pain or difficulty passing urine -signs of decreased platelets or bleeding - bruising, pinpoint red spots on the skin, black, tarry stools, nosebleeds -signs of decreased red blood cells - unusually weak or tired, fainting spells, lightheadedness -breathing problems -fast or irregular heartbeat -low blood pressure -mouth sores -nausea and vomiting -pain, swelling, redness or irritation at the injection site -pain, tingling, numbness in the hands or feet -swelling of the ankle, feet, hands -weight gain Side effects that usually do not require medical attention (report to your doctor or health care professional if they continue or are bothersome): -bone pain -complete hair loss including hair on your head, underarms, pubic hair, eyebrows, and eyelashes -diarrhea -excessive tearing -changes in the color of fingernails -loosening of the fingernails -nausea -muscle pain -red flush to skin -sweating -weak or tired This list may not describe all possible side effects. Call your doctor for medical advice about side effects. You may report side effects to FDA at 1-800-FDA-1088. Where should I keep my medicine? This drug is given in a hospital or clinic and will not be stored at home. NOTE: This sheet is a summary. It may not cover all possible information. If you have questions about this medicine,  talk to your doctor, pharmacist, or health care provider.  2019 Elsevier/Gold Standard (2017-03-31 12:07:21)  Oxaliplatin Injection What is this medicine? OXALIPLATIN (ox AL i PLA tin) is a chemotherapy drug. It targets fast dividing cells, like cancer cells, and causes these cells to die. This medicine is used to treat cancers of the colon and rectum, and many other cancers. This medicine may be  used for other purposes; ask your health care provider or pharmacist if you have questions. COMMON BRAND NAME(S): Eloxatin What should I tell my health care provider before I take this medicine? They need to know if you have any of these conditions: -kidney disease -an unusual or allergic reaction to oxaliplatin, other chemotherapy, other medicines, foods, dyes, or preservatives -pregnant or trying to get pregnant -breast-feeding How should I use this medicine? This drug is given as an infusion into a vein. It is administered in a hospital or clinic by a specially trained health care professional. Talk to your pediatrician regarding the use of this medicine in children. Special care may be needed. Overdosage: If you think you have taken too much of this medicine contact a poison control center or emergency room at once. NOTE: This medicine is only for you. Do not share this medicine with others. What if I miss a dose? It is important not to miss a dose. Call your doctor or health care professional if you are unable to keep an appointment. What may interact with this medicine? -medicines to increase blood counts like filgrastim, pegfilgrastim, sargramostim -probenecid -some antibiotics like amikacin, gentamicin, neomycin, polymyxin B, streptomycin, tobramycin -zalcitabine Talk to your doctor or health care professional before taking any of these medicines: -acetaminophen -aspirin -ibuprofen -ketoprofen -naproxen This list may not describe all possible interactions. Give your health care provider a list of all the medicines, herbs, non-prescription drugs, or dietary supplements you use. Also tell them if you smoke, drink alcohol, or use illegal drugs. Some items may interact with your medicine. What should I watch for while using this medicine? Your condition will be monitored carefully while you are receiving this medicine. You will need important blood work done while you are taking this  medicine. This medicine can make you more sensitive to cold. Do not drink cold drinks or use ice. Cover exposed skin before coming in contact with cold temperatures or cold objects. When out in cold weather wear warm clothing and cover your mouth and nose to warm the air that goes into your lungs. Tell your doctor if you get sensitive to the cold. This drug may make you feel generally unwell. This is not uncommon, as chemotherapy can affect healthy cells as well as cancer cells. Report any side effects. Continue your course of treatment even though you feel ill unless your doctor tells you to stop. In some cases, you may be given additional medicines to help with side effects. Follow all directions for their use. Call your doctor or health care professional for advice if you get a fever, chills or sore throat, or other symptoms of a cold or flu. Do not treat yourself. This drug decreases your body's ability to fight infections. Try to avoid being around people who are sick. This medicine may increase your risk to bruise or bleed. Call your doctor or health care professional if you notice any unusual bleeding. Be careful brushing and flossing your teeth or using a toothpick because you may get an infection or bleed more easily. If you have any dental work done,  tell your dentist you are receiving this medicine. Avoid taking products that contain aspirin, acetaminophen, ibuprofen, naproxen, or ketoprofen unless instructed by your doctor. These medicines may hide a fever. Do not become pregnant while taking this medicine. Women should inform their doctor if they wish to become pregnant or think they might be pregnant. There is a potential for serious side effects to an unborn child. Talk to your health care professional or pharmacist for more information. Do not breast-feed an infant while taking this medicine. Call your doctor or health care professional if you get diarrhea. Do not treat yourself. What side  effects may I notice from receiving this medicine? Side effects that you should report to your doctor or health care professional as soon as possible: -allergic reactions like skin rash, itching or hives, swelling of the face, lips, or tongue -low blood counts - This drug may decrease the number of white blood cells, red blood cells and platelets. You may be at increased risk for infections and bleeding. -signs of infection - fever or chills, cough, sore throat, pain or difficulty passing urine -signs of decreased platelets or bleeding - bruising, pinpoint red spots on the skin, black, tarry stools, nosebleeds -signs of decreased red blood cells - unusually weak or tired, fainting spells, lightheadedness -breathing problems -chest pain, pressure -cough -diarrhea -jaw tightness -mouth sores -nausea and vomiting -pain, swelling, redness or irritation at the injection site -pain, tingling, numbness in the hands or feet -problems with balance, talking, walking -redness, blistering, peeling or loosening of the skin, including inside the mouth -trouble passing urine or change in the amount of urine Side effects that usually do not require medical attention (report to your doctor or health care professional if they continue or are bothersome): -changes in vision -constipation -hair loss -loss of appetite -metallic taste in the mouth or changes in taste -stomach pain This list may not describe all possible side effects. Call your doctor for medical advice about side effects. You may report side effects to FDA at 1-800-FDA-1088. Where should I keep my medicine? This drug is given in a hospital or clinic and will not be stored at home. NOTE: This sheet is a summary. It may not cover all possible information. If you have questions about this medicine, talk to your doctor, pharmacist, or health care provider.  2019 Elsevier/Gold Standard (2007-09-29 17:22:47)  Leucovorin injection What is this  medicine? LEUCOVORIN (loo koe VOR in) is used to prevent or treat the harmful effects of some medicines. This medicine is used to treat anemia caused by a low amount of folic acid in the body. It is also used with 5-fluorouracil (5-FU) to treat colon cancer. This medicine may be used for other purposes; ask your health care provider or pharmacist if you have questions. What should I tell my health care provider before I take this medicine? They need to know if you have any of these conditions: -anemia from low levels of vitamin B-12 in the blood -an unusual or allergic reaction to leucovorin, folic acid, other medicines, foods, dyes, or preservatives -pregnant or trying to get pregnant -breast-feeding How should I use this medicine? This medicine is for injection into a muscle or into a vein. It is given by a health care professional in a hospital or clinic setting. Talk to your pediatrician regarding the use of this medicine in children. Special care may be needed. Overdosage: If you think you have taken too much of this medicine contact a poison control  center or emergency room at once. NOTE: This medicine is only for you. Do not share this medicine with others. What if I miss a dose? This does not apply. What may interact with this medicine? -capecitabine -fluorouracil -phenobarbital -phenytoin -primidone -trimethoprim-sulfamethoxazole This list may not describe all possible interactions. Give your health care provider a list of all the medicines, herbs, non-prescription drugs, or dietary supplements you use. Also tell them if you smoke, drink alcohol, or use illegal drugs. Some items may interact with your medicine. What should I watch for while using this medicine? Your condition will be monitored carefully while you are receiving this medicine. This medicine may increase the side effects of 5-fluorouracil, 5-FU. Tell your doctor or health care professional if you have diarrhea or  mouth sores that do not get better or that get worse. What side effects may I notice from receiving this medicine? Side effects that you should report to your doctor or health care professional as soon as possible: -allergic reactions like skin rash, itching or hives, swelling of the face, lips, or tongue -breathing problems -fever, infection -mouth sores -unusual bleeding or bruising -unusually weak or tired Side effects that usually do not require medical attention (report to your doctor or health care professional if they continue or are bothersome): -constipation or diarrhea -loss of appetite -nausea, vomiting This list may not describe all possible side effects. Call your doctor for medical advice about side effects. You may report side effects to FDA at 1-800-FDA-1088. Where should I keep my medicine? This drug is given in a hospital or clinic and will not be stored at home. NOTE: This sheet is a summary. It may not cover all possible information. If you have questions about this medicine, talk to your doctor, pharmacist, or health care provider.  2019 Else[employer]/Gold Standard (2007-09-08 16:50:29)  Fluorouracil, 5-FU injection What is this medicine? FLUOROURACIL, 5-FU (flure oh YOOR a sil) is a chemotherapy drug. It slows the growth of cancer cells. This medicine is used to treat many types of cancer like breast cancer, colon or rectal cancer, pancreatic cancer, and stomach cancer. This medicine may be used for other purposes; ask your health care provider or pharmacist if you have questions. COMMON BRAND NAME(S): Adrucil What should I tell my health care provider before I take this medicine? They need to know if you have any of these conditions: -blood disorders -dihydropyrimidine dehydrogenase (DPD) deficiency -infection (especially a virus infection such as chickenpox, cold sores, or herpes) -kidney disease -liver disease -malnourished, poor nutrition -recent or ongoing  radiation therapy -an unusual or allergic reaction to fluorouracil, other chemotherapy, other medicines, foods, dyes, or preservatives -pregnant or trying to get pregnant -breast-feeding How should I use this medicine? This drug is given as an infusion or injection into a vein. It is administered in a hospital or clinic by a specially trained health care professional. Talk to your pediatrician regarding the use of this medicine in children. Special care may be needed. Overdosage: If you think you have taken too much of this medicine contact a poison control center or emergency room at once. NOTE: This medicine is only for you. Do not share this medicine with others. What if I miss a dose? It is important not to miss your dose. Call your doctor or health care professional if you are unable to keep an appointment. What may interact with this medicine? -allopurinol -cimetidine -dapsone -digoxin -hydroxyurea -leucovorin -levamisole -medicines for seizures like ethotoin, fosphenytoin, phenytoin -medicines to increase  blood counts like filgrastim, pegfilgrastim, sargramostim -medicines that treat or prevent blood clots like warfarin, enoxaparin, and dalteparin -methotrexate -metronidazole -pyrimethamine -some other chemotherapy drugs like busulfan, cisplatin, estramustine, vinblastine -trimethoprim -trimetrexate -vaccines Talk to your doctor or health care professional before taking any of these medicines: -acetaminophen -aspirin -ibuprofen -ketoprofen -naproxen This list may not describe all possible interactions. Give your health care provider a list of all the medicines, herbs, non-prescription drugs, or dietary supplements you use. Also tell them if you smoke, drink alcohol, or use illegal drugs. Some items may interact with your medicine. What should I watch for while using this medicine? Visit your doctor for checks on your progress. This drug may make you feel generally unwell.  This is not uncommon, as chemotherapy can affect healthy cells as well as cancer cells. Report any side effects. Continue your course of treatment even though you feel ill unless your doctor tells you to stop. In some cases, you may be given additional medicines to help with side effects. Follow all directions for their use. Call your doctor or health care professional for advice if you get a fever, chills or sore throat, or other symptoms of a cold or flu. Do not treat yourself. This drug decreases your body's ability to fight infections. Try to avoid being around people who are sick. This medicine may increase your risk to bruise or bleed. Call your doctor or health care professional if you notice any unusual bleeding. Be careful brushing and flossing your teeth or using a toothpick because you may get an infection or bleed more easily. If you have any dental work done, tell your dentist you are receiving this medicine. Avoid taking products that contain aspirin, acetaminophen, ibuprofen, naproxen, or ketoprofen unless instructed by your doctor. These medicines may hide a fever. Do not become pregnant while taking this medicine. Women should inform their doctor if they wish to become pregnant or think they might be pregnant. There is a potential for serious side effects to an unborn child. Talk to your health care professional or pharmacist for more information. Do not breast-feed an infant while taking this medicine. Men should inform their doctor if they wish to father a child. This medicine may lower sperm counts. Do not treat diarrhea with over the counter products. Contact your doctor if you have diarrhea that lasts more than 2 days or if it is severe and watery. This medicine can make you more sensitive to the sun. Keep out of the sun. If you cannot avoid being in the sun, wear protective clothing and use sunscreen. Do not use sun lamps or tanning beds/booths. What side effects may I notice from  receiving this medicine? Side effects that you should report to your doctor or health care professional as soon as possible: -allergic reactions like skin rash, itching or hives, swelling of the face, lips, or tongue -low blood counts - this medicine may decrease the number of white blood cells, red blood cells and platelets. You may be at increased risk for infections and bleeding. -signs of infection - fever or chills, cough, sore throat, pain or difficulty passing urine -signs of decreased platelets or bleeding - bruising, pinpoint red spots on the skin, black, tarry stools, blood in the urine -signs of decreased red blood cells - unusually weak or tired, fainting spells, lightheadedness -breathing problems -changes in vision -chest pain -mouth sores -nausea and vomiting -pain, swelling, redness at site where injected -pain, tingling, numbness in the hands or feet -redness,  swelling, or sores on hands or feet -stomach pain -unusual bleeding Side effects that usually do not require medical attention (report to your doctor or health care professional if they continue or are bothersome): -changes in finger or toe nails -diarrhea -dry or itchy skin -hair loss -headache -loss of appetite -sensitivity of eyes to the light -stomach upset -unusually teary eyes This list may not describe all possible side effects. Call your doctor for medical advice about side effects. You may report side effects to FDA at 1-800-FDA-1088. Where should I keep my medicine? This drug is given in a hospital or clinic and will not be stored at home. NOTE: This sheet is a summary. It may not cover all possible information. If you have questions about this medicine, talk to your doctor, pharmacist, or health care provider.  2019 [employer]/Gold Standard (2007-07-08 13:53:16)  Ferumoxytol injection What is this medicine? FERUMOXYTOL is an iron complex. Iron is used to make healthy red blood cells, which carry  oxygen and nutrients throughout the body. This medicine is used to treat iron deficiency anemia. This medicine may be used for other purposes; ask your health care provider or pharmacist if you have questions. COMMON BRAND NAME(S): Feraheme What should I tell my health care provider before I take this medicine? They need to know if you have any of these conditions: -anemia not caused by low iron levels -high levels of iron in the blood -magnetic resonance imaging (MRI) test scheduled -an unusual or allergic reaction to iron, other medicines, foods, dyes, or preservatives -pregnant or trying to get pregnant -breast-feeding How should I use this medicine? This medicine is for injection into a vein. It is given by a health care professional in a hospital or clinic setting. Talk to your pediatrician regarding the use of this medicine in children. Special care may be needed. Overdosage: If you think you have taken too much of this medicine contact a poison control center or emergency room at once. NOTE: This medicine is only for you. Do not share this medicine with others. What if I miss a dose? It is important not to miss your dose. Call your doctor or health care professional if you are unable to keep an appointment. What may interact with this medicine? This medicine may interact with the following medications: -other iron products This list may not describe all possible interactions. Give your health care provider a list of all the medicines, herbs, non-prescription drugs, or dietary supplements you use. Also tell them if you smoke, drink alcohol, or use illegal drugs. Some items may interact with your medicine. What should I watch for while using this medicine? Visit your doctor or healthcare professional regularly. Tell your doctor or healthcare professional if your symptoms do not start to get better or if they get worse. You may need blood work done while you are taking this medicine. You  may need to follow a special diet. Talk to your doctor. Foods that contain iron include: whole grains/cereals, dried fruits, beans, or peas, leafy green vegetables, and organ meats (liver, kidney). What side effects may I notice from receiving this medicine? Side effects that you should report to your doctor or health care professional as soon as possible: -allergic reactions like skin rash, itching or hives, swelling of the face, lips, or tongue -breathing problems -changes in blood pressure -feeling faint or lightheaded, falls -fever or chills -flushing, sweating, or hot feelings -swelling of the ankles or feet Side effects that usually do not require  medical attention (report to your doctor or health care professional if they continue or are bothersome): -diarrhea -headache -nausea, vomiting -stomach pain This list may not describe all possible side effects. Call your doctor for medical advice about side effects. You may report side effects to FDA at 1-800-FDA-1088. Where should I keep my medicine? This drug is given in a hospital or clinic and will not be stored at home. NOTE: This sheet is a summary. It may not cover all possible information. If you have questions about this medicine, talk to your doctor, pharmacist, or health care provider.  2019 Elsevier/Gold Standard (2016-04-22 20:21:10)

## 2018-04-29 NOTE — Telephone Encounter (Signed)
No los per 2/12.

## 2018-04-29 NOTE — Telephone Encounter (Signed)
No los per 2/11. 

## 2018-04-29 NOTE — Progress Notes (Signed)
CBC and CMET reviewed by MD ok to treat despite labs.

## 2018-04-30 ENCOUNTER — Inpatient Hospital Stay: Payer: 59

## 2018-04-30 VITALS — BP 132/70 | HR 88 | Temp 98.1°F | Resp 18

## 2018-04-30 DIAGNOSIS — Z5111 Encounter for antineoplastic chemotherapy: Secondary | ICD-10-CM | POA: Diagnosis not present

## 2018-04-30 DIAGNOSIS — C162 Malignant neoplasm of body of stomach: Secondary | ICD-10-CM

## 2018-04-30 MED ORDER — HEPARIN SOD (PORK) LOCK FLUSH 100 UNIT/ML IV SOLN
500.0000 [IU] | Freq: Once | INTRAVENOUS | Status: AC | PRN
  Administered 2018-04-30: 500 [IU]
  Filled 2018-04-30: qty 5

## 2018-04-30 MED ORDER — PEGFILGRASTIM-CBQV 6 MG/0.6ML ~~LOC~~ SOSY
6.0000 mg | PREFILLED_SYRINGE | Freq: Once | SUBCUTANEOUS | Status: AC
  Administered 2018-04-30: 6 mg via SUBCUTANEOUS

## 2018-04-30 MED ORDER — SODIUM CHLORIDE 0.9% FLUSH
10.0000 mL | INTRAVENOUS | Status: DC | PRN
  Administered 2018-04-30: 10 mL
  Filled 2018-04-30: qty 10

## 2018-04-30 MED ORDER — PEGFILGRASTIM-CBQV 6 MG/0.6ML ~~LOC~~ SOSY
PREFILLED_SYRINGE | SUBCUTANEOUS | Status: AC
  Filled 2018-04-30: qty 0.6

## 2018-05-01 ENCOUNTER — Other Ambulatory Visit: Payer: Self-pay | Admitting: *Deleted

## 2018-05-06 ENCOUNTER — Inpatient Hospital Stay: Payer: 59

## 2018-05-06 DIAGNOSIS — D500 Iron deficiency anemia secondary to blood loss (chronic): Secondary | ICD-10-CM

## 2018-05-06 DIAGNOSIS — Z5111 Encounter for antineoplastic chemotherapy: Secondary | ICD-10-CM | POA: Diagnosis not present

## 2018-05-06 DIAGNOSIS — C169 Malignant neoplasm of stomach, unspecified: Secondary | ICD-10-CM

## 2018-05-06 DIAGNOSIS — Z95828 Presence of other vascular implants and grafts: Secondary | ICD-10-CM

## 2018-05-06 LAB — CMP (CANCER CENTER ONLY)
ALT: 36 U/L (ref 0–44)
AST: 27 U/L (ref 15–41)
Albumin: 3.6 g/dL (ref 3.5–5.0)
Alkaline Phosphatase: 113 U/L (ref 38–126)
Anion gap: 9 (ref 5–15)
BUN: 12 mg/dL (ref 6–20)
CO2: 25 mmol/L (ref 22–32)
Calcium: 8.5 mg/dL — ABNORMAL LOW (ref 8.9–10.3)
Chloride: 105 mmol/L (ref 98–111)
Creatinine: 0.7 mg/dL (ref 0.44–1.00)
GFR, Est AFR Am: >60 mL/min (ref >60)
GFR, Estimated: >60 mL/min (ref >60)
Glucose, Bld: 103 mg/dL — ABNORMAL HIGH (ref 70–99)
Potassium: 3.6 mmol/L (ref 3.5–5.1)
Sodium: 139 mmol/L (ref 135–145)
Total Bilirubin: <0.2 mg/dL — ABNORMAL LOW (ref 0.3–1.2)
Total Protein: 7.1 g/dL (ref 6.5–8.1)

## 2018-05-06 LAB — CBC WITH DIFFERENTIAL (CANCER CENTER ONLY)
Abs Immature Granulocytes: 0.27 10*3/uL — ABNORMAL HIGH (ref 0.00–0.07)
Basophils Absolute: 0 10*3/uL (ref 0.0–0.1)
Basophils Relative: 0 %
Eosinophils Absolute: 0.1 10*3/uL (ref 0.0–0.5)
Eosinophils Relative: 1 %
HCT: 27.9 % — ABNORMAL LOW (ref 36.0–46.0)
HEMOGLOBIN: 9.1 g/dL — AB (ref 12.0–15.0)
Immature Granulocytes: 2 %
LYMPHS ABS: 2.2 10*3/uL (ref 0.7–4.0)
Lymphocytes Relative: 17 %
MCH: 31 pg (ref 26.0–34.0)
MCHC: 32.6 g/dL (ref 30.0–36.0)
MCV: 94.9 fL (ref 80.0–100.0)
MONOS PCT: 13 %
Monocytes Absolute: 1.7 10*3/uL — ABNORMAL HIGH (ref 0.1–1.0)
Neutro Abs: 8.5 10*3/uL — ABNORMAL HIGH (ref 1.7–7.7)
Neutrophils Relative %: 67 %
Platelet Count: 177 10*3/uL (ref 150–400)
RBC: 2.94 MIL/uL — ABNORMAL LOW (ref 3.87–5.11)
RDW: 21.6 % — ABNORMAL HIGH (ref 11.5–15.5)
WBC Count: 12.9 10*3/uL — ABNORMAL HIGH (ref 4.0–10.5)
nRBC: 0 % (ref 0.0–0.2)

## 2018-05-06 LAB — SAMPLE TO BLOOD BANK

## 2018-05-06 MED ORDER — SODIUM CHLORIDE 0.9% FLUSH
3.0000 mL | Freq: Once | INTRAVENOUS | Status: DC | PRN
  Filled 2018-05-06: qty 10

## 2018-05-06 MED ORDER — HEPARIN SOD (PORK) LOCK FLUSH 100 UNIT/ML IV SOLN
500.0000 [IU] | Freq: Once | INTRAVENOUS | Status: AC
  Administered 2018-05-06: 500 [IU] via INTRAVENOUS
  Filled 2018-05-06: qty 5

## 2018-05-06 MED ORDER — HEPARIN SOD (PORK) LOCK FLUSH 100 UNIT/ML IV SOLN
250.0000 [IU] | Freq: Once | INTRAVENOUS | Status: DC | PRN
  Filled 2018-05-06: qty 5

## 2018-05-06 MED ORDER — SODIUM CHLORIDE 0.9% FLUSH
10.0000 mL | INTRAVENOUS | Status: DC | PRN
  Administered 2018-05-06: 10 mL via INTRAVENOUS
  Filled 2018-05-06: qty 10

## 2018-05-07 ENCOUNTER — Telehealth: Payer: Self-pay

## 2018-05-07 ENCOUNTER — Other Ambulatory Visit: Payer: Self-pay | Admitting: Osteopathic Medicine

## 2018-05-07 NOTE — Telephone Encounter (Signed)
Spoke with patient regarding lab results, per Dr. Burr Medico anemia is better, no need for blood transfusion this week, patient verbalized an understanding.

## 2018-05-07 NOTE — Telephone Encounter (Signed)
-----   Message from [name], MD sent at 05/06/2018  6:51 PM EST ----- Please let her know lab results, anemia is better, no need for blood transfusion this week, thanks   [name]  05/06/2018

## 2018-05-13 ENCOUNTER — Inpatient Hospital Stay: Payer: 59

## 2018-05-13 ENCOUNTER — Inpatient Hospital Stay (HOSPITAL_BASED_OUTPATIENT_CLINIC_OR_DEPARTMENT_OTHER): Payer: 59 | Admitting: Medical

## 2018-05-13 ENCOUNTER — Encounter: Payer: Self-pay | Admitting: Medical

## 2018-05-13 VITALS — BP 130/84 | HR 82 | Temp 97.8°F | Resp 18 | Ht 60.0 in | Wt 166.0 lb

## 2018-05-13 DIAGNOSIS — R109 Unspecified abdominal pain: Secondary | ICD-10-CM

## 2018-05-13 DIAGNOSIS — D500 Iron deficiency anemia secondary to blood loss (chronic): Secondary | ICD-10-CM

## 2018-05-13 DIAGNOSIS — C169 Malignant neoplasm of stomach, unspecified: Secondary | ICD-10-CM

## 2018-05-13 DIAGNOSIS — C162 Malignant neoplasm of body of stomach: Secondary | ICD-10-CM

## 2018-05-13 DIAGNOSIS — Z5111 Encounter for antineoplastic chemotherapy: Secondary | ICD-10-CM | POA: Diagnosis not present

## 2018-05-13 DIAGNOSIS — Z95828 Presence of other vascular implants and grafts: Secondary | ICD-10-CM

## 2018-05-13 LAB — CBC WITH DIFFERENTIAL (CANCER CENTER ONLY)
Abs Immature Granulocytes: 0.34 10*3/uL — ABNORMAL HIGH (ref 0.00–0.07)
BASOS PCT: 0 %
Basophils Absolute: 0 10*3/uL (ref 0.0–0.1)
Eosinophils Absolute: 0 10*3/uL (ref 0.0–0.5)
Eosinophils Relative: 0 %
HCT: 29.9 % — ABNORMAL LOW (ref 36.0–46.0)
Hemoglobin: 9.9 g/dL — ABNORMAL LOW (ref 12.0–15.0)
IMMATURE GRANULOCYTES: 2 %
Lymphocytes Relative: 9 %
Lymphs Abs: 1.4 10*3/uL (ref 0.7–4.0)
MCH: 30.8 pg (ref 26.0–34.0)
MCHC: 33.1 g/dL (ref 30.0–36.0)
MCV: 93.1 fL (ref 80.0–100.0)
MONOS PCT: 4 %
Monocytes Absolute: 0.7 10*3/uL (ref 0.1–1.0)
NEUTROS ABS: 13.5 10*3/uL — AB (ref 1.7–7.7)
Neutrophils Relative %: 85 %
Platelet Count: 177 10*3/uL (ref 150–400)
RBC: 3.21 MIL/uL — ABNORMAL LOW (ref 3.87–5.11)
RDW: 22.5 % — AB (ref 11.5–15.5)
WBC Count: 15.9 10*3/uL — ABNORMAL HIGH (ref 4.0–10.5)
nRBC: 0 % (ref 0.0–0.2)

## 2018-05-13 LAB — CMP (CANCER CENTER ONLY)
ALT: 33 U/L (ref 0–44)
AST: 25 U/L (ref 15–41)
Albumin: 3.5 g/dL (ref 3.5–5.0)
Alkaline Phosphatase: 90 U/L (ref 38–126)
Anion gap: 10 (ref 5–15)
BUN: 18 mg/dL (ref 6–20)
CO2: 26 mmol/L (ref 22–32)
Calcium: 9.7 mg/dL (ref 8.9–10.3)
Chloride: 105 mmol/L (ref 98–111)
Creatinine: 0.68 mg/dL (ref 0.44–1.00)
GFR, Est AFR Am: >60 mL/min (ref >60)
GFR, Estimated: >60 mL/min (ref >60)
Glucose, Bld: 124 mg/dL — ABNORMAL HIGH (ref 70–99)
POTASSIUM: 3.7 mmol/L (ref 3.5–5.1)
Sodium: 141 mmol/L (ref 135–145)
Total Bilirubin: <0.2 mg/dL — ABNORMAL LOW (ref 0.3–1.2)
Total Protein: 7.1 g/dL (ref 6.5–8.1)

## 2018-05-13 LAB — SAMPLE TO BLOOD BANK

## 2018-05-13 LAB — FERRITIN: Ferritin: 1250 ng/mL — ABNORMAL HIGH (ref 11–307)

## 2018-05-13 MED ORDER — OXALIPLATIN CHEMO INJECTION 100 MG/20ML
85.0000 mg/m2 | Freq: Once | INTRAVENOUS | Status: AC
  Administered 2018-05-13: 150 mg via INTRAVENOUS
  Filled 2018-05-13: qty 20

## 2018-05-13 MED ORDER — HEPARIN SOD (PORK) LOCK FLUSH 100 UNIT/ML IV SOLN
500.0000 [IU] | Freq: Once | INTRAVENOUS | Status: DC | PRN
  Filled 2018-05-13: qty 5

## 2018-05-13 MED ORDER — SODIUM CHLORIDE 0.9% FLUSH
10.0000 mL | Freq: Once | INTRAVENOUS | Status: AC
  Administered 2018-05-13: 10 mL
  Filled 2018-05-13: qty 10

## 2018-05-13 MED ORDER — LEUCOVORIN CALCIUM INJECTION 350 MG
200.0000 mg/m2 | Freq: Once | INTRAVENOUS | Status: AC
  Administered 2018-05-13: 358 mg via INTRAVENOUS
  Filled 2018-05-13: qty 17.9

## 2018-05-13 MED ORDER — PALONOSETRON HCL INJECTION 0.25 MG/5ML
INTRAVENOUS | Status: AC
  Filled 2018-05-13: qty 5

## 2018-05-13 MED ORDER — DEXTROSE 5 % IV SOLN
INTRAVENOUS | Status: DC
  Administered 2018-05-13: 12:00:00 via INTRAVENOUS
  Filled 2018-05-13: qty 250

## 2018-05-13 MED ORDER — PALONOSETRON HCL INJECTION 0.25 MG/5ML
0.2500 mg | Freq: Once | INTRAVENOUS | Status: AC
  Administered 2018-05-13: 0.25 mg via INTRAVENOUS

## 2018-05-13 MED ORDER — SODIUM CHLORIDE 0.9 % IV SOLN
50.0000 mg/m2 | Freq: Once | INTRAVENOUS | Status: AC
  Administered 2018-05-13: 90 mg via INTRAVENOUS
  Filled 2018-05-13: qty 9

## 2018-05-13 MED ORDER — DEXAMETHASONE SODIUM PHOSPHATE 10 MG/ML IJ SOLN
INTRAMUSCULAR | Status: AC
  Filled 2018-05-13: qty 1

## 2018-05-13 MED ORDER — SODIUM CHLORIDE 0.9% FLUSH
10.0000 mL | INTRAVENOUS | Status: DC | PRN
  Filled 2018-05-13: qty 10

## 2018-05-13 MED ORDER — SODIUM CHLORIDE 0.9 % IV SOLN
2600.0000 mg/m2 | INTRAVENOUS | Status: DC
  Administered 2018-05-13: 4650 mg via INTRAVENOUS
  Filled 2018-05-13: qty 93

## 2018-05-13 MED ORDER — SODIUM CHLORIDE 0.9 % IV SOLN
INTRAVENOUS | Status: DC
  Administered 2018-05-13: 10:00:00 via INTRAVENOUS
  Filled 2018-05-13: qty 250

## 2018-05-13 MED ORDER — DEXAMETHASONE SODIUM PHOSPHATE 10 MG/ML IJ SOLN
10.0000 mg | Freq: Once | INTRAMUSCULAR | Status: AC
  Administered 2018-05-13: 10 mg via INTRAVENOUS

## 2018-05-13 NOTE — Progress Notes (Signed)
[facility] Progress Note   Mariah Kemp [id number] 16-Aug-1960 58 y.o.  Mariah Kemp is managed by Dr. [name]   Actively treated with chemotherapy/immunotherapy/hormonal therapy: yes  Current therapy: neo-adjuvant chemoFLOT4 every 2 weeks for 4 months  Last treated: 04/29/2018 (cycle 2)  Next scheduled appointment with provider: 05/27/2018  Assessment: Plan:    Malignant neoplasm of body of stomach ([place])   Malignant neoplasm of the stomach: Ms. Sprung presents for consideration of cycle 3 of neo-adjuvant chemoFLOT4 today.  Please see After Visit Summary for patient specific instructions.  Future Appointments  Date Time Provider [place]  05/13/2018 10:30 AM [facility] INFUSION [facility] None  05/14/2018  2:45 PM [facility] [place] FLUSH [facility] None  05/27/2018  7:45 AM [facility] LAB 5 [facility] None  05/27/2018  8:00 AM [facility] [place] None  05/27/2018  8:15 AM [name], MD [facility] None  05/27/2018  9:00 AM [facility] INFUSION [facility] None  06/10/2018  8:15 AM [facility] LAB 5 [facility] None  06/10/2018  8:30 AM [facility] [place] [facility] None  06/10/2018  9:00 AM Andrw Mcguirt, Lucianne Lei E., PA-C [facility] None  06/10/2018 10:00 AM [facility] INFUSION [facility] None  06/10/2018 12:00 PM Neff, Barbara L, RD [facility] None    No orders of the defined types were placed in this encounter.      Subjective:   Patient ID:  [name] is a 58 y.o. (DOB 10/11/1960) female.  Chief Complaint: No chief complaint on file.   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 [name]. She presents for consideration of cycle 3 of neo-adjuvant chemoFLOT4 today.  She reports that she is doing well overall except for belching and having abdominal cramping on the 2 days following chemotherapy.  She denies nausea, vomiting,  constipation, diarrhea, melena, bright red blood per rectum, fevers, chills, or sweats.  Her appetite is stable.  Medications: I have reviewed the patient's current medications.  Allergies: No Known Allergies  Past Medical History:  Diagnosis Date  . Cancer ([place])   . Hypertension   . Thyroid disease     Past Surgical History:  Procedure Laterality Date  . ABDOMINAL HYSTERECTOMY  1996  . BILATERAL SALPINGOOPHORECTOMY  2007  . BIOPSY  03/29/2018   Procedure: BIOPSY;  Surgeon: Wilford Corner, MD;  Location: [place];  Service: Endoscopy;;  . CARPAL TUNNEL RELEASE    . COLONOSCOPY N/A 03/29/2018   Procedure: COLONOSCOPY;  Surgeon: Wilford Corner, MD;  Location: [facility] ENDOSCOPY;  Service: Endoscopy;  Laterality: N/A;  . ESOPHAGOGASTRODUODENOSCOPY N/A 03/29/2018   Procedure: ESOPHAGOGASTRODUODENOSCOPY (EGD);  Surgeon: Wilford Corner, MD;  Location: [place];  Service: Endoscopy;  Laterality: N/A;  . LAPAROSCOPY N/A 04/13/2018   Procedure: LAPAROSCOPY DIAGNOSTIC;  Surgeon: [name], MD;  Location: [facility];  Service: General;  Laterality: N/A;  . MASTECTOMY  2007  . PORTACATH PLACEMENT Left 04/13/2018   Procedure: INSERTION PORT-A-CATH ERAS PATHWAY;  Surgeon: [name], MD;  Location: [facility];  Service: General;  Laterality: Left;  subclavian    Family History  Problem Relation Age of Onset  . Cancer Father        liver CA  . Cancer Paternal Uncle        unknown type cancer    Social History   Socioeconomic History  . Marital status: Married    Spouse name: Not on file  . Number  of children: 2  . Years of education: Not on file  . Highest education level: Not on file  Occupational History  . Not on file  Social Needs  . Financial resource strain: Not on file  . Food insecurity:    Worry: Not on file    Inability: Not on file  . Transportation needs:    Medical: Not on file    Non-medical: Not on file  Tobacco Use  . Smoking status: Former Smoker     Packs/day: 0.50    Years: 15.00    Pack years: 7.50    Last attempt to quit: 04/27/1994    Years since quitting: 24.0  . Smokeless tobacco: Never Used  Substance and Sexual Activity  . Alcohol use: No    Alcohol/week: 0.0 standard drinks  . Drug use: No  . Sexual activity: Never    Partners: Male  Lifestyle  . Physical activity:    Days per week: Not on file    Minutes per session: Not on file  . Stress: Not on file  Relationships  . Social connections:    Talks on phone: Not on file    Gets together: Not on file    Attends religious service: Not on file    Active member of club or organization: Not on file    Attends meetings of clubs or organizations: Not on file    Relationship status: Not on file  . Intimate partner violence:    Fear of current or ex partner: Not on file    Emotionally abused: Not on file    Physically abused: Not on file    Forced sexual activity: Not on file  Other Topics Concern  . Not on file  Social History Narrative  . Not on file    Past Medical History, Surgical history, Social history, and Family history were reviewed and updated as appropriate.   Please see review of systems for further details on the patient's review from today.   Review of Systems:  Review of Systems  Constitutional: Negative for activity change, appetite change, chills, diaphoresis and fever.  HENT: Negative for trouble swallowing and voice change.   Respiratory: Negative for cough, chest tightness, shortness of breath and wheezing.   Cardiovascular: Negative for chest pain, palpitations and leg swelling.  Gastrointestinal: Positive for abdominal pain (Abdominal cramping and belching for 2 days following chemotherapy.). Negative for abdominal distention, constipation, diarrhea, nausea and vomiting.  Musculoskeletal: Negative for back pain and myalgias.  Neurological: Negative for dizziness, light-headedness and headaches.    Objective:   Physical Exam:  BP 130/84  (BP Location: Left Arm, Patient Position: Sitting)   Pulse 82   Temp 97.8 F (36.6 C) (Oral)   Resp 18   Ht 5' (1.524 m)   Wt 166 lb (75.3 kg)   SpO2 99%   BMI 32.42 kg/m  ECOG: 0  Physical Exam Constitutional:      General: She is not in acute distress.    Appearance: She is not diaphoretic.  HENT:     Head: Normocephalic and atraumatic.     Mouth/Throat:     Pharynx: No oropharyngeal exudate.  Eyes:     General: No scleral icterus.       Right eye: No discharge.        Left eye: No discharge.     Conjunctiva/sclera: Conjunctivae normal.  Neck:     Musculoskeletal: Normal range of motion and neck supple.  Cardiovascular:  Rate and Rhythm: Normal rate and regular rhythm.     Heart sounds: Normal heart sounds. No murmur. No friction rub. No gallop.   Pulmonary:     Effort: Pulmonary effort is normal. No respiratory distress.     Breath sounds: Normal breath sounds. No wheezing or rales.  Abdominal:     General: Bowel sounds are normal. There is no distension.     Palpations: Abdomen is soft. There is no mass.     Tenderness: There is no abdominal tenderness. There is no guarding or rebound.  Lymphadenopathy:     Cervical: No cervical adenopathy.  Skin:    General: Skin is warm and dry.     Findings: No erythema or rash.  Neurological:     Mental Status: She is alert.     Coordination: Coordination normal.     Gait: Gait normal.  Psychiatric:        Behavior: Behavior normal.        Thought Content: Thought content normal.        Judgment: Judgment normal.     Lab Review:     Component Value Date/Time   NA 141 05/13/2018 0818   K 3.7 05/13/2018 0818   CL 105 05/13/2018 0818   CO2 26 05/13/2018 0818   GLUCOSE 124 (H) 05/13/2018 0818   BUN 18 05/13/2018 0818   CREATININE 0.68 05/13/2018 0818   CREATININE 0.74 03/27/2018 1512   CALCIUM 9.7 05/13/2018 0818   PROT 7.1 05/13/2018 0818   ALBUMIN 3.5 05/13/2018 0818   AST 25 05/13/2018 0818   ALT 33  05/13/2018 0818   ALKPHOS 90 05/13/2018 0818   BILITOT <0.2 (L) 05/13/2018 0818   GFRNONAA >60 05/13/2018 0818   GFRNONAA 98 10/08/2017 0750   GFRAA >60 05/13/2018 0818   GFRAA 113 10/08/2017 0750       Component Value Date/Time   WBC 15.9 (H) 05/13/2018 0818   WBC 6.4 03/31/2018 0305   RBC 3.21 (L) 05/13/2018 0818   HGB 9.9 (L) 05/13/2018 0818   HCT 29.9 (L) 05/13/2018 0818   PLT 177 05/13/2018 0818   MCV 93.1 05/13/2018 0818   MCH 30.8 05/13/2018 0818   MCHC 33.1 05/13/2018 0818   RDW 22.5 (H) 05/13/2018 0818   LYMPHSABS 1.4 05/13/2018 0818   MONOABS 0.7 05/13/2018 0818   EOSABS 0.0 05/13/2018 0818   BASOSABS 0.0 05/13/2018 0818   -------------------------------  Imaging from last 24 hours (if applicable):  Radiology interpretation: Dg Chest Port 1 View  Result Date: 04/13/2018 CLINICAL DATA:  58 y/o  F; port placement postop. EXAM: PORTABLE CHEST 1 VIEW COMPARISON:  03/19/2018 chest radiograph. 04/02/2018 chest CT. FINDINGS: Interval placement of a port catheter with tip projecting over the lower SVC. Aortic calcific atherosclerosis. Normal cardiac silhouette. Multiple surgical clips project over the right chest wall. Left lung base platelike atelectasis. No consolidation, effusion, or pneumothorax. No acute osseous abnormality is evident. IMPRESSION: Interval placement of a port catheter with tip projecting over the lower SVC. No pneumothorax. Electronically Signed   By: Kristine Garbe M.D.   On: 04/13/2018 14:22   Dg C-arm 1-60 Min-no Report  Result Date: 04/13/2018 Fluoroscopy was utilized by the requesting physician.  No radiographic interpretation.        This case was discussed with Dr. Burr Medico. She expressed agreement with my management of this patient.

## 2018-05-13 NOTE — Progress Notes (Signed)
OK to treat.  Van Tanner, MHS, PA-C 

## 2018-05-14 ENCOUNTER — Other Ambulatory Visit: Payer: Self-pay | Admitting: Hematology

## 2018-05-14 ENCOUNTER — Inpatient Hospital Stay: Payer: 59

## 2018-05-14 VITALS — BP 128/79 | HR 67 | Resp 18

## 2018-05-14 DIAGNOSIS — C162 Malignant neoplasm of body of stomach: Secondary | ICD-10-CM

## 2018-05-14 DIAGNOSIS — Z95828 Presence of other vascular implants and grafts: Secondary | ICD-10-CM

## 2018-05-14 DIAGNOSIS — Z5111 Encounter for antineoplastic chemotherapy: Secondary | ICD-10-CM | POA: Diagnosis not present

## 2018-05-14 DIAGNOSIS — D500 Iron deficiency anemia secondary to blood loss (chronic): Secondary | ICD-10-CM

## 2018-05-14 LAB — CANCER ANTIGEN 19-9: CA 19-9: 90 U/mL — ABNORMAL HIGH (ref 0–35)

## 2018-05-14 MED ORDER — PEGFILGRASTIM-CBQV 6 MG/0.6ML ~~LOC~~ SOSY
6.0000 mg | PREFILLED_SYRINGE | Freq: Once | SUBCUTANEOUS | Status: AC
  Administered 2018-05-14: 6 mg via SUBCUTANEOUS

## 2018-05-14 MED ORDER — SODIUM CHLORIDE 0.9% FLUSH
10.0000 mL | Freq: Once | INTRAVENOUS | Status: AC
  Administered 2018-05-14: 10 mL
  Filled 2018-05-14: qty 10

## 2018-05-14 MED ORDER — PEGFILGRASTIM-CBQV 6 MG/0.6ML ~~LOC~~ SOSY
PREFILLED_SYRINGE | SUBCUTANEOUS | Status: AC
  Filled 2018-05-14: qty 0.6

## 2018-05-14 MED ORDER — HEPARIN SOD (PORK) LOCK FLUSH 100 UNIT/ML IV SOLN
500.0000 [IU] | Freq: Once | INTRAVENOUS | Status: AC
  Administered 2018-05-14: 500 [IU]
  Filled 2018-05-14: qty 5

## 2018-05-14 NOTE — Patient Instructions (Signed)
Pegfilgrastim injection  What is this medicine?  PEGFILGRASTIM (PEG fil gra stim) is a long-acting granulocyte colony-stimulating factor that stimulates the growth of neutrophils, a type of white blood cell important in the body's fight against infection. It is used to reduce the incidence of fever and infection in patients with certain types of cancer who are receiving chemotherapy that affects the bone marrow, and to increase survival after being exposed to high doses of radiation.  This medicine may be used for other purposes; ask your health care provider or pharmacist if you have questions.  COMMON BRAND NAME(S): Fulphila, Neulasta, UDENYCA  What should I tell my health care provider before I take this medicine?  They need to know if you have any of these conditions:  -kidney disease  -latex allergy  -ongoing radiation therapy  -sickle cell disease  -skin reactions to acrylic adhesives (On-Body Injector only)  -an unusual or allergic reaction to pegfilgrastim, filgrastim, other medicines, foods, dyes, or preservatives  -pregnant or trying to get pregnant  -breast-feeding  How should I use this medicine?  This medicine is for injection under the skin. If you get this medicine at home, you will be taught how to prepare and give the pre-filled syringe or how to use the On-body Injector. Refer to the patient Instructions for Use for detailed instructions. Use exactly as directed. Tell your healthcare provider immediately if you suspect that the On-body Injector may not have performed as intended or if you suspect the use of the On-body Injector resulted in a missed or partial dose.  It is important that you put your used needles and syringes in a special sharps container. Do not put them in a trash can. If you do not have a sharps container, call your pharmacist or healthcare provider to get one.  Talk to your pediatrician regarding the use of this medicine in children. While this drug may be prescribed for  selected conditions, precautions do apply.  Overdosage: If you think you have taken too much of this medicine contact a poison control center or emergency room at once.  NOTE: This medicine is only for you. Do not share this medicine with others.  What if I miss a dose?  It is important not to miss your dose. Call your doctor or health care professional if you miss your dose. If you miss a dose due to an On-body Injector failure or leakage, a new dose should be administered as soon as possible using a single prefilled syringe for manual use.  What may interact with this medicine?  Interactions have not been studied.  Give your health care provider a list of all the medicines, herbs, non-prescription drugs, or dietary supplements you use. Also tell them if you smoke, drink alcohol, or use illegal drugs. Some items may interact with your medicine.  This list may not describe all possible interactions. Give your health care provider a list of all the medicines, herbs, non-prescription drugs, or dietary supplements you use. Also tell them if you smoke, drink alcohol, or use illegal drugs. Some items may interact with your medicine.  What should I watch for while using this medicine?  You may need blood work done while you are taking this medicine.  If you are going to need a MRI, CT scan, or other procedure, tell your doctor that you are using this medicine (On-Body Injector only).  What side effects may I notice from receiving this medicine?  Side effects that you should report to   your doctor or health care professional as soon as possible:  -allergic reactions like skin rash, itching or hives, swelling of the face, lips, or tongue  -back pain  -dizziness  -fever  -pain, redness, or irritation at site where injected  -pinpoint red spots on the skin  -red or dark-brown urine  -shortness of breath or breathing problems  -stomach or side pain, or pain at the shoulder  -swelling  -tiredness  -trouble passing urine or  change in the amount of urine  Side effects that usually do not require medical attention (report to your doctor or health care professional if they continue or are bothersome):  -bone pain  -muscle pain  This list may not describe all possible side effects. Call your doctor for medical advice about side effects. You may report side effects to FDA at 1-800-FDA-1088.  Where should I keep my medicine?  Keep out of the reach of children.  If you are using this medicine at home, you will be instructed on how to store it. Throw away any unused medicine after the expiration date on the label.  NOTE: This sheet is a summary. It may not cover all possible information. If you have questions about this medicine, talk to your doctor, pharmacist, or health care provider.   2019 Elsevier/Gold Standard (2017-06-09 16:57:08)

## 2018-05-19 ENCOUNTER — Telehealth: Payer: Self-pay | Admitting: *Deleted

## 2018-05-19 NOTE — Progress Notes (Signed)
Successfully faxed updated FMLA paperwork to [employer] attn: Juluis Rainier at [phone]. Patient needs to be out of work intermittently for 1 year beginning 03/19/18 until 03/20/19.

## 2018-05-19 NOTE — Telephone Encounter (Signed)
Please let pt know we will repeat scan after cycle 4 chemo. Thanks   [name] MD

## 2018-05-19 NOTE — Telephone Encounter (Signed)
Pt called wanting to know when she should have repeated scan.  Stated pt was informed by Dr. Burr Medico that after 3 cycles of chemo, pt will have repeated scan between other cycles.  Pt just wanted to know what the next step of her plan of care. Pt's   Phone    [phone].

## 2018-05-20 ENCOUNTER — Telehealth: Payer: Self-pay | Admitting: *Deleted

## 2018-05-20 NOTE — Telephone Encounter (Signed)
Advised pt that next scans will be after 4th cycle of chemo per Dr Burr Medico.  Pt expressed understanding.

## 2018-05-21 ENCOUNTER — Other Ambulatory Visit: Payer: Self-pay | Admitting: Osteopathic Medicine

## 2018-05-21 ENCOUNTER — Telehealth: Payer: Self-pay | Admitting: Hematology

## 2018-05-21 NOTE — Telephone Encounter (Signed)
Faxed medical records to [facility], Release DL:[phone]

## 2018-05-22 ENCOUNTER — Telehealth: Payer: Self-pay

## 2018-05-22 NOTE — Telephone Encounter (Signed)
Dropped off request for medical records in [place] office. Needed for FMLA

## 2018-05-22 NOTE — Progress Notes (Signed)
[place]   [phone] [phone]   Clinic Follow up Note   Patient Care Team: Emeterio Reeve, DO as PCP - General (Osteopathic Medicine)  Date of Service:  05/27/2018  CHIEF COMPLAINT:  f/u of Gastric Cancer  SUMMARY OF ONCOLOGIC HISTORY: Oncology History   Cancer Staging Gastric cancer [facility]) Staging form: Stomach, AJCC 8th Edition - Clinical stage from 03/29/2018: Stage IVA (cT4b, cN1, cM0) - Signed by [name], MD on 04/03/2018       Gastric cancer ([place])   03/19/2018 Procedure    Upper Endoscopy by Dr. Michail Sermon 03/29/18  IMPRESSION - Normal esophagus. - Z-line regular, 38 cm from the incisors. - Non-obstructing oozing gastric ulcer with pigmented material. Biopsied. - Normal examined duodenum. - Acute gastritis.    03/29/2018 Procedure    Colonoscopy by Dr. Michail Sermon 03/29/18 IMPRESSION - Preparation of the colon was fair. - Internal hemorrhoids. - The examined portion of the ileum was normal. - No specimens collected.    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 - SEE COMMENT. Microscopic Comment A Warthin Starry stain is negative for the presence of Helicobacter pylori organisms. Dr Vic Ripper has reviewed the case and concurs with this interpretation. Dr Michail Sermon was paged on 03/31/2018. (JBK:ecj 03/31/2018)    03/29/2018 Cancer Staging    Staging form: Stomach, AJCC 8th Edition - Clinical stage from 03/29/2018: Stage IVA (cT4b, cN1, cM0) - Signed by [name], MD on 04/03/2018    04/02/2018 Initial Diagnosis    Gastric cancer ([name])    04/02/2018 Imaging    CT CAP W Contrast 04/02/18 IMPRESSION: 1. Focal thickening lateral wall proximal stomach with protrusion of low-attenuation soft tissue beyond the expected confines of the gastric wall into the splenic hilum. Imaging features highly concerning for transmural tumor extension into the splenic  hilum. Several small nodules in this region are likely lymph nodes, concerning for metastatic disease. 2. Scattered bilateral pulmonary nodules measuring up to 5 mm. Nonspecific, but close attention on follow-up recommended as metastatic disease not excluded. 3. Small lymph nodes in the gastrohepatic ligament, but there is no gastrohepatic or hepato duodenal ligament lymphadenopathy. No evidence for liver metastases. 4.  Aortic Atherosclerois (ICD10-170.0    04/15/2018 -  Chemotherapy    FLOT4 every 2 weeks starting 04/15/18      CURRENT THERAPY:  NeoadjuvantFLOT4 every 2 weeks starting 04/15/18  INTERVAL HISTORY:  Mariah Kemp is here for a follow up and treatment. She presents to the clinic today with her family member. She notes she is doing well overall. She notes with chemo she is mostly fatigued and her appetite is low due to taste change. She notes she has been significant cold sensitivity only when she touches cold objects. She denies any other neuropathy. She is drinking 18 ounces a day. She denies any black stool      REVIEW OF SYSTEMS:   Constitutional: Denies fevers, chills or abnormal weight loss (+) Low appetite, taste change (+) Fatigue  Eyes: Denies blurriness of vision Ears, nose, mouth, throat, and face: Denies mucositis or sore throat Respiratory: Denies cough, dyspnea or wheezes Cardiovascular: Denies palpitation, chest discomfort or lower extremity swelling Gastrointestinal:  Denies nausea, heartburn or change in bowel habits Skin: Denies abnormal skin rashes Lymphatics: Denies new lymphadenopathy or easy bruising Neurological:Denies numbness, tingling or new weaknesses (+) Cold sensitivity  Behavioral/Psych: Mood is stable, no new changes  All other systems were reviewed  with the patient and are negative.  MEDICAL HISTORY:  Past Medical History:  Diagnosis Date  . Cancer (Mariah Kemp)   . Hypertension   . Thyroid disease     SURGICAL HISTORY: Past  Surgical History:  Procedure Laterality Date  . ABDOMINAL HYSTERECTOMY  1996  . BILATERAL SALPINGOOPHORECTOMY  2007  . BIOPSY  03/29/2018   Procedure: BIOPSY;  Surgeon: Wilford Corner, MD;  Location: [facility];  Service: Endoscopy;;  . CARPAL TUNNEL RELEASE    . COLONOSCOPY N/A 03/29/2018   Procedure: COLONOSCOPY;  Surgeon: Wilford Corner, MD;  Location: [facility];  Service: Endoscopy;  Laterality: N/A;  . ESOPHAGOGASTRODUODENOSCOPY N/A 03/29/2018   Procedure: ESOPHAGOGASTRODUODENOSCOPY (EGD);  Surgeon: Wilford Corner, MD;  Location: [facility];  Service: Endoscopy;  Laterality: N/A;  . LAPAROSCOPY N/A 04/13/2018   Procedure: LAPAROSCOPY DIAGNOSTIC;  Surgeon: [name], MD;  Location: [facility];  Service: General;  Laterality: N/A;  . MASTECTOMY  2007  . PORTACATH PLACEMENT Left 04/13/2018   Procedure: INSERTION PORT-A-CATH ERAS PATHWAY;  Surgeon: [name], MD;  Location: [facility];  Service: General;  Laterality: Left;  subclavian    I have reviewed the social history and family history with the patient and they are unchanged from previous note.  ALLERGIES:  has No Known Allergies.  MEDICATIONS:  Current Outpatient Medications  Medication Sig Dispense Refill  . acyclovir (ZOVIRAX) 400 MG tablet Take 1 tablet (400 mg total) by mouth 3 (three) times daily. For five days. As needed for cold sore. 15 tablet 0  . albuterol (PROVENTIL HFA;VENTOLIN HFA) 108 (90 Base) MCG/ACT inhaler Inhale 1-2 puffs into the lungs every 4 (four) hours as needed for wheezing or shortness of breath. 2 Inhaler 3  . budesonide-formoterol (SYMBICORT) 160-4.5 MCG/ACT inhaler Inhale 2 puffs into the lungs 2 (two) times daily. (Patient taking differently: Inhale 2 puffs into the lungs 2 (two) times daily as needed (respiratory issues.). ) 2 Inhaler 3  . dexamethasone (DECADRON) 4 MG tablet Take 1 tablet (4 mg total) by mouth 2 (two) times daily with a meal. 30 tablet 2  . docusate sodium (COLACE) 100 MG  capsule Take 100 mg by mouth 2 (two) times daily.    . ferrous sulfate 325 (65 FE) MG tablet Take 1 tablet (325 mg total) by mouth 2 (two) times daily with a meal. 90 tablet 0  . hydrochlorothiazide (HYDRODIURIL) 25 MG tablet TAKE 1 TABLET BY MOUTH EVERY DAY 30 tablet 0  . KLOR-CON M20 20 MEQ tablet TAKE 1 TABLET BY MOUTH 2 TIMES DAILY X 3 DAYS, THEN ONE DAILY THEREAFTER. 36 tablet 1  . levothyroxine (SYNTHROID, LEVOTHROID) 150 MCG tablet Take 1 tablet (150 mcg total) by mouth daily before breakfast. 90 tablet 3  . loperamide (IMODIUM A-D) 2 MG tablet Take 2 mg by mouth 4 (four) times daily as needed for diarrhea or loose stools.    . ondansetron (ZOFRAN) 8 MG tablet Take 1 tablet (8 mg total) by mouth every 8 (eight) hours as needed for nausea or vomiting. 30 tablet 1  . oxyCODONE (OXY IR/ROXICODONE) 5 MG immediate release tablet Take 1 tablet (5 mg total) by mouth every 6 (six) hours as needed for severe pain. 10 tablet 0  . Prenatal Vit-Fe Fumarate-FA (PRENATAL MULTIVITAMIN) TABS tablet Take 1 tablet by mouth daily at 12 noon.    . prochlorperazine (COMPAZINE) 10 MG tablet Take 1 tablet (10 mg total) by mouth every 6 (six) hours as needed for nausea or vomiting. 30 tablet 2  .  vitamin B-12 1000 MCG tablet Take 1 tablet (1,000 mcg total) by mouth daily. 30 tablet 0  . pantoprazole (PROTONIX) 40 MG tablet Take 1 tablet (40 mg total) by mouth 2 (two) times daily before a meal for 30 days. 60 tablet 3   No current facility-administered medications for this visit.    Facility-Administered Medications Ordered in Other Visits  Medication Dose Route Frequency Provider Last Rate Last Dose  . 0.9 %  sodium chloride infusion   Intravenous Continuous [name], MD 20 mL/hr at 05/27/18 0901    . dextrose 5 % solution   Intravenous Continuous [name], MD 20 mL/hr at 05/27/18 1057    . fluorouracil (ADRUCIL) 4,650 mg in sodium chloride 0.9 % 57 mL chemo infusion  2,600 mg/m2 (Treatment Plan Recorded)  Intravenous 1 day or 1 dose [name], MD      . leucovorin 358 mg in dextrose 5 % 250 mL infusion  200 mg/m2 (Treatment Plan Recorded) Intravenous Once [name], MD      . oxaliplatin (ELOXATIN) 150 mg in dextrose 5 % 500 mL chemo infusion  85 mg/m2 (Treatment Plan Recorded) Intravenous Once [name], MD        PHYSICAL EXAMINATION: ECOG PERFORMANCE STATUS: 1 - Symptomatic but completely ambulatory  Vitals:   05/27/18 0828  BP: 127/72  Pulse: 76  Resp: 18  Temp: 98 F (36.7 C)  SpO2: 96%   Filed Weights   05/27/18 0828  Weight: 164 lb (74.4 kg)    GENERAL:alert, no distress and comfortable SKIN: skin color, texture, turgor are normal, no rashes or significant lesions EYES: normal, Conjunctiva are pink and non-injected, sclera clear OROPHARYNX:no exudate, no erythema and lips, buccal mucosa, and tongue normal  NECK: supple, thyroid normal size, non-tender, without nodularity LYMPH:  no palpable lymphadenopathy in the cervical, axillary or inguinal LUNGS: clear to auscultation and percussion with normal breathing effort HEART: regular rate & rhythm and no murmurs and no lower extremity edema ABDOMEN:abdomen soft, non-tender and normal bowel sounds Musculoskeletal:no cyanosis of digits and no clubbing  NEURO: alert & oriented x 3 with fluent speech, no focal motor/sensory deficits  LABORATORY DATA:  I have reviewed the data as listed CBC Latest Ref Rng & Units 05/27/2018 05/13/2018 05/06/2018  WBC 4.0 - 10.5 K/uL 11.6(H) 15.9(H) 12.9(H)  Hemoglobin 12.0 - 15.0 g/dL 9.6(L) 9.9(L) 9.1(L)  Hematocrit 36.0 - 46.0 % 29.4(L) 29.9(L) 27.9(L)  Platelets 150 - 400 K/uL 125(L) 177 177     CMP Latest Ref Rng & Units 05/27/2018 05/13/2018 05/06/2018  Glucose 70 - 99 mg/dL 175(H) 124(H) 103(H)  BUN 6 - 20 mg/dL 14 18 12   Creatinine 0.44 - 1.00 mg/dL 0.78 0.68 0.70  Sodium 135 - 145 mmol/L 140 141 139  Potassium 3.5 - 5.1 mmol/L 4.3 3.7 3.6  Chloride 98 - 111 mmol/L 105 105 105  CO2 22  - 32 mmol/L 24 26 25   Calcium 8.9 - 10.3 mg/dL 9.3 9.7 8.5(L)  Total Protein 6.5 - 8.1 g/dL 7.0 7.1 7.1  Total Bilirubin 0.3 - 1.2 mg/dL 0.3 <0.2(L) <0.2(L)  Alkaline Phos 38 - 126 U/L 97 90 113  AST 15 - 41 U/L 26 25 27   ALT 0 - 44 U/L 32 33 36      RADIOGRAPHIC STUDIES: I have personally reviewed the radiological images as listed and agreed with the findings in the report. No results found.   ASSESSMENT & PLAN:  Mariah Kemp is a 58 y.o. female  with   1. 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 -She has seen surgeon Dr. Barry Dienes, and underwent exploratory laparoscope which was negative for peritoneal metastasis. -Given her locally advanced disease, she started neo-adjuvant chemoFLOT4 every 2 weeks for 4 monthson 04/15/18. She is tolerating moderately well with fatigue, low fluid and calorie intake, taste change and cold sensitivity.  -She will continue Protonix, I refilled today  -Labs reviewed, WBC at 11.6, Hg at 9.6, plt at 125K, ANC at 9.9. CMP WNL except blood glucose 175. Ferritin is still pending. Will watch plt level, if continues to drop significantly will reduce chemo dose. I advised her to avoid injury given her risk for bleeding.  -CA 19.9 has decreased significantly since she started chemotherapy, clinically indicating her tumor is responding to chemo well.  -Will do CT AP before next visit fore restaging  -F/u in 2 weeks   2. Anemia, iron deficient from chronic GI bleeding. -Secondary to GI blood loss  -Given 3u blood transfusion on 04/01/18, responded moderately well.  -She is s/p 2 doses of IV iron in 03/2018, she responded moderately well with improved anemia. -For Hg <8 we will give blood transfusion.  -Hemoglobin improved to 9.6 today (05/27/18).  She is not symptomatic, will continue monitoring  3. H/o stage 3 right breast cancer in 2007 -Treated with right mastectomy, reconstruction  with 3 LN removed. She underwent chemotherapy for 5 months, likely AC-T. She completed Tamoxifen for at least 5 years.   4. Genetic  -I discussed her eligibility for genetic testing. She has personal history of breast and gastric cancer. She was previously referred   5. Hypokalemia -She will continue potassium chloride 20 meq daily  -K at 4.3 today (05/27/18)  6. HTN and hypothyroidism -We will hold hydrochlorothiazide for now during the chemo -HTN well controlled  -Continue to follow-up with PCP  PLAN: -I will refill Protonix today  -Lab reviewed, adequate for treatment, will proceed to cycle 4 FLOT4 today at same dose  -Lab, flush, f/u and FLOT4 in 2 weeks  -CT abd/pel w contrast in 1-2 weeks    No problem-specific Assessment & Plan notes found for this encounter.   Orders Placed This Encounter  Procedures  . CT Abdomen Pelvis W Contrast    Post neoadjuvant chemo 2 months    Standing Status:   Future    Standing Expiration Date:   05/27/2019    Order Specific Question:   If indicated for the ordered procedure, I authorize the administration of contrast media per Radiology protocol    Answer:   Yes    Order Specific Question:   Is patient pregnant?    Answer:   No    Order Specific Question:   Preferred imaging location?    Answer:   [facility]    Order Specific Question:   Is Oral Contrast requested for this exam?    Answer:   Yes, Per Radiology protocol    Order Specific Question:   Radiology Contrast Protocol - do NOT remove file path    Answer:   [email or web address]   All questions were answered. The patient knows to call the clinic with any problems, questions or concerns. No barriers to learning was detected. I spent 20 minutes counseling the patient face to face. The total time spent in the appointment was 25 minutes and more than 50% was on counseling and review of test results  [name], MD 05/27/2018   I, Joslyn Devon, am acting as scribe for [name], MD.   I have reviewed the above documentation for accuracy and completeness, and I agree with the above.

## 2018-05-26 NOTE — Progress Notes (Signed)
Updated FMLA to continuous leave of absence from 03/19/18 - 09/17/18 (6 months). Successfully faxed forms to [employer] at [phone].

## 2018-05-27 ENCOUNTER — Encounter: Payer: Self-pay | Admitting: Hematology

## 2018-05-27 ENCOUNTER — Inpatient Hospital Stay: Payer: 59

## 2018-05-27 ENCOUNTER — Inpatient Hospital Stay: Payer: 59 | Attending: Hematology

## 2018-05-27 ENCOUNTER — Other Ambulatory Visit: Payer: Self-pay

## 2018-05-27 ENCOUNTER — Inpatient Hospital Stay (HOSPITAL_BASED_OUTPATIENT_CLINIC_OR_DEPARTMENT_OTHER): Payer: 59 | Admitting: Hematology

## 2018-05-27 ENCOUNTER — Telehealth: Payer: Self-pay | Admitting: Hematology

## 2018-05-27 VITALS — BP 127/72 | HR 76 | Temp 98.0°F | Resp 18 | Ht 60.0 in | Wt 164.0 lb

## 2018-05-27 DIAGNOSIS — E039 Hypothyroidism, unspecified: Secondary | ICD-10-CM

## 2018-05-27 DIAGNOSIS — E876 Hypokalemia: Secondary | ICD-10-CM

## 2018-05-27 DIAGNOSIS — D500 Iron deficiency anemia secondary to blood loss (chronic): Secondary | ICD-10-CM

## 2018-05-27 DIAGNOSIS — I10 Essential (primary) hypertension: Secondary | ICD-10-CM

## 2018-05-27 DIAGNOSIS — C169 Malignant neoplasm of stomach, unspecified: Secondary | ICD-10-CM

## 2018-05-27 DIAGNOSIS — K922 Gastrointestinal hemorrhage, unspecified: Secondary | ICD-10-CM | POA: Diagnosis not present

## 2018-05-27 DIAGNOSIS — C162 Malignant neoplasm of body of stomach: Secondary | ICD-10-CM

## 2018-05-27 DIAGNOSIS — Z5111 Encounter for antineoplastic chemotherapy: Secondary | ICD-10-CM | POA: Insufficient documentation

## 2018-05-27 DIAGNOSIS — Z5189 Encounter for other specified aftercare: Secondary | ICD-10-CM | POA: Insufficient documentation

## 2018-05-27 DIAGNOSIS — Z853 Personal history of malignant neoplasm of breast: Secondary | ICD-10-CM

## 2018-05-27 DIAGNOSIS — Z95828 Presence of other vascular implants and grafts: Secondary | ICD-10-CM

## 2018-05-27 LAB — CBC WITH DIFFERENTIAL (CANCER CENTER ONLY)
Abs Immature Granulocytes: 0.33 10*3/uL — ABNORMAL HIGH (ref 0.00–0.07)
Basophils Absolute: 0 10*3/uL (ref 0.0–0.1)
Basophils Relative: 0 %
EOS PCT: 0 %
Eosinophils Absolute: 0 10*3/uL (ref 0.0–0.5)
HCT: 29.4 % — ABNORMAL LOW (ref 36.0–46.0)
HEMOGLOBIN: 9.6 g/dL — AB (ref 12.0–15.0)
Immature Granulocytes: 3 %
Lymphocytes Relative: 9 %
Lymphs Abs: 1.1 10*3/uL (ref 0.7–4.0)
MCH: 31.7 pg (ref 26.0–34.0)
MCHC: 32.7 g/dL (ref 30.0–36.0)
MCV: 97 fL (ref 80.0–100.0)
Monocytes Absolute: 0.2 10*3/uL (ref 0.1–1.0)
Monocytes Relative: 2 %
Neutro Abs: 9.9 10*3/uL — ABNORMAL HIGH (ref 1.7–7.7)
Neutrophils Relative %: 86 %
Platelet Count: 125 10*3/uL — ABNORMAL LOW (ref 150–400)
RBC: 3.03 MIL/uL — ABNORMAL LOW (ref 3.87–5.11)
RDW: 20.9 % — ABNORMAL HIGH (ref 11.5–15.5)
WBC Count: 11.6 10*3/uL — ABNORMAL HIGH (ref 4.0–10.5)
nRBC: 0 % (ref 0.0–0.2)

## 2018-05-27 LAB — CMP (CANCER CENTER ONLY)
ALT: 32 U/L (ref 0–44)
AST: 26 U/L (ref 15–41)
Albumin: 3.4 g/dL — ABNORMAL LOW (ref 3.5–5.0)
Alkaline Phosphatase: 97 U/L (ref 38–126)
Anion gap: 11 (ref 5–15)
BUN: 14 mg/dL (ref 6–20)
CO2: 24 mmol/L (ref 22–32)
Calcium: 9.3 mg/dL (ref 8.9–10.3)
Chloride: 105 mmol/L (ref 98–111)
Creatinine: 0.78 mg/dL (ref 0.44–1.00)
GFR, Est AFR Am: >60 mL/min (ref >60)
GFR, Estimated: >60 mL/min (ref >60)
Glucose, Bld: 175 mg/dL — ABNORMAL HIGH (ref 70–99)
Potassium: 4.3 mmol/L (ref 3.5–5.1)
Sodium: 140 mmol/L (ref 135–145)
Total Bilirubin: 0.3 mg/dL (ref 0.3–1.2)
Total Protein: 7 g/dL (ref 6.5–8.1)

## 2018-05-27 LAB — SAMPLE TO BLOOD BANK

## 2018-05-27 LAB — FERRITIN: Ferritin: 866 ng/mL — ABNORMAL HIGH (ref 11–307)

## 2018-05-27 MED ORDER — PALONOSETRON HCL INJECTION 0.25 MG/5ML
0.2500 mg | Freq: Once | INTRAVENOUS | Status: AC
  Administered 2018-05-27: 0.25 mg via INTRAVENOUS

## 2018-05-27 MED ORDER — SODIUM CHLORIDE 0.9 % IV SOLN
50.0000 mg/m2 | Freq: Once | INTRAVENOUS | Status: AC
  Administered 2018-05-27: 90 mg via INTRAVENOUS
  Filled 2018-05-27: qty 9

## 2018-05-27 MED ORDER — LEUCOVORIN CALCIUM INJECTION 350 MG
200.0000 mg/m2 | Freq: Once | INTRAVENOUS | Status: AC
  Administered 2018-05-27: 358 mg via INTRAVENOUS
  Filled 2018-05-27: qty 17.9

## 2018-05-27 MED ORDER — SODIUM CHLORIDE 0.9% FLUSH
10.0000 mL | Freq: Once | INTRAVENOUS | Status: AC
  Administered 2018-05-27: 10 mL
  Filled 2018-05-27: qty 10

## 2018-05-27 MED ORDER — SODIUM CHLORIDE 0.9 % IV SOLN
INTRAVENOUS | Status: DC
  Administered 2018-05-27: 09:00:00 via INTRAVENOUS
  Filled 2018-05-27: qty 250

## 2018-05-27 MED ORDER — DEXAMETHASONE SODIUM PHOSPHATE 10 MG/ML IJ SOLN
10.0000 mg | Freq: Once | INTRAMUSCULAR | Status: AC
  Administered 2018-05-27: 10 mg via INTRAVENOUS

## 2018-05-27 MED ORDER — DEXAMETHASONE SODIUM PHOSPHATE 10 MG/ML IJ SOLN
INTRAMUSCULAR | Status: AC
  Filled 2018-05-27: qty 1

## 2018-05-27 MED ORDER — DEXTROSE 5 % IV SOLN
INTRAVENOUS | Status: DC
  Administered 2018-05-27: 11:00:00 via INTRAVENOUS
  Filled 2018-05-27: qty 250

## 2018-05-27 MED ORDER — PALONOSETRON HCL INJECTION 0.25 MG/5ML
INTRAVENOUS | Status: AC
  Filled 2018-05-27: qty 5

## 2018-05-27 MED ORDER — DEXTROSE 5 % IV SOLN
Freq: Once | INTRAVENOUS | Status: AC
  Administered 2018-05-27: 11:00:00 via INTRAVENOUS
  Filled 2018-05-27: qty 250

## 2018-05-27 MED ORDER — PANTOPRAZOLE SODIUM 40 MG PO TBEC: 40.0000 mg | DELAYED_RELEASE_TABLET | Freq: Two times a day (BID) | ORAL | 3 refills | Status: DC

## 2018-05-27 MED ORDER — SODIUM CHLORIDE 0.9 % IV SOLN
2600.0000 mg/m2 | INTRAVENOUS | Status: DC
  Administered 2018-05-27: 4650 mg via INTRAVENOUS
  Filled 2018-05-27: qty 93

## 2018-05-27 MED ORDER — OXALIPLATIN CHEMO INJECTION 100 MG/20ML
85.0000 mg/m2 | Freq: Once | INTRAVENOUS | Status: AC
  Administered 2018-05-27: 150 mg via INTRAVENOUS
  Filled 2018-05-27: qty 10

## 2018-05-27 NOTE — Telephone Encounter (Signed)
Called patient and scheduled appt per 03/11 los.  Let the patient know that I had to reschedule her appt from 3/13 to 3/12 per infusion nurse.  Stated she only needed 24 hours after her treatment for her pump stop.  Patient aware of her appt and will stop by scheduling tomorrow to get her appt calendar.

## 2018-05-28 ENCOUNTER — Inpatient Hospital Stay: Payer: 59

## 2018-05-28 VITALS — BP 122/65 | HR 69 | Temp 98.1°F | Resp 18

## 2018-05-28 DIAGNOSIS — Z5111 Encounter for antineoplastic chemotherapy: Secondary | ICD-10-CM | POA: Diagnosis not present

## 2018-05-28 DIAGNOSIS — C162 Malignant neoplasm of body of stomach: Secondary | ICD-10-CM

## 2018-05-28 MED ORDER — HEPARIN SOD (PORK) LOCK FLUSH 100 UNIT/ML IV SOLN
500.0000 [IU] | Freq: Once | INTRAVENOUS | Status: AC | PRN
  Administered 2018-05-28: 500 [IU]
  Filled 2018-05-28: qty 5

## 2018-05-28 MED ORDER — SODIUM CHLORIDE 0.9% FLUSH
10.0000 mL | INTRAVENOUS | Status: DC | PRN
  Administered 2018-05-28: 10 mL
  Filled 2018-05-28: qty 10

## 2018-05-28 MED ORDER — PEGFILGRASTIM-CBQV 6 MG/0.6ML ~~LOC~~ SOSY
PREFILLED_SYRINGE | SUBCUTANEOUS | Status: AC
  Filled 2018-05-28: qty 0.6

## 2018-05-28 MED ORDER — PEGFILGRASTIM-CBQV 6 MG/0.6ML ~~LOC~~ SOSY
6.0000 mg | PREFILLED_SYRINGE | Freq: Once | SUBCUTANEOUS | Status: AC
  Administered 2018-05-28: 6 mg via SUBCUTANEOUS

## 2018-06-03 ENCOUNTER — Other Ambulatory Visit: Payer: Self-pay

## 2018-06-03 MED ORDER — HYDROCHLOROTHIAZIDE 25 MG PO TABS: 25.0000 mg | ORAL_TABLET | Freq: Every day | ORAL | 0 refills | Status: DC

## 2018-06-05 NOTE — Progress Notes (Signed)
[place]   [phone] [phone]   Clinic Follow up Note   Patient Care Team: Emeterio Reeve, DO as PCP - General (Osteopathic Medicine)  Date of Service:  06/10/2018  CHIEF COMPLAINT: f/u of Gastric Cancer  SUMMARY OF ONCOLOGIC HISTORY: Oncology History   Cancer Staging Gastric cancer [facility]) Staging form: Stomach, AJCC 8th Edition - Clinical stage from 03/29/2018: Stage IVA (cT4b, cN1, cM0) - Signed by [name], MD on 04/03/2018       Gastric cancer ([place])   03/19/2018 Procedure    Upper Endoscopy by Dr. Michail Sermon 03/29/18  IMPRESSION - Normal esophagus. - Z-line regular, 38 cm from the incisors. - Non-obstructing oozing gastric ulcer with pigmented material. Biopsied. - Normal examined duodenum. - Acute gastritis.    03/29/2018 Procedure    Colonoscopy by Dr. Michail Sermon 03/29/18 IMPRESSION - Preparation of the colon was fair. - Internal hemorrhoids. - The examined portion of the ileum was normal. - No specimens collected.    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 - SEE COMMENT. Microscopic Comment A Warthin Starry stain is negative for the presence of Helicobacter pylori organisms. Dr Vic Ripper has reviewed the case and concurs with this interpretation. Dr Michail Sermon was paged on 03/31/2018. (JBK:ecj 03/31/2018)    03/29/2018 Cancer Staging    Staging form: Stomach, AJCC 8th Edition - Clinical stage from 03/29/2018: Stage IVA (cT4b, cN1, cM0) - Signed by [name], MD on 04/03/2018    04/02/2018 Initial Diagnosis    Gastric cancer ([place])    04/02/2018 Imaging    CT CAP W Contrast 04/02/18 IMPRESSION: 1. Focal thickening lateral wall proximal stomach with protrusion of low-attenuation soft tissue beyond the expected confines of the gastric wall into the splenic hilum. Imaging features highly concerning for transmural tumor extension into the splenic  hilum. Several small nodules in this region are likely lymph nodes, concerning for metastatic disease. 2. Scattered bilateral pulmonary nodules measuring up to 5 mm. Nonspecific, but close attention on follow-up recommended as metastatic disease not excluded. 3. Small lymph nodes in the gastrohepatic ligament, but there is no gastrohepatic or hepato duodenal ligament lymphadenopathy. No evidence for liver metastases. 4.  Aortic Atherosclerois (ICD10-170.0    04/15/2018 -  Chemotherapy    FLOT4 every 2 weeks starting 04/15/18      CURRENT THERAPY:  NeoadjuvantFLOT4 every 2 weeks starting 04/15/18. Plans to complete 07/22/18  INTERVAL HISTORY:  Zada Haser is here for a follow up and treatment. She presents to the clinic today by herself. She notes last cycle chemo was harder on her appetite due to taste change. It made it hard for her to taste carbs. She has lost weight. She is able to eat without nausea/vomiting. She has had increase in diarrhea. She had it every morning for a few hours (4-5 times) and improve through the day. After the first 1-2 BM she takes Imodium twice and then improves. She drinks about 16 ounces a day. She has tried many types of liquids. She has been drinking ensure boost with other foods, about 1 bottle a day. She noes having upper GI gas.     REVIEW OF SYSTEMS:   Constitutional: Denies fevers, chills or abnormal weight loss (+) Taste change and lower appetite. (+) Weight loss  Eyes: Denies blurriness of vision Ears, nose, mouth, throat, and face: Denies mucositis or sore throat Respiratory: Denies cough, dyspnea or wheezes Cardiovascular: Denies palpitation, chest discomfort  or lower extremity swelling Gastrointestinal:  Denies nausea, heartburn (+) diarrhea (+) upper GI gas Skin: Denies abnormal skin rashes Lymphatics: Denies new lymphadenopathy or easy bruising Neurological:Denies numbness, tingling or new weaknesses Behavioral/Psych: Mood is stable, no  new changes  All other systems were reviewed with the patient and are negative.  MEDICAL HISTORY:  Past Medical History:  Diagnosis Date   Cancer [facility])    Hypertension    Thyroid disease     SURGICAL HISTORY: Past Surgical History:  Procedure Laterality Date   ABDOMINAL HYSTERECTOMY  1996   BILATERAL SALPINGOOPHORECTOMY  2007   BIOPSY  03/29/2018   Procedure: BIOPSY;  Surgeon: Wilford Corner, MD;  Location: [place];  Service: Endoscopy;;   CARPAL TUNNEL RELEASE     COLONOSCOPY N/A 03/29/2018   Procedure: COLONOSCOPY;  Surgeon: Wilford Corner, MD;  Location: [place];  Service: Endoscopy;  Laterality: N/A;   ESOPHAGOGASTRODUODENOSCOPY N/A 03/29/2018   Procedure: ESOPHAGOGASTRODUODENOSCOPY (EGD);  Surgeon: Wilford Corner, MD;  Location: [place];  Service: Endoscopy;  Laterality: N/A;   LAPAROSCOPY N/A 04/13/2018   Procedure: LAPAROSCOPY DIAGNOSTIC;  Surgeon: [name], MD;  Location: [facility];  Service: General;  Laterality: N/A;   MASTECTOMY  2007   PORTACATH PLACEMENT Left 04/13/2018   Procedure: INSERTION PORT-A-CATH ERAS PATHWAY;  Surgeon: [name], MD;  Location: [facility];  Service: General;  Laterality: Left;  subclavian    I have reviewed the social history and family history with the patient and they are unchanged from previous note.  ALLERGIES:  has No Known Allergies.  MEDICATIONS:  Current Outpatient Medications  Medication Sig Dispense Refill   acyclovir (ZOVIRAX) 400 MG tablet Take 1 tablet (400 mg total) by mouth 3 (three) times daily. For five days. As needed for cold sore. 15 tablet 0   albuterol (PROVENTIL HFA;VENTOLIN HFA) 108 (90 Base) MCG/ACT inhaler Inhale 1-2 puffs into the lungs every 4 (four) hours as needed for wheezing or shortness of breath. 2 Inhaler 3   budesonide-formoterol (SYMBICORT) 160-4.5 MCG/ACT inhaler Inhale 2 puffs into the lungs 2 (two) times daily. (Patient taking differently: Inhale 2 puffs into the  lungs 2 (two) times daily as needed (respiratory issues.). ) 2 Inhaler 3   dexamethasone (DECADRON) 4 MG tablet Take 1 tablet (4 mg total) by mouth 2 (two) times daily with a meal. 30 tablet 2   docusate sodium (COLACE) 100 MG capsule Take 100 mg by mouth 2 (two) times daily.     ferrous sulfate 325 (65 FE) MG tablet Take 1 tablet (325 mg total) by mouth 2 (two) times daily with a meal. 90 tablet 0   hydrochlorothiazide (HYDRODIURIL) 25 MG tablet Take 1 tablet (25 mg total) by mouth daily. 30 tablet 0   KLOR-CON M20 20 MEQ tablet TAKE 1 TABLET BY MOUTH 2 TIMES DAILY X 3 DAYS, THEN ONE DAILY THEREAFTER. 36 tablet 1   levothyroxine (SYNTHROID, LEVOTHROID) 150 MCG tablet Take 1 tablet (150 mcg total) by mouth daily before breakfast. 90 tablet 3   loperamide (IMODIUM A-D) 2 MG tablet Take 2 mg by mouth 4 (four) times daily as needed for diarrhea or loose stools.     ondansetron (ZOFRAN) 8 MG tablet Take 1 tablet (8 mg total) by mouth every 8 (eight) hours as needed for nausea or vomiting. 30 tablet 1   oxyCODONE (OXY IR/ROXICODONE) 5 MG immediate release tablet Take 1 tablet (5 mg total) by mouth every 6 (six) hours as needed for severe pain. 10 tablet 0  pantoprazole (PROTONIX) 40 MG tablet Take 1 tablet (40 mg total) by mouth daily for 30 days. 30 tablet 3   Prenatal Vit-Fe Fumarate-FA (PRENATAL MULTIVITAMIN) TABS tablet Take 1 tablet by mouth daily at 12 noon.     prochlorperazine (COMPAZINE) 10 MG tablet Take 1 tablet (10 mg total) by mouth every 6 (six) hours as needed for nausea or vomiting. 30 tablet 2   vitamin B-12 1000 MCG tablet Take 1 tablet (1,000 mcg total) by mouth daily. 30 tablet 0   No current facility-administered medications for this visit.    Facility-Administered Medications Ordered in Other Visits  Medication Dose Route Frequency Provider Last Rate Last Dose   0.9 %  sodium chloride infusion   Intravenous Continuous [name], MD   Stopped at 06/10/18 1201    dextrose 5 % solution   Intravenous Continuous [name], MD   Stopped at 06/10/18 1415   fluorouracil (ADRUCIL) 4,650 mg in sodium chloride 0.9 % 57 mL chemo infusion  2,600 mg/m2 (Treatment Plan Recorded) Intravenous 1 day or 1 dose [name], MD   4,650 mg at 06/10/18 1416   sodium chloride flush (NS) 0.9 % injection 10 mL  10 mL Intracatheter PRN [name], MD        PHYSICAL EXAMINATION: ECOG PERFORMANCE STATUS: 1 - Symptomatic but completely ambulatory  Vitals:   06/10/18 0857  BP: 128/78  Pulse: 77  Resp: 18  Temp: 97.7 F (36.5 C)  SpO2: 99%   Filed Weights   06/10/18 0857  Weight: 156 lb 11.2 oz (71.1 kg)    GENERAL:alert, no distress and comfortable SKIN: skin color, texture, turgor are normal, no rashes or significant lesions LUNGS: clear to auscultation and percussion with normal breathing effort HEART: regular rate & rhythm and no murmurs and no lower extremity edema ABDOMEN:abdomen soft, non-tender and normal bowel sounds Musculoskeletal:no cyanosis of digits and no clubbing  NEURO: alert & oriented x 3 with fluent speech, no focal motor/sensory deficits  LABORATORY DATA:  I have reviewed the data as listed CBC Latest Ref Rng & Units 06/10/2018 05/27/2018 05/13/2018  WBC 4.0 - 10.5 K/uL 5.4 11.6(H) 15.9(H)  Hemoglobin 12.0 - 15.0 g/dL 11.3(L) 9.6(L) 9.9(L)  Hematocrit 36.0 - 46.0 % 34.0(L) 29.4(L) 29.9(L)  Platelets 150 - 400 K/uL 249 125(L) 177     CMP Latest Ref Rng & Units 06/10/2018 05/27/2018 05/13/2018  Glucose 70 - 99 mg/dL 97 175(H) 124(H)  BUN 6 - 20 mg/dL 8 14 18   Creatinine 0.44 - 1.00 mg/dL 0.72 0.78 0.68  Sodium 135 - 145 mmol/L 140 140 141  Potassium 3.5 - 5.1 mmol/L 3.8 4.3 3.7  Chloride 98 - 111 mmol/L 103 105 105  CO2 22 - 32 mmol/L 27 24 26   Calcium 8.9 - 10.3 mg/dL 9.1 9.3 9.7  Total Protein 6.5 - 8.1 g/dL 7.5 7.0 7.1  Total Bilirubin 0.3 - 1.2 mg/dL 0.3 0.3 <0.2(L)  Alkaline Phos 38 - 126 U/L 80 97 90  AST 15 - 41 U/L 32 26 25  ALT 0  - 44 U/L 28 32 33      RADIOGRAPHIC STUDIES: I have personally reviewed the radiological images as listed and agreed with the findings in the report. Ct Abdomen Pelvis W Contrast  Result Date: 06/09/2018 CLINICAL DATA:  Follow-up gastric adenocarcinoma. Undergoing neoadjuvant chemotherapy. Personal history of right breast carcinoma. EXAM: CT ABDOMEN AND PELVIS WITH CONTRAST TECHNIQUE: Multidetector CT imaging of the abdomen and pelvis was performed using the standard  protocol following bolus administration of intravenous contrast. CONTRAST:  133mL OMNIPAQUE IOHEXOL 300 MG/ML  SOLN COMPARISON:  04/02/2018 FINDINGS: Lower chest: No acute findings. Hepatobiliary: No hepatic masses identified. Stable moderate hepatic steatosis. Gallbladder is unremarkable. Pancreas:  No mass or inflammatory changes. Spleen:  Within normal limits in size and appearance. Adrenals/Urinary Tract: No masses identified. No evidence of hydronephrosis. Stomach/Bowel: There is mild residual wall thickening along the posterolateral wall of the proximal stomach, which is nearly completely resolved since previous study. Mild diverticulosis is seen involving the descending and proximal sigmoid colon, however there is no evidence of diverticulitis. Vascular/Lymphatic: No pathologically enlarged lymph nodes identified. No abdominal aortic aneurysm. Aortic atherosclerosis. Reproductive: Prior hysterectomy noted. Adnexal regions are unremarkable in appearance. Other:  None. Musculoskeletal:  No suspicious bone lesions identified. IMPRESSION: 1. Mild residual wall thickening involving the posterolateral wall of the gastric fundus, which has nearly completely resolved since previous study. 2. No evidence of metastatic disease or other acute findings within the abdomen or pelvis. 3. Stable hepatic steatosis. 4. Colonic diverticulosis. No radiographic evidence of diverticulitis. Electronically Signed   By: Earle Gell M.D.   On: 06/09/2018 15:52      ASSESSMENT & PLAN:  Mariah Kemp is a 58 y.o. female with   1. 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 -She has seen surgeon Dr. Barry Dienes, and underwent exploratory laparoscope which was negative for peritoneal metastasis. -Given her locally advanced disease, she started neo-adjuvant chemoFLOT4 every 2 weeks for 4 monthson 04/15/18. She is tolerating moderately well.  -She will continue Protonix.  -She continues to have harder time recovering after her last treatment with increased diarrhea, and lower fluid and calorie intake, taste change, and further weight loss, although her PS remains to be 1  -We discussed her CT AP from 06/09/18 which shows good response with decrease the wall thickness of the stomach, and no evidence of metastatic disease. Will continue current treatment at current dose.  Certainly if she develops worsening side effects, will consider dose reduction. -Labs reviewed, CBC and CMP WNL except mild anemia, albumin 3.3. Ferritin and CA 19.9 still pending. Overall adequate to proceed with treatment cycle 5 today.  Total of 8 cycles is planned for total neoadjuvant chemotherapy. -F/u in 2 weeks for cycle 6.  2. Anemia, iron deficient from chronic GI bleeding. -Secondary to GI blood loss  -Given 3u blood transfusion on 04/01/18, responded moderately well.  -She is s/p 2 doses of IV iron in 03/2018, she responded moderately wellwith improved anemia. -Hemoglobin improved to 11.3 today (06/10/18), Ferritin still pending. She is not symptomatic, will continue monitoring  3. H/o stage 3 right breast cancer in 2007 -Treated with right mastectomy, reconstruction with 3 LN removed. She underwent chemotherapy for 5 months, likely AC-T. She completed Tamoxifen for at least 5 years.   4. Genetic  -I discussed her eligibility for genetic testing. She has personal history of breast and gastric cancer. She was  previously referred   5. Hypokalemia -She will continue potassium chloride 20 meq daily  -K at 3.8 today (06/10/18)   6. HTN andhypothyroidism -We will hold hydrochlorothiazide for now during the chemo -HTN well controlled  -Continue to follow-up with PCP  7. Diarrhea, Low appetite, weight loss  -She has had poor appetite with 4th cycle due to taste change resulting in continued weight loss -I strongly encouraged her to increase calorie in diet, increase to 3 ensure a day and increase  liquid intake.  -She also has had an increase in diarrhea. Moderately controlled with 2 imodium at first loose BM. She is fine to increase imodium if needed.  -continue to Follow up with Warnell Forester.   PLAN: -I will refill Protonix today  -I reviewed CT AP with patient which shows excellent response to treatment.  -Lab reviewed, adequate for treatment, will proceed to cycle 5FLOT4 today at full dose  -Lab, flush, f/u and FLOT4 in 2 weeks      No problem-specific Assessment & Plan notes found for this encounter.   No orders of the defined types were placed in this encounter.  All questions were answered. The patient knows to call the clinic with any problems, questions or concerns. No barriers to learning was detected. I spent 20 minutes counseling the patient face to face. The total time spent in the appointment was 25 minutes and more than 50% was on counseling and review of test results     [name], MD 06/10/2018   I, Joslyn Devon, am acting as scribe for [name], MD.   I have reviewed the above documentation for accuracy and completeness, and I agree with the above.

## 2018-06-08 ENCOUNTER — Telehealth: Payer: Self-pay | Admitting: Hematology

## 2018-06-08 NOTE — Telephone Encounter (Signed)
Faxed medical records to [employer]. Release ZQ#[id number]

## 2018-06-09 ENCOUNTER — Other Ambulatory Visit: Payer: Self-pay

## 2018-06-09 ENCOUNTER — Ambulatory Visit (HOSPITAL_COMMUNITY)
Admission: RE | Admit: 2018-06-09 | Discharge: 2018-06-09 | Disposition: A | Discharge status: Home / Self Care | Payer: 59 | Source: Ambulatory Visit | Attending: Hematology | Admitting: Hematology

## 2018-06-09 DIAGNOSIS — C162 Malignant neoplasm of body of stomach: Secondary | ICD-10-CM | POA: Diagnosis not present

## 2018-06-09 MED ORDER — SODIUM CHLORIDE (PF) 0.9 % IJ SOLN
INTRAMUSCULAR | Status: AC
  Filled 2018-06-09: qty 50

## 2018-06-09 MED ORDER — IOHEXOL 300 MG/ML  SOLN
100.0000 mL | Freq: Once | INTRAMUSCULAR | Status: AC | PRN
  Administered 2018-06-09: 100 mL via INTRAVENOUS

## 2018-06-10 ENCOUNTER — Inpatient Hospital Stay (HOSPITAL_BASED_OUTPATIENT_CLINIC_OR_DEPARTMENT_OTHER): Payer: 59 | Admitting: Hematology

## 2018-06-10 ENCOUNTER — Encounter: Payer: Self-pay | Admitting: Hematology

## 2018-06-10 ENCOUNTER — Telehealth: Payer: Self-pay | Admitting: Nutrition

## 2018-06-10 ENCOUNTER — Inpatient Hospital Stay: Payer: 59

## 2018-06-10 ENCOUNTER — Telehealth: Payer: Self-pay | Admitting: Hematology

## 2018-06-10 ENCOUNTER — Other Ambulatory Visit: Payer: Self-pay

## 2018-06-10 ENCOUNTER — Inpatient Hospital Stay: Payer: 59 | Admitting: Nutrition

## 2018-06-10 VITALS — BP 128/78 | HR 77 | Temp 97.7°F | Resp 18 | Ht 60.0 in | Wt 156.7 lb

## 2018-06-10 DIAGNOSIS — R634 Abnormal weight loss: Secondary | ICD-10-CM

## 2018-06-10 DIAGNOSIS — I10 Essential (primary) hypertension: Secondary | ICD-10-CM

## 2018-06-10 DIAGNOSIS — Z95828 Presence of other vascular implants and grafts: Secondary | ICD-10-CM

## 2018-06-10 DIAGNOSIS — K922 Gastrointestinal hemorrhage, unspecified: Secondary | ICD-10-CM | POA: Diagnosis not present

## 2018-06-10 DIAGNOSIS — D500 Iron deficiency anemia secondary to blood loss (chronic): Secondary | ICD-10-CM | POA: Diagnosis not present

## 2018-06-10 DIAGNOSIS — Z853 Personal history of malignant neoplasm of breast: Secondary | ICD-10-CM

## 2018-06-10 DIAGNOSIS — C162 Malignant neoplasm of body of stomach: Secondary | ICD-10-CM

## 2018-06-10 DIAGNOSIS — R630 Anorexia: Secondary | ICD-10-CM

## 2018-06-10 DIAGNOSIS — C169 Malignant neoplasm of stomach, unspecified: Secondary | ICD-10-CM

## 2018-06-10 DIAGNOSIS — R197 Diarrhea, unspecified: Secondary | ICD-10-CM

## 2018-06-10 DIAGNOSIS — Z5111 Encounter for antineoplastic chemotherapy: Secondary | ICD-10-CM | POA: Diagnosis not present

## 2018-06-10 DIAGNOSIS — E039 Hypothyroidism, unspecified: Secondary | ICD-10-CM

## 2018-06-10 DIAGNOSIS — E876 Hypokalemia: Secondary | ICD-10-CM

## 2018-06-10 LAB — CMP (CANCER CENTER ONLY)
ALT: 28 U/L (ref 0–44)
AST: 32 U/L (ref 15–41)
Albumin: 3.3 g/dL — ABNORMAL LOW (ref 3.5–5.0)
Alkaline Phosphatase: 80 U/L (ref 38–126)
Anion gap: 10 (ref 5–15)
BUN: 8 mg/dL (ref 6–20)
CO2: 27 mmol/L (ref 22–32)
Calcium: 9.1 mg/dL (ref 8.9–10.3)
Chloride: 103 mmol/L (ref 98–111)
Creatinine: 0.72 mg/dL (ref 0.44–1.00)
GFR, Est AFR Am: >60 mL/min (ref >60)
GFR, Estimated: >60 mL/min (ref >60)
Glucose, Bld: 97 mg/dL (ref 70–99)
Potassium: 3.8 mmol/L (ref 3.5–5.1)
Sodium: 140 mmol/L (ref 135–145)
Total Bilirubin: 0.3 mg/dL (ref 0.3–1.2)
Total Protein: 7.5 g/dL (ref 6.5–8.1)

## 2018-06-10 LAB — CBC WITH DIFFERENTIAL (CANCER CENTER ONLY)
Abs Immature Granulocytes: 0.07 10*3/uL (ref 0.00–0.07)
Basophils Absolute: 0.1 10*3/uL (ref 0.0–0.1)
Basophils Relative: 1 %
Eosinophils Absolute: 0 10*3/uL (ref 0.0–0.5)
Eosinophils Relative: 0 %
HCT: 34 % — ABNORMAL LOW (ref 36.0–46.0)
Hemoglobin: 11.3 g/dL — ABNORMAL LOW (ref 12.0–15.0)
Immature Granulocytes: 1 %
Lymphocytes Relative: 29 %
Lymphs Abs: 1.5 10*3/uL (ref 0.7–4.0)
MCH: 32.6 pg (ref 26.0–34.0)
MCHC: 33.2 g/dL (ref 30.0–36.0)
MCV: 98 fL (ref 80.0–100.0)
MONO ABS: 0.5 10*3/uL (ref 0.1–1.0)
MONOS PCT: 9 %
Neutro Abs: 3.2 10*3/uL (ref 1.7–7.7)
Neutrophils Relative %: 60 %
Platelet Count: 249 10*3/uL (ref 150–400)
RBC: 3.47 MIL/uL — ABNORMAL LOW (ref 3.87–5.11)
RDW: 18.9 % — ABNORMAL HIGH (ref 11.5–15.5)
WBC Count: 5.4 10*3/uL (ref 4.0–10.5)
nRBC: 0 % (ref 0.0–0.2)

## 2018-06-10 LAB — FERRITIN: Ferritin: 811 ng/mL — ABNORMAL HIGH (ref 11–307)

## 2018-06-10 LAB — SAMPLE TO BLOOD BANK

## 2018-06-10 MED ORDER — PANTOPRAZOLE SODIUM 40 MG PO TBEC: 40.0000 mg | DELAYED_RELEASE_TABLET | Freq: Every day | ORAL | 3 refills | Status: DC

## 2018-06-10 MED ORDER — DEXTROSE 5 % IV SOLN
Freq: Once | INTRAVENOUS | Status: AC
  Administered 2018-06-10: 12:00:00 via INTRAVENOUS
  Filled 2018-06-10: qty 250

## 2018-06-10 MED ORDER — LEUCOVORIN CALCIUM INJECTION 350 MG
200.0000 mg/m2 | Freq: Once | INTRAVENOUS | Status: AC
  Administered 2018-06-10: 358 mg via INTRAVENOUS
  Filled 2018-06-10: qty 17.9

## 2018-06-10 MED ORDER — SODIUM CHLORIDE 0.9% FLUSH
10.0000 mL | Freq: Once | INTRAVENOUS | Status: AC
  Administered 2018-06-10: 10 mL
  Filled 2018-06-10: qty 10

## 2018-06-10 MED ORDER — DEXAMETHASONE SODIUM PHOSPHATE 10 MG/ML IJ SOLN
10.0000 mg | Freq: Once | INTRAMUSCULAR | Status: AC
  Administered 2018-06-10: 10 mg via INTRAVENOUS

## 2018-06-10 MED ORDER — PALONOSETRON HCL INJECTION 0.25 MG/5ML
INTRAVENOUS | Status: AC
  Filled 2018-06-10: qty 5

## 2018-06-10 MED ORDER — SODIUM CHLORIDE 0.9 % IV SOLN
INTRAVENOUS | Status: DC
  Administered 2018-06-10: 10:00:00 via INTRAVENOUS
  Filled 2018-06-10: qty 250

## 2018-06-10 MED ORDER — PALONOSETRON HCL INJECTION 0.25 MG/5ML
0.2500 mg | Freq: Once | INTRAVENOUS | Status: AC
  Administered 2018-06-10: 0.25 mg via INTRAVENOUS

## 2018-06-10 MED ORDER — DEXTROSE 5 % IV SOLN
INTRAVENOUS | Status: DC
  Administered 2018-06-10: 12:00:00 via INTRAVENOUS
  Filled 2018-06-10: qty 250

## 2018-06-10 MED ORDER — DEXAMETHASONE SODIUM PHOSPHATE 10 MG/ML IJ SOLN
INTRAMUSCULAR | Status: AC
  Filled 2018-06-10: qty 1

## 2018-06-10 MED ORDER — OXALIPLATIN CHEMO INJECTION 100 MG/20ML
85.0000 mg/m2 | Freq: Once | INTRAVENOUS | Status: AC
  Administered 2018-06-10: 150 mg via INTRAVENOUS
  Filled 2018-06-10: qty 10

## 2018-06-10 MED ORDER — SODIUM CHLORIDE 0.9 % IV SOLN
50.0000 mg/m2 | Freq: Once | INTRAVENOUS | Status: AC
  Administered 2018-06-10: 90 mg via INTRAVENOUS
  Filled 2018-06-10: qty 9

## 2018-06-10 MED ORDER — SODIUM CHLORIDE 0.9% FLUSH
10.0000 mL | INTRAVENOUS | Status: DC | PRN
  Filled 2018-06-10: qty 10

## 2018-06-10 MED ORDER — SODIUM CHLORIDE 0.9 % IV SOLN
2600.0000 mg/m2 | INTRAVENOUS | Status: DC
  Administered 2018-06-10: 4650 mg via INTRAVENOUS
  Filled 2018-06-10: qty 93

## 2018-06-10 NOTE — Telephone Encounter (Signed)
I have tried to contact patient 2 times today without success. Unable to complete nutrition follow up. I have left my contact information on her voice mail in case she has questions.

## 2018-06-10 NOTE — Telephone Encounter (Signed)
No los per 3/25. °

## 2018-06-10 NOTE — Patient Instructions (Signed)
[facility] Discharge Instructions for Patients Receiving Chemotherapy  Today you received the following chemotherapy agents: Taxotere, Oxaliplatin, Leucovorin, Fluorouracil   To help prevent nausea and vomiting after your treatment, we encourage you to take your nausea medication as directed.    If you develop nausea and vomiting that is not controlled by your nausea medication, call the clinic.   BELOW ARE SYMPTOMS THAT SHOULD BE REPORTED IMMEDIATELY:  *FEVER GREATER THAN 100.5 F  *CHILLS WITH OR WITHOUT FEVER  NAUSEA AND VOMITING THAT IS NOT CONTROLLED WITH YOUR NAUSEA MEDICATION  *UNUSUAL SHORTNESS OF BREATH  *UNUSUAL BRUISING OR BLEEDING  TENDERNESS IN MOUTH AND THROAT WITH OR WITHOUT PRESENCE OF ULCERS  *URINARY PROBLEMS  *BOWEL PROBLEMS  UNUSUAL RASH Items with * indicate a potential emergency and should be followed up as soon as possible.  Feel free to call the clinic should you have any questions or concerns. The clinic phone number is [phone].  Please show the CHEMO ALERT CARD at check-in to the Emergency Department and triage nurse.   

## 2018-06-11 ENCOUNTER — Inpatient Hospital Stay: Payer: 59

## 2018-06-11 ENCOUNTER — Other Ambulatory Visit: Payer: Self-pay

## 2018-06-11 ENCOUNTER — Ambulatory Visit: Payer: 59

## 2018-06-11 VITALS — BP 137/79 | HR 81 | Temp 98.3°F | Resp 18

## 2018-06-11 DIAGNOSIS — Z5111 Encounter for antineoplastic chemotherapy: Secondary | ICD-10-CM | POA: Diagnosis not present

## 2018-06-11 DIAGNOSIS — C162 Malignant neoplasm of body of stomach: Secondary | ICD-10-CM

## 2018-06-11 DIAGNOSIS — D500 Iron deficiency anemia secondary to blood loss (chronic): Secondary | ICD-10-CM

## 2018-06-11 DIAGNOSIS — Z95828 Presence of other vascular implants and grafts: Secondary | ICD-10-CM

## 2018-06-11 LAB — CANCER ANTIGEN 19-9: CAN 19-9: 22 U/mL (ref 0–35)

## 2018-06-11 MED ORDER — PEGFILGRASTIM-CBQV 6 MG/0.6ML ~~LOC~~ SOSY
6.0000 mg | PREFILLED_SYRINGE | Freq: Once | SUBCUTANEOUS | Status: AC
  Administered 2018-06-11: 6 mg via SUBCUTANEOUS

## 2018-06-11 MED ORDER — SODIUM CHLORIDE 0.9% FLUSH
10.0000 mL | Freq: Once | INTRAVENOUS | Status: AC
  Administered 2018-06-11: 10 mL
  Filled 2018-06-11: qty 10

## 2018-06-11 MED ORDER — HEPARIN SOD (PORK) LOCK FLUSH 100 UNIT/ML IV SOLN
500.0000 [IU] | Freq: Once | INTRAVENOUS | Status: AC
  Administered 2018-06-11: 500 [IU]
  Filled 2018-06-11: qty 5

## 2018-06-11 MED ORDER — PEGFILGRASTIM-CBQV 6 MG/0.6ML ~~LOC~~ SOSY
PREFILLED_SYRINGE | SUBCUTANEOUS | Status: AC
  Filled 2018-06-11: qty 0.6

## 2018-06-22 NOTE — Progress Notes (Signed)
Mariah Kemp   [phone] [phone]) [phone]   Clinic Follow up Note   Patient Care Team: Emeterio Reeve, DO as PCP - General (Osteopathic Medicine)  Date of Service:  06/24/2018  CHIEF COMPLAINT: f/u of Gastric Cancer  SUMMARY OF ONCOLOGIC HISTORY: Oncology History   Cancer Staging Gastric cancer [facility]) Staging form: Stomach, AJCC 8th Edition - Clinical stage from 03/29/2018: Stage IVA (cT4b, cN1, cM0) - Signed by [name], MD on 04/03/2018       Gastric cancer ([place])   03/19/2018 Procedure    Upper Endoscopy by Dr. Michail Sermon 03/29/18  IMPRESSION - Normal esophagus. - Z-line regular, 38 cm from the incisors. - Non-obstructing oozing gastric ulcer with pigmented material. Biopsied. - Normal examined duodenum. - Acute gastritis.    03/29/2018 Procedure    Colonoscopy by Dr. Michail Sermon 03/29/18 IMPRESSION - Preparation of the colon was fair. - Internal hemorrhoids. - The examined portion of the ileum was normal. - No specimens collected.    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 - SEE COMMENT. Microscopic Comment A Warthin Starry stain is negative for the presence of Helicobacter pylori organisms. Dr Vic Ripper has reviewed the case and concurs with this interpretation. Dr Michail Sermon was paged on 03/31/2018. (JBK:ecj 03/31/2018)    03/29/2018 Cancer Staging    Staging form: Stomach, AJCC 8th Edition - Clinical stage from 03/29/2018: Stage IVA (cT4b, cN1, cM0) - Signed by [name], MD on 04/03/2018    04/02/2018 Initial Diagnosis    Gastric cancer ([place])    04/02/2018 Imaging    CT CAP W Contrast 04/02/18 IMPRESSION: 1. Focal thickening lateral wall proximal stomach with protrusion of low-attenuation soft tissue beyond the expected confines of the gastric wall into the splenic hilum. Imaging features highly concerning for transmural tumor extension into the splenic hilum.  Several small nodules in this region are likely lymph nodes, concerning for metastatic disease. 2. Scattered bilateral pulmonary nodules measuring up to 5 mm. Nonspecific, but close attention on follow-up recommended as metastatic disease not excluded. 3. Small lymph nodes in the gastrohepatic ligament, but there is no gastrohepatic or hepato duodenal ligament lymphadenopathy. No evidence for liver metastases. 4.  Aortic Atherosclerois (ICD10-170.0    04/15/2018 -  Chemotherapy    FLOT4 every 2 weeks starting 04/15/18      CURRENT THERAPY:  NeoadjuvantFLOT4 every 2 weeks starting 04/15/18. Plans to complete 07/22/18  INTERVAL HISTORY:  Mariah Kemp is here for a follow up and treatment. She presents to the clinic today by herself. She notes her taste is completely gone. She denies trouble swallowing. She is down to soup as food. Carbohydrates and meat do not agree with her stomach. She is taking Ensure boost in a smoothie one time a day. She will try carnation instant breakfast.  She notes having diarrhea a few times daily and has been taking imodium. She has been drinking up to 32 ounces of water a day.  She denies any other problems. She has been able to keep her weight stable. She continues to follow up with [employer].  She denies recent cough, chills, fever or SOB. She notes having dry skin and wrinkles. She notes however her nails are growing much faster.     REVIEW OF SYSTEMS:   Constitutional: Denies fevers, chills or abnormal weight loss (+) taste change, low appetite  Eyes: Denies blurriness of vision Ears, nose, mouth, throat, and face: Denies mucositis or  sore throat Respiratory: Denies cough, dyspnea or wheezes Cardiovascular: Denies palpitation, chest discomfort or lower extremity swelling Gastrointestinal:  Denies nausea, heartburn (+) Diarrhea  Skin: Denies abnormal skin rashes (+) Dry skin  Lymphatics: Denies new lymphadenopathy or easy bruising  Neurological:Denies numbness, tingling or new weaknesses Behavioral/Psych: Mood is stable, no new changes  All other systems were reviewed with the patient and are negative.  MEDICAL HISTORY:  Past Medical History:  Diagnosis Date  . Cancer ([place])   . Hypertension   . Thyroid disease     SURGICAL HISTORY: Past Surgical History:  Procedure Laterality Date  . ABDOMINAL HYSTERECTOMY  1996  . BILATERAL SALPINGOOPHORECTOMY  2007  . BIOPSY  03/29/2018   Procedure: BIOPSY;  Surgeon: Wilford Corner, MD;  Location: [place];  Service: Endoscopy;;  . CARPAL TUNNEL RELEASE    . COLONOSCOPY N/A 03/29/2018   Procedure: COLONOSCOPY;  Surgeon: Wilford Corner, MD;  Location: [facility] ENDOSCOPY;  Service: Endoscopy;  Laterality: N/A;  . ESOPHAGOGASTRODUODENOSCOPY N/A 03/29/2018   Procedure: ESOPHAGOGASTRODUODENOSCOPY (EGD);  Surgeon: Wilford Corner, MD;  Location: [place];  Service: Endoscopy;  Laterality: N/A;  . LAPAROSCOPY N/A 04/13/2018   Procedure: LAPAROSCOPY DIAGNOSTIC;  Surgeon: [name], MD;  Location: [facility];  Service: General;  Laterality: N/A;  . MASTECTOMY  2007  . PORTACATH PLACEMENT Left 04/13/2018   Procedure: INSERTION PORT-A-CATH ERAS PATHWAY;  Surgeon: [name], MD;  Location: [facility];  Service: General;  Laterality: Left;  subclavian    I have reviewed the social history and family history with the patient and they are unchanged from previous note.  ALLERGIES:  has No Known Allergies.  MEDICATIONS:  Current Outpatient Medications  Medication Sig Dispense Refill  . acyclovir (ZOVIRAX) 400 MG tablet Take 1 tablet (400 mg total) by mouth 3 (three) times daily. For five days. As needed for cold sore. 15 tablet 0  . albuterol (PROVENTIL HFA;VENTOLIN HFA) 108 (90 Base) MCG/ACT inhaler Inhale 1-2 puffs into the lungs every 4 (four) hours as needed for wheezing or shortness of breath. 2 Inhaler 3  . budesonide-formoterol (SYMBICORT) 160-4.5 MCG/ACT inhaler Inhale 2  puffs into the lungs 2 (two) times daily. (Patient taking differently: Inhale 2 puffs into the lungs 2 (two) times daily as needed (respiratory issues.). ) 2 Inhaler 3  . dexamethasone (DECADRON) 4 MG tablet Take 1 tablet (4 mg total) by mouth 2 (two) times daily with a meal. 30 tablet 2  . docusate sodium (COLACE) 100 MG capsule Take 100 mg by mouth 2 (two) times daily.    . ferrous sulfate 325 (65 FE) MG tablet Take 1 tablet (325 mg total) by mouth 2 (two) times daily with a meal. 90 tablet 0  . hydrochlorothiazide (HYDRODIURIL) 25 MG tablet Take 1 tablet (25 mg total) by mouth daily. 30 tablet 0  . KLOR-CON M20 20 MEQ tablet TAKE 1 TABLET BY MOUTH 2 TIMES DAILY X 3 DAYS, THEN ONE DAILY THEREAFTER. 36 tablet 1  . levothyroxine (SYNTHROID, LEVOTHROID) 150 MCG tablet Take 1 tablet (150 mcg total) by mouth daily before breakfast. 90 tablet 3  . loperamide (IMODIUM A-D) 2 MG tablet Take 2 mg by mouth 4 (four) times daily as needed for diarrhea or loose stools.    . ondansetron (ZOFRAN) 8 MG tablet Take 1 tablet (8 mg total) by mouth every 8 (eight) hours as needed for nausea or vomiting. 30 tablet 1  . oxyCODONE (OXY IR/ROXICODONE) 5 MG immediate release tablet Take 1 tablet (5 mg total)  by mouth every 6 (six) hours as needed for severe pain. 10 tablet 0  . pantoprazole (PROTONIX) 40 MG tablet Take 1 tablet (40 mg total) by mouth daily for 30 days. 30 tablet 3  . Prenatal Vit-Fe Fumarate-FA (PRENATAL MULTIVITAMIN) TABS tablet Take 1 tablet by mouth daily at 12 noon.    . prochlorperazine (COMPAZINE) 10 MG tablet Take 1 tablet (10 mg total) by mouth every 6 (six) hours as needed for nausea or vomiting. 30 tablet 2  . vitamin B-12 1000 MCG tablet Take 1 tablet (1,000 mcg total) by mouth daily. 30 tablet 0   No current facility-administered medications for this visit.    Facility-Administered Medications Ordered in Other Visits  Medication Dose Route Frequency Provider Last Rate Last Dose  . 0.9 %   sodium chloride infusion   Intravenous Continuous [name], MD 20 mL/hr at 06/24/18 0913    . dextrose 5 % solution   Intravenous Continuous [name], MD      . dextrose 5 % solution   Intravenous Once [name], MD      . DOCEtaxel (TAXOTERE) 90 mg in sodium chloride 0.9 % 250 mL chemo infusion  50 mg/m2 (Treatment Plan Recorded) Intravenous Once [name], MD      . fluorouracil (ADRUCIL) 4,650 mg in sodium chloride 0.9 % 57 mL chemo infusion  2,600 mg/m2 (Treatment Plan Recorded) Intravenous 1 day or 1 dose [name], MD      . heparin lock flush 100 unit/mL  500 Units Intracatheter Once PRN [name], MD      . leucovorin 358 mg in dextrose 5 % 250 mL infusion  200 mg/m2 (Treatment Plan Recorded) Intravenous Once [name], MD      . oxaliplatin (ELOXATIN) 150 mg in dextrose 5 % 500 mL chemo infusion  85 mg/m2 (Treatment Plan Recorded) Intravenous Once [name], MD      . sodium chloride flush (NS) 0.9 % injection 10 mL  10 mL Intracatheter PRN [name], MD        PHYSICAL EXAMINATION: ECOG PERFORMANCE STATUS: 1 - Symptomatic but completely ambulatory  Vitals:   06/24/18 0829  BP: 132/80  Pulse: 80  Resp: 18  Temp: 97.9 F (36.6 C)  SpO2: 100%   Filed Weights   06/24/18 0829  Weight: 155 lb 1.6 oz (70.4 kg)    GENERAL:alert, no distress and comfortable SKIN: skin color, texture, turgor are normal, no rashes or significant lesions EYES: normal, Conjunctiva are pink and non-injected, sclera clear OROPHARYNX:no exudate, no erythema and lips, buccal mucosa, and tongue normal  NECK: supple, thyroid normal size, non-tender, without nodularity LYMPH:  no palpable lymphadenopathy in the cervical, axillary or inguinal LUNGS: clear to auscultation and percussion with normal breathing effort HEART: regular rate & rhythm and no murmurs and no lower extremity edema ABDOMEN:abdomen soft, non-tender and normal bowel sounds Musculoskeletal:no cyanosis of digits and no clubbing  NEURO:  alert & oriented x 3 with fluent speech, no focal motor/sensory deficits  LABORATORY DATA:  I have reviewed the data as listed CBC Latest Ref Rng & Units 06/24/2018 06/10/2018 05/27/2018  WBC 4.0 - 10.5 K/uL 17.7(H) 5.4 11.6(H)  Hemoglobin 12.0 - 15.0 g/dL 11.2(L) 11.3(L) 9.6(L)  Hematocrit 36.0 - 46.0 % 33.8(L) 34.0(L) 29.4(L)  Platelets 150 - 400 K/uL 108(L) 249 125(L)     CMP Latest Ref Rng & Units 06/24/2018 06/10/2018 05/27/2018  Glucose 70 - 99 mg/dL 181(H) 97 175(H)  BUN 6 - 20  mg/dL 9 8 14   Creatinine 0.44 - 1.00 mg/dL 0.82 0.72 0.78  Sodium 135 - 145 mmol/L 137 140 140  Potassium 3.5 - 5.1 mmol/L 3.9 3.8 4.3  Chloride 98 - 111 mmol/L 102 103 105  CO2 22 - 32 mmol/L 23 27 24   Calcium 8.9 - 10.3 mg/dL 9.4 9.1 9.3  Total Protein 6.5 - 8.1 g/dL 7.7 7.5 7.0  Total Bilirubin 0.3 - 1.2 mg/dL 0.3 0.3 0.3  Alkaline Phos 38 - 126 U/L 100 80 97  AST 15 - 41 U/L 25 32 26  ALT 0 - 44 U/L 19 28 32      RADIOGRAPHIC STUDIES: I have personally reviewed the radiological images as listed and agreed with the findings in the report. No results found.   ASSESSMENT & PLAN:  Mariah Kemp is a 58 y.o. female with   1. 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 -She has seen surgeon Dr. Barry Dienes, and underwent exploratory laparoscope which was negative for peritoneal metastasis. -Given her locally advanced disease, she started neo-adjuvant chemoFLOT4 every 2 weeks for 4 monthson 04/15/18.She is toleratingmoderately well with dry skin, taste change, appetite loss with mild-moderate malnutrition and moderate diarrhea. Currently well managed and stable.  -She will continue Protonix.  -Labs reviewed, CBC shows WBC 17.7, Hg 11.2, PLT 108K, CMP WNL excpet BG 181. Ferritin is still pending. Overall adequate to proceed with cycle 6 today. Total of 8 cycles is planned for total neoadjuvant chemotherapy before proceeding with surgery.   -F/u in 2 weeks  -Plan to repeat restaging scan after cycle 8 therapy, and she will see Dr. Barry Dienes after the scan   2. Anemia, iron deficient from chronic GI bleeding. -Secondary to GI blood loss  -Given 3u blood transfusion on 04/01/18, responded moderately well.  -She is s/p 2 doses of IV iron in 03/2018, she responded moderately wellwith improved anemia. -Hemoglobin11.2 today(06/24/18), Ferritin still pending. Will continue monitoring  3. H/o stage 3 right breast cancer in 2007 -Treated with right mastectomy, reconstruction with 3 LN removed. She underwent chemotherapy for 5 months, likely AC-T. She completed Tamoxifen for at least 5 years.   4. Genetic  -I discussed her eligibility for genetic testing. She has personal history of breast and gastric cancer. She waspreviouslyreferred. She has not been seen yet, will check with genetics department   5. Hypokalemia -She will continue potassium chloride 20 meq daily -K at3.9today (06/24/18/20)   6. HTN andhypothyroidism -We will hold hydrochlorothiazide for now during the chemo -HTN well controlled -Continue to follow-up with PCP  7. Diarrhea, Low appetite, weight loss, Mild to moderate Malnutrition -She has had poor appetite with 4th cycle due to taste change resulting in continued weight loss -She also has had an increase in diarrhea. Moderately controlled with 2 imodium at first loose BM. She is fine to increase imodium if needed. Stable.  -Her taste is gone during most of her treatment and meat and carbohydrates due not agree with her stomach. She mainly eats soups and drinks adequate amount of water. She will continue ensure boost and try carnation breakfast.  -I encouraged her to find what foods work for her to increase her calorie and protein intake.  -continue to Follow up with [place].  -Weight stable this week.   PLAN: -Lab reviewed, adequate for treatment, will proceed to cycle6FLOT4 today -Lab,  flush, f/u and FLOT4 in 2 and 4 weeks   No problem-specific Assessment &  Plan notes found for this encounter.   No orders of the defined types were placed in this encounter.  All questions were answered. The patient knows to call the clinic with any problems, questions or concerns. No barriers to learning was detected. I spent 15 minutes counseling the patient face to face. The total time spent in the appointment was 20 minutes and more than 50% was on counseling and review of test results     [name], MD 06/24/2018   I, Joslyn Devon, am acting as scribe for [name], MD.   I have reviewed the above documentation for accuracy and completeness, and I agree with the above.

## 2018-06-24 ENCOUNTER — Inpatient Hospital Stay: Payer: 59

## 2018-06-24 ENCOUNTER — Inpatient Hospital Stay (HOSPITAL_BASED_OUTPATIENT_CLINIC_OR_DEPARTMENT_OTHER): Payer: 59 | Admitting: Hematology

## 2018-06-24 ENCOUNTER — Inpatient Hospital Stay: Payer: 59 | Attending: Hematology

## 2018-06-24 ENCOUNTER — Telehealth: Payer: Self-pay | Admitting: Hematology

## 2018-06-24 ENCOUNTER — Other Ambulatory Visit: Payer: Self-pay

## 2018-06-24 ENCOUNTER — Encounter: Payer: Self-pay | Admitting: Hematology

## 2018-06-24 VITALS — BP 132/80 | HR 80 | Temp 97.9°F | Resp 18 | Ht 60.0 in | Wt 155.1 lb

## 2018-06-24 DIAGNOSIS — C162 Malignant neoplasm of body of stomach: Secondary | ICD-10-CM

## 2018-06-24 DIAGNOSIS — C169 Malignant neoplasm of stomach, unspecified: Secondary | ICD-10-CM

## 2018-06-24 DIAGNOSIS — E46 Unspecified protein-calorie malnutrition: Secondary | ICD-10-CM

## 2018-06-24 DIAGNOSIS — Z5111 Encounter for antineoplastic chemotherapy: Secondary | ICD-10-CM | POA: Insufficient documentation

## 2018-06-24 DIAGNOSIS — I10 Essential (primary) hypertension: Secondary | ICD-10-CM | POA: Diagnosis not present

## 2018-06-24 DIAGNOSIS — Z5189 Encounter for other specified aftercare: Secondary | ICD-10-CM | POA: Diagnosis not present

## 2018-06-24 DIAGNOSIS — K922 Gastrointestinal hemorrhage, unspecified: Secondary | ICD-10-CM | POA: Insufficient documentation

## 2018-06-24 DIAGNOSIS — D500 Iron deficiency anemia secondary to blood loss (chronic): Secondary | ICD-10-CM

## 2018-06-24 DIAGNOSIS — E876 Hypokalemia: Secondary | ICD-10-CM

## 2018-06-24 DIAGNOSIS — E039 Hypothyroidism, unspecified: Secondary | ICD-10-CM

## 2018-06-24 DIAGNOSIS — Z95828 Presence of other vascular implants and grafts: Secondary | ICD-10-CM

## 2018-06-24 DIAGNOSIS — Z853 Personal history of malignant neoplasm of breast: Secondary | ICD-10-CM

## 2018-06-24 DIAGNOSIS — R630 Anorexia: Secondary | ICD-10-CM

## 2018-06-24 DIAGNOSIS — Z9011 Acquired absence of right breast and nipple: Secondary | ICD-10-CM

## 2018-06-24 DIAGNOSIS — R634 Abnormal weight loss: Secondary | ICD-10-CM

## 2018-06-24 DIAGNOSIS — R197 Diarrhea, unspecified: Secondary | ICD-10-CM

## 2018-06-24 LAB — CBC WITH DIFFERENTIAL (CANCER CENTER ONLY)
Abs Immature Granulocytes: 1.08 10*3/uL — ABNORMAL HIGH (ref 0.00–0.07)
Basophils Absolute: 0 10*3/uL (ref 0.0–0.1)
Basophils Relative: 0 %
Eosinophils Absolute: 0 10*3/uL (ref 0.0–0.5)
Eosinophils Relative: 0 %
HCT: 33.8 % — ABNORMAL LOW (ref 36.0–46.0)
Hemoglobin: 11.2 g/dL — ABNORMAL LOW (ref 12.0–15.0)
Immature Granulocytes: 6 %
Lymphocytes Relative: 7 %
Lymphs Abs: 1.3 10*3/uL (ref 0.7–4.0)
MCH: 33 pg (ref 26.0–34.0)
MCHC: 33.1 g/dL (ref 30.0–36.0)
MCV: 99.7 fL (ref 80.0–100.0)
Monocytes Absolute: 0.2 10*3/uL (ref 0.1–1.0)
Monocytes Relative: 1 %
Neutro Abs: 15 10*3/uL — ABNORMAL HIGH (ref 1.7–7.7)
Neutrophils Relative %: 86 %
Platelet Count: 108 10*3/uL — ABNORMAL LOW (ref 150–400)
RBC: 3.39 MIL/uL — ABNORMAL LOW (ref 3.87–5.11)
RDW: 16.9 % — ABNORMAL HIGH (ref 11.5–15.5)
WBC Count: 17.7 10*3/uL — ABNORMAL HIGH (ref 4.0–10.5)
nRBC: 0 % (ref 0.0–0.2)

## 2018-06-24 LAB — CMP (CANCER CENTER ONLY)
ALT: 19 U/L (ref 0–44)
AST: 25 U/L (ref 15–41)
Albumin: 3.5 g/dL (ref 3.5–5.0)
Alkaline Phosphatase: 100 U/L (ref 38–126)
Anion gap: 12 (ref 5–15)
BUN: 9 mg/dL (ref 6–20)
CO2: 23 mmol/L (ref 22–32)
Calcium: 9.4 mg/dL (ref 8.9–10.3)
Chloride: 102 mmol/L (ref 98–111)
Creatinine: 0.82 mg/dL (ref 0.44–1.00)
GFR, Est AFR Am: >60 mL/min (ref >60)
GFR, Estimated: >60 mL/min (ref >60)
Glucose, Bld: 181 mg/dL — ABNORMAL HIGH (ref 70–99)
Potassium: 3.9 mmol/L (ref 3.5–5.1)
Sodium: 137 mmol/L (ref 135–145)
Total Bilirubin: 0.3 mg/dL (ref 0.3–1.2)
Total Protein: 7.7 g/dL (ref 6.5–8.1)

## 2018-06-24 LAB — SAMPLE TO BLOOD BANK

## 2018-06-24 LAB — FERRITIN: Ferritin: 864 ng/mL — ABNORMAL HIGH (ref 11–307)

## 2018-06-24 MED ORDER — HEPARIN SOD (PORK) LOCK FLUSH 100 UNIT/ML IV SOLN
500.0000 [IU] | Freq: Once | INTRAVENOUS | Status: DC | PRN
  Filled 2018-06-24: qty 5

## 2018-06-24 MED ORDER — OXALIPLATIN CHEMO INJECTION 100 MG/20ML
85.0000 mg/m2 | Freq: Once | INTRAVENOUS | Status: AC
  Administered 2018-06-24: 12:00:00 150 mg via INTRAVENOUS
  Filled 2018-06-24: qty 20

## 2018-06-24 MED ORDER — SODIUM CHLORIDE 0.9 % IV SOLN
INTRAVENOUS | Status: DC
  Administered 2018-06-24: 09:00:00 via INTRAVENOUS
  Filled 2018-06-24: qty 250

## 2018-06-24 MED ORDER — DEXAMETHASONE SODIUM PHOSPHATE 10 MG/ML IJ SOLN
INTRAMUSCULAR | Status: AC
  Filled 2018-06-24: qty 1

## 2018-06-24 MED ORDER — SODIUM CHLORIDE 0.9% FLUSH
10.0000 mL | Freq: Once | INTRAVENOUS | Status: DC
  Filled 2018-06-24: qty 10

## 2018-06-24 MED ORDER — SODIUM CHLORIDE 0.9% FLUSH
10.0000 mL | INTRAVENOUS | Status: DC | PRN
  Filled 2018-06-24: qty 10

## 2018-06-24 MED ORDER — DEXTROSE 5 % IV SOLN
INTRAVENOUS | Status: DC
  Administered 2018-06-24: 12:00:00 via INTRAVENOUS
  Filled 2018-06-24: qty 250

## 2018-06-24 MED ORDER — PALONOSETRON HCL INJECTION 0.25 MG/5ML
INTRAVENOUS | Status: AC
  Filled 2018-06-24: qty 5

## 2018-06-24 MED ORDER — LEUCOVORIN CALCIUM INJECTION 350 MG
200.0000 mg/m2 | Freq: Once | INTRAVENOUS | Status: AC
  Administered 2018-06-24: 358 mg via INTRAVENOUS
  Filled 2018-06-24: qty 17.9

## 2018-06-24 MED ORDER — DEXTROSE 5 % IV SOLN
Freq: Once | INTRAVENOUS | Status: AC
  Administered 2018-06-24: 12:00:00 via INTRAVENOUS
  Filled 2018-06-24: qty 250

## 2018-06-24 MED ORDER — SODIUM CHLORIDE 0.9 % IV SOLN
50.0000 mg/m2 | Freq: Once | INTRAVENOUS | Status: AC
  Administered 2018-06-24: 10:00:00 90 mg via INTRAVENOUS
  Filled 2018-06-24: qty 9

## 2018-06-24 MED ORDER — DEXAMETHASONE SODIUM PHOSPHATE 10 MG/ML IJ SOLN
10.0000 mg | Freq: Once | INTRAMUSCULAR | Status: AC
  Administered 2018-06-24: 10 mg via INTRAVENOUS

## 2018-06-24 MED ORDER — PALONOSETRON HCL INJECTION 0.25 MG/5ML
0.2500 mg | Freq: Once | INTRAVENOUS | Status: AC
  Administered 2018-06-24: 0.25 mg via INTRAVENOUS

## 2018-06-24 MED ORDER — SODIUM CHLORIDE 0.9 % IV SOLN
2600.0000 mg/m2 | INTRAVENOUS | Status: DC
  Administered 2018-06-24: 14:00:00 4650 mg via INTRAVENOUS
  Filled 2018-06-24: qty 93

## 2018-06-24 NOTE — Patient Instructions (Signed)
[facility] Discharge Instructions for Patients Receiving Chemotherapy  Today you received the following chemotherapy agents: Taxotere, Oxaliplatin, Leucovorin, Fluorouracil   To help prevent nausea and vomiting after your treatment, we encourage you to take your nausea medication as directed.    If you develop nausea and vomiting that is not controlled by your nausea medication, call the clinic.   BELOW ARE SYMPTOMS THAT SHOULD BE REPORTED IMMEDIATELY:  *FEVER GREATER THAN 100.5 F  *CHILLS WITH OR WITHOUT FEVER  NAUSEA AND VOMITING THAT IS NOT CONTROLLED WITH YOUR NAUSEA MEDICATION  *UNUSUAL SHORTNESS OF BREATH  *UNUSUAL BRUISING OR BLEEDING  TENDERNESS IN MOUTH AND THROAT WITH OR WITHOUT PRESENCE OF ULCERS  *URINARY PROBLEMS  *BOWEL PROBLEMS  UNUSUAL RASH Items with * indicate a potential emergency and should be followed up as soon as possible.  Feel free to call the clinic should you have any questions or concerns. The clinic phone number is [phone].  Please show the CHEMO ALERT CARD at check-in to the Emergency Department and triage nurse.   

## 2018-06-24 NOTE — Patient Instructions (Signed)

## 2018-06-24 NOTE — Telephone Encounter (Signed)
No los per 4/8. °

## 2018-06-25 ENCOUNTER — Inpatient Hospital Stay: Payer: 59

## 2018-06-25 ENCOUNTER — Other Ambulatory Visit: Payer: Self-pay

## 2018-06-25 ENCOUNTER — Ambulatory Visit: Payer: 59

## 2018-06-25 VITALS — BP 128/78 | HR 78 | Temp 98.2°F | Resp 17

## 2018-06-25 DIAGNOSIS — Z5111 Encounter for antineoplastic chemotherapy: Secondary | ICD-10-CM | POA: Diagnosis not present

## 2018-06-25 DIAGNOSIS — C162 Malignant neoplasm of body of stomach: Secondary | ICD-10-CM

## 2018-06-25 MED ORDER — SODIUM CHLORIDE 0.9% FLUSH
10.0000 mL | INTRAVENOUS | Status: DC | PRN
  Administered 2018-06-25: 10 mL
  Filled 2018-06-25: qty 10

## 2018-06-25 MED ORDER — PEGFILGRASTIM-CBQV 6 MG/0.6ML ~~LOC~~ SOSY
PREFILLED_SYRINGE | SUBCUTANEOUS | Status: AC
  Filled 2018-06-25: qty 0.6

## 2018-06-25 MED ORDER — HEPARIN SOD (PORK) LOCK FLUSH 100 UNIT/ML IV SOLN
500.0000 [IU] | Freq: Once | INTRAVENOUS | Status: AC | PRN
  Administered 2018-06-25: 500 [IU]
  Filled 2018-06-25: qty 5

## 2018-06-25 MED ORDER — PEGFILGRASTIM-CBQV 6 MG/0.6ML ~~LOC~~ SOSY
6.0000 mg | PREFILLED_SYRINGE | Freq: Once | SUBCUTANEOUS | Status: AC
  Administered 2018-06-25: 6 mg via SUBCUTANEOUS

## 2018-06-25 NOTE — Patient Instructions (Signed)
Pegfilgrastim injection  What is this medicine?  PEGFILGRASTIM (PEG fil gra stim) is a long-acting granulocyte colony-stimulating factor that stimulates the growth of neutrophils, a type of white blood cell important in the body's fight against infection. It is used to reduce the incidence of fever and infection in patients with certain types of cancer who are receiving chemotherapy that affects the bone marrow, and to increase survival after being exposed to high doses of radiation.  This medicine may be used for other purposes; ask your health care provider or pharmacist if you have questions.  COMMON BRAND NAME(S): Fulphila, Neulasta, UDENYCA  What should I tell my health care provider before I take this medicine?  They need to know if you have any of these conditions:  -kidney disease  -latex allergy  -ongoing radiation therapy  -sickle cell disease  -skin reactions to acrylic adhesives (On-Body Injector only)  -an unusual or allergic reaction to pegfilgrastim, filgrastim, other medicines, foods, dyes, or preservatives  -pregnant or trying to get pregnant  -breast-feeding  How should I use this medicine?  This medicine is for injection under the skin. If you get this medicine at home, you will be taught how to prepare and give the pre-filled syringe or how to use the On-body Injector. Refer to the patient Instructions for Use for detailed instructions. Use exactly as directed. Tell your healthcare provider immediately if you suspect that the On-body Injector may not have performed as intended or if you suspect the use of the On-body Injector resulted in a missed or partial dose.  It is important that you put your used needles and syringes in a special sharps container. Do not put them in a trash can. If you do not have a sharps container, call your pharmacist or healthcare provider to get one.  Talk to your pediatrician regarding the use of this medicine in children. While this drug may be prescribed for  selected conditions, precautions do apply.  Overdosage: If you think you have taken too much of this medicine contact a poison control center or emergency room at once.  NOTE: This medicine is only for you. Do not share this medicine with others.  What if I miss a dose?  It is important not to miss your dose. Call your doctor or health care professional if you miss your dose. If you miss a dose due to an On-body Injector failure or leakage, a new dose should be administered as soon as possible using a single prefilled syringe for manual use.  What may interact with this medicine?  Interactions have not been studied.  Give your health care provider a list of all the medicines, herbs, non-prescription drugs, or dietary supplements you use. Also tell them if you smoke, drink alcohol, or use illegal drugs. Some items may interact with your medicine.  This list may not describe all possible interactions. Give your health care provider a list of all the medicines, herbs, non-prescription drugs, or dietary supplements you use. Also tell them if you smoke, drink alcohol, or use illegal drugs. Some items may interact with your medicine.  What should I watch for while using this medicine?  You may need blood work done while you are taking this medicine.  If you are going to need a MRI, CT scan, or other procedure, tell your doctor that you are using this medicine (On-Body Injector only).  What side effects may I notice from receiving this medicine?  Side effects that you should report to   your doctor or health care professional as soon as possible:  -allergic reactions like skin rash, itching or hives, swelling of the face, lips, or tongue  -back pain  -dizziness  -fever  -pain, redness, or irritation at site where injected  -pinpoint red spots on the skin  -red or dark-brown urine  -shortness of breath or breathing problems  -stomach or side pain, or pain at the shoulder  -swelling  -tiredness  -trouble passing urine or  change in the amount of urine  Side effects that usually do not require medical attention (report to your doctor or health care professional if they continue or are bothersome):  -bone pain  -muscle pain  This list may not describe all possible side effects. Call your doctor for medical advice about side effects. You may report side effects to FDA at 1-800-FDA-1088.  Where should I keep my medicine?  Keep out of the reach of children.  If you are using this medicine at home, you will be instructed on how to store it. Throw away any unused medicine after the expiration date on the label.  NOTE: This sheet is a summary. It may not cover all possible information. If you have questions about this medicine, talk to your doctor, pharmacist, or health care provider.   2019 Elsevier/Gold Standard (2017-06-09 16:57:08)

## 2018-06-29 ENCOUNTER — Inpatient Hospital Stay: Payer: 59 | Admitting: Genetic Counselor

## 2018-07-06 NOTE — Progress Notes (Signed)
[place]   [phone] [phone]   Clinic Follow up Note   Patient Care Team: Emeterio Reeve, DO as PCP - General (Osteopathic Medicine)  Date of Service:  07/08/2018  CHIEF COMPLAINT:  f/u of Gastric Cancer  SUMMARY OF ONCOLOGIC HISTORY: Oncology History   Cancer Staging Gastric cancer [facility]) Staging form: Stomach, AJCC 8th Edition - Clinical stage from 03/29/2018: Stage IVA (cT4b, cN1, cM0) - Signed by [name], MD on 04/03/2018       Gastric cancer ([place])   03/19/2018 Procedure    Upper Endoscopy by Dr. Michail Sermon 03/29/18  IMPRESSION - Normal esophagus. - Z-line regular, 38 cm from the incisors. - Non-obstructing oozing gastric ulcer with pigmented material. Biopsied. - Normal examined duodenum. - Acute gastritis.    03/29/2018 Procedure    Colonoscopy by Dr. Michail Sermon 03/29/18 IMPRESSION - Preparation of the colon was fair. - Internal hemorrhoids. - The examined portion of the ileum was normal. - No specimens collected.    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 - SEE COMMENT. Microscopic Comment A Warthin Starry stain is negative for the presence of Helicobacter pylori organisms. Dr Vic Ripper has reviewed the case and concurs with this interpretation. Dr Michail Sermon was paged on 03/31/2018. (JBK:ecj 03/31/2018)    03/29/2018 Cancer Staging    Staging form: Stomach, AJCC 8th Edition - Clinical stage from 03/29/2018: Stage IVA (cT4b, cN1, cM0) - Signed by [name], MD on 04/03/2018    04/02/2018 Initial Diagnosis    Gastric cancer ([place])    04/02/2018 Imaging    CT CAP W Contrast 04/02/18 IMPRESSION: 1. Focal thickening lateral wall proximal stomach with protrusion of low-attenuation soft tissue beyond the expected confines of the gastric wall into the splenic hilum. Imaging features highly concerning for transmural tumor extension into the splenic hilum.  Several small nodules in this region are likely lymph nodes, concerning for metastatic disease. 2. Scattered bilateral pulmonary nodules measuring up to 5 mm. Nonspecific, but close attention on follow-up recommended as metastatic disease not excluded. 3. Small lymph nodes in the gastrohepatic ligament, but there is no gastrohepatic or hepato duodenal ligament lymphadenopathy. No evidence for liver metastases. 4.  Aortic Atherosclerois (ICD10-170.0    04/15/2018 -  Chemotherapy    FLOT4 every 2 weeks starting 04/15/18. Plan to complete 07/22/18.      CURRENT THERAPY:  NeoadjuvantFLOT4 every 2 weeks starting 04/15/18. Plan to complete 07/22/18  INTERVAL HISTORY:  Mariah Kemp is here for a follow up and treatment. She presents to the clinic today by herself. She notes chemo is getting harder for her. She notes her last pump made her more sick and the pump stopped 2 hours before it was suppose to stop. She has spiked a high fever with alternating hot and cold when pump was on. These symptoms resolved after pump D/c She still has loss of appetite which lead to another loss of 5 pounds. She notes having mild neuropathy only on her fingers which is stable. She still has normal motor function. She denies mouth sores.  I reviewed her medication list with her. She has needed inhaler, HCTZ, and oxycodone.    REVIEW OF SYSTEMS:   Constitutional: Denies fevers, chills (+) Loss of appetite, weight loss Eyes: Denies blurriness of vision Ears, nose, mouth, throat, and face: Denies mucositis or sore throat Respiratory: Denies cough, dyspnea or wheezes Cardiovascular: Denies palpitation, chest discomfort or lower extremity swelling Gastrointestinal:  Denies nausea, heartburn or change in bowel habits Skin: Denies abnormal skin rashes Lymphatics: Denies new lymphadenopathy or easy bruising Neurological: (+) Mild neuropathy of fingers.  Behavioral/Psych: Mood is stable, no new changes  All other  systems were reviewed with the patient and are negative.  MEDICAL HISTORY:  Past Medical History:  Diagnosis Date  . Cancer ([place])   . Hypertension   . Thyroid disease     SURGICAL HISTORY: Past Surgical History:  Procedure Laterality Date  . ABDOMINAL HYSTERECTOMY  1996  . BILATERAL SALPINGOOPHORECTOMY  2007  . BIOPSY  03/29/2018   Procedure: BIOPSY;  Surgeon: Wilford Corner, MD;  Location: [place];  Service: Endoscopy;;  . CARPAL TUNNEL RELEASE    . COLONOSCOPY N/A 03/29/2018   Procedure: COLONOSCOPY;  Surgeon: Wilford Corner, MD;  Location[facility] END[facility]COPY;  Service: Endoscopy;  Laterality: N/A;  . ESOPHAGOGASTRODUODENOSCOPY N/A 03/29/2018   Procedure: ESOPHAGOGASTRODUODENOSCOPY (EGD);  Surgeon: Wilford Corner, MD;  Location: [place];  Service: Endoscopy;  Laterality: N/A;  . LAPAROSCOPY N/A 04/13/2018   Procedure: LAPAROSCOPY DIAGNOSTIC;  Surgeon: [name], MD;  Location: [facility];  Service: General;  Laterality: N/A;  . MASTECTOMY  2007  . PORTACATH PLACEMENT Left 04/13/2018   Procedure: INSERTION PORT-A-CATH ERAS PATHWAY;  Surgeon: [name], MD;  Location: [facility];  Service: General;  Laterality: Left;  subclavian    I have reviewed the social history and family history with the patient and they are unchanged from previous note.  ALLERGIES:  has No Known Allergies.  MEDICATIONS:  Current Outpatient Medications  Medication Sig Dispense Refill  . acyclovir (ZOVIRAX) 400 MG tablet Take 1 tablet (400 mg total) by mouth 3 (three) times daily. For five days. As needed for cold sore. 15 tablet 0  . albuterol (PROVENTIL HFA;VENTOLIN HFA) 108 (90 Base) MCG/ACT inhaler Inhale 1-2 puffs into the lungs every 4 (four) hours as needed for wheezing or shortness of breath. 2 Inhaler 3  . budesonide-formoterol (SYMBICORT) 160-4.5 MCG/ACT inhaler Inhale 2 puffs into the lungs 2 (two) times daily. (Patient taking differently: Inhale 2 puffs into the lungs 2 (two) times daily  as needed (respiratory issues.). ) 2 Inhaler 3  . dexamethasone (DECADRON) 4 MG tablet Take 1 tablet (4 mg total) by mouth 2 (two) times daily with a meal. 30 tablet 2  . docusate sodium (COLACE) 100 MG capsule Take 100 mg by mouth 2 (two) times daily.    . ferrous sulfate 325 (65 FE) MG tablet Take 1 tablet (325 mg total) by mouth 2 (two) times daily with a meal. 90 tablet 0  . hydrochlorothiazide (HYDRODIURIL) 25 MG tablet Take 1 tablet (25 mg total) by mouth daily. 30 tablet 0  . KLOR-CON M20 20 MEQ tablet TAKE 1 TABLET BY MOUTH 2 TIMES DAILY X 3 DAYS, THEN ONE DAILY THEREAFTER. 36 tablet 1  . levothyroxine (SYNTHROID, LEVOTHROID) 150 MCG tablet Take 1 tablet (150 mcg total) by mouth daily before breakfast. 90 tablet 3  . loperamide (IMODIUM A-D) 2 MG tablet Take 2 mg by mouth 4 (four) times daily as needed for diarrhea or loose stools.    . ondansetron (ZOFRAN) 8 MG tablet Take 1 tablet (8 mg total) by mouth every 8 (eight) hours as needed for nausea or vomiting. 30 tablet 1  . oxyCODONE (OXY IR/ROXICODONE) 5 MG immediate release tablet Take 1 tablet (5 mg total) by mouth every 6 (six) hours as needed for severe pain. 10 tablet 0  . pantoprazole (PROTONIX) 40 MG  tablet Take 1 tablet (40 mg total) by mouth daily for 30 days. 30 tablet 3  . Prenatal Vit-Fe Fumarate-FA (PRENATAL MULTIVITAMIN) TABS tablet Take 1 tablet by mouth daily at 12 noon.    . prochlorperazine (COMPAZINE) 10 MG tablet Take 1 tablet (10 mg total) by mouth every 6 (six) hours as needed for nausea or vomiting. 30 tablet 2  . vitamin B-12 1000 MCG tablet Take 1 tablet (1,000 mcg total) by mouth daily. 30 tablet 0   No current facility-administered medications for this visit.     PHYSICAL EXAMINATION: ECOG PERFORMANCE STATUS: 2 - Symptomatic, <50% confined to bed  Vitals:   07/08/18 0837  BP: 127/83  Pulse: 82  Resp: 18  Temp: 98.3 F (36.8 C)  SpO2: 100%   Filed Weights   07/08/18 0837  Weight: 150 lb 3.2 oz (68.1  kg)    GENERAL:alert, no distress and comfortable SKIN: skin color, texture, turgor are normal, no rashes or significant lesions EYES: normal, Conjunctiva are pink and non-injected, sclera clear OROPHARYNX:no exudate, no erythema and lips, buccal mucosa, and tongue normal  NECK: supple, thyroid normal size, non-tender, without nodularity LYMPH:  no palpable lymphadenopathy in the cervical, axillary or inguinal LUNGS: clear to auscultation and percussion with normal breathing effort HEART: regular rate & rhythm and no murmurs and no lower extremity edema ABDOMEN:abdomen soft, non-tender and normal bowel sounds Musculoskeletal:no cyanosis of digits and no clubbing  NEURO: alert & oriented x 3 with fluent speech, no focal motor/sensory deficits  LABORATORY DATA:  I have reviewed the data as listed CBC Latest Ref Rng & Units 07/08/2018 06/24/2018 06/10/2018  WBC 4.0 - 10.5 K/uL 13.7(H) 17.7(H) 5.4  Hemoglobin 12.0 - 15.0 g/dL 11.3(L) 11.2(L) 11.3(L)  Hematocrit 36.0 - 46.0 % 33.7(L) 33.8(L) 34.0(L)  Platelets 150 - 400 K/uL 179 108(L) 249     CMP Latest Ref Rng & Units 06/24/2018 06/10/2018 05/27/2018  Glucose 70 - 99 mg/dL 181(H) 97 175(H)  BUN 6 - 20 mg/dL 9 8 14   Creatinine 0.44 - 1.00 mg/dL 0.82 0.72 0.78  Sodium 135 - 145 mmol/L 137 140 140  Potassium 3.5 - 5.1 mmol/L 3.9 3.8 4.3  Chloride 98 - 111 mmol/L 102 103 105  CO2 22 - 32 mmol/L 23 27 24   Calcium 8.9 - 10.3 mg/dL 9.4 9.1 9.3  Total Protein 6.5 - 8.1 g/dL 7.7 7.5 7.0  Total Bilirubin 0.3 - 1.2 mg/dL 0.3 0.3 0.3  Alkaline Phos 38 - 126 U/L 100 80 97  AST 15 - 41 U/L 25 32 26  ALT 0 - 44 U/L 19 28 32      RADIOGRAPHIC STUDIES: I have personally reviewed the radiological images as listed and agreed with the findings in the report. No results found.   ASSESSMENT & PLAN:  Mariah Kemp is a 58 y.o. female with   1. 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 -She has seen surgeon Dr. Barry Dienes, and underwent exploratory laparoscope which was negative for peritoneal metastasis. -Given her locally advanced disease, she started neo-adjuvant chemoFLOT4 every 2 weeks for 4 monthson 04/15/18.She is toleratingmoderately well with dry skin, taste change, appetite loss with mild-moderate malnutrition and moderate diarrhea. Currently well managed and stable.  -She will continue Protonix. -She did not tolerate pump well with cycle 6. She experienced fever and chills on day 1 at night along with persistent taste change, and weight loss. She stated her pump infusion finished  2 hours earlier than scheduled. I recommend if she develops a fever again she can take Tylenol as needed. I discussed side effects will build more as she continues treatment. I may also obtain a culture to rule out infection.  -Labs reviewed, CBC WNL except WBC 13.7, Hg 11.3, ANC 11.4, CMP pending. Ferritin and Ca19.9 still pending. Overall adequate to proceed with cycle 7 today with pump over 26 hours. Total of 8 cycles is planned for total neoadjuvant chemotherapy before proceeding with surgery.  -F/u in 2 weeks  -Plan to repeat restaging scan after cycle 8 therapy, and she will see Dr. Barry Dienes after the scan   2. Anemia, iron deficient from chronic GI bleeding. -Secondary to GI blood loss  -Given 3u blood transfusion on 04/01/18, responded moderately well.  -She is s/p 2 doses of IV iron in 03/2018, she responded moderately wellwith improved anemia. -Hemoglobin11.3today(07/08/18), Ferritin still pending.Will continue monitoring  3. H/o stage 3 right breast cancer in 2007 -Treated with right mastectomy, reconstruction with 3 LN removed. She underwent chemotherapy for 5 months, likely AC-T. She completed Tamoxifen for at least 5 years.   4. Genetic  -I discussed her eligibility for genetic testing. She has personal history of breast and gastric cancer. She  waspreviouslyreferred. She has not been seen yet, will check with genetics department   5. Hypokalemia -She will continue potassium chloride 20 meq daily -K pendingtoday (07/08/18/20)   6. HTN andhypothyroidism -We will hold hydrochlorothiazide for now during the chemo -HTN well controlled  -Continue to follow-up with PCP  7. Diarrhea, Low appetite, weight loss, Mild to moderate Malnutrition -She had poor appetite with 4th cycle due to taste change resulting in continued weight loss -She also had an increase in diarrhea. Moderately controlled with 2 imodium at first loose BM. She is fine to increase imodium if needed. Stable.  -Her taste is gone during most of her treatment and meat and carbohydrates due not agree with her stomach. She mainly eats soups and drinks adequate amount of water. She will continue ensure boost and try carnation breakfast.  -She still has taste change which has lead to continued weight loss. She will increase ensure boost daily.  -continue to Follow up with Warnell Forester.  PLAN: -I refilled Zofran today  -Lab reviewed, adequate for treatment, will proceed to cycle7FLOT4 today with pump over 26 hours, Udenyca on pump d/c  -Lab, flush, f/u and FLOT4 in 2 weeks (last cycle) -I will send a message to Dr. Barry Dienes for her f/u appointment     No problem-specific Assessment & Plan notes found for this encounter.   No orders of the defined types were placed in this encounter.  All questions were answered. The patient knows to call the clinic with any problems, questions or concerns. No barriers to learning was detected. I spent 20 minutes counseling the patient face to face. The total time spent in the appointment was 25 minutes and more than 50% was on counseling and review of test results     [name], MD 07/08/2018   I, Joslyn Devon, am acting as scribe for [name], MD.   I have reviewed the above documentation for accuracy and  completeness, and I agree with the above.

## 2018-07-08 ENCOUNTER — Inpatient Hospital Stay: Payer: 59

## 2018-07-08 ENCOUNTER — Telehealth: Payer: Self-pay | Admitting: Hematology

## 2018-07-08 ENCOUNTER — Encounter: Payer: Self-pay | Admitting: Hematology

## 2018-07-08 ENCOUNTER — Inpatient Hospital Stay (HOSPITAL_BASED_OUTPATIENT_CLINIC_OR_DEPARTMENT_OTHER): Payer: 59 | Admitting: Hematology

## 2018-07-08 ENCOUNTER — Other Ambulatory Visit: Payer: Self-pay

## 2018-07-08 VITALS — BP 127/83 | HR 82 | Temp 98.3°F | Resp 18 | Ht 61.0 in | Wt 150.2 lb

## 2018-07-08 DIAGNOSIS — C169 Malignant neoplasm of stomach, unspecified: Secondary | ICD-10-CM

## 2018-07-08 DIAGNOSIS — G629 Polyneuropathy, unspecified: Secondary | ICD-10-CM

## 2018-07-08 DIAGNOSIS — R634 Abnormal weight loss: Secondary | ICD-10-CM

## 2018-07-08 DIAGNOSIS — R630 Anorexia: Secondary | ICD-10-CM

## 2018-07-08 DIAGNOSIS — K922 Gastrointestinal hemorrhage, unspecified: Secondary | ICD-10-CM

## 2018-07-08 DIAGNOSIS — E46 Unspecified protein-calorie malnutrition: Secondary | ICD-10-CM

## 2018-07-08 DIAGNOSIS — Z5111 Encounter for antineoplastic chemotherapy: Secondary | ICD-10-CM | POA: Diagnosis not present

## 2018-07-08 DIAGNOSIS — Z853 Personal history of malignant neoplasm of breast: Secondary | ICD-10-CM

## 2018-07-08 DIAGNOSIS — E876 Hypokalemia: Secondary | ICD-10-CM

## 2018-07-08 DIAGNOSIS — I10 Essential (primary) hypertension: Secondary | ICD-10-CM

## 2018-07-08 DIAGNOSIS — E039 Hypothyroidism, unspecified: Secondary | ICD-10-CM

## 2018-07-08 DIAGNOSIS — C162 Malignant neoplasm of body of stomach: Secondary | ICD-10-CM | POA: Diagnosis not present

## 2018-07-08 DIAGNOSIS — D500 Iron deficiency anemia secondary to blood loss (chronic): Secondary | ICD-10-CM | POA: Diagnosis not present

## 2018-07-08 DIAGNOSIS — Z9011 Acquired absence of right breast and nipple: Secondary | ICD-10-CM

## 2018-07-08 DIAGNOSIS — Z95828 Presence of other vascular implants and grafts: Secondary | ICD-10-CM

## 2018-07-08 LAB — CMP (CANCER CENTER ONLY)
ALT: 21 U/L (ref 0–44)
AST: 27 U/L (ref 15–41)
Albumin: 3.6 g/dL (ref 3.5–5.0)
Alkaline Phosphatase: 105 U/L (ref 38–126)
Anion gap: 10 (ref 5–15)
BUN: 12 mg/dL (ref 6–20)
CO2: 25 mmol/L (ref 22–32)
Calcium: 9.3 mg/dL (ref 8.9–10.3)
Chloride: 102 mmol/L (ref 98–111)
Creatinine: 0.71 mg/dL (ref 0.44–1.00)
GFR, Est AFR Am: >60 mL/min
GFR, Estimated: >60 mL/min
Glucose, Bld: 118 mg/dL — ABNORMAL HIGH (ref 70–99)
Potassium: 4 mmol/L (ref 3.5–5.1)
Sodium: 137 mmol/L (ref 135–145)
Total Bilirubin: 0.3 mg/dL (ref 0.3–1.2)
Total Protein: 8.1 g/dL (ref 6.5–8.1)

## 2018-07-08 LAB — CBC WITH DIFFERENTIAL (CANCER CENTER ONLY)
Abs Immature Granulocytes: 0.29 K/uL — ABNORMAL HIGH (ref 0.00–0.07)
Basophils Absolute: 0 K/uL (ref 0.0–0.1)
Basophils Relative: 0 %
Eosinophils Absolute: 0 K/uL (ref 0.0–0.5)
Eosinophils Relative: 0 %
HCT: 33.7 % — ABNORMAL LOW (ref 36.0–46.0)
Hemoglobin: 11.3 g/dL — ABNORMAL LOW (ref 12.0–15.0)
Immature Granulocytes: 2 %
Lymphocytes Relative: 9 %
Lymphs Abs: 1.3 K/uL (ref 0.7–4.0)
MCH: 33.4 pg (ref 26.0–34.0)
MCHC: 33.5 g/dL (ref 30.0–36.0)
MCV: 99.7 fL (ref 80.0–100.0)
Monocytes Absolute: 0.7 K/uL (ref 0.1–1.0)
Monocytes Relative: 5 %
Neutro Abs: 11.4 K/uL — ABNORMAL HIGH (ref 1.7–7.7)
Neutrophils Relative %: 84 %
Platelet Count: 179 K/uL (ref 150–400)
RBC: 3.38 MIL/uL — ABNORMAL LOW (ref 3.87–5.11)
RDW: 14.9 % (ref 11.5–15.5)
WBC Count: 13.7 K/uL — ABNORMAL HIGH (ref 4.0–10.5)
nRBC: 0 % (ref 0.0–0.2)

## 2018-07-08 LAB — SAMPLE TO BLOOD BANK

## 2018-07-08 LAB — FERRITIN: Ferritin: 862 ng/mL — ABNORMAL HIGH (ref 11–307)

## 2018-07-08 MED ORDER — DEXTROSE 5 % IV SOLN
INTRAVENOUS | Status: DC
  Administered 2018-07-08: 09:00:00 via INTRAVENOUS
  Filled 2018-07-08: qty 250

## 2018-07-08 MED ORDER — DEXAMETHASONE SODIUM PHOSPHATE 10 MG/ML IJ SOLN
INTRAMUSCULAR | Status: AC
  Filled 2018-07-08: qty 1

## 2018-07-08 MED ORDER — DEXAMETHASONE SODIUM PHOSPHATE 10 MG/ML IJ SOLN
10.0000 mg | Freq: Once | INTRAMUSCULAR | Status: AC
  Administered 2018-07-08: 10 mg via INTRAVENOUS

## 2018-07-08 MED ORDER — SODIUM CHLORIDE 0.9 % IV SOLN
50.0000 mg/m2 | Freq: Once | INTRAVENOUS | Status: AC
  Administered 2018-07-08: 90 mg via INTRAVENOUS
  Filled 2018-07-08: qty 9

## 2018-07-08 MED ORDER — SODIUM CHLORIDE 0.9% FLUSH
10.0000 mL | Freq: Once | INTRAVENOUS | Status: AC
  Administered 2018-07-08: 10 mL
  Filled 2018-07-08: qty 10

## 2018-07-08 MED ORDER — LEUCOVORIN CALCIUM INJECTION 350 MG
200.0000 mg/m2 | Freq: Once | INTRAVENOUS | Status: AC
  Administered 2018-07-08: 358 mg via INTRAVENOUS
  Filled 2018-07-08: qty 17.9

## 2018-07-08 MED ORDER — SODIUM CHLORIDE 0.9 % IV SOLN
INTRAVENOUS | Status: DC
  Administered 2018-07-08: 10:00:00 via INTRAVENOUS
  Filled 2018-07-08: qty 250

## 2018-07-08 MED ORDER — OXALIPLATIN CHEMO INJECTION 100 MG/20ML
85.0000 mg/m2 | Freq: Once | INTRAVENOUS | Status: AC
  Administered 2018-07-08: 150 mg via INTRAVENOUS
  Filled 2018-07-08: qty 10

## 2018-07-08 MED ORDER — DEXTROSE 5 % IV SOLN
Freq: Once | INTRAVENOUS | Status: AC
  Administered 2018-07-08: 12:00:00 via INTRAVENOUS
  Filled 2018-07-08: qty 250

## 2018-07-08 MED ORDER — PALONOSETRON HCL INJECTION 0.25 MG/5ML
0.2500 mg | Freq: Once | INTRAVENOUS | Status: AC
  Administered 2018-07-08: 0.25 mg via INTRAVENOUS

## 2018-07-08 MED ORDER — ONDANSETRON HCL 8 MG PO TABS: 8.0000 mg | ORAL_TABLET | Freq: Three times a day (TID) | ORAL | 1 refills | Status: DC | PRN

## 2018-07-08 MED ORDER — SODIUM CHLORIDE 0.9 % IV SOLN
2600.0000 mg/m2 | INTRAVENOUS | Status: DC
  Administered 2018-07-08: 4650 mg via INTRAVENOUS
  Filled 2018-07-08: qty 93

## 2018-07-08 MED ORDER — PALONOSETRON HCL INJECTION 0.25 MG/5ML
INTRAVENOUS | Status: AC
  Filled 2018-07-08: qty 5

## 2018-07-08 NOTE — Telephone Encounter (Signed)
No los per 4/22. °

## 2018-07-08 NOTE — Patient Instructions (Signed)
Coronavirus (COVID-19) Are you at risk?  Are you at risk for the Coronavirus (COVID-19)?  To be considered HIGH RISK for Coronavirus (COVID-19), you have to meet the following criteria:  . Traveled to [place], [place], [place], [place] or [place]; or in the [place] to [place], [place], [place], or [place]; and have fever, cough, and shortness of breath within the last 2 weeks of travel OR . Been in close contact with a person diagnosed with COVID-19 within the last 2 weeks and have fever, cough, and shortness of breath . IF YOU DO NOT MEET THESE CRITERIA, YOU ARE CONSIDERED LOW RISK FOR COVID-19.  What to do if you are HIGH RISK for COVID-19?  . If you are having a medical emergency, call 911. . Seek medical care right away. Before you go to a doctor's office, urgent care or emergency department, call ahead and tell them about your recent travel, contact with someone diagnosed with COVID-19, and your symptoms. You should receive instructions from your physician's office regarding next steps of care.  . When you arrive at healthcare provider, tell the healthcare staff immediately you have returned from visiting [place], [place], [place], [place] or [place]; or traveled in the [place] to [place], [place], [place], or [place]; in the last two weeks or you have been in close contact with a person diagnosed with COVID-19 in the last 2 weeks.   . Tell the health care staff about your symptoms: fever, cough and shortness of breath. . After you have been seen by a medical provider, you will be either: o Tested for (COVID-19) and discharged home on quarantine except to seek medical care if symptoms worsen, and asked to  - Stay home and avoid contact with others until you get your results (4-5 days)  - Avoid travel on public transportation if possible (such as bus, train, or airplane) or o Sent to the Emergency Department by EMS for evaluation, COVID-19 testing, and possible  admission depending on your condition and test results.  What to do if you are LOW RISK for COVID-19?  Reduce your risk of any infection by using the same precautions used for avoiding the common cold or flu:  . Wash your hands often with soap and warm water for at least 20 seconds.  If soap and water are not readily available, use an alcohol-based hand sanitizer with at least 60% alcohol.  . If coughing or sneezing, cover your mouth and nose by coughing or sneezing into the elbow areas of your shirt or coat, into a tissue or into your sleeve (not your hands). . Avoid shaking hands with others and consider head nods or verbal greetings only. . Avoid touching your eyes, nose, or mouth with unwashed hands.  . Avoid close contact with people who are sick. . Avoid places or events with large numbers of people in one location, like concerts or sporting events. . Carefully consider travel plans you have or are making. . If you are planning any travel outside or inside the [place], visit the CDC's Travelers' Health webpage for the latest health notices. . If you have some symptoms but not all symptoms, continue to monitor at home and seek medical attention if your symptoms worsen. . If you are having a medical emergency, call 911.   ADDITIONAL HEALTHCARE OPTIONS FOR PATIENTS  [facility] / e-Visit: [email or web address]         [facility]: [phone]  Onset   Urgent Care: [facility]: [facility] Discharge Instructions for Patients Receiving Chemotherapy  Today you received the following chemotherapy agents: Docetaxel (Taxotere), Oxaliplatin (Eloxatin), Leucovorin, Fluorouracil (Adrucil, 5-FU)  To help prevent nausea and vomiting after your treatment, we encourage you to take your nausea medication as directed.    If you develop nausea and vomiting  that is not controlled by your nausea medication, call the clinic.   BELOW ARE SYMPTOMS THAT SHOULD BE REPORTED IMMEDIATELY:  *FEVER GREATER THAN 100.5 F  *CHILLS WITH OR WITHOUT FEVER  NAUSEA AND VOMITING THAT IS NOT CONTROLLED WITH YOUR NAUSEA MEDICATION  *UNUSUAL SHORTNESS OF BREATH  *UNUSUAL BRUISING OR BLEEDING  TENDERNESS IN MOUTH AND THROAT WITH OR WITHOUT PRESENCE OF ULCERS  *URINARY PROBLEMS  *BOWEL PROBLEMS  UNUSUAL RASH Items with * indicate a potential emergency and should be followed up as soon as possible.  Feel free to call the clinic should you have any questions or concerns. The clinic phone number is [phone].  Please show the [facility] at check-in to the Emergency Department and triage nurse.

## 2018-07-09 ENCOUNTER — Inpatient Hospital Stay: Payer: 59

## 2018-07-09 ENCOUNTER — Other Ambulatory Visit: Payer: Self-pay

## 2018-07-09 ENCOUNTER — Telehealth: Payer: Self-pay | Admitting: *Deleted

## 2018-07-09 ENCOUNTER — Ambulatory Visit: Payer: 59

## 2018-07-09 VITALS — BP 109/63 | HR 85 | Temp 98.9°F | Resp 18

## 2018-07-09 DIAGNOSIS — C162 Malignant neoplasm of body of stomach: Secondary | ICD-10-CM

## 2018-07-09 DIAGNOSIS — Z5111 Encounter for antineoplastic chemotherapy: Secondary | ICD-10-CM | POA: Diagnosis not present

## 2018-07-09 LAB — CANCER ANTIGEN 19-9: CA 19-9: 19 U/mL (ref 0–35)

## 2018-07-09 MED ORDER — HEPARIN SOD (PORK) LOCK FLUSH 100 UNIT/ML IV SOLN
500.0000 [IU] | Freq: Once | INTRAVENOUS | Status: AC | PRN
  Administered 2018-07-09: 500 [IU]
  Filled 2018-07-09: qty 5

## 2018-07-09 MED ORDER — PEGFILGRASTIM-CBQV 6 MG/0.6ML ~~LOC~~ SOSY
PREFILLED_SYRINGE | SUBCUTANEOUS | Status: AC
  Filled 2018-07-09: qty 0.6

## 2018-07-09 MED ORDER — SODIUM CHLORIDE 0.9% FLUSH
10.0000 mL | INTRAVENOUS | Status: DC | PRN
  Administered 2018-07-09: 10 mL
  Filled 2018-07-09: qty 10

## 2018-07-09 MED ORDER — PEGFILGRASTIM-CBQV 6 MG/0.6ML ~~LOC~~ SOSY
6.0000 mg | PREFILLED_SYRINGE | Freq: Once | SUBCUTANEOUS | Status: AC
  Administered 2018-07-09: 6 mg via SUBCUTANEOUS

## 2018-07-20 ENCOUNTER — Inpatient Hospital Stay: Payer: 59 | Attending: Hematology | Admitting: Nutrition

## 2018-07-20 DIAGNOSIS — Z5111 Encounter for antineoplastic chemotherapy: Secondary | ICD-10-CM | POA: Insufficient documentation

## 2018-07-20 DIAGNOSIS — Z5189 Encounter for other specified aftercare: Secondary | ICD-10-CM | POA: Insufficient documentation

## 2018-07-20 DIAGNOSIS — C162 Malignant neoplasm of body of stomach: Secondary | ICD-10-CM | POA: Insufficient documentation

## 2018-07-20 DIAGNOSIS — D500 Iron deficiency anemia secondary to blood loss (chronic): Secondary | ICD-10-CM | POA: Insufficient documentation

## 2018-07-20 NOTE — Progress Notes (Signed)
RD working remotely.  Patient cancelled her nutrition appointment. Contacted patient and rescheduled nutrition appointment for Wednesday during her infusion. I will contact her by phone.

## 2018-07-21 NOTE — Progress Notes (Signed)
Mariah Kemp   Telephone:([phone]) [phone] [phone]) [phone]   Clinic Follow up Note   Patient Care Team: Emeterio Reeve, DO as PCP - General (Osteopathic Medicine) 07/22/2018  CHIEF COMPLAINT: f/u gastric cancer   SUMMARY OF ONCOLOGIC HISTORY: Oncology History   Cancer Staging Gastric cancer [facility]) Staging form: Stomach, AJCC 8th Edition - Clinical stage from 03/29/2018: Stage IVA (cT4b, cN1, cM0) - Signed by [name], MD on 04/03/2018       Gastric cancer ([place])   03/19/2018 Procedure    Upper Endoscopy by Dr. Michail Sermon 03/29/18  IMPRESSION - Normal esophagus. - Z-line regular, 38 cm from the incisors. - Non-obstructing oozing gastric ulcer with pigmented material. Biopsied. - Normal examined duodenum. - Acute gastritis.    03/29/2018 Procedure    Colonoscopy by Dr. Michail Sermon 03/29/18 IMPRESSION - Preparation of the colon was fair. - Internal hemorrhoids. - The examined portion of the ileum was normal. - No specimens collected.    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 - SEE COMMENT. Microscopic Comment A Warthin Starry stain is negative for the presence of Helicobacter pylori organisms. Dr Vic Ripper has reviewed the case and concurs with this interpretation. Dr Michail Sermon was paged on 03/31/2018. (JBK:ecj 03/31/2018)    03/29/2018 Cancer Staging    Staging form: Stomach, AJCC 8th Edition - Clinical stage from 03/29/2018: Stage IVA (cT4b, cN1, cM0) - Signed by [name], MD on 04/03/2018    04/02/2018 Initial Diagnosis    Gastric cancer ([place])    04/02/2018 Imaging    CT CAP W Contrast 04/02/18 IMPRESSION: 1. Focal thickening lateral wall proximal stomach with protrusion of low-attenuation soft tissue beyond the expected confines of the gastric wall into the splenic hilum. Imaging features highly concerning for transmural tumor extension into the splenic hilum. Several small nodules  in this region are likely lymph nodes, concerning for metastatic disease. 2. Scattered bilateral pulmonary nodules measuring up to 5 mm. Nonspecific, but close attention on follow-up recommended as metastatic disease not excluded. 3. Small lymph nodes in the gastrohepatic ligament, but there is no gastrohepatic or hepato duodenal ligament lymphadenopathy. No evidence for liver metastases. 4.  Aortic Atherosclerois (ICD10-170.0    04/15/2018 -  Chemotherapy    FLOT4 every 2 weeks starting 04/15/18. Plan to complete 07/22/18.     CURRENT THERAPY:  NeoadjuvantFLOT4 every 2 weeks starting 04/15/18. Plan to complete 07/22/18  INTERVAL HISTORY: Mariah Kemp returns for follow up as scheduled. She completed cycle 7 FLOT4 on 07/08/18. She tolerated well overall, but notes chemo is "getting tougher." She has 2 days of feeling "down" with fatigue which then improves. She remains independent and functions well. Appetite is low with taste changes. She is not interested in food. Drinks 2 supplements per day. Denies mucositis. She has 2 days of constipation then 2 days of diarrhea after treatment, controls with miralax and imodium respectively as needed. Denies blood in stool. Denies abdominal pain. Cold sensitivity lasted 2 weeks. Denies fever, chills, cough, chest pain, dyspnea, leg swelling.   REVIEW OF SYSTEMS:   Constitutional: Denies fevers, chills (+) abnormal weight loss (+) decreased appetite and taste (+) fatigue x2 days after chemo Ears, nose, mouth, throat, and face: Denies mucositis or sore throat Respiratory: Denies cough, dyspnea or wheezes Cardiovascular: Denies palpitation, chest discomfort or lower extremity swelling Gastrointestinal:  Denies nausea, vomiting, GI bleeding, abdominal pain, heartburn (+) constipation and diarrhea after chemo Skin: Denies abnormal skin rashes Lymphatics:  Denies new lymphadenopathy or easy bruising Neurological:Denies numbness, tingling or new weaknesses (+) cold  sensitivity x2 days  Behavioral/Psych: Mood is stable, no new changes  All other systems were reviewed with the patient and are negative.  MEDICAL HISTORY:  Past Medical History:  Diagnosis Date  . Cancer ([place])   . Hypertension   . Thyroid disease     SURGICAL HISTORY: Past Surgical History:  Procedure Laterality Date  . ABDOMINAL HYSTERECTOMY  1996  . BILATERAL SALPINGOOPHORECTOMY  2007  . BIOPSY  03/29/2018   Procedure: BIOPSY;  Surgeon: Wilford Corner, MD;  Location: [place];  Service: Endoscopy;;  . CARPAL TUNNEL RELEASE    . COLONOSCOPY N/A 03/29/2018   Procedure: COLONOSCOPY;  Surgeon: Wilford Corner, MD;  Location: [facility] ENDOSCOPY;  Service: Endoscopy;  Laterality: N/A;  . ESOPHAGOGASTRODUODENOSCOPY N/A 03/29/2018   Procedure: ESOPHAGOGASTRODUODENOSCOPY (EGD);  Surgeon: Wilford Corner, MD;  Location: [facility];  Service: Endoscopy;  Laterality: N/A;  . LAPAROSCOPY N/A 04/13/2018   Procedure: LAPAROSCOPY DIAGNOSTIC;  Surgeon: [name], MD;  Location: [facility];  Service: General;  Laterality: N/A;  . MASTECTOMY  2007  . PORTACATH PLACEMENT Left 04/13/2018   Procedure: INSERTION PORT-A-CATH ERAS PATHWAY;  Surgeon: [name], MD;  Location: [facility];  Service: General;  Laterality: Left;  subclavian    I have reviewed the social history and family history with the patient and they are unchanged from previous note.  ALLERGIES:  has No Known Allergies.  MEDICATIONS:  Current Outpatient Medications  Medication Sig Dispense Refill  . acyclovir (ZOVIRAX) 400 MG tablet Take 1 tablet (400 mg total) by mouth 3 (three) times daily. For five days. As needed for cold sore. 15 tablet 0  . albuterol (PROVENTIL HFA;VENTOLIN HFA) 108 (90 Base) MCG/ACT inhaler Inhale 1-2 puffs into the lungs every 4 (four) hours as needed for wheezing or shortness of breath. 2 Inhaler 3  . budesonide-formoterol (SYMBICORT) 160-4.5 MCG/ACT inhaler Inhale 2 puffs into the lungs 2 (two) times  daily. (Patient taking differently: Inhale 2 puffs into the lungs 2 (two) times daily as needed (respiratory issues.). ) 2 Inhaler 3  . dexamethasone (DECADRON) 4 MG tablet Take 1 tablet (4 mg total) by mouth 2 (two) times daily with a meal. 30 tablet 2  . docusate sodium (COLACE) 100 MG capsule Take 100 mg by mouth 2 (two) times daily.    . ferrous sulfate 325 (65 FE) MG tablet Take 1 tablet (325 mg total) by mouth 2 (two) times daily with a meal. 90 tablet 0  . hydrochlorothiazide (HYDRODIURIL) 25 MG tablet Take 1 tablet (25 mg total) by mouth daily. 30 tablet 0  . KLOR-CON M20 20 MEQ tablet TAKE 1 TABLET BY MOUTH 2 TIMES DAILY X 3 DAYS, THEN ONE DAILY THEREAFTER. 36 tablet 1  . levothyroxine (SYNTHROID, LEVOTHROID) 150 MCG tablet Take 1 tablet (150 mcg total) by mouth daily before breakfast. 90 tablet 3  . loperamide (IMODIUM A-D) 2 MG tablet Take 2 mg by mouth 4 (four) times daily as needed for diarrhea or loose stools.    . ondansetron (ZOFRAN) 8 MG tablet Take 1 tablet (8 mg total) by mouth every 8 (eight) hours as needed for nausea or vomiting. 30 tablet 1  . oxyCODONE (OXY IR/ROXICODONE) 5 MG immediate release tablet Take 1 tablet (5 mg total) by mouth every 6 (six) hours as needed for severe pain. 10 tablet 0  . Prenatal Vit-Fe Fumarate-FA (PRENATAL MULTIVITAMIN) TABS tablet Take 1 tablet by mouth daily  at 12 noon.    . prochlorperazine (COMPAZINE) 10 MG tablet Take 1 tablet (10 mg total) by mouth every 6 (six) hours as needed for nausea or vomiting. 30 tablet 2  . vitamin B-12 1000 MCG tablet Take 1 tablet (1,000 mcg total) by mouth daily. 30 tablet 0  . pantoprazole (PROTONIX) 40 MG tablet Take 1 tablet (40 mg total) by mouth daily for 30 days. 30 tablet 3   No current facility-administered medications for this visit.    Facility-Administered Medications Ordered in Other Visits  Medication Dose Route Frequency Provider Last Rate Last Dose  . 0.9 %  sodium chloride infusion   Intravenous  Continuous [name], MD 20 mL/hr at 07/22/18 1105    . dextrose 5 % solution   Intravenous Continuous [name], MD      . dextrose 5 % solution   Intravenous Once [name], MD      . DOCEtaxel (TAXOTERE) 90 mg in sodium chloride 0.9 % 250 mL chemo infusion  50 mg/m2 (Treatment Plan Recorded) Intravenous Once [name], MD      . fluorouracil (ADRUCIL) 4,650 mg in sodium chloride 0.9 % 57 mL chemo infusion  2,600 mg/m2 (Treatment Plan Recorded) Intravenous 1 day or 1 dose [name], MD      . heparin lock flush 100 unit/mL  500 Units Intracatheter Once PRN [name], MD      . leucovorin 358 mg in dextrose 5 % 250 mL infusion  200 mg/m2 (Treatment Plan Recorded) Intravenous Once [name], MD      . oxaliplatin (ELOXATIN) 150 mg in dextrose 5 % 500 mL chemo infusion  85 mg/m2 (Treatment Plan Recorded) Intravenous Once [name], MD      . sodium chloride flush (NS) 0.9 % injection 10 mL  10 mL Intracatheter PRN [name], MD        PHYSICAL EXAMINATION: ECOG PERFORMANCE STATUS: 1 - Symptomatic but completely ambulatory  Vitals:   07/22/18 1010  BP: 125/86  Pulse: 94  Resp: 18  Temp: 98 F (36.7 C)  SpO2: 100%   Filed Weights   07/22/18 1010  Weight: 144 lb 9.6 oz (65.6 kg)    GENERAL:alert, no distress and comfortable SKIN: no rashes or significant lesions EYES: sclera clear OROPHARYNX:no thrush or ulcers LYMPH:  no palpable cervical or supraclavicular lymphadenopathy  LUNGS:  with normal breathing effort HEART:  no lower extremity edema ABDOMEN:abdomen soft, non-tender  Musculoskeletal:no cyanosis of digits and no clubbing  NEURO: alert & oriented x 3 with fluent speech PAC without erythema   LABORATORY DATA:  I have reviewed the data as listed CBC Latest Ref Rng & Units 07/22/2018 07/08/2018 06/24/2018  WBC 4.0 - 10.5 K/uL 10.6(H) 13.7(H) 17.7(H)  Hemoglobin 12.0 - 15.0 g/dL 10.7(L) 11.3(L) 11.2(L)  Hematocrit 36.0 - 46.0 % 32.6(L) 33.7(L) 33.8(L)  Platelets 150 - 400  K/uL 124(L) 179 108(L)     CMP Latest Ref Rng & Units 07/22/2018 07/08/2018 06/24/2018  Glucose 70 - 99 mg/dL 105(H) 118(H) 181(H)  BUN 6 - 20 mg/dL 9 12 9   Creatinine 0.44 - 1.00 mg/dL 0.70 0.71 0.82  Sodium 135 - 145 mmol/L 141 137 137  Potassium 3.5 - 5.1 mmol/L 3.7 4.0 3.9  Chloride 98 - 111 mmol/L 105 102 102  CO2 22 - 32 mmol/L 26 25 23   Calcium 8.9 - 10.3 mg/dL 8.9 9.3 9.4  Total Protein 6.5 - 8.1 g/dL 7.4 8.1 7.7  Total Bilirubin 0.3 -  1.2 mg/dL 0.2(L) 0.3 0.3  Alkaline Phos 38 - 126 U/L 99 105 100  AST 15 - 41 U/L 28 27 25   ALT 0 - 44 U/L 18 21 19       RADIOGRAPHIC STUDIES: I have personally reviewed the radiological images as listed and agreed with the findings in the report. No results found.   ASSESSMENT & PLAN: Mariah Kemp is a 58 y.o. female with   1. 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 -s/p exploratory laparoscopy per Dr. Barry Dienes which was negative for peritoneal metastasis. -For locally advanced disease, Dr. Burr Medico recommended neo-adjuvant chemoFLOT4 every 2 weeks for 4 months, she beganon 04/15/18. -Ms. Bidwell appears stable, she continues to tolerate chemo moderately well overall, with mild fatigue, constipation then diarrhea, and cold sensitivity. She has recovered well from last cycle well. -Labs reviewed, adequate for cycle 8. VSS. Weight down 5 lbs from last cycle. She is followed by dietician. I encouraged her to drink more supplements on days she eats less. She agrees.  -Today with cycle 8 she will complete planned course of neoadjuvant chemotherapy. She will undergo restaging and f/u with [place] and Dr. Barry Dienes after.  -she had scattered small nonspecific pulmonary nodules on her initial work up, close f/u was recommended as metastatic disease was not excluded. Will include CT chest.  -I referred her back to Dr. Barry Dienes  -plan to see her back in 1 month to review CT   2. Anemia, iron  deficient from chronic GI bleeding. -Secondary to GI blood loss; she responded moderately well to RBC and IV iron  -Hgb stable, 10.7 today. She does not require blood transfusion.  -Ferritin is pending   3. H/o stage 3 right breast cancer in 2007 -Treated with right mastectomy, reconstruction with 3 LN removed. She underwent chemotherapy for 5 months, likely AC-T. She completed Tamoxifen for at least 5 years.   4. Genetic  -She is eligible for genetic testing due to personal history of breast and gastric cancer. She has been referred. She is worried about financials and does not want to pursue genetic testing if there is a fee. I will f/u with genetic counselor.   5. Hypokalemia -On potassium chloride 20 meq daily  6. HTN andhypothyroidism -holding hydrochlorothiazide for now during the chemo -Continue to follow-up with PCP  7. Diarrhea, Low appetite, weight loss, Mild to moderate Malnutrition -poor appetite, taste change, and weight loss secondary to chemotherapy  -continue to Follow up with Warnell Forester. -I encouraged her to increase nutrition supplement when she eats less.   PLAN: -Labs reviewed, ferritin pending -Proceed with final cycle 8 neoadjuvant FLOT4 -Restaging CT CAP in 4 weeks   -F/u in 4 weeks, few days after CT -Refer back to Dr. Barry Dienes for surgery, I sent her a message today -investigate cost of genetic testing    Orders Placed This Encounter  Procedures  . CT Abdomen Pelvis W Contrast    Standing Status:   Future    Standing Expiration Date:   07/22/2019    Order Specific Question:   If indicated for the ordered procedure, I authorize the administration of contrast media per Radiology protocol    Answer:   Yes    Order Specific Question:   Is patient pregnant?    Answer:   No    Order Specific Question:   Preferred imaging location?    Answer:   [facility]    Order Specific  Question:   Is Oral Contrast requested for this exam?     Answer:   Yes, Per Radiology protocol    Order Specific Question:   Radiology Contrast Protocol - do NOT remove file path    Answer:   \\[email or web address]  . CT Chest W Contrast    Standing Status:   Future    Standing Expiration Date:   07/22/2019    Order Specific Question:   If indicated for the ordered procedure, I authorize the administration of contrast media per Radiology protocol    Answer:   Yes    Order Specific Question:   Is patient pregnant?    Answer:   No    Order Specific Question:   Preferred imaging location?    Answer:   [facility]    Order Specific Question:   Radiology Contrast Protocol - do NOT remove file path    Answer:   \\[email or web address]   All questions were answered. The patient knows to call the clinic with any problems, questions or concerns. No barriers to learning was detected.     Alla Feeling, NP 07/22/18

## 2018-07-22 ENCOUNTER — Other Ambulatory Visit: Payer: Self-pay

## 2018-07-22 ENCOUNTER — Telehealth: Payer: Self-pay | Admitting: Nurse Practitioner

## 2018-07-22 ENCOUNTER — Inpatient Hospital Stay: Payer: 59 | Admitting: Nutrition

## 2018-07-22 ENCOUNTER — Inpatient Hospital Stay (HOSPITAL_BASED_OUTPATIENT_CLINIC_OR_DEPARTMENT_OTHER): Payer: 59 | Admitting: Nurse Practitioner

## 2018-07-22 ENCOUNTER — Inpatient Hospital Stay: Payer: 59

## 2018-07-22 ENCOUNTER — Encounter: Payer: Self-pay | Admitting: Nurse Practitioner

## 2018-07-22 VITALS — BP 125/86 | HR 94 | Temp 98.0°F | Resp 18 | Ht 61.0 in | Wt 144.6 lb

## 2018-07-22 DIAGNOSIS — R630 Anorexia: Secondary | ICD-10-CM

## 2018-07-22 DIAGNOSIS — I10 Essential (primary) hypertension: Secondary | ICD-10-CM

## 2018-07-22 DIAGNOSIS — Z5111 Encounter for antineoplastic chemotherapy: Secondary | ICD-10-CM | POA: Diagnosis present

## 2018-07-22 DIAGNOSIS — C169 Malignant neoplasm of stomach, unspecified: Secondary | ICD-10-CM

## 2018-07-22 DIAGNOSIS — D500 Iron deficiency anemia secondary to blood loss (chronic): Secondary | ICD-10-CM

## 2018-07-22 DIAGNOSIS — C162 Malignant neoplasm of body of stomach: Secondary | ICD-10-CM

## 2018-07-22 DIAGNOSIS — E039 Hypothyroidism, unspecified: Secondary | ICD-10-CM

## 2018-07-22 DIAGNOSIS — E876 Hypokalemia: Secondary | ICD-10-CM

## 2018-07-22 DIAGNOSIS — R197 Diarrhea, unspecified: Secondary | ICD-10-CM

## 2018-07-22 DIAGNOSIS — E46 Unspecified protein-calorie malnutrition: Secondary | ICD-10-CM

## 2018-07-22 DIAGNOSIS — Z95828 Presence of other vascular implants and grafts: Secondary | ICD-10-CM

## 2018-07-22 DIAGNOSIS — Z5189 Encounter for other specified aftercare: Secondary | ICD-10-CM | POA: Diagnosis not present

## 2018-07-22 DIAGNOSIS — R911 Solitary pulmonary nodule: Secondary | ICD-10-CM | POA: Diagnosis not present

## 2018-07-22 DIAGNOSIS — Z853 Personal history of malignant neoplasm of breast: Secondary | ICD-10-CM

## 2018-07-22 DIAGNOSIS — K922 Gastrointestinal hemorrhage, unspecified: Secondary | ICD-10-CM

## 2018-07-22 DIAGNOSIS — R634 Abnormal weight loss: Secondary | ICD-10-CM

## 2018-07-22 LAB — CBC WITH DIFFERENTIAL (CANCER CENTER ONLY)
Abs Immature Granulocytes: 0.22 10*3/uL — ABNORMAL HIGH (ref 0.00–0.07)
Basophils Absolute: 0.1 10*3/uL (ref 0.0–0.1)
Basophils Relative: 1 %
Eosinophils Absolute: 0 10*3/uL (ref 0.0–0.5)
Eosinophils Relative: 0 %
HCT: 32.6 % — ABNORMAL LOW (ref 36.0–46.0)
Hemoglobin: 10.7 g/dL — ABNORMAL LOW (ref 12.0–15.0)
Immature Granulocytes: 2 %
Lymphocytes Relative: 15 %
Lymphs Abs: 1.5 10*3/uL (ref 0.7–4.0)
MCH: 33.1 pg (ref 26.0–34.0)
MCHC: 32.8 g/dL (ref 30.0–36.0)
MCV: 100.9 fL — ABNORMAL HIGH (ref 80.0–100.0)
Monocytes Absolute: 0.8 10*3/uL (ref 0.1–1.0)
Monocytes Relative: 8 %
Neutro Abs: 7.9 10*3/uL — ABNORMAL HIGH (ref 1.7–7.7)
Neutrophils Relative %: 74 %
Platelet Count: 124 10*3/uL — ABNORMAL LOW (ref 150–400)
RBC: 3.23 MIL/uL — ABNORMAL LOW (ref 3.87–5.11)
RDW: 14 % (ref 11.5–15.5)
WBC Count: 10.6 10*3/uL — ABNORMAL HIGH (ref 4.0–10.5)
nRBC: 0 % (ref 0.0–0.2)

## 2018-07-22 LAB — CMP (CANCER CENTER ONLY)
ALT: 18 U/L (ref 0–44)
AST: 28 U/L (ref 15–41)
Albumin: 3.4 g/dL — ABNORMAL LOW (ref 3.5–5.0)
Alkaline Phosphatase: 99 U/L (ref 38–126)
Anion gap: 10 (ref 5–15)
BUN: 9 mg/dL (ref 6–20)
CO2: 26 mmol/L (ref 22–32)
Calcium: 8.9 mg/dL (ref 8.9–10.3)
Chloride: 105 mmol/L (ref 98–111)
Creatinine: 0.7 mg/dL (ref 0.44–1.00)
GFR, Est AFR Am: >60 mL/min (ref >60)
GFR, Estimated: >60 mL/min (ref >60)
Glucose, Bld: 105 mg/dL — ABNORMAL HIGH (ref 70–99)
Potassium: 3.7 mmol/L (ref 3.5–5.1)
Sodium: 141 mmol/L (ref 135–145)
Total Bilirubin: 0.2 mg/dL — ABNORMAL LOW (ref 0.3–1.2)
Total Protein: 7.4 g/dL (ref 6.5–8.1)

## 2018-07-22 LAB — SAMPLE TO BLOOD BANK

## 2018-07-22 LAB — FERRITIN: Ferritin: 12 ng/mL (ref 11–307)

## 2018-07-22 MED ORDER — DEXTROSE 5 % IV SOLN
Freq: Once | INTRAVENOUS | Status: AC
  Administered 2018-07-22: 13:00:00 via INTRAVENOUS
  Filled 2018-07-22: qty 250

## 2018-07-22 MED ORDER — DEXAMETHASONE SODIUM PHOSPHATE 10 MG/ML IJ SOLN
10.0000 mg | Freq: Once | INTRAMUSCULAR | Status: AC
  Administered 2018-07-22: 11:00:00 10 mg via INTRAVENOUS

## 2018-07-22 MED ORDER — LEUCOVORIN CALCIUM INJECTION 350 MG
200.0000 mg/m2 | Freq: Once | INTRAVENOUS | Status: AC
  Administered 2018-07-22: 358 mg via INTRAVENOUS
  Filled 2018-07-22: qty 17.9

## 2018-07-22 MED ORDER — SODIUM CHLORIDE 0.9% FLUSH
10.0000 mL | Freq: Once | INTRAVENOUS | Status: AC
  Administered 2018-07-22: 10 mL
  Filled 2018-07-22: qty 10

## 2018-07-22 MED ORDER — OXALIPLATIN CHEMO INJECTION 100 MG/20ML
85.0000 mg/m2 | Freq: Once | INTRAVENOUS | Status: AC
  Administered 2018-07-22: 150 mg via INTRAVENOUS
  Filled 2018-07-22: qty 20

## 2018-07-22 MED ORDER — PALONOSETRON HCL INJECTION 0.25 MG/5ML
INTRAVENOUS | Status: AC
  Filled 2018-07-22: qty 5

## 2018-07-22 MED ORDER — SODIUM CHLORIDE 0.9 % IV SOLN
50.0000 mg/m2 | Freq: Once | INTRAVENOUS | Status: AC
  Administered 2018-07-22: 12:00:00 90 mg via INTRAVENOUS
  Filled 2018-07-22: qty 9

## 2018-07-22 MED ORDER — HEPARIN SOD (PORK) LOCK FLUSH 100 UNIT/ML IV SOLN
500.0000 [IU] | Freq: Once | INTRAVENOUS | Status: DC | PRN
  Filled 2018-07-22: qty 5

## 2018-07-22 MED ORDER — SODIUM CHLORIDE 0.9% FLUSH
10.0000 mL | INTRAVENOUS | Status: DC | PRN
  Filled 2018-07-22: qty 10

## 2018-07-22 MED ORDER — SODIUM CHLORIDE 0.9 % IV SOLN
2600.0000 mg/m2 | INTRAVENOUS | Status: DC
  Administered 2018-07-22: 4650 mg via INTRAVENOUS
  Filled 2018-07-22: qty 93

## 2018-07-22 MED ORDER — PALONOSETRON HCL INJECTION 0.25 MG/5ML
0.2500 mg | Freq: Once | INTRAVENOUS | Status: AC
  Administered 2018-07-22: 0.25 mg via INTRAVENOUS

## 2018-07-22 MED ORDER — DEXTROSE 5 % IV SOLN
INTRAVENOUS | Status: DC
  Administered 2018-07-22: 13:00:00 via INTRAVENOUS
  Filled 2018-07-22: qty 250

## 2018-07-22 MED ORDER — SODIUM CHLORIDE 0.9 % IV SOLN
INTRAVENOUS | Status: DC
  Administered 2018-07-22: 11:00:00 via INTRAVENOUS
  Filled 2018-07-22: qty 250

## 2018-07-22 MED ORDER — DEXAMETHASONE SODIUM PHOSPHATE 10 MG/ML IJ SOLN
INTRAMUSCULAR | Status: AC
  Filled 2018-07-22: qty 1

## 2018-07-22 NOTE — Telephone Encounter (Signed)
Scheduled appt per 5/6 los.  Central radiology will contact patient about scan appt.

## 2018-07-22 NOTE — Progress Notes (Signed)
RD working remotely.  Nutrition follow-up completed with patient during infusion for gastric cancer. Weight continues to trend down and was documented as 144.6 pounds May 6.  This is decreased from 167 pounds January 29. Patient states that food has absolutely no taste and she has difficulty eating. She acknowledges that weight loss is problematic especially with upcoming surgery. She denies nausea and vomiting. Reports she has increased water intake. She is trying to drink Carnation breakfast essentials.  Nutrition diagnosis: Food and nutrition related knowledge deficit continues.  Intervention: Reviewed strategies for taste alterations. Encouraged patient to continue to push oral intake as tolerated. Reviewed high-calorie, high-protein food choices. Questions were answered.  Monitoring, evaluation, goals: Patient will tolerate increased calories and protein to minimize further weight loss.  Next visit: Patient to contact me after she finds out surgical date.  **Disclaimer: This note was dictated with voice recognition software. Similar sounding words can inadvertently be transcribed and this note may contain transcription errors which may not have been corrected upon publication of note.**

## 2018-07-23 ENCOUNTER — Ambulatory Visit: Payer: 59

## 2018-07-23 ENCOUNTER — Inpatient Hospital Stay: Payer: 59

## 2018-07-23 VITALS — BP 113/71 | HR 100 | Temp 98.2°F | Resp 16

## 2018-07-23 DIAGNOSIS — C162 Malignant neoplasm of body of stomach: Secondary | ICD-10-CM

## 2018-07-23 DIAGNOSIS — Z5111 Encounter for antineoplastic chemotherapy: Secondary | ICD-10-CM | POA: Diagnosis not present

## 2018-07-23 MED ORDER — PEGFILGRASTIM-CBQV 6 MG/0.6ML ~~LOC~~ SOSY
6.0000 mg | PREFILLED_SYRINGE | Freq: Once | SUBCUTANEOUS | Status: AC
  Administered 2018-07-23: 6 mg via SUBCUTANEOUS

## 2018-07-23 MED ORDER — HEPARIN SOD (PORK) LOCK FLUSH 100 UNIT/ML IV SOLN
500.0000 [IU] | Freq: Once | INTRAVENOUS | Status: AC | PRN
  Administered 2018-07-23: 500 [IU]
  Filled 2018-07-23: qty 5

## 2018-07-23 MED ORDER — SODIUM CHLORIDE 0.9% FLUSH
10.0000 mL | INTRAVENOUS | Status: DC | PRN
  Administered 2018-07-23: 10 mL
  Filled 2018-07-23: qty 10

## 2018-07-23 MED ORDER — PEGFILGRASTIM-CBQV 6 MG/0.6ML ~~LOC~~ SOSY
PREFILLED_SYRINGE | SUBCUTANEOUS | Status: AC
  Filled 2018-07-23: qty 0.6

## 2018-07-23 NOTE — Patient Instructions (Signed)
Pegfilgrastim injection  What is this medicine?  PEGFILGRASTIM (PEG fil gra stim) is a long-acting granulocyte colony-stimulating factor that stimulates the growth of neutrophils, a type of white blood cell important in the body's fight against infection. It is used to reduce the incidence of fever and infection in patients with certain types of cancer who are receiving chemotherapy that affects the bone marrow, and to increase survival after being exposed to high doses of radiation.  This medicine may be used for other purposes; ask your health care provider or pharmacist if you have questions.  COMMON BRAND NAME(S): Fulphila, Neulasta, UDENYCA  What should I tell my health care provider before I take this medicine?  They need to know if you have any of these conditions:  -kidney disease  -latex allergy  -ongoing radiation therapy  -sickle cell disease  -skin reactions to acrylic adhesives (On-Body Injector only)  -an unusual or allergic reaction to pegfilgrastim, filgrastim, other medicines, foods, dyes, or preservatives  -pregnant or trying to get pregnant  -breast-feeding  How should I use this medicine?  This medicine is for injection under the skin. If you get this medicine at home, you will be taught how to prepare and give the pre-filled syringe or how to use the On-body Injector. Refer to the patient Instructions for Use for detailed instructions. Use exactly as directed. Tell your healthcare provider immediately if you suspect that the On-body Injector may not have performed as intended or if you suspect the use of the On-body Injector resulted in a missed or partial dose.  It is important that you put your used needles and syringes in a special sharps container. Do not put them in a trash can. If you do not have a sharps container, call your pharmacist or healthcare provider to get one.  Talk to your pediatrician regarding the use of this medicine in children. While this drug may be prescribed for  selected conditions, precautions do apply.  Overdosage: If you think you have taken too much of this medicine contact a poison control center or emergency room at once.  NOTE: This medicine is only for you. Do not share this medicine with others.  What if I miss a dose?  It is important not to miss your dose. Call your doctor or health care professional if you miss your dose. If you miss a dose due to an On-body Injector failure or leakage, a new dose should be administered as soon as possible using a single prefilled syringe for manual use.  What may interact with this medicine?  Interactions have not been studied.  Give your health care provider a list of all the medicines, herbs, non-prescription drugs, or dietary supplements you use. Also tell them if you smoke, drink alcohol, or use illegal drugs. Some items may interact with your medicine.  This list may not describe all possible interactions. Give your health care provider a list of all the medicines, herbs, non-prescription drugs, or dietary supplements you use. Also tell them if you smoke, drink alcohol, or use illegal drugs. Some items may interact with your medicine.  What should I watch for while using this medicine?  You may need blood work done while you are taking this medicine.  If you are going to need a MRI, CT scan, or other procedure, tell your doctor that you are using this medicine (On-Body Injector only).  What side effects may I notice from receiving this medicine?  Side effects that you should report to   your doctor or health care professional as soon as possible:  -allergic reactions like skin rash, itching or hives, swelling of the face, lips, or tongue  -back pain  -dizziness  -fever  -pain, redness, or irritation at site where injected  -pinpoint red spots on the skin  -red or dark-brown urine  -shortness of breath or breathing problems  -stomach or side pain, or pain at the shoulder  -swelling  -tiredness  -trouble passing urine or  change in the amount of urine  Side effects that usually do not require medical attention (report to your doctor or health care professional if they continue or are bothersome):  -bone pain  -muscle pain  This list may not describe all possible side effects. Call your doctor for medical advice about side effects. You may report side effects to FDA at 1-800-FDA-1088.  Where should I keep my medicine?  Keep out of the reach of children.  If you are using this medicine at home, you will be instructed on how to store it. Throw away any unused medicine after the expiration date on the label.  NOTE: This sheet is a summary. It may not cover all possible information. If you have questions about this medicine, talk to your doctor, pharmacist, or health care provider.   2019 Elsevier/Gold Standard (2017-06-09 16:57:08)

## 2018-07-27 ENCOUNTER — Telehealth: Payer: Self-pay

## 2018-07-27 NOTE — Telephone Encounter (Signed)
Spoke with patient regarding lab results, per Dr. Burr Medico iron level is low and she is recommending IV iron X 2, she is in agreement, scheduling message has been sent.

## 2018-07-27 NOTE — Telephone Encounter (Signed)
-----   Message from [name], MD sent at 07/25/2018  8:41 PM EDT ----- Malachy Mood, please let pt know her know her iron level is low, I recommend iv iron next week, and second dose on next OV. Please schedule if she agrees, thanks  [employer]  07/25/2018

## 2018-07-28 ENCOUNTER — Telehealth: Payer: Self-pay | Admitting: Hematology

## 2018-07-28 NOTE — Telephone Encounter (Signed)
Scheduled appts per sch msg. Called patient, no answer. Left msg.

## 2018-07-29 ENCOUNTER — Telehealth: Payer: Self-pay | Admitting: Hematology

## 2018-07-29 NOTE — Telephone Encounter (Signed)
Called patient per 5/12 sch message - left message for patient to call back to reschedule appt .

## 2018-07-31 ENCOUNTER — Other Ambulatory Visit: Payer: Self-pay

## 2018-07-31 ENCOUNTER — Inpatient Hospital Stay: Payer: 59

## 2018-07-31 VITALS — BP 119/77 | HR 83 | Temp 98.3°F | Resp 16

## 2018-07-31 DIAGNOSIS — Z95828 Presence of other vascular implants and grafts: Secondary | ICD-10-CM

## 2018-07-31 DIAGNOSIS — Z5111 Encounter for antineoplastic chemotherapy: Secondary | ICD-10-CM | POA: Diagnosis not present

## 2018-07-31 DIAGNOSIS — D500 Iron deficiency anemia secondary to blood loss (chronic): Secondary | ICD-10-CM

## 2018-07-31 MED ORDER — SODIUM CHLORIDE 0.9 % IV SOLN
INTRAVENOUS | Status: DC
  Administered 2018-07-31: 09:00:00 via INTRAVENOUS
  Filled 2018-07-31: qty 250

## 2018-07-31 MED ORDER — SODIUM CHLORIDE 0.9 % IV SOLN
510.0000 mg | Freq: Once | INTRAVENOUS | Status: AC
  Administered 2018-07-31: 10:00:00 510 mg via INTRAVENOUS
  Filled 2018-07-31: qty 17

## 2018-07-31 MED ORDER — HEPARIN SOD (PORK) LOCK FLUSH 100 UNIT/ML IV SOLN
500.0000 [IU] | Freq: Once | INTRAVENOUS | Status: AC
  Administered 2018-07-31: 10:00:00 500 [IU]
  Filled 2018-07-31: qty 5

## 2018-07-31 MED ORDER — SODIUM CHLORIDE 0.9% FLUSH
10.0000 mL | Freq: Once | INTRAVENOUS | Status: AC
  Administered 2018-07-31: 10:00:00 10 mL
  Filled 2018-07-31: qty 10

## 2018-07-31 NOTE — Patient Instructions (Signed)

## 2018-08-05 ENCOUNTER — Telehealth: Payer: Self-pay | Admitting: Hematology

## 2018-08-05 NOTE — Telephone Encounter (Signed)
Per 5/15 schedule message moved 6/8 infusion to 6/9. Per schedule message patient has f/u with surgeon right after YF. Confirmed with patient.

## 2018-08-07 ENCOUNTER — Telehealth: Payer: Self-pay | Admitting: *Deleted

## 2018-08-07 NOTE — Telephone Encounter (Signed)
MR faxed to [facility] - Julian Hy - [id number]

## 2018-08-20 NOTE — Progress Notes (Signed)
[place]   Telephone:([phone] Fax:([phone]) [phone]   Clinic Follow up Note   Patient Care Team: Emeterio Reeve, DO as PCP - General (Osteopathic Medicine)  Date of Service:  08/24/2018  CHIEF COMPLAINT: f/u of Gastric Cancer  SUMMARY OF ONCOLOGIC HISTORY: Oncology History   Cancer Staging Gastric cancer [facility]) Staging form: Stomach, AJCC 8th Edition - Clinical stage from 03/29/2018: Stage IVA (cT4b, cN1, cM0) - Signed by [name], MD on 04/03/2018       Gastric cancer ([place])   03/19/2018 Procedure    Upper Endoscopy by Dr. Michail Sermon 03/29/18  IMPRESSION - Normal esophagus. - Z-line regular, 38 cm from the incisors. - Non-obstructing oozing gastric ulcer with pigmented material. Biopsied. - Normal examined duodenum. - Acute gastritis.    03/29/2018 Procedure    Colonoscopy by Dr. Michail Sermon 03/29/18 IMPRESSION - Preparation of the colon was fair. - Internal hemorrhoids. - The examined portion of the ileum was normal. - No specimens collected.    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 - SEE COMMENT. Microscopic Comment A Warthin Starry stain is negative for the presence of Helicobacter pylori organisms. Dr Vic Ripper has reviewed the case and concurs with this interpretation. Dr Michail Sermon was paged on 03/31/2018. (JBK:ecj 03/31/2018)    03/29/2018 Cancer Staging    Staging form: Stomach, AJCC 8th Edition - Clinical stage from 03/29/2018: Stage IVA (cT4b, cN1, cM0) - Signed by [name], MD on 04/03/2018    04/02/2018 Initial Diagnosis    Gastric cancer ([place])    04/02/2018 Imaging    CT CAP W Contrast 04/02/18 IMPRESSION: 1. Focal thickening lateral wall proximal stomach with protrusion of low-attenuation soft tissue beyond the expected confines of the gastric wall into the splenic hilum. Imaging features highly concerning for transmural tumor extension into the splenic  hilum. Several small nodules in this region are likely lymph nodes, concerning for metastatic disease. 2. Scattered bilateral pulmonary nodules measuring up to 5 mm. Nonspecific, but close attention on follow-up recommended as metastatic disease not excluded. 3. Small lymph nodes in the gastrohepatic ligament, but there is no gastrohepatic or hepato duodenal ligament lymphadenopathy. No evidence for liver metastases. 4.  Aortic Atherosclerois (ICD10-170.0    04/15/2018 - 07/22/2018 Chemotherapy    FLOT4 every 2 weeks starting 04/15/18. She completed 8 cycles on 07/22/18.    08/21/2018 Imaging    CT CAP W Contrast  IMPRESSION: 1. No evidence of residual gastric mass. No abdominopelvic adenopathy. 2. Similar bilateral pulmonary nodules, indeterminate. 3. Development of upper lung and peribronchovascular predominant pulmonary opacities. Differential considerations include drug toxicity or atypical infection. Lymphangitic tumor spread felt less likely but cannot be excluded. Consider chest CT follow-up at 3-6 months. 4.  Aortic Atherosclerosis (ICD10-I70.0).       CURRENT THERAPY:  PENDING surgery   INTERVAL HISTORY:  Daron Breeding is here for a follow up. She presents to the clinic alone. She feels she is recovering from chemo. She notes her toes are getting darker around her nails. She also notes redness of toes. She denies itching. She notes she is active She notes her hands are well. She denies cough , SOB or fever. I reviewed her medication list with her.     REVIEW OF SYSTEMS:   Constitutional: Denies fevers, chills or abnormal weight loss Eyes: Denies blurriness of vision Ears, nose, mouth, throat, and face: Denies mucositis or sore throat Respiratory: Denies cough, dyspnea or wheezes  Cardiovascular: Denies palpitation, chest discomfort or lower extremity swelling Gastrointestinal:  Denies nausea, heartburn or change in bowel habits Skin: Denies abnormal skin rashes (+)  toes are getting darker around her nails and redness of toes Lymphatics: Denies new lymphadenopathy or easy bruising Neurological:Denies numbness, tingling or new weaknesses Behavioral/Psych: Mood is stable, no new changes  All other systems were reviewed with the patient and are negative.  MEDICAL HISTORY:  Past Medical History:  Diagnosis Date   Cancer [facility])    Hypertension    Thyroid disease     SURGICAL HISTORY: Past Surgical History:  Procedure Laterality Date   ABDOMINAL HYSTERECTOMY  1996   BILATERAL SALPINGOOPHORECTOMY  2007   BIOPSY  03/29/2018   Procedure: BIOPSY;  Surgeon: Wilford Corner, MD;  Location: [facility];  Service: Endoscopy;;   CARPAL TUNNEL RELEASE     COLONOSCOPY N/A 03/29/2018   Procedure: COLONOSCOPY;  Surgeon: Wilford Corner, MD;  Location: [place];  Service: Endoscopy;  Laterality: N/A;   ESOPHAGOGASTRODUODENOSCOPY N/A 03/29/2018   Procedure: ESOPHAGOGASTRODUODENOSCOPY (EGD);  Surgeon: Wilford Corner, MD;  Location: [facility];  Service: Endoscopy;  Laterality: N/A;   LAPAROSCOPY N/A 04/13/2018   Procedure: LAPAROSCOPY DIAGNOSTIC;  Surgeon: [name], MD;  Location: [facility];  Service: General;  Laterality: N/A;   MASTECTOMY  2007   PORTACATH PLACEMENT Left 04/13/2018   Procedure: INSERTION PORT-A-CATH ERAS PATHWAY;  Surgeon: [name], MD;  Location: [facility];  Service: General;  Laterality: Left;  subclavian    I have reviewed the social history and family history with the patient and they are unchanged from previous note.  ALLERGIES:  has No Known Allergies.  MEDICATIONS:  Current Outpatient Medications  Medication Sig Dispense Refill   ferrous sulfate 325 (65 FE) MG tablet Take 1 tablet (325 mg total) by mouth 2 (two) times daily with a meal. 90 tablet 0   levothyroxine (SYNTHROID, LEVOTHROID) 150 MCG tablet Take 1 tablet (150 mcg total) by mouth daily before breakfast. 90 tablet 3   acyclovir (ZOVIRAX) 400 MG  tablet Take 1 tablet (400 mg total) by mouth 3 (three) times daily. For five days. As needed for cold sore. 15 tablet 0   albuterol (PROVENTIL HFA;VENTOLIN HFA) 108 (90 Base) MCG/ACT inhaler Inhale 1-2 puffs into the lungs every 4 (four) hours as needed for wheezing or shortness of breath. 2 Inhaler 3   budesonide-formoterol (SYMBICORT) 160-4.5 MCG/ACT inhaler Inhale 2 puffs into the lungs 2 (two) times daily. (Patient taking differently: Inhale 2 puffs into the lungs 2 (two) times daily as needed (respiratory issues.). ) 2 Inhaler 3   KLOR-CON M20 20 MEQ tablet TAKE 1 TABLET BY MOUTH 2 TIMES DAILY X 3 DAYS, THEN ONE DAILY THEREAFTER. 36 tablet 1   loperamide (IMODIUM A-D) 2 MG tablet Take 2 mg by mouth 4 (four) times daily as needed for diarrhea or loose stools.     ondansetron (ZOFRAN) 8 MG tablet Take 1 tablet (8 mg total) by mouth every 8 (eight) hours as needed for nausea or vomiting. (Patient not taking: Reported on 08/24/2018) 30 tablet 1   pantoprazole (PROTONIX) 40 MG tablet Take 1 tablet (40 mg total) by mouth daily for 30 days. 30 tablet 3   prochlorperazine (COMPAZINE) 10 MG tablet Take 1 tablet (10 mg total) by mouth every 6 (six) hours as needed for nausea or vomiting. (Patient not taking: Reported on 08/24/2018) 30 tablet 2   vitamin B-12 1000 MCG tablet Take 1 tablet (1,000 mcg total) by mouth  daily. 30 tablet 0   No current facility-administered medications for this visit.     PHYSICAL EXAMINATION: ECOG PERFORMANCE STATUS: 1 - Symptomatic but completely ambulatory  Vitals:   08/24/18 1335  BP: 135/81  Pulse: 70  Resp: 18  Temp: 98.7 F (37.1 C)  SpO2: 100%   Filed Weights   08/24/18 1335  Weight: 141 lb (64 kg)    GENERAL:alert, no distress and comfortable SKIN: skin color, texture, turgor are normal, no rashes or significant lesions (+) Skin darkening around toes nails and skin erythema of toes, skin cold to touch  EYES: normal, Conjunctiva are pink and  non-injected, sclera clear  NECK: supple, thyroid normal size, non-tender, without nodularity LYMPH:  no palpable lymphadenopathy in the cervical, axillary  LUNGS: clear to auscultation and percussion with normal breathing effort HEART: regular rate & rhythm and no murmurs and no lower extremity edema ABDOMEN:abdomen soft, non-tender and normal bowel sounds Musculoskeletal:no cyanosis of digits and no clubbing  NEURO: alert & oriented x 3 with fluent speech, no focal motor/sensory deficits  LABORATORY DATA:  I have reviewed the data as listed CBC Latest Ref Rng & Units 08/24/2018 07/22/2018 07/08/2018  WBC 4.0 - 10.5 K/uL 5.7 10.6(H) 13.7(H)  Hemoglobin 12.0 - 15.0 g/dL 11.0(L) 10.7(L) 11.3(L)  Hematocrit 36.0 - 46.0 % 32.3(L) 32.6(L) 33.7(L)  Platelets 150 - 400 K/uL 169 124(L) 179     CMP Latest Ref Rng & Units 08/24/2018 07/22/2018 07/08/2018  Glucose 70 - 99 mg/dL 87 105(H) 118(H)  BUN 6 - 20 mg/dL 12 9 12   Creatinine 0.44 - 1.00 mg/dL 0.61 0.70 0.71  Sodium 135 - 145 mmol/L 138 141 137  Potassium 3.5 - 5.1 mmol/L 3.4(L) 3.7 4.0  Chloride 98 - 111 mmol/L 104 105 102  CO2 22 - 32 mmol/L 25 26 25   Calcium 8.9 - 10.3 mg/dL 9.2 8.9 9.3  Total Protein 6.5 - 8.1 g/dL 7.7 7.4 8.1  Total Bilirubin 0.3 - 1.2 mg/dL 0.3 0.2(L) 0.3  Alkaline Phos 38 - 126 U/L 72 99 105  AST 15 - 41 U/L 24 28 27   ALT 0 - 44 U/L 13 18 21       RADIOGRAPHIC STUDIES: I have personally reviewed the radiological images as listed and agreed with the findings in the report. No results found.   ASSESSMENT & PLAN:  Mariah Kemp is a 58 y.o. female with   1. 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 -She has seen surgeon Dr. Barry Dienes, and underwent exploratory laparoscope which was negative for peritoneal metastasis. -Given her locally advanced disease, she recently completed 8 cycles of neo-adjuvant chemoFLOT4.  -We personally reviewed the  images and discussed her restaging CT CAP from 08/21/18 which shows no evidence of residual gastric mass. Similar b/l pulmonary nodules are indeterminate. There is multiple small infiltrates of her lungs which would indicate bronchitis.  -I recommend she do COVID-19 testing. She agrees to proceed.  -Overall she had good response from neoadjuvant chemo. She is clinically doing well. Labs reviewed, CBC and CMP WNL except Hg 11, K 3.4, albumin 3.4. Ferritin and Ca 19.9 still pending. Physical exam unremarkable.  -I discussed she will proceed with surgery with Dr. Barry Dienes. She will see her this afternoon.  -I discussed her surgical pathology indicating significant residual disease or positive margins, then she may need adjuvant radiation and or chemo. If she has had good response to neoadjuvant chemo with limited residual disease, then  no adjuvant chem is needed.  -F/u after surgery.   2. Anemia, iron deficient from chronic GI bleeding. -Secondary to GI blood loss  -Given 3u blood transfusion on 04/01/18, responded moderately well.  -She is s/p 2 doses of IV iron in 03/2018, she responded moderately wellwith improved anemia. -Hemoglobin11today(08/24/18), Ferritin still pending.Will continue monitoring. Will proceed with IV Feraheme tomorrow.   3. H/o stage 3 right breast cancer in 2007 -Treated with right mastectomy, reconstruction with 3 LN removed. She underwent chemotherapy for 5 months, likely AC-T. She completed Tamoxifen for at least 5 years.   4. Genetic  -I discussed her eligibility for genetic testing. She has personal history of breast and gastric cancer. She waspreviouslyreferred. She has not been seen yet, due to her concerns of insurance coverage  -due to the high deductible for her cancer care, she is not able to afford the genetic test out of pocket   5. Hypokalemia -She will continue potassium chloride 20 meq daily -K 3.4 today (08/24/18). Diarrhea has resolved. Take oral  potassium BID for the next 2 days and return to once daily for 3 week.  -She can also increase potassium in diet.   6. HTN andhypothyroidism -We will hold hydrochlorothiazide for now during the chemo -HTN very well controlled -Continue to follow-up with PCP  7. Diarrhea, Low appetite, weight loss, Mild to moderate Malnutrition -She had poor appetite with 4th cycle due to taste change resulting in continued weight loss -diarrhea now resolved, she is recovering from chemo well. Weight has been maintained.   8. Atypical infiltrates in b/l lungs -she is asymptomatic  -will test for COVID-19 tomorrow   PLAN: COVID-19 testing tomorrow She will return tomorrow for iv feraheme  F/u after surgery, appointment open    No problem-specific Assessment & Plan notes found for this encounter.   Orders Placed This Encounter  Procedures   SARS-COV-2 RNA,(COVID-19) QUAL NAAT    Standing Status:   Future    Standing Expiration Date:   08/24/2019   All questions were answered. The patient knows to call the clinic with any problems, questions or concerns. No barriers to learning was detected. I spent 20 minutes counseling the patient face to face. The total time spent in the appointment was 25 minutes and more than 50% was on counseling and review of test results     [name], MD 08/24/2018   I, Joslyn Devon, am acting as scribe for [name], MD.   I have reviewed the above documentation for accuracy and completeness, and I agree with the above.    Addendum  I spoke with pt, she informed me that her surgery will be in about 2 weeks and she will be tested for covid before surgery. So she wants to wait her COVID testing until before her surgery. She is asymptomatic, okay to watch it for now.  She knows to call my office or go to emergency room if she developed any respiratory symptoms or fever.  [name]

## 2018-08-21 ENCOUNTER — Ambulatory Visit (HOSPITAL_COMMUNITY)
Admission: RE | Admit: 2018-08-21 | Discharge: 2018-08-21 | Disposition: A | Discharge status: Home / Self Care | Payer: 59 | Source: Ambulatory Visit | Attending: Nurse Practitioner | Admitting: Nurse Practitioner

## 2018-08-21 ENCOUNTER — Other Ambulatory Visit: Payer: Self-pay

## 2018-08-21 DIAGNOSIS — C162 Malignant neoplasm of body of stomach: Secondary | ICD-10-CM

## 2018-08-21 MED ORDER — IOHEXOL 300 MG/ML  SOLN
100.0000 mL | Freq: Once | INTRAMUSCULAR | Status: AC | PRN
  Administered 2018-08-21: 100 mL via INTRAVENOUS

## 2018-08-21 MED ORDER — SODIUM CHLORIDE (PF) 0.9 % IJ SOLN
INTRAMUSCULAR | Status: AC
  Filled 2018-08-21: qty 50

## 2018-08-24 ENCOUNTER — Other Ambulatory Visit: Payer: Self-pay

## 2018-08-24 ENCOUNTER — Telehealth: Payer: Self-pay | Admitting: *Deleted

## 2018-08-24 ENCOUNTER — Inpatient Hospital Stay: Payer: 59

## 2018-08-24 ENCOUNTER — Inpatient Hospital Stay (HOSPITAL_BASED_OUTPATIENT_CLINIC_OR_DEPARTMENT_OTHER): Payer: 59 | Admitting: Hematology

## 2018-08-24 ENCOUNTER — Other Ambulatory Visit: Payer: Self-pay | Admitting: *Deleted

## 2018-08-24 ENCOUNTER — Other Ambulatory Visit: Payer: Self-pay | Admitting: General Surgery

## 2018-08-24 ENCOUNTER — Inpatient Hospital Stay: Payer: 59 | Attending: Hematology

## 2018-08-24 ENCOUNTER — Encounter: Payer: Self-pay | Admitting: Hematology

## 2018-08-24 ENCOUNTER — Ambulatory Visit: Payer: 59

## 2018-08-24 VITALS — BP 135/81 | HR 70 | Temp 98.7°F | Resp 18 | Ht 61.0 in | Wt 141.0 lb

## 2018-08-24 DIAGNOSIS — C162 Malignant neoplasm of body of stomach: Secondary | ICD-10-CM | POA: Insufficient documentation

## 2018-08-24 DIAGNOSIS — E876 Hypokalemia: Secondary | ICD-10-CM | POA: Diagnosis not present

## 2018-08-24 DIAGNOSIS — Z95828 Presence of other vascular implants and grafts: Secondary | ICD-10-CM

## 2018-08-24 DIAGNOSIS — K922 Gastrointestinal hemorrhage, unspecified: Secondary | ICD-10-CM

## 2018-08-24 DIAGNOSIS — E039 Hypothyroidism, unspecified: Secondary | ICD-10-CM

## 2018-08-24 DIAGNOSIS — D500 Iron deficiency anemia secondary to blood loss (chronic): Secondary | ICD-10-CM

## 2018-08-24 DIAGNOSIS — Z853 Personal history of malignant neoplasm of breast: Secondary | ICD-10-CM | POA: Diagnosis not present

## 2018-08-24 DIAGNOSIS — I10 Essential (primary) hypertension: Secondary | ICD-10-CM

## 2018-08-24 DIAGNOSIS — C169 Malignant neoplasm of stomach, unspecified: Secondary | ICD-10-CM

## 2018-08-24 DIAGNOSIS — R918 Other nonspecific abnormal finding of lung field: Secondary | ICD-10-CM | POA: Diagnosis not present

## 2018-08-24 DIAGNOSIS — J189 Pneumonia, unspecified organism: Secondary | ICD-10-CM

## 2018-08-24 LAB — CBC WITH DIFFERENTIAL (CANCER CENTER ONLY)
Abs Immature Granulocytes: 0.01 10*3/uL (ref 0.00–0.07)
Basophils Absolute: 0.1 10*3/uL (ref 0.0–0.1)
Basophils Relative: 1 %
Eosinophils Absolute: 0.5 10*3/uL (ref 0.0–0.5)
Eosinophils Relative: 9 %
HCT: 32.3 % — ABNORMAL LOW (ref 36.0–46.0)
Hemoglobin: 11 g/dL — ABNORMAL LOW (ref 12.0–15.0)
Immature Granulocytes: 0 %
Lymphocytes Relative: 27 %
Lymphs Abs: 1.5 10*3/uL (ref 0.7–4.0)
MCH: 34.3 pg — ABNORMAL HIGH (ref 26.0–34.0)
MCHC: 34.1 g/dL (ref 30.0–36.0)
MCV: 100.6 fL — ABNORMAL HIGH (ref 80.0–100.0)
Monocytes Absolute: 0.6 10*3/uL (ref 0.1–1.0)
Monocytes Relative: 10 %
Neutro Abs: 3.1 10*3/uL (ref 1.7–7.7)
Neutrophils Relative %: 53 %
Platelet Count: 169 10*3/uL (ref 150–400)
RBC: 3.21 MIL/uL — ABNORMAL LOW (ref 3.87–5.11)
RDW: 14.3 % (ref 11.5–15.5)
WBC Count: 5.7 10*3/uL (ref 4.0–10.5)
nRBC: 0 % (ref 0.0–0.2)

## 2018-08-24 LAB — CMP (CANCER CENTER ONLY)
ALT: 13 U/L (ref 0–44)
AST: 24 U/L (ref 15–41)
Albumin: 3.4 g/dL — ABNORMAL LOW (ref 3.5–5.0)
Alkaline Phosphatase: 72 U/L (ref 38–126)
Anion gap: 9 (ref 5–15)
BUN: 12 mg/dL (ref 6–20)
CO2: 25 mmol/L (ref 22–32)
Calcium: 9.2 mg/dL (ref 8.9–10.3)
Chloride: 104 mmol/L (ref 98–111)
Creatinine: 0.61 mg/dL (ref 0.44–1.00)
GFR, Est AFR Am: >60 mL/min (ref >60)
GFR, Estimated: >60 mL/min (ref >60)
Glucose, Bld: 87 mg/dL (ref 70–99)
Potassium: 3.4 mmol/L — ABNORMAL LOW (ref 3.5–5.1)
Sodium: 138 mmol/L (ref 135–145)
Total Bilirubin: 0.3 mg/dL (ref 0.3–1.2)
Total Protein: 7.7 g/dL (ref 6.5–8.1)

## 2018-08-24 LAB — SAMPLE TO BLOOD BANK

## 2018-08-24 LAB — FERRITIN: Ferritin: 1254 ng/mL — ABNORMAL HIGH (ref 11–307)

## 2018-08-24 MED ORDER — HEPARIN SOD (PORK) LOCK FLUSH 100 UNIT/ML IV SOLN
500.0000 [IU] | Freq: Once | INTRAVENOUS | Status: AC
  Administered 2018-08-24: 500 [IU]
  Filled 2018-08-24: qty 5

## 2018-08-24 MED ORDER — SODIUM CHLORIDE 0.9% FLUSH
10.0000 mL | Freq: Once | INTRAVENOUS | Status: AC
  Administered 2018-08-24: 10 mL
  Filled 2018-08-24: qty 10

## 2018-08-24 MED ORDER — HEPARIN SOD (PORK) LOCK FLUSH 100 UNIT/ML IV SOLN
500.0000 [IU] | Freq: Once | INTRAVENOUS | Status: AC
  Administered 2018-08-24: 500 [IU] via INTRAVENOUS
  Filled 2018-08-24: qty 5

## 2018-08-24 NOTE — Telephone Encounter (Signed)
MR faxed to [employer] - Release:  [id number]

## 2018-08-24 NOTE — Telephone Encounter (Signed)
Left message for pt to CB for Covid testing/scheduling.  Requested by [place]  Pt with cough "Abnormal breath sounds."  [facility] ph # [phone]  Fax # [phone]  Pt CB # [phone]

## 2018-08-24 NOTE — Patient Instructions (Signed)

## 2018-08-24 NOTE — Progress Notes (Signed)
PT was her for a flush today and has treatment tomorrow, she didn't want to stay accessed.

## 2018-08-25 ENCOUNTER — Telehealth: Payer: Self-pay | Admitting: Hematology

## 2018-08-25 ENCOUNTER — Other Ambulatory Visit: Payer: Self-pay

## 2018-08-25 ENCOUNTER — Inpatient Hospital Stay: Payer: 59

## 2018-08-25 VITALS — BP 122/73 | HR 67 | Temp 98.9°F | Resp 18

## 2018-08-25 DIAGNOSIS — D500 Iron deficiency anemia secondary to blood loss (chronic): Secondary | ICD-10-CM

## 2018-08-25 DIAGNOSIS — Z95828 Presence of other vascular implants and grafts: Secondary | ICD-10-CM

## 2018-08-25 DIAGNOSIS — C162 Malignant neoplasm of body of stomach: Secondary | ICD-10-CM | POA: Diagnosis not present

## 2018-08-25 LAB — CANCER ANTIGEN 19-9: CA 19-9: 15 U/mL (ref 0–35)

## 2018-08-25 MED ORDER — HEPARIN SOD (PORK) LOCK FLUSH 100 UNIT/ML IV SOLN
500.0000 [IU] | Freq: Once | INTRAVENOUS | Status: AC
  Administered 2018-08-25: 500 [IU]
  Filled 2018-08-25: qty 5

## 2018-08-25 MED ORDER — SODIUM CHLORIDE 0.9% FLUSH
10.0000 mL | Freq: Once | INTRAVENOUS | Status: AC
  Administered 2018-08-25: 10 mL
  Filled 2018-08-25: qty 10

## 2018-08-25 MED ORDER — SODIUM CHLORIDE 0.9 % IV SOLN
Freq: Once | INTRAVENOUS | Status: AC
  Administered 2018-08-25: 08:00:00 via INTRAVENOUS
  Filled 2018-08-25: qty 250

## 2018-08-25 MED ORDER — SODIUM CHLORIDE 0.9 % IV SOLN
510.0000 mg | Freq: Once | INTRAVENOUS | Status: AC
  Administered 2018-08-25: 510 mg via INTRAVENOUS
  Filled 2018-08-25: qty 17

## 2018-08-25 NOTE — Telephone Encounter (Signed)
F/u open per 6/8 los.

## 2018-08-25 NOTE — Patient Instructions (Signed)

## 2018-09-28 NOTE — Pre-Procedure Instructions (Signed)
[id number] [address], [name] [address] Phone: [phone] Fax: [phone]      Your procedure is scheduled on  10-02-18 Friday  Report to [facility] Main Entrance "A" at 0930A.M., and check in at the Admitting office.  Call this number if you have problems the morning of surgery:  [phone]  Call [phone] if you have any questions prior to your surgery date Monday-Friday 8am-4pm    Remember:  Do not eat  after midnight the night before your surgery  You may drink clear liquids until 0830am the morning of your surgery.   Clear liquids allowed are: Water, Non-Citrus Juices (without pulp), Carbonated Beverages, Clear Tea, Black Coffee Only, and Gatorade   Take these medicines the morning of surgery with A SIP OF WATER : levothyroxine (SYNTHROID, LEVOTHROID) acyclovir (ZOVIRAX) as needed albuterol as needed. Bring your inhaler with you .  7 days prior to surgery STOP taking any Aspirin (unless otherwise instructed by your surgeon), Aleve, Naproxen, Ibuprofen, Motrin, Advil, Goody's, BC's, all herbal medications, fish oil, and all vitamins.    The Morning of Surgery  Do not wear jewelry, make-up or nail polish.  Do not wear lotions, powders, or perfumes or deodorant  Do not shave 48 hours prior to surgery.    Do not bring valuables to the hospital.  [facility] is not responsible for any belongings or valuables.  If you are a smoker, DO NOT Smoke 24 hours prior to surgery IF you wear a CPAP at night please bring your mask, tubing, and machine the morning of surgery   Remember that you must have someone to transport you home after your surgery, and remain with you for 24 hours if you are discharged the same day.   Contacts, glasses, hearing aids, dentures or bridgework may not be worn into surgery.    Leave your suitcase in the car.  After surgery it may be brought to your room.  For patients  admitted to the hospital, discharge time will be determined by your treatment team.  Patients discharged the day of surgery will not be allowed to drive home.    Special instructions:   - Preparing For Surgery  Before surgery, you can play an important role. Because skin is not sterile, your skin needs to be as free of germs as possible. You can reduce the number of germs on your skin by washing with CHG (chlorahexidine gluconate) Soap before surgery.  CHG is an antiseptic cleaner which kills germs and bonds with the skin to continue killing germs even after washing.    Oral Hygiene is also important to reduce your risk of infection.  Remember - BRUSH YOUR TEETH THE MORNING OF SURGERY WITH YOUR REGULAR TOOTHPASTE  Please do not use if you have an allergy to CHG or antibacterial soaps. If your skin becomes reddened/irritated stop using the CHG.  Do not shave (including legs and underarms) for at least 48 hours prior to first CHG shower. It is OK to shave your face.  Please follow these instructions carefully.   1. Shower the NIGHT BEFORE SURGERY and the MORNING OF SURGERY with CHG Soap.   2. If you chose to wash your hair, wash your hair first as usual with your normal shampoo.  3. After you shampoo, rinse your hair and body thoroughly to remove the shampoo.  4. Use CHG as you would any other liquid soap. You can apply CHG  directly to the skin and wash gently with a scrungie or a clean washcloth.   5. Apply the CHG Soap to your body ONLY FROM THE NECK DOWN.  Do not use on open wounds or open sores. Avoid contact with your eyes, ears, mouth and genitals (private parts). Wash Face and genitals (private parts)  with your normal soap.   6. Wash thoroughly, paying special attention to the area where your surgery will be performed.  7. Thoroughly rinse your body with warm water from the neck down.  8. DO NOT shower/wash with your normal soap after using and rinsing off the CHG  Soap.  9. Pat yourself dry with a CLEAN TOWEL.  10. Wear CLEAN PAJAMAS to bed the night before surgery, wear comfortable clothes the morning of surgery  11. Place CLEAN SHEETS on your bed the night of your first shower and DO NOT SLEEP WITH PETS.    Day of Surgery:  Do not apply any deodorants/lotions. Please shower the morning of surgery with the CHG soap  Please wear clean clothes to the hospital/surgery center.   Remember to brush your teeth WITH YOUR REGULAR TOOTHPASTE.   Please read over the following fact sheets that you were given.

## 2018-09-28 NOTE — H&P (Signed)
Mariah Kemp Location: [facility] Patient #: [id number] DOB: 04-Mar-1961 Undefined / Language: [name] / Race: White Female   History of Present Illness  The patient is a 58 year old female who presents for a follow-up for Gastric cancer. Pt is a 58 yo F referred for consultation by Dr. Burr Medico for a diagnosis of gastric cancer 03/2018. She presented wtih pain, extreme fatigue, and tachycardia. She was found to be anemic and was admitted to the hospital where she got blood transfusions, EGD, and colonoscopy. Her hgb was 4.4 upon admit. She was mound to hve a large fungating mass with ulcer. Staging scans were performed which showed potential invasion into the spleen and splenic nodes. She is getting set up for neoadjuvant chemotherapy. She is still quite fatigued. She does not have really abdominal pain, but she does have early satiety. She denies any significant weight loss. She has a history of breast cancer which was addressed in [place]. She did require chemo for that. Her father had liver cancer.   She underwent neoadjuvant chemotherapy. She tolerated it relatively well. Her weight is down around 25 pounds since she started chemotherapy. Her appetite is picking up.   CT abd/pelvis 08/21/2018 IMPRESSION: 1. No evidence of residual gastric mass. No abdominopelvic adenopathy. 2. Similar bilateral pulmonary nodules, indeterminate. 3. Development of upper lung and peribronchovascular predominant pulmonary opacities. Differential considerations include drug toxicity or atypical infection. Lymphangitic tumor spread felt less likely but cannot be excluded. Consider chest CT follow-up at 3-6 months. 4. Aortic Atherosclerosis (ICD10-I70.0).       Past Surgical History Breast Augmentation  Left. Breast Biopsy  Right. Breast Reconstruction  Right. Hysterectomy (not due to cancer) - Partial  Mastectomy  Right.  Diagnostic Studies History  Colonoscopy   within last year Mammogram  1-3 years ago Pap Smear  >5 years ago  Allergies No Known Allergies  [08/24/2018]: No Known Drug Allergies  [04/07/2018]: Allergies Reconciled   Medication History Levothyroxine Sodium (150MCG Tablet, Oral) Active. Protonix (Oral) Specific strength unknown - Active. Vitamin B12 (Oral) Specific strength unknown - Active. Ferrous Sulfate (Oral) Specific strength unknown - Active. Pantoprazole Sodium (40MG  Tablet DR, Oral) Active. Ondansetron HCl (8MG  Tablet, Oral) Active. Medications Reconciled  Social History Alcohol use  Occasional alcohol use. Caffeine use  Carbonated beverages, Coffee, Tea. No drug use  Tobacco use  Former smoker.  Family History Cancer  Father.  Pregnancy / Birth History  Age at menarche  27 years. Age of menopause  57-50 Contraceptive History  Intrauterine device, Oral contraceptives. Gravida  3 Length (months) of breastfeeding  3-6 Maternal age  54-20 Para  2  Other Problems  Asthma  Breast Cancer  Gastric Ulcer  Gastroesophageal Reflux Disease  Heart murmur  High blood pressure  Lump In Breast  Oophorectomy  Bilateral. Thyroid Disease     Review of Systems  General Present- Chills and Fatigue. Not Present- Appetite Loss, Fever, Night Sweats, Weight Gain and Weight Loss. Skin Not Present- Change in Wart/Mole, Dryness, Hives, Jaundice, New Lesions, Non-Healing Wounds, Rash and Ulcer. HEENT Not Present- Earache, Hearing Loss, Hoarseness, Nose Bleed, Oral Ulcers, Ringing in the Ears, Seasonal Allergies, Sinus Pain, Sore Throat, Visual Disturbances, Wears glasses/contact lenses and Yellow Eyes. Respiratory Not Present- Bloody sputum, Chronic Cough, Difficulty Breathing, Snoring and Wheezing. Breast Not Present- Breast Mass, Breast Pain, Nipple Discharge and Skin Changes. Cardiovascular Present- Rapid Heart Rate and Shortness of Breath. Not Present- Chest Pain, Difficulty Breathing  Lying Down, Leg Cramps, Palpitations  and Swelling of Extremities. Gastrointestinal Present- Bloating, Excessive gas and Gets full quickly at meals. Not Present- Abdominal Pain, Bloody Stool, Change in Bowel Habits, Chronic diarrhea, Constipation, Difficulty Swallowing, Hemorrhoids, Indigestion, Nausea, Rectal Pain and Vomiting. Female Genitourinary Not Present- Frequency, Nocturia, Painful Urination, Pelvic Pain and Urgency. Musculoskeletal Present- Muscle Weakness. Not Present- Back Pain, Joint Pain, Joint Stiffness, Muscle Pain and Swelling of Extremities. Neurological Present- Headaches and Weakness. Not Present- Decreased Memory, Fainting, Numbness, Seizures, Tingling, Tremor and Trouble walking. Endocrine Present- Cold Intolerance. Not Present- Excessive Hunger, Hair Changes, Heat Intolerance, Hot flashes and New Diabetes. Hematology Not Present- Blood Thinners, Easy Bruising, Excessive bleeding, Gland problems, HIV and Persistent Infections.  Vitals Weight: 140.13 lb Height: 60in Body Surface Area: 1.6 m Body Mass Index: 27.37 kg/m  Temp.: 96.88F (Temporal)  Pulse: 97 (Regular)  BP: 124/80(Sitting, Left Arm, Standard)       Physical Exam General Mental Status-Alert. General Appearance-Consistent with stated age. Hydration-Well hydrated. Voice-Normal.  Head and Neck Head-normocephalic, atraumatic with no lesions or palpable masses.  Eye Sclera/Conjunctiva - Bilateral-No scleral icterus.  Chest and Lung Exam Chest and lung exam reveals -quiet, even and easy respiratory effort with no use of accessory muscles. Inspection Chest Wall - Normal. Back - normal.  Breast - Did not examine.  Cardiovascular Cardiovascular examination reveals -normal pedal pulses bilaterally. Note: regular rate and rhythm  Abdomen Inspection-Inspection Normal. Palpation/Percussion Palpation and Percussion of the abdomen reveal - Soft, Non Tender, No Rebound  tenderness, No Rigidity (guarding) and No hepatosplenomegaly.  Peripheral Vascular Upper Extremity Inspection - Bilateral - Normal - No Clubbing, No Cyanosis, No Edema, Pulses Intact. Lower Extremity Palpation - Edema - Bilateral - No edema.  Neurologic Neurologic evaluation reveals -alert and oriented x 3 with no impairment of recent or remote memory. Mental Status-Normal.  Musculoskeletal Global Assessment -Note: no gross deformities.  Normal Exam - Left-Upper Extremity Strength Normal and Lower Extremity Strength Normal. Normal Exam - Right-Upper Extremity Strength Normal and Lower Extremity Strength Normal.  Lymphatic Head & Neck  General Head & Neck Lymphatics: Bilateral - Description - Normal. Axillary  General Axillary Region: Bilateral - Description - Normal. Tenderness - Non Tender.    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 Tumor has shrunk significantly. Spleen appears normal now.  Will plan subtotal vs total gastrectomy. Will send intraoperative margins. Given 25 pound weight loss AFTER dx, will definitely place feeding tube.  Discussed surgery wtih diagrams of anatomy. Reviewed risks of surgery including bleeding, infection, damage to adjacent structures, prolonged issues eating, pain, heart or lung issues, blood clot, and more.  Will plan at first available opportunity.  Current Plans You are being scheduled for surgery- Our schedulers will call you.  You should hear from our office's scheduling department within 5 working days about the location, date, and time of surgery. We try to make accommodations for patient's preferences in scheduling surgery, but sometimes the OR schedule or the surgeon's schedule prevents [place] from making those accommodations.  If you have not heard from our office [phone]) in 5 working days, call the office and ask for your surgeon's nurse.  If you  have other questions about your diagnosis, plan, or surgery, call the office and ask for your surgeon's nurse.    Signed by [name], MD

## 2018-09-29 ENCOUNTER — Encounter (HOSPITAL_COMMUNITY)
Admission: RE | Admit: 2018-09-29 | Discharge: 2018-09-29 | Disposition: A | Discharge status: Home / Self Care | Payer: 59 | Source: Ambulatory Visit | Attending: General Surgery | Admitting: General Surgery

## 2018-09-29 ENCOUNTER — Other Ambulatory Visit: Payer: Self-pay

## 2018-09-29 ENCOUNTER — Other Ambulatory Visit (HOSPITAL_COMMUNITY)
Admission: RE | Admit: 2018-09-29 | Discharge: 2018-09-29 | Disposition: A | Discharge status: Home / Self Care | Payer: 59 | Source: Ambulatory Visit | Attending: General Surgery | Admitting: General Surgery

## 2018-09-29 ENCOUNTER — Encounter (HOSPITAL_COMMUNITY): Payer: Self-pay

## 2018-09-29 DIAGNOSIS — Z1159 Encounter for screening for other viral diseases: Secondary | ICD-10-CM | POA: Insufficient documentation

## 2018-09-29 DIAGNOSIS — Z01812 Encounter for preprocedural laboratory examination: Secondary | ICD-10-CM | POA: Insufficient documentation

## 2018-09-29 HISTORY — DX: Hypothyroidism, unspecified: E03.9

## 2018-09-29 HISTORY — DX: Dyspnea, unspecified: R06.00

## 2018-09-29 HISTORY — DX: Unspecified asthma, uncomplicated: J45.909

## 2018-09-29 LAB — URINALYSIS, ROUTINE W REFLEX MICROSCOPIC
Bacteria, UA: NONE SEEN
Bilirubin Urine: NEGATIVE
Glucose, UA: NEGATIVE mg/dL
Ketones, ur: NEGATIVE mg/dL
Leukocytes,Ua: NEGATIVE
Nitrite: NEGATIVE
Protein, ur: 30 mg/dL — AB
Specific Gravity, Urine: 1.017 (ref 1.005–1.030)
pH: 5 (ref 5.0–8.0)

## 2018-09-29 LAB — CBC WITH DIFFERENTIAL/PLATELET
Abs Immature Granulocytes: 0.01 10*3/uL (ref 0.00–0.07)
Basophils Absolute: 0 10*3/uL (ref 0.0–0.1)
Basophils Relative: 1 %
Eosinophils Absolute: 0.4 10*3/uL (ref 0.0–0.5)
Eosinophils Relative: 9 %
HCT: 35 % — ABNORMAL LOW (ref 36.0–46.0)
Hemoglobin: 12.3 g/dL (ref 12.0–15.0)
Immature Granulocytes: 0 %
Lymphocytes Relative: 32 %
Lymphs Abs: 1.4 10*3/uL (ref 0.7–4.0)
MCH: 34.2 pg — ABNORMAL HIGH (ref 26.0–34.0)
MCHC: 35.1 g/dL (ref 30.0–36.0)
MCV: 97.2 fL (ref 80.0–100.0)
Monocytes Absolute: 0.4 10*3/uL (ref 0.1–1.0)
Monocytes Relative: 9 %
Neutro Abs: 2.1 10*3/uL (ref 1.7–7.7)
Neutrophils Relative %: 49 %
Platelets: 155 10*3/uL (ref 150–400)
RBC: 3.6 MIL/uL — ABNORMAL LOW (ref 3.87–5.11)
RDW: 11.8 % (ref 11.5–15.5)
WBC: 4.4 10*3/uL (ref 4.0–10.5)
nRBC: 0 % (ref 0.0–0.2)

## 2018-09-29 LAB — COMPREHENSIVE METABOLIC PANEL
ALT: 35 U/L (ref 0–44)
AST: 33 U/L (ref 15–41)
Albumin: 3.6 g/dL (ref 3.5–5.0)
Alkaline Phosphatase: 77 U/L (ref 38–126)
Anion gap: 9 (ref 5–15)
BUN: 14 mg/dL (ref 6–20)
CO2: 27 mmol/L (ref 22–32)
Calcium: 9.5 mg/dL (ref 8.9–10.3)
Chloride: 103 mmol/L (ref 98–111)
Creatinine, Ser: 0.59 mg/dL (ref 0.44–1.00)
GFR calc Af Amer: >60 mL/min (ref >60)
GFR calc non Af Amer: >60 mL/min (ref >60)
Glucose, Bld: 102 mg/dL — ABNORMAL HIGH (ref 70–99)
Potassium: 3.3 mmol/L — ABNORMAL LOW (ref 3.5–5.1)
Sodium: 139 mmol/L (ref 135–145)
Total Bilirubin: 0.7 mg/dL (ref 0.3–1.2)
Total Protein: 7.4 g/dL (ref 6.5–8.1)

## 2018-09-29 LAB — SARS CORONAVIRUS 2 (TAT 6-24 HRS): SARS Coronavirus 2: NEGATIVE

## 2018-09-29 NOTE — Progress Notes (Addendum)
PCP - Florinda Marker Cardiologist - na  Chest x-ray - na EKG - 04-02-18 Stress Test - na ECHO - 04/09/18 Cardiac Cath - na  Sleep Study - na CPAP -   Fasting Blood Sugar - na Checks Blood Sugar _____ times a day  Blood Thinner Instructions:na Aspirin Instructions:  Anesthesia review: Spoke with Jeneen Rinks, PA , order for anes. Consult : epidural. Discussed with pt. And dos anesthesiologist with review with pt.   Patient denies shortness of breath, fever, cough and chest pain at PAT appointment   Patient verbalized understanding of instructions that were given to them at the PAT appointment. Patient was also instructed that they will need to review over the PAT instructions again at home before surgery.  Pt. To review bowl prep instructions that Dr. Barry Dienes office gave her. Instructed her if any questions to  call them.

## 2018-10-02 ENCOUNTER — Encounter (HOSPITAL_COMMUNITY): Payer: Self-pay | Admitting: Anesthesiology

## 2018-10-02 ENCOUNTER — Inpatient Hospital Stay (HOSPITAL_COMMUNITY): Payer: 59 | Admitting: Physician Assistant

## 2018-10-02 ENCOUNTER — Encounter (HOSPITAL_COMMUNITY): Payer: Self-pay

## 2018-10-02 ENCOUNTER — Other Ambulatory Visit: Payer: Self-pay

## 2018-10-02 ENCOUNTER — Inpatient Hospital Stay (HOSPITAL_COMMUNITY): Payer: 59 | Admitting: Anesthesiology

## 2018-10-02 ENCOUNTER — Inpatient Hospital Stay (HOSPITAL_COMMUNITY)
Admission: RE | Admit: 2018-10-02 | Discharge: 2018-10-11 | DRG: 328 | Disposition: A | Discharge status: Home Health | Payer: 59 | Attending: Surgery | Admitting: Surgery

## 2018-10-02 ENCOUNTER — Encounter (HOSPITAL_COMMUNITY)
Admission: RE | Disposition: A | Discharge status: Home / Self Care | Payer: Self-pay | Source: Home / Self Care | Attending: General Surgery

## 2018-10-02 DIAGNOSIS — D6481 Anemia due to antineoplastic chemotherapy: Secondary | ICD-10-CM | POA: Diagnosis present

## 2018-10-02 DIAGNOSIS — Z90711 Acquired absence of uterus with remaining cervical stump: Secondary | ICD-10-CM

## 2018-10-02 DIAGNOSIS — J42 Unspecified chronic bronchitis: Secondary | ICD-10-CM | POA: Diagnosis present

## 2018-10-02 DIAGNOSIS — C162 Malignant neoplasm of body of stomach: Secondary | ICD-10-CM | POA: Diagnosis present

## 2018-10-02 DIAGNOSIS — Z79890 Hormone replacement therapy (postmenopausal): Secondary | ICD-10-CM

## 2018-10-02 DIAGNOSIS — I10 Essential (primary) hypertension: Secondary | ICD-10-CM | POA: Diagnosis present

## 2018-10-02 DIAGNOSIS — E785 Hyperlipidemia, unspecified: Secondary | ICD-10-CM | POA: Diagnosis present

## 2018-10-02 DIAGNOSIS — Z20828 Contact with and (suspected) exposure to other viral communicable diseases: Secondary | ICD-10-CM | POA: Diagnosis present

## 2018-10-02 DIAGNOSIS — Z87891 Personal history of nicotine dependence: Secondary | ICD-10-CM | POA: Diagnosis not present

## 2018-10-02 DIAGNOSIS — Z79899 Other long term (current) drug therapy: Secondary | ICD-10-CM

## 2018-10-02 DIAGNOSIS — C160 Malignant neoplasm of cardia: Secondary | ICD-10-CM | POA: Diagnosis present

## 2018-10-02 DIAGNOSIS — R6881 Early satiety: Secondary | ICD-10-CM | POA: Diagnosis present

## 2018-10-02 DIAGNOSIS — K9189 Other postprocedural complications and disorders of digestive system: Secondary | ICD-10-CM

## 2018-10-02 DIAGNOSIS — Z9011 Acquired absence of right breast and nipple: Secondary | ICD-10-CM

## 2018-10-02 DIAGNOSIS — Z800 Family history of malignant neoplasm of digestive organs: Secondary | ICD-10-CM

## 2018-10-02 DIAGNOSIS — C163 Malignant neoplasm of pyloric antrum: Principal | ICD-10-CM | POA: Diagnosis present

## 2018-10-02 DIAGNOSIS — E039 Hypothyroidism, unspecified: Secondary | ICD-10-CM | POA: Diagnosis present

## 2018-10-02 DIAGNOSIS — Z903 Acquired absence of stomach [part of]: Secondary | ICD-10-CM

## 2018-10-02 DIAGNOSIS — K219 Gastro-esophageal reflux disease without esophagitis: Secondary | ICD-10-CM | POA: Diagnosis present

## 2018-10-02 DIAGNOSIS — J4520 Mild intermittent asthma, uncomplicated: Secondary | ICD-10-CM | POA: Diagnosis present

## 2018-10-02 DIAGNOSIS — Z8711 Personal history of peptic ulcer disease: Secondary | ICD-10-CM

## 2018-10-02 DIAGNOSIS — R197 Diarrhea, unspecified: Secondary | ICD-10-CM | POA: Diagnosis not present

## 2018-10-02 DIAGNOSIS — Z853 Personal history of malignant neoplasm of breast: Secondary | ICD-10-CM

## 2018-10-02 HISTORY — PX: LAPAROSCOPY: SHX197

## 2018-10-02 HISTORY — PX: GASTRECTOMY: SHX58

## 2018-10-02 HISTORY — PX: GASTROSTOMY: SHX5249

## 2018-10-02 SURGERY — LAPAROSCOPY, DIAGNOSTIC
Anesthesia: General | Site: Abdomen

## 2018-10-02 MED ORDER — PROCHLORPERAZINE MALEATE 10 MG PO TABS
10.0000 mg | ORAL_TABLET | Freq: Four times a day (QID) | ORAL | Status: DC | PRN
  Filled 2018-10-02: qty 1

## 2018-10-02 MED ORDER — KETOROLAC TROMETHAMINE 30 MG/ML IJ SOLN
30.0000 mg | Freq: Four times a day (QID) | INTRAMUSCULAR | Status: AC | PRN
  Administered 2018-10-08 (×2): 30 mg via INTRAVENOUS
  Filled 2018-10-02 (×2): qty 1

## 2018-10-02 MED ORDER — HYDROMORPHONE 1 MG/ML IV SOLN: INTRAVENOUS | Status: DC

## 2018-10-02 MED ORDER — CEFAZOLIN SODIUM-DEXTROSE 2-4 GM/100ML-% IV SOLN
INTRAVENOUS | Status: AC
  Filled 2018-10-02: qty 100

## 2018-10-02 MED ORDER — SUGAMMADEX SODIUM 200 MG/2ML IV SOLN
INTRAVENOUS | Status: DC | PRN
  Administered 2018-10-02: 180 mg via INTRAVENOUS

## 2018-10-02 MED ORDER — DEXAMETHASONE SODIUM PHOSPHATE 10 MG/ML IJ SOLN
INTRAMUSCULAR | Status: AC
  Filled 2018-10-02: qty 1

## 2018-10-02 MED ORDER — ROPIVACAINE HCL 2 MG/ML IJ SOLN
10.0000 mL/h | INTRAMUSCULAR | Status: DC
  Administered 2018-10-02: 10 mL/h via EPIDURAL
  Filled 2018-10-02: qty 200

## 2018-10-02 MED ORDER — BUPIVACAINE-EPINEPHRINE (PF) 0.25% -1:200000 IJ SOLN
INTRAMUSCULAR | Status: AC
  Filled 2018-10-02: qty 30

## 2018-10-02 MED ORDER — MIDAZOLAM HCL 2 MG/2ML IJ SOLN
2.0000 mg | Freq: Once | INTRAMUSCULAR | Status: AC
  Administered 2018-10-02: 2 mg via INTRAVENOUS

## 2018-10-02 MED ORDER — ALBUMIN HUMAN 5 % IV SOLN
INTRAVENOUS | Status: AC
  Filled 2018-10-02: qty 250

## 2018-10-02 MED ORDER — CEFAZOLIN SODIUM-DEXTROSE 2-4 GM/100ML-% IV SOLN
2.0000 g | Freq: Three times a day (TID) | INTRAVENOUS | Status: AC
  Administered 2018-10-02: 2 g via INTRAVENOUS
  Filled 2018-10-02: qty 100

## 2018-10-02 MED ORDER — NALOXONE HCL 0.4 MG/ML IJ SOLN: 0.4000 mg | INTRAMUSCULAR | Status: DC | PRN

## 2018-10-02 MED ORDER — KCL IN DEXTROSE-NACL 20-5-0.45 MEQ/L-%-% IV SOLN
INTRAVENOUS | Status: DC
  Administered 2018-10-02 – 2018-10-03 (×3): via INTRAVENOUS
  Administered 2018-10-04: 1 mL via INTRAVENOUS
  Administered 2018-10-04 – 2018-10-05 (×2): via INTRAVENOUS
  Filled 2018-10-02 (×6): qty 1000

## 2018-10-02 MED ORDER — LACTATED RINGERS IV SOLN
INTRAVENOUS | Status: DC | PRN
  Administered 2018-10-02: 13:00:00 via INTRAVENOUS

## 2018-10-02 MED ORDER — CEFAZOLIN SODIUM-DEXTROSE 2-4 GM/100ML-% IV SOLN
2.0000 g | INTRAVENOUS | Status: AC
  Administered 2018-10-02: 2 g via INTRAVENOUS

## 2018-10-02 MED ORDER — DIPHENHYDRAMINE HCL 50 MG/ML IJ SOLN: 12.5000 mg | Freq: Four times a day (QID) | INTRAMUSCULAR | Status: DC | PRN

## 2018-10-02 MED ORDER — FENTANYL CITRATE (PF) 100 MCG/2ML IJ SOLN
50.0000 ug | Freq: Once | INTRAMUSCULAR | Status: AC
  Administered 2018-10-02: 50 ug via INTRAVENOUS

## 2018-10-02 MED ORDER — ROCURONIUM BROMIDE 10 MG/ML (PF) SYRINGE
PREFILLED_SYRINGE | INTRAVENOUS | Status: DC | PRN
  Administered 2018-10-02 (×2): 20 mg via INTRAVENOUS
  Administered 2018-10-02: 50 mg via INTRAVENOUS

## 2018-10-02 MED ORDER — METHOCARBAMOL 1000 MG/10ML IJ SOLN
500.0000 mg | Freq: Four times a day (QID) | INTRAVENOUS | Status: DC | PRN
  Administered 2018-10-03 – 2018-10-08 (×6): 500 mg via INTRAVENOUS
  Filled 2018-10-02 (×2): qty 500
  Filled 2018-10-02 (×5): qty 5

## 2018-10-02 MED ORDER — KETOROLAC TROMETHAMINE 30 MG/ML IJ SOLN
30.0000 mg | Freq: Four times a day (QID) | INTRAMUSCULAR | Status: AC
  Administered 2018-10-02 – 2018-10-04 (×8): 30 mg via INTRAVENOUS
  Filled 2018-10-02 (×8): qty 1

## 2018-10-02 MED ORDER — ONDANSETRON HCL 4 MG/2ML IJ SOLN
INTRAMUSCULAR | Status: AC
  Filled 2018-10-02: qty 2

## 2018-10-02 MED ORDER — ACETAMINOPHEN 500 MG PO TABS
1000.0000 mg | ORAL_TABLET | ORAL | Status: AC
  Administered 2018-10-02: 10:00:00 1000 mg via ORAL

## 2018-10-02 MED ORDER — LIDOCAINE HCL (PF) 1 % IJ SOLN
INTRAMUSCULAR | Status: AC
  Filled 2018-10-02: qty 30

## 2018-10-02 MED ORDER — DIPHENHYDRAMINE HCL 12.5 MG/5ML PO ELIX: 12.5000 mg | ORAL_SOLUTION | Freq: Four times a day (QID) | ORAL | Status: DC | PRN

## 2018-10-02 MED ORDER — SODIUM CHLORIDE 0.9% FLUSH: 9.0000 mL | INTRAVENOUS | Status: DC | PRN

## 2018-10-02 MED ORDER — ONDANSETRON HCL 4 MG/2ML IJ SOLN: 4.0000 mg | Freq: Four times a day (QID) | INTRAMUSCULAR | Status: DC | PRN

## 2018-10-02 MED ORDER — CHLORHEXIDINE GLUCONATE CLOTH 2 % EX PADS: 6.0000 | MEDICATED_PAD | Freq: Once | CUTANEOUS | Status: DC

## 2018-10-02 MED ORDER — PHENYLEPHRINE 40 MCG/ML (10ML) SYRINGE FOR IV PUSH (FOR BLOOD PRESSURE SUPPORT)
PREFILLED_SYRINGE | INTRAVENOUS | Status: AC
  Filled 2018-10-02: qty 10

## 2018-10-02 MED ORDER — ALBUMIN HUMAN 5 % IV SOLN
12.5000 g | Freq: Once | INTRAVENOUS | Status: AC
  Administered 2018-10-02: 12.5 g via INTRAVENOUS

## 2018-10-02 MED ORDER — GABAPENTIN 300 MG PO CAPS
ORAL_CAPSULE | ORAL | Status: AC
  Administered 2018-10-02: 300 mg via ORAL
  Filled 2018-10-02: qty 1

## 2018-10-02 MED ORDER — FENTANYL CITRATE (PF) 100 MCG/2ML IJ SOLN
INTRAMUSCULAR | Status: AC
  Administered 2018-10-02: 11:00:00 100 ug via INTRAVENOUS
  Filled 2018-10-02: qty 2

## 2018-10-02 MED ORDER — 0.9 % SODIUM CHLORIDE (POUR BTL) OPTIME
TOPICAL | Status: DC | PRN
  Administered 2018-10-02: 13:00:00 2000 mL

## 2018-10-02 MED ORDER — ACETAMINOPHEN 500 MG PO TABS
ORAL_TABLET | ORAL | Status: AC
  Administered 2018-10-02: 1000 mg via ORAL
  Filled 2018-10-02: qty 2

## 2018-10-02 MED ORDER — LACTATED RINGERS IV SOLN
INTRAVENOUS | Status: DC
  Administered 2018-10-02: 10:00:00 via INTRAVENOUS

## 2018-10-02 MED ORDER — ROPIVACAINE HCL 2 MG/ML IJ SOLN
12.0000 mL/h | INTRAMUSCULAR | Status: DC
  Administered 2018-10-02 – 2018-10-06 (×7): 12 mL/h via EPIDURAL
  Filled 2018-10-02 (×9): qty 200

## 2018-10-02 MED ORDER — ALBUMIN HUMAN 5 % IV SOLN
INTRAVENOUS | Status: DC | PRN
  Administered 2018-10-02: 14:00:00 via INTRAVENOUS

## 2018-10-02 MED ORDER — PANTOPRAZOLE SODIUM 40 MG IV SOLR
40.0000 mg | Freq: Every day | INTRAVENOUS | Status: DC
  Administered 2018-10-02 – 2018-10-08 (×7): 40 mg via INTRAVENOUS
  Filled 2018-10-02 (×7): qty 40

## 2018-10-02 MED ORDER — PROCHLORPERAZINE EDISYLATE 10 MG/2ML IJ SOLN: 5.0000 mg | Freq: Four times a day (QID) | INTRAMUSCULAR | Status: DC | PRN

## 2018-10-02 MED ORDER — DEXAMETHASONE SODIUM PHOSPHATE 10 MG/ML IJ SOLN
INTRAMUSCULAR | Status: DC | PRN
  Administered 2018-10-02: 10 mg via INTRAVENOUS

## 2018-10-02 MED ORDER — LIDOCAINE HCL 1 % IJ SOLN
INTRAMUSCULAR | Status: DC | PRN
  Administered 2018-10-02: 60 mL via INTRAMUSCULAR

## 2018-10-02 MED ORDER — PHENYLEPHRINE 40 MCG/ML (10ML) SYRINGE FOR IV PUSH (FOR BLOOD PRESSURE SUPPORT)
PREFILLED_SYRINGE | INTRAVENOUS | Status: DC | PRN
  Administered 2018-10-02: 40 ug via INTRAVENOUS
  Administered 2018-10-02: 80 ug via INTRAVENOUS

## 2018-10-02 MED ORDER — ONDANSETRON HCL 4 MG/2ML IJ SOLN
INTRAMUSCULAR | Status: DC | PRN
  Administered 2018-10-02: 4 mg via INTRAVENOUS

## 2018-10-02 MED ORDER — PROPOFOL 10 MG/ML IV BOLUS
INTRAVENOUS | Status: AC
  Filled 2018-10-02: qty 20

## 2018-10-02 MED ORDER — SUCCINYLCHOLINE CHLORIDE 20 MG/ML IJ SOLN
INTRAMUSCULAR | Status: DC | PRN
  Administered 2018-10-02: 120 mg via INTRAVENOUS

## 2018-10-02 MED ORDER — SUCCINYLCHOLINE CHLORIDE 200 MG/10ML IV SOSY
PREFILLED_SYRINGE | INTRAVENOUS | Status: AC
  Filled 2018-10-02: qty 30

## 2018-10-02 MED ORDER — ALBUTEROL SULFATE (2.5 MG/3ML) 0.083% IN NEBU: 2.5000 mg | INHALATION_SOLUTION | RESPIRATORY_TRACT | Status: DC | PRN

## 2018-10-02 MED ORDER — SODIUM CHLORIDE 0.9 % IV SOLN
INTRAVENOUS | Status: DC | PRN
  Administered 2018-10-02: 13:00:00 25 ug/min via INTRAVENOUS

## 2018-10-02 MED ORDER — FENTANYL CITRATE (PF) 250 MCG/5ML IJ SOLN
INTRAMUSCULAR | Status: DC | PRN
  Administered 2018-10-02: 50 ug via EPIDURAL
  Administered 2018-10-02 (×2): 50 ug via INTRAVENOUS

## 2018-10-02 MED ORDER — LEVOTHYROXINE SODIUM 100 MCG/5ML IV SOLN
75.0000 ug | Freq: Every day | INTRAVENOUS | Status: DC
  Administered 2018-10-03 – 2018-10-09 (×7): 75 ug via INTRAVENOUS
  Filled 2018-10-02 (×7): qty 5

## 2018-10-02 MED ORDER — FENTANYL CITRATE (PF) 100 MCG/2ML IJ SOLN
100.0000 ug | Freq: Once | INTRAMUSCULAR | Status: AC
  Administered 2018-10-02: 11:00:00 100 ug via INTRAVENOUS

## 2018-10-02 MED ORDER — MIDAZOLAM HCL 2 MG/2ML IJ SOLN
INTRAMUSCULAR | Status: AC
  Administered 2018-10-02: 11:00:00 2 mg via INTRAVENOUS
  Filled 2018-10-02: qty 2

## 2018-10-02 MED ORDER — PROPOFOL 10 MG/ML IV BOLUS
INTRAVENOUS | Status: DC | PRN
  Administered 2018-10-02: 150 mg via INTRAVENOUS

## 2018-10-02 MED ORDER — FENTANYL CITRATE (PF) 100 MCG/2ML IJ SOLN
INTRAMUSCULAR | Status: AC
  Filled 2018-10-02: qty 2

## 2018-10-02 MED ORDER — ROCURONIUM BROMIDE 10 MG/ML (PF) SYRINGE
PREFILLED_SYRINGE | INTRAVENOUS | Status: AC
  Filled 2018-10-02: qty 30

## 2018-10-02 MED ORDER — LIDOCAINE 2% (20 MG/ML) 5 ML SYRINGE
INTRAMUSCULAR | Status: AC
  Filled 2018-10-02: qty 5

## 2018-10-02 MED ORDER — MIDAZOLAM HCL 2 MG/2ML IJ SOLN
INTRAMUSCULAR | Status: AC
  Filled 2018-10-02: qty 2

## 2018-10-02 MED ORDER — GABAPENTIN 300 MG PO CAPS
300.0000 mg | ORAL_CAPSULE | ORAL | Status: AC
  Administered 2018-10-02: 10:00:00 300 mg via ORAL

## 2018-10-02 MED ORDER — METOPROLOL TARTRATE 5 MG/5ML IV SOLN: 5.0000 mg | Freq: Four times a day (QID) | INTRAVENOUS | Status: DC | PRN

## 2018-10-02 MED ORDER — FENTANYL CITRATE (PF) 250 MCG/5ML IJ SOLN
INTRAMUSCULAR | Status: AC
  Filled 2018-10-02: qty 5

## 2018-10-02 MED ORDER — ROPIVACAINE HCL 2 MG/ML IJ SOLN
INTRAMUSCULAR | Status: DC | PRN
  Administered 2018-10-02: 4 mL via EPIDURAL
  Administered 2018-10-02: 10 mL via EPIDURAL

## 2018-10-02 SURGICAL SUPPLY — 92 items
BIOPATCH RED 1 DISK 7.0 (GAUZE/BANDAGES/DRESSINGS) ×1 IMPLANT
BIOPATCH RED 1IN DISK 7.0MM (GAUZE/BANDAGES/DRESSINGS) ×1
BLADE 11 SAFETY STRL DISP (BLADE) ×2 IMPLANT
BLADE CLIPPER SURG (BLADE) IMPLANT
CANISTER SUCT 3000ML PPV (MISCELLANEOUS) ×6 IMPLANT
CATH KIT ON-Q SILVERSOAK 7.5 (CATHETERS) IMPLANT
CATH KIT ON-Q SILVERSOAK 7.5IN (CATHETERS) IMPLANT
CHLORAPREP W/TINT 26 (MISCELLANEOUS) ×3 IMPLANT
CLIP VESOCCLUDE LG 6/CT (CLIP) ×4 IMPLANT
CLIP VESOCCLUDE MED 24/CT (CLIP) ×2 IMPLANT
CLIP VESOLOCK LG 6/CT PURPLE (CLIP) IMPLANT
CLIP VESOLOCK MED 6/CT (CLIP) IMPLANT
CLIP VESOLOCK MED LG 6/CT (CLIP) IMPLANT
COVER SURGICAL LIGHT HANDLE (MISCELLANEOUS) ×3 IMPLANT
COVER WAND RF STERILE (DRAPES) ×3 IMPLANT
DECANTER SPIKE VIAL GLASS SM (MISCELLANEOUS) ×6 IMPLANT
DERMABOND ADVANCED (GAUZE/BANDAGES/DRESSINGS) ×2
DERMABOND ADVANCED .7 DNX12 (GAUZE/BANDAGES/DRESSINGS) ×1 IMPLANT
DRAIN CHANNEL 19F RND (DRAIN) ×2 IMPLANT
DRAIN PENROSE 1/2X36 LTX STRL (DRAIN) ×2 IMPLANT
DRAIN PENROSE 18X1/2 LTX STRL (DRAIN) IMPLANT
DRAPE LAPAROSCOPIC ABDOMINAL (DRAPES) ×3 IMPLANT
DRAPE UTILITY XL STRL (DRAPES) IMPLANT
DRAPE WARM FLUID 44X44 (DRAPES) ×3 IMPLANT
DRSG COVADERM 4X10 (GAUZE/BANDAGES/DRESSINGS) IMPLANT
DRSG COVADERM 4X8 (GAUZE/BANDAGES/DRESSINGS) IMPLANT
DRSG OPSITE POSTOP 4X10 (GAUZE/BANDAGES/DRESSINGS) ×2 IMPLANT
DRSG TEGADERM 2-3/8X2-3/4 SM (GAUZE/BANDAGES/DRESSINGS) ×2 IMPLANT
DRSG TEGADERM 4X4.75 (GAUZE/BANDAGES/DRESSINGS) IMPLANT
ELECT BLADE 6.5 EXT (BLADE) ×3 IMPLANT
ELECT REM PT RETURN 9FT ADLT (ELECTROSURGICAL) ×3
ELECTRODE REM PT RTRN 9FT ADLT (ELECTROSURGICAL) ×1 IMPLANT
EVACUATOR SILICONE 100CC (DRAIN) ×2 IMPLANT
GAUZE SPONGE 4X4 12PLY STRL (GAUZE/BANDAGES/DRESSINGS) ×3 IMPLANT
GLOVE BIO SURGEON STRL SZ 6 (GLOVE) ×3 IMPLANT
GLOVE BIO SURGEON STRL SZ7.5 (GLOVE) ×2 IMPLANT
GLOVE BIOGEL PI IND STRL 7.5 (GLOVE) IMPLANT
GLOVE BIOGEL PI INDICATOR 7.5 (GLOVE) ×2
GLOVE INDICATOR 6.5 STRL GRN (GLOVE) ×3 IMPLANT
GOWN STRL REUS W/ TWL LRG LVL3 (GOWN DISPOSABLE) ×2 IMPLANT
GOWN STRL REUS W/TWL 2XL LVL3 (GOWN DISPOSABLE) ×6 IMPLANT
GOWN STRL REUS W/TWL LRG LVL3 (GOWN DISPOSABLE) ×4
KIT BASIN OR (CUSTOM PROCEDURE TRAY) ×3 IMPLANT
KIT TUBE JEJUNAL 16FR (CATHETERS) IMPLANT
KIT TURNOVER KIT B (KITS) ×3 IMPLANT
L-HOOK LAP DISP 36CM (ELECTROSURGICAL) ×3
LHOOK LAP DISP 36CM (ELECTROSURGICAL) ×1 IMPLANT
NS IRRIG 1000ML POUR BTL (IV SOLUTION) ×6 IMPLANT
PACK GENERAL/GYN (CUSTOM PROCEDURE TRAY) ×3 IMPLANT
PAD ARMBOARD 7.5X6 YLW CONV (MISCELLANEOUS) ×6 IMPLANT
PENCIL BUTTON HOLSTER BLD 10FT (ELECTRODE) ×3 IMPLANT
PENCIL SMOKE EVACUATOR (MISCELLANEOUS) ×3 IMPLANT
RELOAD PROXIMATE 75MM BLUE (ENDOMECHANICALS) ×12 IMPLANT
RELOAD STAPLE 75 3.8 BLU REG (ENDOMECHANICALS) IMPLANT
SCISSORS LAP 5X35 DISP (ENDOMECHANICALS) IMPLANT
SET IRRIG TUBING LAPAROSCOPIC (IRRIGATION / IRRIGATOR) IMPLANT
SET TUBE SMOKE EVAC HIGH FLOW (TUBING) ×3 IMPLANT
SHEARS FOC LG CVD HARMONIC 17C (MISCELLANEOUS) ×2 IMPLANT
SLEEVE ENDOPATH XCEL 5M (ENDOMECHANICALS) ×5 IMPLANT
SOLUTION ANTI FOG 6CC (MISCELLANEOUS) ×2 IMPLANT
SPECIMEN JAR LARGE (MISCELLANEOUS) ×3 IMPLANT
SPECIMEN JAR X LARGE (MISCELLANEOUS) ×3 IMPLANT
SPONGE LAP 18X18 RF (DISPOSABLE) ×4 IMPLANT
SPONGE LAP 18X18 X RAY DECT (DISPOSABLE) ×2 IMPLANT
STAPLE ECHEON FLEX 60 POW ENDO (STAPLE) IMPLANT
STAPLER CIRC 25MM 4.8MM THK (STAPLE) ×2 IMPLANT
STAPLER CUT CVD 40MM BLUE (STAPLE) ×2 IMPLANT
STAPLER PROXIMATE 75MM BLUE (STAPLE) ×2 IMPLANT
STAPLER TRANS-ORAL 25MM EEA (STAPLE) ×2 IMPLANT
STAPLER VISISTAT 35W (STAPLE) ×3 IMPLANT
SUT ETHILON 2 0 FS 18 (SUTURE) ×4 IMPLANT
SUT MNCRL AB 4-0 PS2 18 (SUTURE) ×3 IMPLANT
SUT PDS AB 1 TP1 96 (SUTURE) ×6 IMPLANT
SUT PDS AB 3-0 SH 27 (SUTURE) ×8 IMPLANT
SUT SILK 2 0 SH (SUTURE) ×8 IMPLANT
SUT SILK 2 0 SH CR/8 (SUTURE) ×3 IMPLANT
SUT SILK 2 0 TIES 10X30 (SUTURE) ×3 IMPLANT
SUT SILK 3 0 SH 30 (SUTURE) ×4 IMPLANT
SUT SILK 3 0 SH CR/8 (SUTURE) ×5 IMPLANT
SUT SILK 3 0 TIES 10X30 (SUTURE) ×3 IMPLANT
SYRINGE IRR TOOMEY STRL 70CC (SYRINGE) ×2 IMPLANT
TOWEL GREEN STERILE (TOWEL DISPOSABLE) ×3 IMPLANT
TOWEL GREEN STERILE FF (TOWEL DISPOSABLE) ×3 IMPLANT
TRAY FOLEY MTR SLVR 14FR STAT (SET/KITS/TRAYS/PACK) ×3 IMPLANT
TRAY FOLEY MTR SLVR 16FR STAT (SET/KITS/TRAYS/PACK) ×3 IMPLANT
TRAY LAPAROSCOPIC MC (CUSTOM PROCEDURE TRAY) ×3 IMPLANT
TROCAR XCEL BLUNT TIP 100MML (ENDOMECHANICALS) IMPLANT
TROCAR XCEL NON-BLD 11X100MML (ENDOMECHANICALS) IMPLANT
TROCAR XCEL NON-BLD 5MMX100MML (ENDOMECHANICALS) ×3 IMPLANT
TUBE J 18FR (TUBING) ×2 IMPLANT
TUNNELER SHEATH ON-Q 16GX12 DP (PAIN MANAGEMENT) IMPLANT
YANKAUER SUCT BULB TIP NO VENT (SUCTIONS) ×3 IMPLANT

## 2018-10-02 NOTE — Op Note (Signed)
PRE-OPERATIVE DIAGNOSIS: adenocarcinoma of the gastric antrum [id number], s/p neoadjuvant chemo with FLOT  POST-OPERATIVE DIAGNOSIS:  Same  PROCEDURE:  Procedure(s): Diagnostic laparoscopy, total gastrectomy with roux en y esophagojejunostomy, feeding jejunostomy  SURGEON:  Surgeon(s): [name], MD  ASSIST:   Romana Juniper, MD  ANESTHESIA:   epidural and general  DRAINS: Jejunostomy Tube and (18 Fr in left mid abdomen) Blake drain(s) in the central upper abdomen behind the EJ anastamosis exiting the right abdomen   LOCAL MEDICATIONS USED:  BUPIVICAINE  and LIDOCAINE   SPECIMEN:  Source of Specimen:  total gastrectomy  DISPOSITION OF SPECIMEN:  PATHOLOGY  COUNTS:  YES  DICTATION: .Dragon Dictation  PLAN OF CARE: Admit to inpatient   PATIENT DISPOSITION:  PACU - hemodynamically stable.  FINDINGS:  Good response to chemo.  Small residual tumor  EBL: 150 mL  PROCEDURE:    Patient was identified in the holding area and taken the operating room where she was placed supine on the operating room table.  General anesthesia was induced.  Foley catheter was placed.  Her left arm was tucked.  Her abdomen was then prepped and draped in sterile fashion.  Timeout was performed according to the surgical safety checklist.  When all was correct, we continued.  The patient was placed into reverse Trendelenburg position and rotated to the left.  A small incision was made at the left costal margin with a #11 blade.  A 5 mm Optiview trocar was placed under direct visualization.  Pneumoperitoneum was achieved to a pressure of 15 mmHg.  A second 5 mm trocar was placed in the upper midline.  The abdomen was examined carefully.  There was no evidence of carcinomatosis.  The left lateral segment of the liver elevated easily from the stomach.  The stomach was also not tethered to the spleen.  The abdomen was allowed to desufflate.  A vertical midline incision was made with a #10 blade.  Subcutaneous  tissues were divided with the cautery.  The fascia was entered in the midline.  The fascial incision was carried out the length of the skin incision.  The incision was carried to just above the umbilicus.  The liver and peritoneum were palpated and no evidence of metastatic disease was palpated either.  The Bookwalter retractor was set up to assist with visualization.  The stomach and omentum were elevated and the colon was taken off of the omentum, opening up the lesser sac.  Once the stomach and omentum were fully free of the colon, the short gastrics were then taken down with the harmonic scalpel.  The GE junction was freed up from the diaphragm as well.  The pylorus was identified and the stomach mobilized to just beyond the pylorus.  The GIA 75 mm stapler was used to divide the bowel there.  A 3-0 PDS was used to oversew the staple line.  The left gastric was dissected free and the nodal tissue taken with the specimen.  The left gastric vein was clipped and divided with the harmonic scalpel.  The left gastric artery was doubly clamped, divided, and suture ligated as well as clipped.  The tumor was quite small at this point and was identified on the greater curve around 5 cm from the GE junction.  Stay sutures were placed on either side of the esophagus.  The GE junction was divided with the blue load of the contour stapler.  The stomach was placed on a back table and opened.  The tumor was  seen to be in the specimen and did appear to be around 4 to 5 cm from the staple line.  This was sent for frozen section.  While waiting for frozen section, a Roux-en-Y limb was created for reconstruction.  The bowel was divided with the GIA 75 mm stapler approximately 20 cm beyond the ligament of Treitz.  A small amount of the mesentery was divided with the harmonic.  Care was taken to make sure there was adequate length on the Roux limb.  Around 30 cm beyond the Roux limb was identified and selected as a site for a  J-tube.  The J-tube was advanced through the left lateral abdominal wall.  A pursestring suture of 3-0 silk was placed in the bowel.  The bowel was opened and the J-tube threaded through making sure to go distally.  The pursestring suture was tied down.  Several Witzel sutures were placed.  The J-tube site was tethered to the abdominal wall then with interrupted 2-0 silk sutures.  The flange was placed flush with the abdominal wall and secured with interrupted 2-0 Vicryls.  At this point, the frozen section on the proximal margin returned as negative.  The 25 mm EEA stapler was selected with the orogastric connector.  This was passed by anesthesia through the esophagus.  The tip was taken out anterior to the staple line carefully.  This was pulled gently until the anvil was at the end of the esophagus.  The suture was cut at the end of the anvil.  The tip of the Roux limb was opened and the 25 mm EEA passed through the Roux limb.  A side-to-end EEA anastomosis was created.  This was reinforced with interrupted 2-0 silk sutures.  The segment where the stapler had been passed through it was divided with the GIA 75 mm stapler.  The extra mesentery was divided with the harmonic scalpel.  A 19 [place] Blake drain was passed behind the anastomosis and secured on the right side of the abdomen.  The abdomen was irrigated with water irrigation and saline.  The fascia was then closed using running #1 looped PDS suture.  The skin was irrigated and closed with staples.  The wounds were then cleaned, dried, and dressed with soft sterile dressings.  Needle, wound, and instrument counts were correct x2.  The patient was allowed to emerge from anesthesia and was taken to the PACU in stable condition.

## 2018-10-02 NOTE — Transfer of Care (Signed)
Immediate Anesthesia Transfer of Care Note  Patient: Mariah Kemp  Procedure(s) Performed: LAPAROSCOPY DIAGNOSTIC (N/A Abdomen) TOTAL GASTRECTOMY (N/A Abdomen) FEEDING TUBE PLACEMENT (N/A Abdomen)  Patient Location: PACU  Anesthesia Type:General  Level of Consciousness: drowsy  Airway & Oxygen Therapy: Patient Spontanous Breathing and Patient connected to face mask oxygen  Post-op Assessment: Report given to RN and Post -op Vital signs reviewed and stable  Post vital signs: Reviewed and stable  Last Vitals:  Vitals Value Taken Time  BP 105/56 10/02/18 1539  Temp    Pulse 72 10/02/18 1543  Resp 16 10/02/18 1543  SpO2 100 % 10/02/18 1543  Vitals shown include unvalidated device data.  Last Pain:  Vitals:   10/02/18 1040  TempSrc:   PainSc: 0-No pain      Patients Stated Pain Goal: (P) 7 (84/72/07 2182)  Complications: No apparent anesthesia complications

## 2018-10-02 NOTE — Anesthesia Procedure Notes (Signed)
Procedure Name: Intubation Date/Time: 10/02/2018 12:35 PM Performed by: Bryson Corona, CRNA Pre-anesthesia Checklist: Patient identified, Emergency Drugs available, Suction available and Patient being monitored Patient Re-evaluated:Patient Re-evaluated prior to induction Oxygen Delivery Method: Circle System Utilized Preoxygenation: Pre-oxygenation with 100% oxygen Induction Type: IV induction and Rapid sequence Laryngoscope Size: Mac and 3 Grade View: Grade I Tube type: Oral Tube size: 7.0 mm Number of attempts: 1 Airway Equipment and Method: Stylet and Oral airway Placement Confirmation: ETT inserted through vocal cords under direct vision,  positive ETCO2 and breath sounds checked- equal and bilateral Secured at: 22 cm Tube secured with: Tape Dental Injury: Teeth and Oropharynx as per pre-operative assessment

## 2018-10-02 NOTE — Progress Notes (Signed)
I arrived back to the PACU at 1746 pm, we arrived to [place] at 1734 pm, left patient in stable condition, Alert and Oriented x 4, and no pain.

## 2018-10-02 NOTE — Progress Notes (Signed)
500 mL total of Albumin and 50 mcg of fentanyl given per verbal order of Tobias Alexander MD. Tobias Alexander MD increased rate of epidural drip to 12 mL/hr.

## 2018-10-02 NOTE — Anesthesia Postprocedure Evaluation (Signed)
Anesthesia Post Note  Patient: Mariah Kemp  Procedure(s) Performed: LAPAROSCOPY DIAGNOSTIC (N/A Abdomen) TOTAL GASTRECTOMY (N/A Abdomen) FEEDING TUBE PLACEMENT (N/A Abdomen)     Patient location during evaluation: PACU Anesthesia Type: General Level of consciousness: sedated Pain management: pain level controlled Vital Signs Assessment: post-procedure vital signs reviewed and stable Respiratory status: spontaneous breathing and respiratory function stable Cardiovascular status: stable Postop Assessment: no apparent nausea or vomiting Anesthetic complications: no    Last Vitals:  Vitals:   10/02/18 1715 10/02/18 1759  BP: (!) 96/59 98/71  Pulse: 75 79  Resp: 12   Temp:    SpO2: 100% 100%    Last Pain:  Vitals:   10/02/18 1655  TempSrc:   PainSc: 0-No pain                 Adley Castello DANIEL

## 2018-10-02 NOTE — Anesthesia Preprocedure Evaluation (Addendum)
Anesthesia Evaluation  Patient identified by MRN, date of birth, ID band Patient awake    Reviewed: Allergy & Precautions, NPO status , Patient's Chart, lab work & pertinent test results  History of Anesthesia Complications Negative for: history of anesthetic complications  Airway Mallampati: II  TM Distance: >3 FB Neck ROM: Full    Dental no notable dental hx. (+) Dental Advisory Given   Pulmonary asthma , former smoker,    Pulmonary exam normal        Cardiovascular hypertension, Pt. on medications Normal cardiovascular exam  TTE 04/09/18: mild LVH, EF 42-35%, grade 1 diastolic dysfunction, mild TR   Neuro/Psych negative neurological ROS     GI/Hepatic Neg liver ROS, PUD, Gastric ca   Endo/Other  Hypothyroidism   Renal/GU negative Renal ROS     Musculoskeletal negative musculoskeletal ROS (+)   Abdominal   Peds  Hematology  (+) anemia , Hgb 6.3 today, will get 1u pRBCs prior to OR   Anesthesia Other Findings Day of surgery medications reviewed with the patient.  Reproductive/Obstetrics                            Anesthesia Physical  Anesthesia Plan  ASA: III  Anesthesia Plan: General   Post-op Pain Management:    Induction: Intravenous  PONV Risk Score and Plan: 3 and Treatment may vary due to age or medical condition, Ondansetron, Dexamethasone and Scopolamine patch - Pre-op  Airway Management Planned: Oral ETT  Additional Equipment:   Intra-op Plan:   Post-operative Plan: Extubation in OR  Informed Consent: I have reviewed the patients History and Physical, chart, labs and discussed the procedure including the risks, benefits and alternatives for the proposed anesthesia with the patient or authorized representative who has indicated his/her understanding and acceptance.     Dental advisory given  Plan Discussed with: CRNA, Anesthesiologist and Surgeon  Anesthesia  Plan Comments:        Anesthesia Quick Evaluation

## 2018-10-02 NOTE — Anesthesia Procedure Notes (Signed)
Epidural Patient location during procedure: holding area Start time: 10/02/2018 10:46 AM End time: 10/02/2018 10:56 AM  Staffing Anesthesiologist: Duane Boston, MD Performed: anesthesiologist   Preanesthetic Checklist Completed: patient identified, site marked, pre-op evaluation, timeout performed, IV checked, risks and benefits discussed and monitors and equipment checked  Epidural Patient position: sitting Prep: DuraPrep Patient monitoring: heart rate, cardiac monitor, continuous pulse ox and blood pressure Approach: midline Location: thoracic (1-12) Injection technique: LOR saline  Needle:  Needle type: Tuohy  Needle gauge: 17 G Needle length: 9 cm Needle insertion depth: 5 cm Catheter size: 20 Guage Catheter at skin depth: 10 cm Test dose: negative and 2% lidocaine with Epi 1:200 K  Assessment Events: blood not aspirated, injection not painful, no injection resistance and negative IV test  Additional Notes Informed consent obtained prior to proceeding including risk of failure, 1% risk of PDPH, risk of minor discomfort and bruising.  Discussed rare but serious complications including epidural abscess, permanent nerve injury, epidural hematoma.  Discussed alternatives to epidural analgesia and patient desires to proceed.  Timeout performed pre-procedure verifying patient name, procedure, and platelet count.  Patient tolerated procedure well.

## 2018-10-02 NOTE — Interval H&P Note (Signed)
History and Physical Interval Note:  10/02/2018 9:25 AM  Mariah Kemp  has presented today for surgery, with the diagnosis of STOMACH CANCER.  The various methods of treatment have been discussed with the patient and family. After consideration of risks, benefits and other options for treatment, the patient has consented to  Procedure(s): LAPAROSCOPY DIAGNOSTIC (N/A) SUBTOTAL VS TOTAL GASTRECTOMY (N/A) FEEDING TUBE PLACEMENT (N/A) as a surgical intervention.  The patient's history has been reviewed, patient examined, no change in status, stable for surgery.  I have reviewed the patient's chart and labs.  Questions were answered to the patient's satisfaction.     [name]

## 2018-10-02 NOTE — Progress Notes (Signed)
Moser MD cleared patient go to [address], patient is stable, vitals are within the goal pressures, pt is Alert and oriented x 4, and no pain.

## 2018-10-03 ENCOUNTER — Encounter (HOSPITAL_COMMUNITY): Payer: Self-pay | Admitting: General Surgery

## 2018-10-03 LAB — BASIC METABOLIC PANEL
Anion gap: 8 (ref 5–15)
BUN: 20 mg/dL (ref 6–20)
CO2: 23 mmol/L (ref 22–32)
Calcium: 8.6 mg/dL — ABNORMAL LOW (ref 8.9–10.3)
Chloride: 104 mmol/L (ref 98–111)
Creatinine, Ser: 1.06 mg/dL — ABNORMAL HIGH (ref 0.44–1.00)
GFR calc Af Amer: >60 mL/min (ref >60)
GFR calc non Af Amer: 59 mL/min — ABNORMAL LOW (ref >60)
Glucose, Bld: 186 mg/dL — ABNORMAL HIGH (ref 70–99)
Potassium: 4.7 mmol/L (ref 3.5–5.1)
Sodium: 135 mmol/L (ref 135–145)

## 2018-10-03 LAB — CBC
HCT: 25.6 % — ABNORMAL LOW (ref 36.0–46.0)
Hemoglobin: 8.9 g/dL — ABNORMAL LOW (ref 12.0–15.0)
MCH: 33.6 pg (ref 26.0–34.0)
MCHC: 34.8 g/dL (ref 30.0–36.0)
MCV: 96.6 fL (ref 80.0–100.0)
Platelets: 120 10*3/uL — ABNORMAL LOW (ref 150–400)
RBC: 2.65 MIL/uL — ABNORMAL LOW (ref 3.87–5.11)
RDW: 11.8 % (ref 11.5–15.5)
WBC: 7.1 10*3/uL (ref 4.0–10.5)
nRBC: 0 % (ref 0.0–0.2)

## 2018-10-03 MED ORDER — OSMOLITE 1.5 CAL PO LIQD
1000.0000 mL | ORAL | Status: DC
  Administered 2018-10-04: 1000 mL
  Filled 2018-10-03 (×2): qty 1000

## 2018-10-03 MED ORDER — PHENOL 1.4 % MT LIQD
1.0000 | OROMUCOSAL | Status: DC | PRN
  Filled 2018-10-03: qty 177

## 2018-10-03 MED ORDER — MORPHINE SULFATE (PF) 2 MG/ML IV SOLN
1.0000 mg | INTRAVENOUS | Status: DC | PRN
  Administered 2018-10-03: 1 mg via INTRAVENOUS
  Administered 2018-10-03: 1.5 mg via INTRAVENOUS
  Administered 2018-10-04 – 2018-10-07 (×9): 2 mg via INTRAVENOUS
  Filled 2018-10-03 (×11): qty 1

## 2018-10-03 NOTE — Progress Notes (Signed)
Pt's urinary output at 0100 is less than 30 ml from urine catheter.  Bladder scan showed 98.  Rechecked pt's urinary output at 0600 is 100 ml. Bladder scan shows 457. Notified DR Annye English, ordered to flash urinary catheter.  Flashed done, will monitor.

## 2018-10-03 NOTE — Anesthesia Post-op Follow-up Note (Signed)
  Anesthesia Pain Follow-up Note  Patient: Mariah Kemp  Day #: 1  Date of Follow-up: 10/03/2018 Time: 7:51 PM  Last Vitals:  Vitals:   10/03/18 1552 10/03/18 1819  BP: (!) 98/58 117/72  Pulse: 87 89  Resp: 16 15  Temp: 36.8 C 36.9 C  SpO2: 97% 97%    Level of Consciousness: alert  Pain: mild   Side Effects:None  Catheter Site Exam:clean, dry  Epidural / Intrathecal (From admission, onward)   Start     Dose/Rate Route Frequency Ordered Stop   10/02/18 1845  ropivacaine (PF) 2 mg/mL (0.2%) (NAROPIN) injection     12 mL/hr 12 mL/hr  Epidural Continuous 10/02/18 1833         Plan: Continue current therapy of postop epidural at surgeon's request  Jennfer Gassen

## 2018-10-03 NOTE — Progress Notes (Signed)
Patient ID: Mariah Kemp, female   DOB: December 11, 1960, 58 y.o.   MRN: [id number] [facility] Progress Note:   1 Day Post-Op  Subjective: Mental status is clear; questions answered about surgery.   Objective: Vital signs in last 24 hours: Temp:  [97.8 F (36.6 C)-98.3 F (36.8 C)] 97.8 F (36.6 C) (07/18 0503) Pulse Rate:  [67-105] 93 (07/18 0503) Resp:  [10-20] 16 (07/18 0503) BP: (71-134)/(45-76) 97/59 (07/18 0503) SpO2:  [96 %-100 %] 96 % (07/18 0503) Weight:  [63.8 kg] 63.8 kg (07/17 0921)  Intake/Output from previous day: 07/17 0701 - 07/18 0700 In: 3470.1 [I.V.:2740.1; IV Piggyback:552.1] Out: 630 [Urine:310; Drains:170; Blood:150] Intake/Output this shift: No intake/output data recorded.  Physical Exam: Work of breathing is normal.  No complaints of abdominal pain.  Epidural is working well  Lab Results:  Results for orders placed or performed during the hospital encounter of 10/02/18 (from the past 48 hour(s))  CBC     Status: Abnormal   Collection Time: 10/03/18  4:24 AM  Result Value Ref Range   WBC 7.1 4.0 - 10.5 K/uL   RBC 2.65 (L) 3.87 - 5.11 MIL/uL   Hemoglobin 8.9 (L) 12.0 - 15.0 g/dL   HCT 25.6 (L) 36.0 - 46.0 %   MCV 96.6 80.0 - 100.0 fL   MCH 33.6 26.0 - 34.0 pg   MCHC 34.8 30.0 - 36.0 g/dL   RDW 11.8 11.5 - 15.5 %   Platelets 120 (L) 150 - 400 K/uL   nRBC 0.0 0.0 - 0.2 %    Comment: Performed at [facility], [place] [address] [place], [place] [address]  Basic metabolic panel     Status: Abnormal   Collection Time: 10/03/18  4:24 AM  Result Value Ref Range   Sodium 135 135 - 145 mmol/L   Potassium 4.7 3.5 - 5.1 mmol/L   Chloride 104 98 - 111 mmol/L   CO2 23 22 - 32 mmol/L   Glucose, Bld 186 (H) 70 - 99 mg/dL   BUN 20 6 - 20 mg/dL   Creatinine, Ser 1.06 (H) 0.44 - 1.00 mg/dL   Calcium 8.6 (L) 8.9 - 10.3 mg/dL   GFR calc non Af Amer 59 (L) >60 mL/min   GFR calc Af Amer >60 >60 mL/min   Anion gap 8 5 - 15    Comment: Performed at  [facility] [address], [place],  [address]    Radiology/Results: No results found.  Anti-infectives: Anti-infectives (From admission, onward)   Start     Dose/Rate Route Frequency Ordered Stop   10/02/18 2200  ceFAZolin (ANCEF) IVPB 2g/100 mL premix     2 g 200 mL/hr over 30 Minutes Intravenous Every 8 hours 10/02/18 1748 10/02/18 2117   10/02/18 0945  ceFAZolin (ANCEF) IVPB 2g/100 mL premix     2 g 200 mL/hr over 30 Minutes Intravenous On call to O.R. 10/02/18 0934 10/02/18 1237   10/02/18 0937  ceFAZolin (ANCEF) 2-4 GM/100ML-% IVPB    Note to Pharmacy: Lorne Skeens   : cabinet override      10/02/18 0937 10/02/18 1237      Assessment/Plan: Problem List: Patient Active Problem List   Diagnosis Date Noted  . S/P gastrectomy 10/02/2018  . 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 Port-A-Cath in place 05/13/2018  . Iron deficiency anemia due to chronic blood loss 04/03/2018  . Gastric cancer ([place]) 04/02/2018  . Acute upper GI bleed 03/31/2018  .  Acute gastric ulcer without hemorrhage, perforation or obstruction 03/31/2018  . Symptomatic anemia 03/31/2018  . B12 deficiency 03/31/2018  . Gastrointestinal hemorrhage   . Palpitations 03/27/2018  . Moderate intermittent asthma without complication 04/59/1368  . Mixed hyperlipidemia 09/24/2016  . Elevated fasting glucose 09/24/2016  . Cold sore 04/02/2016  . Nasal polyposis 09/07/2014  . Hypertriglyceridemia 08/24/2014  . Hypothyroidism 08/24/2014  . History of breast cancer 08/19/2014  . Chronic bronchitis ([place]) 05/31/2014  . Essential hypertension 05/31/2014    Stable post op.  Plan to start tube feedings through jejunostomy tube tomorrow 1 Day Post-Op    LOS: 1 day   Matt B. Hassell Done, MD, [facility] [phone] beeper [phone]  10/03/2018 9:01 AM

## 2018-10-03 NOTE — Progress Notes (Signed)
Initial Nutrition Assessment  DOCUMENTATION CODES:  Not applicable  INTERVENTION:  Per MD, start trickle feeds tomorrow morning:  Osmolite 1.5 @20  cc/hr. TF will provide 720 kcals, 30 gm protein, 366 ml free water daily.  Once tolerance established can either increase rate or add modulars to better meet needs. Goal rate would be Osmolite 1.5 @ 50 w/ PS BID. For 2000 kcals, 105g Pro and 914 mls fluid    Given she is s/p total gastrectomy- pt will need lifelong vit/min supplementation. Would recommend 300-500 mcg sublingual b12, 1500mg  calcium citrate (total per day) and 2 general mvis.   RD gave introduction to the nutritional lifestyle changes she will need to follow going forward.   NUTRITION DIAGNOSIS:  Inadequate oral intake related to inability to eat as evidenced by NPO status.  GOAL:  Patient will meet greater than or equal to 90% of their needs  MONITOR:  Skin, Diet advancement, TF tolerance, Supplement acceptance, Weight trends, Labs, PO intake, I & O's  REASON FOR ASSESSMENT:  Consult Assessment of nutrition requirement/status, Enteral/tube feeding initiation and management  ASSESSMENT:  58 y/o female PMHx HTN and gastric cancer. Pt diagnosed this past January. Treated w/ neoadjuvant chemotherapy 1/29-5/6. Underwent total gastrectomy with roux-en-y esophagojejunostomy and placement of feeding jejunostomy 7/17.     RD consulted for initiation of TF. Per surgeon, TF to be started 7/19 and held at trickle rate of 20 cc/hr.   RD operating remotely due to covid precautions. Was able to reach patient by phone. She is well awake and open to discussion.   We reviewed her preop nutrition history. She says during her chemo, she had extremely poor intake. She says this was due to nausea and taste changes. She couldn't tolerate much other than canned soup. She says she "lived on" condensed soup and eventually started drinking Boost supplements as well. However, after her chemo ended,  she says she immediately began to eat better and "has been eating like a pig" since her chemo ended.   Pt had not anticipated having complete loss of her stomach and was very interested in how this would impact her life going forward. RD reviewed several different roles of the stomach. She will have impaired absorption and likely require life long supplementation with several vit/minerals. These were listed for her. We discussed how her eating behavior will need to adapt. She will need to chew her feed extremely well before swallowing, separate fluids from meals and eat small amount at a time. She will likely initially not be able to tolerate high fat, high fiber or spicy foods.   Regarding her feeding tube, RD reviewed the formula she would be started on. She was curious if she would need this for the rest of her life. We discussed TF weaning. Gave recommendations for oral supplements. In event she is unable to take sufficient intake by mouth, she would need to continue on tf. Unfortunately, noted this would need to be via pump, which she could run at night if needed.  Pt is understandably overwhelmed, but she is still positive and lighthearted during our discussion. She was very thankful for the information. RD stated she would be seen by separate RD next week, who can provide physical handouts to her. She says she was told she will be in the hospital 10 days.   Per chart, pt was 167-172 lbs at the time of her dx. She gradually lost weight during chemotherapy and was 140.6 lbs preoperatively. This loss of ~25 lbs  equates to ~15% of her bw x 6 months. This meets malnutrition criteria. Though, does not meet any other criteria for dx of malnutrition.  Given rate is to be held at 20, will start with calorically dense, fiber free formula. Will hold off on addition of modulars until tolerance of the formula itself is established, as prostat can occasionally cause GI upset.   Labs: Albumin:3.6, BG:186,  hgb:12.3->8.9, BUN/Creat: 14/.59->1.06 Meds: Toradol, ppi. IVF  Recent Labs  Lab 09/29/18 0940 10/03/18 0424  NA 139 135  K 3.3* 4.7  CL 103 104  CO2 27 23  BUN 14 20  CREATININE 0.59 1.06*  CALCIUM 9.5 8.6*  GLUCOSE 102* 186*   NUTRITION - FOCUSED PHYSICAL EXAM: Unable to conduct at this time  Diet Order:   Diet Order            Diet NPO time specified  Diet effective now             EDUCATION NEEDS:  Not appropriate for education at this time  Skin: Surgical Incision to abdomen  Last BM:  Unknown PTA  Height:  Ht Readings from Last 1 Encounters:  10/02/18 5\' 1"  (1.549 m)   Weight:  Wt Readings from Last 1 Encounters:  10/02/18 63.8 kg   Wt Readings from Last 10 Encounters:  10/02/18 63.8 kg  09/29/18 63.8 kg  08/24/18 64 kg  07/22/18 65.6 kg  07/08/18 68.1 kg  06/24/18 70.4 kg  06/10/18 71.1 kg  05/27/18 74.4 kg  05/13/18 75.3 kg  04/22/18 73.5 kg   Ideal Body Weight:     BMI:  Body mass index is 26.59 kg/m.  Estimated Nutritional Needs:  Kcal:  1800-2000 kcals (30 kcal/kg +/- 100) Protein:  95-110g Pro (1.5-1.7g/kg bw) Fluid:  >1.8 L (30 ml/kg bw)  Burtis Junes RD, LDN, CNSC Clinical Nutrition Available Tues-Sat via Pager: [phone] 10/03/2018 12:14 PM

## 2018-10-03 NOTE — Progress Notes (Signed)
BP 82/49. Patient asymptomatic, not in any distress. Urine output good. Md made aware.

## 2018-10-04 LAB — CBC
HCT: 22.4 % — ABNORMAL LOW (ref 36.0–46.0)
Hemoglobin: 7.9 g/dL — ABNORMAL LOW (ref 12.0–15.0)
MCH: 34.2 pg — ABNORMAL HIGH (ref 26.0–34.0)
MCHC: 35.3 g/dL (ref 30.0–36.0)
MCV: 97 fL (ref 80.0–100.0)
Platelets: 106 10*3/uL — ABNORMAL LOW (ref 150–400)
RBC: 2.31 MIL/uL — ABNORMAL LOW (ref 3.87–5.11)
RDW: 11.9 % (ref 11.5–15.5)
WBC: 6 10*3/uL (ref 4.0–10.5)
nRBC: 0 % (ref 0.0–0.2)

## 2018-10-04 LAB — BASIC METABOLIC PANEL
Anion gap: 7 (ref 5–15)
BUN: 23 mg/dL — ABNORMAL HIGH (ref 6–20)
CO2: 23 mmol/L (ref 22–32)
Calcium: 8.7 mg/dL — ABNORMAL LOW (ref 8.9–10.3)
Chloride: 107 mmol/L (ref 98–111)
Creatinine, Ser: 1.16 mg/dL — ABNORMAL HIGH (ref 0.44–1.00)
GFR calc Af Amer: >60 mL/min (ref >60)
GFR calc non Af Amer: 53 mL/min — ABNORMAL LOW (ref >60)
Glucose, Bld: 111 mg/dL — ABNORMAL HIGH (ref 70–99)
Potassium: 4 mmol/L (ref 3.5–5.1)
Sodium: 137 mmol/L (ref 135–145)

## 2018-10-04 MED ORDER — OSMOLITE 1.5 CAL PO LIQD: 1000.0000 mL | ORAL | Status: DC

## 2018-10-04 NOTE — Progress Notes (Signed)
2 Days Post-Op   Subjective/Chief Complaint: Comfortable this morning No flatus   Objective: Vital signs in last 24 hours: Temp:  [98.1 F (36.7 C)-98.4 F (36.9 C)] 98.1 F (36.7 C) (07/19 0615) Pulse Rate:  [84-93] 84 (07/19 0615) Resp:  [15-18] 18 (07/19 0615) BP: (83-117)/(42-72) 106/71 (07/19 0615) SpO2:  [97 %] 97 % (07/19 0615)    Intake/Output from previous day: 07/18 0701 - 07/19 0700 In: 1545.7 [I.V.:1368.3] Out: 2065 [Urine:2000; Drains:65] Intake/Output this shift: No intake/output data recorded.  Exam: Awake and alert Abdomen soft, drains stable  Lab Results:  Recent Labs    10/03/18 0424 10/04/18 0226  WBC 7.1 6.0  HGB 8.9* 7.9*  HCT 25.6* 22.4*  PLT 120* 106*   BMET Recent Labs    10/03/18 0424 10/04/18 0226  NA 135 137  K 4.7 4.0  CL 104 107  CO2 23 23  GLUCOSE 186* 111*  BUN 20 23*  CREATININE 1.06* 1.16*  CALCIUM 8.6* 8.7*   PT/INR No results for input(s): LABPROT, INR in the last 72 hours. ABG No results for input(s): PHART, HCO3 in the last 72 hours.  Invalid input(s): PCO2, PO2  Studies/Results: No results found.  Anti-infectives: Anti-infectives (From admission, onward)   Start     Dose/Rate Route Frequency Ordered Stop   10/02/18 2200  ceFAZolin (ANCEF) IVPB 2g/100 mL premix     2 g 200 mL/hr over 30 Minutes Intravenous Every 8 hours 10/02/18 1748 10/02/18 2117   10/02/18 0945  ceFAZolin (ANCEF) IVPB 2g/100 mL premix     2 g 200 mL/hr over 30 Minutes Intravenous On call to O.R. 10/02/18 0934 10/02/18 1237   10/02/18 0937  ceFAZolin (ANCEF) 2-4 GM/100ML-% IVPB    Note to Pharmacy: Lorne Skeens   : cabinet override      10/02/18 0937 10/02/18 1237      Assessment/Plan: s/p Procedure(s): LAPAROSCOPY DIAGNOSTIC (N/A) TOTAL GASTRECTOMY (N/A) FEEDING TUBE PLACEMENT (N/A)  Continue NG Start tube feeds Continue foley  LOS: 2 days    Coralie Keens 10/04/2018

## 2018-10-04 NOTE — Anesthesia Post-op Follow-up Note (Signed)
  Anesthesia Pain Follow-up Note  Patient: Delana Manganello  Day #: 2  Date of Follow-up: 10/04/2018 Time: 10:28 AM  Last Vitals:  Vitals:   10/04/18 0615 10/04/18 0926  BP: 106/71   Pulse: 84   Resp: 18 18  Temp: 36.7 C   SpO2: 97% 96%    Level of Consciousness: alert, conversant, pt relates epidural is working well  Pain: mild   Side Effects:None  Catheter Site Exam:clean, dry, no drainage  Epidural / Intrathecal (From admission, onward)   Start     Dose/Rate Route Frequency Ordered Stop   10/02/18 1845  ropivacaine (PF) 2 mg/mL (0.2%) (NAROPIN) injection     12 mL/hr 12 mL/hr  Epidural Continuous 10/02/18 1833         Plan: Continue current therapy of postop epidural at surgeon's request  Gavina Dildine,E. Cace Osorto

## 2018-10-04 NOTE — Progress Notes (Signed)
Pt no nausea /vomiting since I started the tube feeding via J-tube, pt alert and oriented, no s/s of distress noted.

## 2018-10-05 LAB — BASIC METABOLIC PANEL
Anion gap: 6 (ref 5–15)
BUN: 14 mg/dL (ref 6–20)
CO2: 20 mmol/L — ABNORMAL LOW (ref 22–32)
Calcium: 8.4 mg/dL — ABNORMAL LOW (ref 8.9–10.3)
Chloride: 109 mmol/L (ref 98–111)
Creatinine, Ser: 0.69 mg/dL (ref 0.44–1.00)
GFR calc Af Amer: >60 mL/min (ref >60)
GFR calc non Af Amer: >60 mL/min (ref >60)
Glucose, Bld: 350 mg/dL — ABNORMAL HIGH (ref 70–99)
Potassium: 5.3 mmol/L — ABNORMAL HIGH (ref 3.5–5.1)
Sodium: 135 mmol/L (ref 135–145)

## 2018-10-05 LAB — CBC
HCT: 22.4 % — ABNORMAL LOW (ref 36.0–46.0)
Hemoglobin: 7.8 g/dL — ABNORMAL LOW (ref 12.0–15.0)
MCH: 34.1 pg — ABNORMAL HIGH (ref 26.0–34.0)
MCHC: 34.8 g/dL (ref 30.0–36.0)
MCV: 97.8 fL (ref 80.0–100.0)
Platelets: 110 10*3/uL — ABNORMAL LOW (ref 150–400)
RBC: 2.29 MIL/uL — ABNORMAL LOW (ref 3.87–5.11)
RDW: 11.8 % (ref 11.5–15.5)
WBC: 5.3 10*3/uL (ref 4.0–10.5)
nRBC: 0 % (ref 0.0–0.2)

## 2018-10-05 MED ORDER — DEXTROSE-NACL 5-0.45 % IV SOLN
INTRAVENOUS | Status: DC
  Administered 2018-10-05: 1 mL via INTRAVENOUS
  Administered 2018-10-06 – 2018-10-09 (×4): via INTRAVENOUS

## 2018-10-05 NOTE — Progress Notes (Signed)
Nutrition Follow-up  DOCUMENTATION CODES:   Not applicable  INTERVENTION:   -RD will follow-up with plan to resume TF -If pt continues to poorly tolerate TF, consider initiation of semi-elemental formula to promote tolerance  NUTRITION DIAGNOSIS:   Inadequate oral intake related to inability to eat as evidenced by NPO status.  Ongoing  GOAL:   Patient will meet greater than or equal to 90% of their needs  Progressing   MONITOR:   Diet advancement, Labs, Weight trends, TF tolerance, Skin, I & O's  REASON FOR ASSESSMENT:   Consult Assessment of nutrition requirement/status, Enteral/tube feeding initiation and management  ASSESSMENT:   58 y/o female PMHx HTN and gastric cancer. Pt diagnosed this past January. Treated w/ neoadjuvant chemotherapy 1/29-5/6. Underwent total gastrectomy with roux-en-y esophagojejunostomy and placement of feeding jejunostomy 7/17.  7/17- s/p total gastrectomy with roux-en-y esophagojejunostomy and placement of feeding jejunostomy 7/17  7/19- TF started  Reviewed I/O's: -512 ml x 24 hours and +1.8 L since admission  UOP: 1.8 L x 24 hours  NGT output:: 0 ml x 24 hours  Drain output: 10 ml x 24 hours  Per MD notes, awaiting pathology.   Case discussed with RN, who reports TF were started yesterday, however, were stopped after periods of a few hours due to abdominal pain and gurgling. Pt has not had flatus yet. Per RN and MD, plan to hold TF until pt has flatus and plan for upper GI tomorrow.   Pt sleeping soundly at time of visit and did not respond to voice. RD will defer further education until pt approaches closer to discharge and nutrition care plan is established.   IVFS: dextrose 5% and 0.45 NaCl with KCl 20 mEq/L infusion @ 75 ml/hr  Labs reviewed: K: 5.3.   NUTRITION - FOCUSED PHYSICAL EXAM:    Most Recent Value  Orbital Region  No depletion  Upper Arm Region  No depletion  Thoracic and Lumbar Region  No depletion  Buccal  Region  No depletion  Temple Region  No depletion  Clavicle Bone Region  No depletion  Clavicle and Acromion Bone Region  No depletion  Scapular Bone Region  No depletion  Dorsal Hand  No depletion  Patellar Region  No depletion  Anterior Thigh Region  No depletion  Posterior Calf Region  No depletion  Edema (RD Assessment)  Mild  Hair  Reviewed  Eyes  Reviewed  Mouth  Reviewed  Skin  Reviewed  Nails  Reviewed       Diet Order:   Diet Order            Diet NPO time specified  Diet effective now              EDUCATION NEEDS:   Not appropriate for education at this time  Skin:  Skin Assessment: Skin Integrity Issues: Skin Integrity Issues:: Incisions Incisions: closed abdomen and chest  Last BM:  Unknown  Height:   Ht Readings from Last 1 Encounters:  10/02/18 5\' 1"  (1.549 m)    Weight:   Wt Readings from Last 1 Encounters:  10/02/18 63.8 kg    Ideal Body Weight:  47.73 kg  BMI:  Body mass index is 26.59 kg/m.  Estimated Nutritional Needs:   Kcal:  1800-2000 kcals (30 kcal/kg +/- 100)  Protein:  95-110g Pro (1.5-1.7g/kg bw)  Fluid:  >1.8 L (30 ml/kg bw)    Payam Gribble A. Jimmye Norman, RD, LDN, [place] Registered Dietitian II Certified Diabetes Care and Education Specialist  Pager: [phone] After hours Pager: [phone]

## 2018-10-05 NOTE — Progress Notes (Signed)
Pt complained of pain in her buttocks, redness left and right buttocks , blanchable, applied skin barrier and pink foam dressing, encourage to repositioned every 2 hours, informed Dr. Barry Dienes and ordered air mattress.

## 2018-10-05 NOTE — Care Management Important Message (Signed)
Important Message  Patient Details  Name: Mariah Kemp MRN: [id number] Date of Birth: [birth year]   Medicare Important Message Given:  Yes - Important Message mailed due to current National Emergency  .[email or web address]    Schawn Byas 10/05/2018, 4:24 PM

## 2018-10-05 NOTE — Progress Notes (Signed)
3 Days Post-Op   Subjective/Chief Complaint: Still no flatus.  Tube feeds tried twice yesterday with significant gas pain.  Main complaint is throat pain.     Objective: Vital signs in last 24 hours: Temp:  [98.2 F (36.8 C)-98.4 F (36.9 C)] 98.4 F (36.9 C) (07/20 0535) Pulse Rate:  [79-88] 79 (07/20 0535) Resp:  [15-18] 18 (07/20 0535) BP: (118-120)/(70-82) 120/74 (07/20 0535) SpO2:  [96 %-98 %] 97 % (07/20 0535)    Intake/Output from previous day: 07/19 0701 - 07/20 0700 In: 1297.8 [I.V.:900; NG/GT:146] Out: 1810 [Urine:1800; Drains:10] Intake/Output this shift: No intake/output data recorded.  Exam: Awake and alert NAD Breathing comfortably. Abdomen soft, non distended, approp tender.  Upper abdominal incision appears to have small hematoma.   J tube in place.  JP with scant serosang drainage.    Lab Results:  Recent Labs    10/04/18 0226 10/05/18 0313  WBC 6.0 5.3  HGB 7.9* 7.8*  HCT 22.4* 22.4*  PLT 106* 110*   BMET Recent Labs    10/04/18 0226 10/05/18 0313  NA 137 135  K 4.0 5.3*  CL 107 109  CO2 23 20*  GLUCOSE 111* 350*  BUN 23* 14  CREATININE 1.16* 0.69  CALCIUM 8.7* 8.4*   PT/INR No results for input(s): LABPROT, INR in the last 72 hours. ABG No results for input(s): PHART, HCO3 in the last 72 hours.  Invalid input(s): PCO2, PO2  Studies/Results: No results found.  Anti-infectives: Anti-infectives (From admission, onward)   Start     Dose/Rate Route Frequency Ordered Stop   10/02/18 2200  ceFAZolin (ANCEF) IVPB 2g/100 mL premix     2 g 200 mL/hr over 30 Minutes Intravenous Every 8 hours 10/02/18 1748 10/02/18 2117   10/02/18 0945  ceFAZolin (ANCEF) IVPB 2g/100 mL premix     2 g 200 mL/hr over 30 Minutes Intravenous On call to O.R. 10/02/18 0934 10/02/18 1237   10/02/18 0937  ceFAZolin (ANCEF) 2-4 GM/100ML-% IVPB    Note to Pharmacy: Lorne Skeens   : cabinet override      10/02/18 0937 10/02/18 1237       Assessment/Plan: s/p Procedure(s): LAPAROSCOPY DIAGNOSTIC (N/A) TOTAL GASTRECTOMY (N/A) FEEDING TUBE PLACEMENT (N/A)  NGT Hold tube feeds until pt having flatus.   Plan upper GI tomorrow.   Await pathology - gastric cancer s/p neoadjuvant chemotherapy.  Pre op clinical stage is T4N1-2M0.  Will see what path stage is. Ambulate. Epidural and PRN pain meds working well.      LOS: 3 days    [name] 10/05/2018

## 2018-10-05 NOTE — Progress Notes (Signed)
Inpatient Diabetes Program Recommendations  AACE/ADA: New Consensus Statement on Inpatient Glycemic Control (2015)  Target Ranges:  Prepandial:   less than 140 mg/dL      Peak postprandial:   less than 180 mg/dL (1-2 hours)      Critically ill patients:  140 - 180 mg/dL   Lab Results  Component Value Date   HGBA1C 5.6 09/24/2016    Review of Glycemic Control  Diabetes history: Hyperglycemia Outpatient Diabetes medications: None Current orders for Inpatient glycemic control: None  Last HgbA1C on 09/24/16 - (2 years ago) 5.6% FBS this am - 350 mg/dL  Inpatient Diabetes Program Recommendations:     Add Novolog 0-9 units Q4H.  Will continue to follow.  Thank you. Lorenda Peck, RD, LDN, CDE Inpatient Diabetes Coordinator [phone]

## 2018-10-05 NOTE — Progress Notes (Signed)
Pt hold the tube feeding since midnight unable to tolerates was given morphine.

## 2018-10-05 NOTE — Addendum Note (Signed)
Addendum  created 10/05/18 1750 by Murvin Natal, MD   Clinical Note Signed

## 2018-10-05 NOTE — Anesthesia Post-op Follow-up Note (Signed)
  Anesthesia Pain Follow-up Note  Patient: Mariah Kemp  Day #: 3  Date of Follow-up: 10/05/2018 Time: 5:49 PM  Last Vitals:  Vitals:   10/05/18 1458 10/05/18 1605  BP: 120/83   Pulse: 81   Resp: 18 18  Temp: 36.8 C   SpO2: 97% 96%    Level of Consciousness: alert  Pain: mild   Side Effects:None  Catheter Site Exam:clean, dry, no drainage  Epidural / Intrathecal (From admission, onward)   Start     Dose/Rate Route Frequency Ordered Stop   10/02/18 1845  ropivacaine (PF) 2 mg/mL (0.2%) (NAROPIN) injection     12 mL/hr 12 mL/hr  Epidural Continuous 10/02/18 1833         Plan: Continue current therapy of postop epidural at surgeon's request  Malaina Mortellaro P [place] B[place]as

## 2018-10-06 ENCOUNTER — Inpatient Hospital Stay (HOSPITAL_COMMUNITY): Payer: 59

## 2018-10-06 LAB — CBC
HCT: 22.5 % — ABNORMAL LOW (ref 36.0–46.0)
Hemoglobin: 7.8 g/dL — ABNORMAL LOW (ref 12.0–15.0)
MCH: 33.3 pg (ref 26.0–34.0)
MCHC: 34.7 g/dL (ref 30.0–36.0)
MCV: 96.2 fL (ref 80.0–100.0)
Platelets: 146 10*3/uL — ABNORMAL LOW (ref 150–400)
RBC: 2.34 MIL/uL — ABNORMAL LOW (ref 3.87–5.11)
RDW: 11.8 % (ref 11.5–15.5)
WBC: 4.5 10*3/uL (ref 4.0–10.5)
nRBC: 0 % (ref 0.0–0.2)

## 2018-10-06 LAB — BASIC METABOLIC PANEL
Anion gap: 6 (ref 5–15)
BUN: 8 mg/dL (ref 6–20)
CO2: 24 mmol/L (ref 22–32)
Calcium: 8.9 mg/dL (ref 8.9–10.3)
Chloride: 108 mmol/L (ref 98–111)
Creatinine, Ser: 0.74 mg/dL (ref 0.44–1.00)
GFR calc Af Amer: >60 mL/min (ref >60)
GFR calc non Af Amer: >60 mL/min (ref >60)
Glucose, Bld: 144 mg/dL — ABNORMAL HIGH (ref 70–99)
Potassium: 4.2 mmol/L (ref 3.5–5.1)
Sodium: 138 mmol/L (ref 135–145)

## 2018-10-06 MED ORDER — IOHEXOL 300 MG/ML  SOLN
150.0000 mL | Freq: Once | INTRAMUSCULAR | Status: AC | PRN
  Administered 2018-10-06: 50 mL via ORAL

## 2018-10-06 NOTE — Plan of Care (Signed)
  Problem: Clinical Measurements: Goal: Ability to maintain clinical measurements within normal limits will improve Outcome: Progressing Goal: Postoperative complications will be avoided or minimized Outcome: Progressing   Problem: Skin Integrity: Goal: Demonstration of wound healing without infection will improve Outcome: Progressing   

## 2018-10-06 NOTE — Progress Notes (Signed)
NGT removed this am, no c/o nausea voiced. MD authorized clear liquids sips for pt,tolerating clears well

## 2018-10-06 NOTE — Progress Notes (Signed)
4 Days Post-Op   Subjective/Chief Complaint: NGT hurts.  Trace flatus.      Objective: Vital signs in last 24 hours: Temp:  [97.7 F (36.5 C)-98.2 F (36.8 C)] 98.2 F (36.8 C) (07/21 0500) Pulse Rate:  [70-81] 70 (07/21 0500) Resp:  [16-18] 16 (07/21 0500) BP: (120-139)/(72-83) 120/72 (07/21 0500) SpO2:  [96 %-100 %] 98 % (07/21 0500)    Intake/Output from previous day: 07/20 0701 - 07/21 0700 In: 50 [IV Piggyback:50] Out: 1900 [Urine:1750; Emesis/NG output:150] Intake/Output this shift: No intake/output data recorded.  Exam: Awake and alert NAD Breathing comfortably. Abdomen soft, non distended, approp tender.  Upper abdominal incision appears to have small hematoma.   J tube in place.  JP with scant serosang drainage.    Lab Results:  Recent Labs    10/05/18 0313 10/06/18 0528  WBC 5.3 4.5  HGB 7.8* 7.8*  HCT 22.4* 22.5*  PLT 110* 146*   BMET Recent Labs    10/05/18 0313 10/06/18 0528  NA 135 138  K 5.3* 4.2  CL 109 108  CO2 20* 24  GLUCOSE 350* 144*  BUN 14 8  CREATININE 0.69 0.74  CALCIUM 8.4* 8.9   PT/INR No results for input(s): LABPROT, INR in the last 72 hours. ABG No results for input(s): PHART, HCO3 in the last 72 hours.  Invalid input(s): PCO2, PO2  Studies/Results: No results found.  Anti-infectives: Anti-infectives (From admission, onward)   Start     Dose/Rate Route Frequency Ordered Stop   10/02/18 2200  ceFAZolin (ANCEF) IVPB 2g/100 mL premix     2 g 200 mL/hr over 30 Minutes Intravenous Every 8 hours 10/02/18 1748 10/02/18 2117   10/02/18 0945  ceFAZolin (ANCEF) IVPB 2g/100 mL premix     2 g 200 mL/hr over 30 Minutes Intravenous On call to O.R. 10/02/18 0934 10/02/18 1237   10/02/18 0937  ceFAZolin (ANCEF) 2-4 GM/100ML-% IVPB    Note to Pharmacy: Lorne Skeens   : cabinet override      10/02/18 0937 10/02/18 1237      Assessment/Plan: s/p Procedure(s): LAPAROSCOPY DIAGNOSTIC (N/A) TOTAL GASTRECTOMY (N/A) FEEDING  TUBE PLACEMENT (N/A)  NGT Hold tube feeds until pt having flatus.  W   Plan upper GI today Await pathology - gastric cancer s/p neoadjuvant chemotherapy.  Pre op clinical stage is T4N1-2M0.  Will see what path stage is. Ambulate. Epidural and PRN pain meds working well.  Hope to d/c epidural tomorrow.      LOS: 4 days    [name] 10/06/2018

## 2018-10-06 NOTE — Consult Note (Addendum)
WOC consult requested for reddened buttocks.  Our team is working remotely today; I will assess the location in person tomorrow.   Reviewed progress notes in the EMR.  Bedside nurse notes: "Pt complained of pain in her buttocks, redness left and right buttocks , blanchable, applied skin barrier and pink foam dressing, encourage to repositioned every 2 hours, informed Dr. Barry Dienes and ordered air mattress."  Orders placed for nurses to apply barrier cream with each turning and cleaning episode in addition to the air mattress, which has already been ordered for pressure reduction. Julien Girt MSN, RN, [place], [place], [place]

## 2018-10-06 NOTE — Anesthesia Post-op Follow-up Note (Signed)
  Anesthesia Pain Follow-up Note  Patient: Mariah Kemp  Day #: 4  Date of Follow-up: 10/06/2018 Time: 4:14 PM  Last Vitals:  Vitals:   10/06/18 0500 10/06/18 1503  BP: 120/72 127/75  Pulse: 70 69  Resp: 16 15  Temp: 36.8 C 36.8 C  SpO2: 98% 100%    Level of Consciousness: alert  Pain: none   Side Effects:None  Catheter Site Exam:clean, dry, small amount of dried blood near insertion site.  Epidural / Intrathecal (From admission, onward)   Start     Dose/Rate Route Frequency Ordered Stop   10/02/18 1845  ropivacaine (PF) 2 mg/mL (0.2%) (NAROPIN) injection     12 mL/hr 12 mL/hr  Epidural Continuous 10/02/18 1833         Plan: Continue current therapy of postop epidural at surgeon's request  [place] S

## 2018-10-07 LAB — CBC
HCT: 22.9 % — ABNORMAL LOW (ref 36.0–46.0)
Hemoglobin: 8.2 g/dL — ABNORMAL LOW (ref 12.0–15.0)
MCH: 34.2 pg — ABNORMAL HIGH (ref 26.0–34.0)
MCHC: 35.8 g/dL (ref 30.0–36.0)
MCV: 95.4 fL (ref 80.0–100.0)
Platelets: 152 10*3/uL (ref 150–400)
RBC: 2.4 MIL/uL — ABNORMAL LOW (ref 3.87–5.11)
RDW: 11.5 % (ref 11.5–15.5)
WBC: 4.7 10*3/uL (ref 4.0–10.5)
nRBC: 0 % (ref 0.0–0.2)

## 2018-10-07 LAB — BASIC METABOLIC PANEL
Anion gap: 8 (ref 5–15)
BUN: 6 mg/dL (ref 6–20)
CO2: 23 mmol/L (ref 22–32)
Calcium: 8.8 mg/dL — ABNORMAL LOW (ref 8.9–10.3)
Chloride: 107 mmol/L (ref 98–111)
Creatinine, Ser: 0.65 mg/dL (ref 0.44–1.00)
GFR calc Af Amer: >60 mL/min (ref >60)
GFR calc non Af Amer: >60 mL/min (ref >60)
Glucose, Bld: 107 mg/dL — ABNORMAL HIGH (ref 70–99)
Potassium: 3.6 mmol/L (ref 3.5–5.1)
Sodium: 138 mmol/L (ref 135–145)

## 2018-10-07 LAB — PROTIME-INR
INR: 1.1 (ref 0.8–1.2)
Prothrombin Time: 14.4 seconds (ref 11.4–15.2)

## 2018-10-07 MED ORDER — ALUM & MAG HYDROXIDE-SIMETH 200-200-20 MG/5ML PO SUSP: 30.0000 mL | Freq: Four times a day (QID) | ORAL | Status: DC | PRN

## 2018-10-07 MED ORDER — ENOXAPARIN SODIUM 40 MG/0.4ML ~~LOC~~ SOLN
40.0000 mg | SUBCUTANEOUS | Status: DC
  Administered 2018-10-07 – 2018-10-10 (×3): 40 mg via SUBCUTANEOUS
  Filled 2018-10-07 (×4): qty 0.4

## 2018-10-07 MED ORDER — CYANOCOBALAMIN 1000 MCG/ML IJ SOLN
1000.0000 ug | Freq: Once | INTRAMUSCULAR | Status: DC
  Filled 2018-10-07 (×2): qty 1

## 2018-10-07 MED ORDER — OXYCODONE HCL 5 MG/5ML PO SOLN
5.0000 mg | ORAL | Status: DC | PRN
  Administered 2018-10-09 – 2018-10-10 (×4): 10 mg via ORAL
  Filled 2018-10-07 (×4): qty 10

## 2018-10-07 MED ORDER — PRO-STAT SUGAR FREE PO LIQD
30.0000 mL | Freq: Every day | ORAL | Status: DC
  Administered 2018-10-07 – 2018-10-10 (×4): 30 mL
  Filled 2018-10-07 (×4): qty 30

## 2018-10-07 MED ORDER — SODIUM CHLORIDE 0.9% FLUSH: 10.0000 mL | INTRAVENOUS | Status: DC | PRN

## 2018-10-07 MED ORDER — OSMOLITE 1.5 CAL PO LIQD
1000.0000 mL | ORAL | Status: DC
  Administered 2018-10-07: 1000 mL
  Filled 2018-10-07: qty 1000

## 2018-10-07 MED ORDER — OSMOLITE 1.2 CAL PO LIQD
1000.0000 mL | ORAL | Status: DC
  Administered 2018-10-07: 17:00:00 1000 mL
  Filled 2018-10-07: qty 1000

## 2018-10-07 MED ORDER — CALCIUM CARBONATE ANTACID 500 MG PO CHEW
1.0000 | CHEWABLE_TABLET | Freq: Two times a day (BID) | ORAL | Status: DC
  Administered 2018-10-07 – 2018-10-11 (×9): 200 mg via ORAL
  Filled 2018-10-07 (×9): qty 1

## 2018-10-07 MED ORDER — MORPHINE SULFATE (PF) 2 MG/ML IV SOLN
1.0000 mg | INTRAVENOUS | Status: DC | PRN
  Administered 2018-10-07 (×2): 2 mg via INTRAVENOUS
  Filled 2018-10-07 (×2): qty 1

## 2018-10-07 NOTE — Anesthesia Post-op Follow-up Note (Signed)
  Anesthesia Pain Follow-up Note  Patient: Mariah Kemp  Day #: 5  Date of Follow-up: 10/07/2018 Time: 9:54 AM  Last Vitals:  Vitals:   10/06/18 2130 10/07/18 0528  BP: 128/76 120/71  Pulse: 75 65  Resp: 18 16  Temp: 36.8 C 36.9 C  SpO2: 97% 97%    Level of Consciousness: alert  Pain: mild   Side Effects:None  Catheter Site Exam:clean, dry, no drainage  Anti-Coag Meds (From admission, onward)   Start     Dose/Rate Route Frequency Ordered Stop   10/07/18 1600  enoxaparin (LOVENOX) injection 40 mg    Note to Pharmacy: Do not give until 2 hours post epidural d/c.    Hold for Hgb < 8 or SBP < 90 given concerns of active bleeding   40 mg Subcutaneous Every 24 hours 10/07/18 0902      Epidural / Intrathecal (From admission, onward)   Start     Dose/Rate Route Frequency Ordered Stop   10/02/18 1845  ropivacaine (PF) 2 mg/mL (0.2%) (NAROPIN) injection     12 mL/hr 12 mL/hr  Epidural Continuous 10/02/18 1833         Plan: No anticoagulants given per [facility] and RN. Platelets and INR acceptable. Catheter removed/tip intact at surgeon's request, D/C Infusion at surgeon's request and D/C from anesthesia care at surgeon's request. May start anticoagulant >2 hours after epidural is removed. Byerly ordered lovenox for 1600. Epidural came out at 1400. I discussed with RN at bedside and pharmacist on the floor that it should be 1800 just to be safe.   Nolon Nations

## 2018-10-07 NOTE — Progress Notes (Signed)
5 Days Post-Op   Subjective/Chief Complaint: Upper gi yesterday negative for leak.  Ngt removed and started on sips.  Havin flatus and 1 stool.  No overnight events.  She had a clot in her drain, and once this was stripped, she started having some output again.    Objective: Vital signs in last 24 hours: Temp:  [98.2 F (36.8 C)-98.5 F (36.9 C)] 98.5 F (36.9 C) (07/22 0528) Pulse Rate:  [65-75] 65 (07/22 0528) Resp:  [15-18] 16 (07/22 0528) BP: (120-128)/(71-76) 120/71 (07/22 0528) SpO2:  [97 %-100 %] 97 % (07/22 0528)    Intake/Output from previous day: 07/21 0701 - 07/22 0700 In: 853.5 [I.V.:803.5; IV Piggyback:50] Out: 2500 [Urine:2350; Drains:150] Intake/Output this shift: No intake/output data recorded.  Exam: Awake and alert NAD Breathing comfortably. Abdomen soft, non distended, J tube in place.  JP with serosang drainage.    Lab Results:  Recent Labs    10/06/18 0528 10/07/18 0251  WBC 4.5 4.7  HGB 7.8* 8.2*  HCT 22.5* 22.9*  PLT 146* 152   BMET Recent Labs    10/06/18 0528 10/07/18 0251  NA 138 138  K 4.2 3.6  CL 108 107  CO2 24 23  GLUCOSE 144* 107*  BUN 8 6  CREATININE 0.74 0.65  CALCIUM 8.9 8.8*   PT/INR No results for input(s): LABPROT, INR in the last 72 hours. ABG No results for input(s): PHART, HCO3 in the last 72 hours.  Invalid input(s): PCO2, PO2  Studies/Results: Dg Ugi W Single Cm (sol Or Thin Ba)  Result Date: 10/06/2018 CLINICAL DATA:  Gastric cancer, status post gastrectomy. EXAM: WATER SOLUBLE UPPER GI SERIES TECHNIQUE: Single-column upper GI series was performed using water soluble contrast. CONTRAST:  65mL OMNIPAQUE IOHEXOL 300 MG/ML  SOLN COMPARISON:  CT 09/20/2018 FLUOROSCOPY TIME:  Fluoroscopy Time:  3 minutes 12 seconds Radiation Exposure Index (if provided by the fluoroscopic device): Number of Acquired Spot Images: 0 FINDINGS: Scout film of the abdomen demonstrates NG tube, jejunostomy tube, and surgical drains in  place. Nonobstructive bowel gas pattern. Fluoroscopic evaluation of swallowing demonstrates patency of esophagus and esophagojejunostomy. No evidence of leak. Contrast fills the afferent and efferent limbs. Contrast passes freely downstream. No evidence of leak or obstruction downstream within the small bowel. IMPRESSION: No evidence of leak or obstruction. Electronically Signed   By: Rolm Baptise M.D.   On: 10/06/2018 09:19    Anti-infectives: Anti-infectives (From admission, onward)   Start     Dose/Rate Route Frequency Ordered Stop   10/02/18 2200  ceFAZolin (ANCEF) IVPB 2g/100 mL premix     2 g 200 mL/hr over 30 Minutes Intravenous Every 8 hours 10/02/18 1748 10/02/18 2117   10/02/18 0945  ceFAZolin (ANCEF) IVPB 2g/100 mL premix     2 g 200 mL/hr over 30 Minutes Intravenous On call to O.R. 10/02/18 0934 10/02/18 1237   10/02/18 0937  ceFAZolin (ANCEF) 2-4 GM/100ML-% IVPB    Note to Pharmacy: Lorne Skeens   : cabinet override      10/02/18 0937 10/02/18 1237      Assessment/Plan: s/p Procedure(s): LAPAROSCOPY DIAGNOSTIC (N/A) TOTAL GASTRECTOMY (N/A) FEEDING TUBE PLACEMENT (N/A)   Restart tube feeds and advance to goal.  If poorly tolerating, would switch to semi elemental feeds.    Will need vitamins B12, calcium and MVI since post gastrectomy.  We do not have sublingual here, so will give an injection prior to d/c.  Check baseline b12 level in AM.  D/c epidural today and start oxycodone elixir.  Still has PRN morphine and toradol. Advance to clears.  Goal would be fulls tomorrow and soft foods the next day if tolerates.  But VERY SOFT textured foods only and plan to d/c on this regimen.    D/c foley at least 4 hours post epidural removal. Start lovenox 2 hours post epidural removal.    Await pathology - gastric cancer s/p neoadjuvant chemotherapy.  Pre op clinical stage is T4N1-2M0.  Path stage ypT0N0, so pathologic complete response.  Discussed at multidisciplinary tumor  board.  Will not need additional tx.    Ambulate.    Will plan to start transitioning other meds to PO tomorrow on gradual basis.  Do not want to overwhelm at once.    Dispo plan will be home with tube feeds.  Barrier to d/c is getting her up to full feeds. She will need feeds for a while until she can get adequate calories by mouth.     LOS: 5 days    Mariah Kemp 10/07/2018

## 2018-10-07 NOTE — Progress Notes (Addendum)
Nutrition Follow-up  DOCUMENTATION CODES:   Not applicable  INTERVENTION:   -RD will follow for diet advancement and supplement as appopriate -D/c Osmolite 1.5 -Initiate Osmolite 1.2 @ 20 ml/hr via j-tube and increase by 10 ml every 8 hours to goal rate of 60 ml/hr.   30 ml Prostat daily.    When no IVFS, recommend 150 ml free water flush every 6 hours  Tube feeding regimen provides 1828 kcal (100% of needs), 95 grams of protein, and 1181 ml of H2O (total free water 1781 with inclusion of free water flush regimen).   -Provided education on postgastrectomy diet. Provided "Gastric Surgery Nutrition Therapy" handout from [facility] Nutrition Care Manual  NUTRITION DIAGNOSIS:   Inadequate oral intake related to inability to eat as evidenced by NPO status.  Ongoing  GOAL:   Patient will meet greater than or equal to 90% of their needs  Progressing   MONITOR:   Diet advancement, Labs, Weight trends, TF tolerance, Skin, I & O's  REASON FOR ASSESSMENT:   Consult Assessment of nutrition requirement/status, Enteral/tube feeding initiation and management  ASSESSMENT:   58 y/o female PMHx HTN and gastric cancer. Pt diagnosed this past January. Treated w/ neoadjuvant chemotherapy 1/29-5/6. Underwent total gastrectomy with roux-en-y esophagojejunostomy and placement of feeding jejunostomy 7/17.  7/17- s/p total gastrectomy with roux-en-y esophagojejunostomy and placement of feeding jejunostomy 7/17 7/19- TF started, TF stopped due to poor tolerance  7/21- NGT removed, UGI negative for leak  Reviewed I/O's: -1.6 L x 24 hours and -1.7 L since admission  UOP: 2.4 L x 24 hours  Drain output: 150 ml x 24 hours  Case discussed with RN, who reports TF was just restarted and is tolerating well.   Also spoke with [facility] regarding home TF recommendations. Per Dr. Barry Dienes, pt will d/c home on continuous home TF initially and then transition to cyclic feeding as tolerated.   Spoke with pt  at bedside, who was very pleasant and in good spirits today. Pt reports a small amount of abdominal pain, but nothing in comparison to what she felt earlier this week. Pt with multiple questions regarding diet/nutrition regimen at discharge. Reviewed with pt Dr. Marlowe Aschoff recommendations for home continuous TF as well as slow transition to soft, easy to digest foods. Reviewed post-gastrectomy diet guidelines with pt (low fiber, small, frequent meals, and diet low in concentrated sweets). She is currently on a clear liquid diet and tolerating well (consumed 50% of chicken broth this AM).   Osmolite 1.5 currently infusing at 20 ml/hr, which provides 720 kcals, 30 grams protein, and 366 ml free water, meeting 40% of estimated kcal needs and 32% of estimated protein needs.   Per general surgery notes, will provide sublingual vitamin injections at d/c.   Labs reviewed.   Diet Order:   Diet Order            Diet clear liquid Room service appropriate? Yes; Fluid consistency: Thin  Diet effective now              EDUCATION NEEDS:   Not appropriate for education at this time  Skin:  Skin Assessment: Skin Integrity Issues: Skin Integrity Issues:: Incisions Incisions: closed abdomen and chest  Last BM:  Unknown  Height:   Ht Readings from Last 1 Encounters:  10/02/18 5\' 1"  (1.549 m)    Weight:   Wt Readings from Last 1 Encounters:  10/02/18 63.8 kg    Ideal Body Weight:  47.73 kg  BMI:  Body  mass index is 26.59 kg/m.  Estimated Nutritional Needs:   Kcal:  1800-2000 kcals (30 kcal/kg +/- 100)  Protein:  95-110g Pro (1.5-1.7g/kg bw)  Fluid:  >1.8 L (30 ml/kg bw)    Jenalyn Girdner A. Jimmye Norman, RD, LDN, [place] Registered Dietitian II Certified Diabetes Care and Education Specialist Pager: [phone] After hours Pager: [phone]

## 2018-10-07 NOTE — Consult Note (Signed)
WOC follow-up: Refer to consult note from 7/21.  Assessed bilat buttocks.  Pt states "it feels much better since they put me on the air mattress."  Bilat buttocks with nonblanchable erythremia areas, approx 3X3cm, no open wounds.  Continue present plan of care with air mattress to reduce pressure. Please re-consult if further assistance is needed.  Thank-you,  Julien Girt MSN, [place], [place], [place], [place]

## 2018-10-07 NOTE — Discharge Instructions (Addendum)
Gastric surgery Nutrition Therapy  Your surgery has changed how your stomach and intestines work. Your dietitian will help you understand what foods and drinks are best for you. The amount and types of foods you eat may cause you to experience nausea, diarrhea, or other symptoms. Do not eat foods that have a lot of sugar in them. Have drinks between your meals (not with your meals) and eat the recommended foods. After you recover from your surgery, some foods (in small amounts) may be slowly added to what you eat each day. Foods Recommended While you recover, you should: Eat very small meals and snacks. Avoid foods that have lots of sugar. Have drinks between meals (not with meals or snacks). Avoid the foods listed on the Foods Not Recommended chart in this handout (A registered dietitian can help you choose foods that are best for you). Have a protein food (such as meat, cheese, or eggs) at every meal. Choose soft and well-cooked foods. Choose grain foods made with white or refined fiber. Choices should have less than 2 grams (g) fiber per serving. After surgery, you will start eating solid food by trying one or two foods per meal. One of these foods should be a protein food. During the recovery period, you can slowly add more foods (in small amounts) to your daily eating plan. In time, you will be eating between six and eight small meals and snacks each day. While you recover, you should not drink beverages with meals. Instead, you must wait 30 to 60 minutes after you eat solid food before you have a beverage. The following chart lists the best food choices for the recovery period of 6 to 8 weeks after your surgery. Food Group Foods Recommended Notes Milk and Milk Products Buttermilk Evaporated , skim, and 1% fat milk Soy milk with no added sugar Yogurt with no added sugar Powdered milk Cheese Low-fat , low-sugar ice cream Choose lactose-free products if you have lactose  intolerance after surgery. (If you have this condition, you will have symptoms after drinking regular milk or eating foods made from milk. Symptoms include diarrhea, nausea, stomach pain, and bloating.) If you eat yogurt, choose ones that include live, active cultures. (The food label will list this information.) Do not drink milk or o ther beverages with meals or snacks. After eating solid foods, wait 30 to 60 minutes before having a beverage. Copyright Academy of Nutrition and Dietetics. This handout may be duplicated for client education. Page 2/5 Meat and Other Protein Foods Tender, well-cooked meats, poultry, fish, eggs, or soy foods prepared without added fat Smooth n ut butters Make sure to include a protein food in every meal and snack. Grains White flour Bread, bage ls, rolls, crackers, and pasta made from white or refined flour Cold or hot cereals made from white or refined flour Choose grain foods with less than 2 g fiber per serving. (The grams o f dietary fiber in one serving are listed on the Nutrition Facts label of packaged foods.) Choose cereals that have no added suga r. Vegetables Most well-cooked vegetables without seeds or skins Potatoes without skin Lettuce Strained vegetable juice See the Foods Not Recommended chart for specific vegetables to avoid. Fruits Canned, soft fruits without added sugar Bananas, me lon Fats Oils, butter, margarine Cream, cream cheese Mayonnaise Beverages Decaffeinated coffee Caffeine-free tea Sugar-free soft d rinks without caffeine After eating solid foods, wait 30 to 60 minutes before having a beverage. Do not have beverages with meals. Sweeten  coffee or tea with a rtificial sweeteners only. Other Any allowed foods made with artificial sweeteners Allowed artificial sweeteners include saccharin (Sweet N Low), aspartame (Equal, NutraSweet), sucralose (Splenda), and acesulfame potassium ([place], SweetOne). Foods Not  Recommended Food Group Foods to Limit or Avoid Milk and Milk Products Chocolate milk Other milk food s made with added sugar If you have lactose intolerance, avoid reg ular milk and foods made with regular milk. Choose lactose-free products or soy milk instead. Do not drink milk or other beverages with meals or snacks. After eating solid foods, wait 30 to 60 minutes before having a beverage. Meat and Other Protein Foods Fried meat, Sales executive, or fish [employer], such as b Financial planner, hot dogs, bacon Tough or chewy meats Dried beans and peas, such as pinto or kidney beans Nuts, chunky nut butters Copyright Academy of Nutrition and Dietetics. This handout may be duplicated for client education. Page 3/5 Grains Whole grain flour Breads, bagels, ro lls, crackers, and pasta with more than 2 grams of fiber per serving or made from whole grain flour Cold or hot cereals wit h more than 2 grams of fiber per serving or made from whole grains Cereals with added sugar Vegetables All raw vegetables except lettuce Any cooked vegetables served wi th skins or seeds Beets Brocc oli, brussels sprouts, cabbage Cauliflower Collards, m ustard, and turnip greens Corn Potat o skins Fruits All raw fruits except banana and melons Dried fruits including prunes and raisins Fruit juice Canned fr uit in sugar or syrup Beverages Caffeinated coffee or tea Alcoholic beverages Beverages made wit h sugar, corn syrup, or honey Fruit juices and fruit drinks Do not drink beverages wit h meals or snacks. After eating solid foods, wait 30 to 60 minutes before having a beverage. Other Sugar Honey , syrup Sorbitol, xylito l Foods that list sugar, honey, syrup, xylitol, or sorbitol as one of the first three ingredients on the food label Copyright Academy of Nutrition and Dietetics. This handout may be duplicated for client education. Page 4/5 Gastric Surgery Sample 1-Day Menu Breakfast 1  scrambled egg 1 slice white toast 2 teaspoons margarine 1 cup decaf coffee (30-60 minutes after breakfast) Morning Snack 2 oz cheddar cheese 6 saltine crackers 1/2 cup canned peaches, no added sugar 1 cup soy milk, no added sugar (30-60 min after) Lunch 1/2 cup tuna salad 6 saltine crackers 12 oz sugar-free soda (30-60 minutes after lunch) 1 slice white bread Afternoon Snack 1 cup yogurt without added sugar Evening Meal 5 oz roast beef 1 cup mashed potatoes 1 cup green beans 1 cup caffeine-free tea (30-60 minutes after meal) Evening Snack 1/2 plain bagel 2 tablespoons cream cheese 1 cup soy milk, no added sugar (30-60 min after) Gastric Surgery Vegetarian (Lacto-Ovo) Sample 1-Day Menu Breakfast 1 scrambled egg 1 slice white toast 2 teaspoons margarine, soft, tub 1 cup decaffeinated coffee (30-60 min after breakfast) Morning Snack 6 saltine crackers 2 ounces cheddar cheese  cup canned peaches, in juice 1 cup 1% milk (30-60 minutes after snack) Lunch 2 slices white bread 2 tablespoons smooth peanut butter 1 cup cantaloupe 1 cup green tea (30-60 minutes after lunch) Afternoon Snack 1 cup plain yogurt Evening Meal  cup meatless chicken 1 cup mashed potatoes 1 cup cooked green beans 2 teaspoons olive oil 1 cup decaffeinated tea (30-60 minutes after dinner) Evening Snack  ounce pretzels 1 cup 1% milk (30-60 minutes after snack) Copyright Academy of Nutrition and Dietetics. This handout may be  duplicated for client education. Page 5/5 Gastric Surgery Vegan Sample 1-Day Menu Breakfast 1/3 cup tofu scramble 1 slice white toast 2 teaspoons margarine, soft, tub 1 cup decaffeinated coffee (30-60 min after breakfast) Morning Snack 6 saltine crackers 1 tablespoon smooth almond butter  cup canned peaches, in juice  cup unsweetened soymilk fortified with calcium, vitamin B12, and vitamin D (30-60 minutes after snack) Lunch 2 slices white bread 2 tablespoons smooth  peanut butter 1 small banana 1 cup green tea (30-60 minutes after lunch) Afternoon Snack 6 ounces plain soy yogurt Evening Meal  cup meatless chicken 1 cup mashed potatoes 1 cup cooked green beans 2 teaspoons olive oil 1 cup decaffeinated tea (30-60 minutes after dinner) Evening Snack  ounce pretzels 1 cup unsweetened soymilk fortified with calcium, vitamin B12, and vitamin D (30-60 minutes after snack)  [facility], [place] [phone]  ABDOMINAL SURGERY: POST OP INSTRUCTIONS  Always review your discharge instruction sheet given to you by the facility where your surgery was performed.  IF YOU HAVE DISABILITY OR FAMILY LEAVE FORMS, YOU MUST BRING THEM TO THE OFFICE FOR PROCESSING.  PLEASE DO NOT GIVE THEM TO YOUR DOCTOR.  1. A prescription for pain medication may be given to you upon discharge.  Take your pain medication as prescribed, if needed.  If narcotic pain medicine is not needed, then you may take acetaminophen (Tylenol) or ibuprofen (Advil) as needed. 2. Take your usually prescribed medications unless otherwise directed. 3. If you need a refill on your pain medication, please contact your pharmacy. They will contact our office to request authorization.  Prescriptions will not be filled after 5pm or on week-ends. 4. You should follow a light diet the first few days after arrival home, such as soup and crackers, pudding, etc.unless your doctor has advised otherwise. A high-fiber, low fat diet can be resumed as tolerated.   Be sure to include lots of fluids daily. Most patients will experience some swelling and bruising on the chest and neck area.  Ice packs will help.  Swelling and bruising can take several days to resolve 5. Most patients will experience some swelling and bruising in the area of the incision. Ice pack will help. Swelling and bruising can take several days to resolve..  6. It is common to experience some constipation if taking pain medication  after surgery.  Increasing fluid intake and taking a stool softener will usually help or prevent this problem from occurring.  A mild laxative (Milk of Magnesia or Miralax) should be taken according to package directions if there are no bowel movements after 48 hours. 7.  You may have steri-strips (small skin tapes) in place directly over the incision.  These strips should be left on the skin for 10-14 days.  If your surgeon used skin glue on the incision, you may shower in 48 hours.  The glue will flake off over the next 2-3 weeks.  Any sutures or staples will be removed at the office during your follow-up visit. You may find that a light gauze bandage over your incision may keep your staples from being rubbed or pulled. You may shower and replace the bandage daily. 8. ACTIVITIES:  You may resume regular (light) daily activities beginning the next day--such as daily self-care, walking, climbing stairs--gradually increasing activities as tolerated.  You may have sexual intercourse when it is comfortable.  Refrain from any heavy lifting or straining until approved by your doctor. a. You may drive  when you no longer are taking prescription pain medication, you can comfortably wear a seatbelt, and you can safely maneuver your car and apply brakes b. Return to Work: __________8 weeks if applicable_________________________ 42. You should see your doctor in the office for a follow-up appointment approximately two weeks after your surgery.  Make sure that you call for this appointment within a day or two after you arrive home to insure a convenient appointment time. OTHER INSTRUCTIONS:  _____________________________________________________________ _____________________________________________________________  WHEN TO CALL YOUR DOCTOR: 1. Fever over 101.0 2. Inability to urinate 3. Nausea and/or vomiting 4. Extreme swelling or bruising 5. Continued bleeding from incision. 6. Increased pain, redness, or  drainage from the incision. 7. Difficulty swallowing or breathing 8. Muscle cramping or spasms. 9. Numbness or tingling in hands or feet or around lips.  The clinic staff is available to answer your questions during regular business hours.  Please dont hesitate to call and ask to speak to one of the nurses if you have concerns.  For further questions, please visit [email or web address]

## 2018-10-07 NOTE — TOC Initial Note (Addendum)
Transition of Care [facility]) - Initial/Assessment Note    Patient Details  Name: Mariah Kemp MRN: [id number] Date of Birth: [birth year]  Transition of [facility] CM/SW Contact:    Marilu Favre, RN Phone Number: 10/07/2018, 12:22 PM  Clinical Narrative:                 Spoke to patient at bedside. Address in Epic is her mailing address. Patient and her husband have a RV they are staying in ( they had planned to retired and travel). RV is at [address] , [place] , [place] [address]   Patient's cell is [phone].   Explained NCM spoke to Dr Barry Dienes this morning and plan is she will discharge on continuous tube feeds and eventually be converted to cyclic tube feeds after  Discharge.  Explained bedside nurses here at hospital will teach her and husband how to administer tube feeds, flushes, medications etc prior to discharge. She will have a home health nurse however [facility] will not be able to make daily visits. Bedside nurse will also teach any wound care and drain care to patient and her husband.   Patient voiced understanding.   Zack with [facility] following for tube feeds. Once it is determined which formula and rate will be needed at discharge, will need orders.   Cory with [facility] checking to see if he can accept referral for [facility].   Tommi Rumps has accepted referral for [facility]  Expected Discharge Plan: [place] Barriers to Discharge: Continued Medical Work up   Patient Goals and CMS Choice Patient states their goals for this hospitalization and ongoing recovery are:: to go home CMS Medicare.gov Compare Post Acute Care list provided to:: Patient Choice offered to / list presented to : Patient  Expected Discharge Plan and Services Expected Discharge Plan: [place]   Discharge Planning Services: CM Consult Post Acute Care Choice: [place] arrangements for the past 2 months: Single Family Home Expected Discharge Date: 10/12/18                DME Arranged: Tube feeding pump, Tube feeding(will need orders) DME Agency: [employer] Date DME Agency Contacted: 10/07/18 Time DME Agency Contacted: 1217 Representative spoke with at DME Agency: [place]: RN [place] Agency: [place] Date [place]: 10/07/18 Time [place]: 1221 Representative spoke with at [place]  Prior Living Arrangements/Services Living arrangements for the past 2 months: [place] Lives with:: Spouse Patient language and need for interpreter reviewed:: Yes Do you feel safe going back to the place where you live?: Yes      Need for Family Participation in Patient Care: Yes (Comment) Care giver support system in place?: Yes (comment)   Criminal Activity/Legal Involvement Pertinent to Current Situation/Hospitalization: No - Comment as needed  Activities of Daily Living      Permission Sought/Granted   Permission granted to share information with : Yes, Verbal Permission Granted     Permission granted to share info w AGENCY: [place], [place]        Emotional Assessment Appearance:: Appears stated age Attitude/Demeanor/Rapport: Engaged Affect (typically observed): Accepting Orientation: : Oriented to Self, Oriented to Place, Oriented to  Time, Oriented to Situation Alcohol / Substance Use: Not Applicable Psych Involvement: No (comment)  Admission diagnosis:  S/P gastrectomy [Z90.3] Patient Active Problem List   Diagnosis Date Noted  . S/P gastrectomy 10/02/2018  . 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 Port-A-Cath in place 05/13/2018  . Iron deficiency anemia due to chronic blood loss 04/03/2018  . Gastric cancer ([place]) 04/02/2018  . Acute upper GI bleed 03/31/2018  . Acute gastric ulcer without hemorrhage, perforation or obstruction 03/31/2018  . Symptomatic anemia 03/31/2018  . B12 deficiency 03/31/2018  . Gastrointestinal hemorrhage   . Palpitations 03/27/2018   . Moderate intermittent asthma without complication 14/97/0263  . Mixed hyperlipidemia 09/24/2016  . Elevated fasting glucose 09/24/2016  . Cold sore 04/02/2016  . Nasal polyposis 09/07/2014  . Hypertriglyceridemia 08/24/2014  . Hypothyroidism 08/24/2014  . History of breast cancer 08/19/2014  . Chronic bronchitis ([place]) 05/31/2014  . Essential hypertension 05/31/2014   PCP:  Emeterio Reeve, DO Pharmacy:   [id number] - [place], [address] [place] [address] Phone: [phone] Fax: [phone]     Social Determinants of Health (SDOH) Interventions    Readmission Risk Interventions No flowsheet data found.

## 2018-10-08 ENCOUNTER — Telehealth: Payer: Self-pay | Admitting: Hematology

## 2018-10-08 LAB — VITAMIN B12: Vitamin B-12: 254 pg/mL (ref 180–914)

## 2018-10-08 MED ORDER — OSMOLITE 1.2 CAL PO LIQD
1000.0000 mL | ORAL | Status: DC
  Administered 2018-10-08: 1000 mL
  Filled 2018-10-08: qty 1000

## 2018-10-08 MED ORDER — LOPERAMIDE HCL 2 MG PO CAPS
2.0000 mg | ORAL_CAPSULE | Freq: Two times a day (BID) | ORAL | Status: DC | PRN
  Administered 2018-10-08: 2 mg via ORAL
  Filled 2018-10-08: qty 1

## 2018-10-08 NOTE — Progress Notes (Signed)
Nutrition Follow-up  RD working remotely.  DOCUMENTATION CODES:   Not applicable  INTERVENTION:   -RD will follow for diet advancement and supplement as appropriate -Continue Osmolite 1.2 @ 40 ml/hr via j-tube and increase by 10 ml every 8 hours to goal rate of 60 ml/hr.   30 ml Prostat daily.    When no IVFS, recommend 150 ml free water flush every 6 hours  Tube feeding regimen provides 1828 kcal (100% of needs), 95 grams of protein, and 1181 ml of H2O (total free water 1781 with inclusion of free water flush regimen).   NUTRITION DIAGNOSIS:   Inadequate oral intake related to inability to eat as evidenced by NPO status.  Ongoing  GOAL:   Patient will meet greater than or equal to 90% of their needs  Progressing   MONITOR:   Diet advancement, Labs, Weight trends, TF tolerance, Skin, I & O's  REASON FOR ASSESSMENT:   Consult Assessment of nutrition requirement/status, Enteral/tube feeding initiation and management  ASSESSMENT:   58 y/o female PMHx HTN and gastric cancer. Pt diagnosed this past January. Treated w/ neoadjuvant chemotherapy 1/29-5/6. Underwent total gastrectomy with roux-en-y esophagojejunostomy and placement of feeding jejunostomy 7/17.  7/17- s/ptotal gastrectomy with roux-en-y esophagojejunostomy and placement of feeding jejunostomy 7/17 7/19- TF started, TF stopped due to poor tolerance  7/21- NGT removed, UGI negative for leak 7/22- advanced to clear liquid diet, TF resumed @ 20 ml/hr 7/23- advanced to full liquid diet, TF advanced to 40 ml/hr  Reviewed I/O's: -342 ml x 24 hours and -2 L since admission  UOP: 1.8 L x 24 hours  Drain output: 355 ml x 24 hours  Pt advanced to full liquid diet today; consumed 20% of breakfast this AM.   TF advanced this morning. Pt receiving Osmolite 1.2 @ 40 ml/hr via j-tube with 30 ml Prostat daily. Currently providing 1252 kcals, 68 grams protein, and 787 ml free water daily, meeting 70% of estimated  kcal needs and 72% of estimated protein needs.   Per general surgery notes, will provide sublingual vitamin injections at d/c.   Labs reviewed.   Diet Order:   Diet Order            Diet full liquid Room service appropriate? Yes; Fluid consistency: Thin  Diet effective now              EDUCATION NEEDS:   Not appropriate for education at this time  Skin:  Skin Assessment: Skin Integrity Issues: Skin Integrity Issues:: Incisions Incisions: closed abdomen and chest  Last BM:  Unknown  Height:   Ht Readings from Last 1 Encounters:  10/02/18 5\' 1"  (1.549 m)    Weight:   Wt Readings from Last 1 Encounters:  10/02/18 63.8 kg    Ideal Body Weight:  47.73 kg  BMI:  Body mass index is 26.59 kg/m.  Estimated Nutritional Needs:   Kcal:  1800-2000 kcals (30 kcal/kg +/- 100)  Protein:  95-110g Pro (1.5-1.7g/kg bw)  Fluid:  >1.8 L (30 ml/kg bw)    Thurman Sarver A. Jimmye Norman, RD, LDN, [place] Registered Dietitian II Certified Diabetes Care and Education Specialist Pager: [phone] After hours Pager: [phone]

## 2018-10-08 NOTE — Progress Notes (Signed)
6 Days Post-Op   Subjective/Chief Complaint: Pt is doing well.  Tolerated removal of epidural and foley.    Objective: Vital signs in last 24 hours: Temp:  [97.6 F (36.4 C)-98.4 F (36.9 C)] 97.6 F (36.4 C) (07/23 3244) Pulse Rate:  [60-80] 60 (07/23 0633) Resp:  [17-19] 17 (07/23 0102) BP: (112-127)/(71-78) 112/71 (07/23 7253) SpO2:  [96 %-100 %] 97 % (07/23 0633) Last BM Date: 10/07/18  Intake/Output from previous day: 07/22 0701 - 07/23 0700 In: 1787.7 [P.O.:380; I.V.:817.4; NG/GT:540.3; IV Piggyback:50] Out: 2130 [Urine:1775; Drains:355] Intake/Output this shift: No intake/output data recorded.  Exam: Awake and alert NAD Breathing comfortably. Abdomen soft, non distended, J tube in place.  JP with serosang drainage.    Lab Results:  Recent Labs    10/06/18 0528 10/07/18 0251  WBC 4.5 4.7  HGB 7.8* 8.2*  HCT 22.5* 22.9*  PLT 146* 152   BMET Recent Labs    10/06/18 0528 10/07/18 0251  NA 138 138  K 4.2 3.6  CL 108 107  CO2 24 23  GLUCOSE 144* 107*  BUN 8 6  CREATININE 0.74 0.65  CALCIUM 8.9 8.8*   PT/INR Recent Labs    10/07/18 1059  LABPROT 14.4  INR 1.1   ABG No results for input(s): PHART, HCO3 in the last 72 hours.  Invalid input(s): PCO2, PO2  Studies/Results: Dg Ugi W Single Cm (sol Or Thin Ba)  Result Date: 10/06/2018 CLINICAL DATA:  Gastric cancer, status post gastrectomy. EXAM: WATER SOLUBLE UPPER GI SERIES TECHNIQUE: Single-column upper GI series was performed using water soluble contrast. CONTRAST:  45mL OMNIPAQUE IOHEXOL 300 MG/ML  SOLN COMPARISON:  CT 09/20/2018 FLUOROSCOPY TIME:  Fluoroscopy Time:  3 minutes 12 seconds Radiation Exposure Index (if provided by the fluoroscopic device): Number of Acquired Spot Images: 0 FINDINGS: Scout film of the abdomen demonstrates NG tube, jejunostomy tube, and surgical drains in place. Nonobstructive bowel gas pattern. Fluoroscopic evaluation of swallowing demonstrates patency of esophagus and  esophagojejunostomy. No evidence of leak. Contrast fills the afferent and efferent limbs. Contrast passes freely downstream. No evidence of leak or obstruction downstream within the small bowel. IMPRESSION: No evidence of leak or obstruction. Electronically Signed   By: Rolm Baptise M.D.   On: 10/06/2018 09:19    Anti-infectives: Anti-infectives (From admission, onward)   Start     Dose/Rate Route Frequency Ordered Stop   10/02/18 2200  ceFAZolin (ANCEF) IVPB 2g/100 mL premix     2 g 200 mL/hr over 30 Minutes Intravenous Every 8 hours 10/02/18 1748 10/02/18 2117   10/02/18 0945  ceFAZolin (ANCEF) IVPB 2g/100 mL premix     2 g 200 mL/hr over 30 Minutes Intravenous On call to O.R. 10/02/18 0934 10/02/18 1237   10/02/18 0937  ceFAZolin (ANCEF) 2-4 GM/100ML-% IVPB    Note to Pharmacy: Lorne Skeens   : cabinet override      10/02/18 0937 10/02/18 1237      Assessment/Plan: s/p Procedure(s): LAPAROSCOPY DIAGNOSTIC (N/A) TOTAL GASTRECTOMY (N/A) FEEDING TUBE PLACEMENT (N/A)   Tolerating feeds.    Will need vitamins B12, calcium and MVI since post gastrectomy.  We do not have sublingual here, so will give an injection prior to d/c.  Check baseline b12 level in AM.    Oxy elixir.  PRN morphine and toradol. Advance to clears.  Goal would be fulls tomorrow and soft foods the next day if tolerates.  But VERY SOFT textured foods only and plan to d/c on this regimen.  Await pathology - gastric cancer s/p neoadjuvant chemotherapy.  Pre op clinical stage is T4N1-2M0.  Path stage ypT0N0, so pathologic complete response.  Discussed at multidisciplinary tumor board.  Will not need additional tx.    Ambulate.    Will plan to start transitioning other meds to PO on gradual basis.  Do not want to overwhelm at once.    Dispo plan will be home with tube feeds.  Barrier to d/c is getting her up to full feeds. She will need feeds for a while until she can get adequate calories by mouth.     LOS: 6  days    Mariah Kemp 10/08/2018

## 2018-10-08 NOTE — Telephone Encounter (Signed)
Called pt per 7/23 sch message- unable to reach pt . Left message with appt date and time

## 2018-10-09 MED ORDER — BACLOFEN 10 MG PO TABS: 10.0000 mg | ORAL_TABLET | Freq: Four times a day (QID) | ORAL | 0 refills | Status: DC | PRN

## 2018-10-09 MED ORDER — PRO-STAT SUGAR FREE PO LIQD: 30.0000 mL | Freq: Every day | ORAL | 0 refills | Status: DC

## 2018-10-09 MED ORDER — LOPERAMIDE HCL 2 MG PO CAPS: 2.0000 mg | ORAL_CAPSULE | Freq: Two times a day (BID) | ORAL | 0 refills | Status: DC | PRN

## 2018-10-09 MED ORDER — LEVOTHYROXINE SODIUM 75 MCG PO TABS
150.0000 ug | ORAL_TABLET | Freq: Every day | ORAL | Status: DC
  Administered 2018-10-10 – 2018-10-11 (×2): 150 ug via ORAL
  Filled 2018-10-09 (×3): qty 2

## 2018-10-09 MED ORDER — OSMOLITE 1.2 CAL PO LIQD: 1000.0000 mL | ORAL | 2 refills | Status: DC

## 2018-10-09 MED ORDER — BACLOFEN 10 MG PO TABS: 10.0000 mg | ORAL_TABLET | Freq: Four times a day (QID) | ORAL | Status: DC | PRN

## 2018-10-09 MED ORDER — OSMOLITE 1.2 CAL PO LIQD
1000.0000 mL | ORAL | Status: DC
  Administered 2018-10-09 – 2018-10-11 (×2): 1000 mL
  Filled 2018-10-09 (×4): qty 1000

## 2018-10-09 MED ORDER — OXYCODONE HCL 5 MG/5ML PO SOLN: 5.0000 mg | ORAL | 0 refills | Status: DC | PRN

## 2018-10-09 MED ORDER — MORPHINE SULFATE (PF) 2 MG/ML IV SOLN: 1.0000 mg | INTRAVENOUS | Status: DC | PRN

## 2018-10-09 MED ORDER — CALCIUM CARBONATE ANTACID 500 MG PO CHEW: 1.0000 | CHEWABLE_TABLET | Freq: Three times a day (TID) | ORAL | 11 refills | Status: DC

## 2018-10-09 NOTE — Progress Notes (Signed)
7 Days Post-Op   Subjective/Chief Complaint: Pt had some diarrhea yesterday likely associated with increased tube feeds.  Resolved with very low dose imodium.  Has not yet tried oxycodone elixir.  Was very sore last night and required multiple doses of IV meds.     Objective: Vital signs in last 24 hours: Temp:  [98.3 F (36.8 C)-98.4 F (36.9 C)] 98.3 F (36.8 C) (07/23 2200) Pulse Rate:  [74-78] 74 (07/23 2200) Resp:  [18] 18 (07/23 2200) BP: (115-125)/(70-72) 125/70 (07/23 2200) SpO2:  [99 %] 99 % (07/23 2200) Last BM Date: 10/08/18  Intake/Output from previous day: 07/23 0701 - 07/24 0700 In: 2357.8 [P.O.:540; I.V.:972.6; NG/GT:795.2; IV Piggyback:50] Out: 175 [Drains:175] Intake/Output this shift: Total I/O In: 240 [P.O.:240] Out: -   Exam: Awake and alert NAD Breathing comfortably. Abdomen soft, non distended, J tube in place.  JP with serosang drainage.    Lab Results:  Recent Labs    10/07/18 0251  WBC 4.7  HGB 8.2*  HCT 22.9*  PLT 152   BMET Recent Labs    10/07/18 0251  NA 138  K 3.6  CL 107  CO2 23  GLUCOSE 107*  BUN 6  CREATININE 0.65  CALCIUM 8.8*   PT/INR Recent Labs    10/07/18 1059  LABPROT 14.4  INR 1.1   ABG No results for input(s): PHART, HCO3 in the last 72 hours.  Invalid input(s): PCO2, PO2  Studies/Results: No results found.  Anti-infectives: Anti-infectives (From admission, onward)   Start     Dose/Rate Route Frequency Ordered Stop   10/02/18 2200  ceFAZolin (ANCEF) IVPB 2g/100 mL premix     2 g 200 mL/hr over 30 Minutes Intravenous Every 8 hours 10/02/18 1748 10/02/18 2117   10/02/18 0945  ceFAZolin (ANCEF) IVPB 2g/100 mL premix     2 g 200 mL/hr over 30 Minutes Intravenous On call to O.R. 10/02/18 0934 10/02/18 1237   10/02/18 0937  ceFAZolin (ANCEF) 2-4 GM/100ML-% IVPB    Note to Pharmacy: Lorne Skeens   : cabinet override      10/02/18 0937 10/02/18 1237      Assessment/Plan: s/p  Procedure(s): LAPAROSCOPY DIAGNOSTIC (N/A) TOTAL GASTRECTOMY (N/A) FEEDING TUBE PLACEMENT (N/A)   Tolerating feeds.    Will need vitamins B12, calcium and MVI since post gastrectomy.  We do not have sublingual here, so will give an injection prior to d/c.  B12 level starting out normal.    Oxy elixir.  PRN morphine and toradol. Space out morphine.  Oral muscle relaxant as well  Full liquids. VERY SOFT textured foods only and plan to d/c on this regimen.    Gastric cancer s/p neoadjuvant chemotherapy.  Pre op clinical stage is T4N1-2M0.  Path stage ypT0N0, so pathologic complete response.  Discussed at multidisciplinary tumor board.  Will not need additional tx.    D/c JP  Ambulate.    Will hold off on discharging with antihypertensive at this point.    Dispo plan will be home with tube feeds.  This is nearly to goal.   She will need feeds for a while until she can get adequate calories by mouth.  D/c Sunday if pain regimen and tube feeds stable.       LOS: 7 days    Mariah Kemp 10/09/2018

## 2018-10-09 NOTE — Discharge Summary (Signed)
Physician Discharge Summary  Patient ID: Mariah Kemp MRN: [id number] DOB/AGE: [birth year] 58 y.o.  Admit date: 10/02/2018 Discharge date: 10/11/2018  Admission Diagnoses: Adenocarcinoma of gastric cardia, [id number], s/p neoadjuvant chemotherapy. 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    Discharged Condition: stable  Hospital Course:  Pt was admitted to the floor s/p diagnostic laparoscopy, total gastrectomy (roux-en-y esophagojejunostomy reconstruction) and feeding jejunostomy for gastric cancer.  She had epidural and pca for pain control.  She was left NPO with NGT for 4 days.  Tube feeds were attempted on POD 2, but she had significant bloating.  These were restarted once she had flatus. She had upper GI that was negative for leak on POD 4.  She had nutrition consult regarding post gastrectomy diet.  Tube feeds were advanced to goal.  We transitioned from epidural to oxycodone elixir with prn baclofen for muscle relaxant.  She was advanced orally to full liquids. Drain was removed prior to d/c on POD 7 as all output was serosang and UGI was negative. She was ambulatory.  She was able to void with foley removed.  She was discharged to home on POD 9 in stable condition.    She had pathologic complete response for her cancer.    Consults: nutrition  Significant Diagnostic Studies: labs: Cr 0.65, WBCs 4.7, HCT 22.9 prior to d/c.    Treatments: surgery: see above and tube feeds.    Discharge Exam: Blood pressure 139/60, pulse 70, temperature 98.6 F (37 C), temperature source Oral, resp. rate 18, height 5\' 1"  (1.549 m), weight 63.8 kg, SpO2 100 %. General appearance: alert, cooperative and no distress Head: Normocephalic,  without obvious abnormality, chemotherapy induced alopecia Resp: breathing comfortably GI: soft, non tender, non distended Extremities: extremities normal, atraumatic, no cyanosis or edema  Disposition:    Allergies as of 10/11/2018   No Known Allergies     Medication List    STOP taking these medications   hydrochlorothiazide 25 MG tablet Commonly known as: HYDRODIURIL   Klor-Con M20 20 MEQ tablet Generic drug: potassium chloride SA   ondansetron 8 MG tablet Commonly known as: ZOFRAN   pantoprazole 40 MG tablet Commonly known as: Protonix   prochlorperazine 10 MG tablet Commonly known as: COMPAZINE     TAKE these medications   acyclovir 400 MG tablet Commonly known as: Zovirax Take 1 tablet (400 mg total) by mouth 3 (three) times daily. For five days. As needed for cold sore. What changed:   when to take this  reasons to take this  additional instructions   albuterol 108 (90 Base) MCG/ACT inhaler Commonly known as: VENTOLIN HFA Inhale 1-2 puffs into the lungs every 4 (four) hours as needed for wheezing or shortness of breath.   baclofen 10 MG tablet Commonly known as: LIORESAL Take 1 tablet (10 mg total) by mouth 4 (four) times daily as needed for muscle spasms.   calcium carbonate 500 MG chewable tablet Commonly known as: TUMS - dosed in mg elemental calcium Chew 1 tablet (200 mg of elemental calcium total) by mouth 3 (three) times daily.   cyanocobalamin 1000 MCG tablet Take 1 tablet (1,000 mcg total) by mouth daily.   feeding supplement (OSMOLITE 1.2 CAL) Liqd Place 1,000 mLs into feeding tube continuous.  levothyroxine 150 MCG tablet Commonly known as: SYNTHROID Take 1 tablet (150 mcg total) by mouth daily before breakfast.   lidocaine-prilocaine cream Commonly known as: EMLA Apply 1 application topically daily as needed (prior to port being accessed).   loperamide 2 MG capsule Commonly known as: IMODIUM Take 1 capsule (2 mg total) by mouth  2 (two) times daily as needed for diarrhea or loose stools.   oxyCODONE 5 MG/5ML solution Commonly known as: ROXICODONE Take 5-10 mLs (5-10 mg total) by mouth every 4 (four) hours as needed for moderate pain or severe pain.            Durable Medical Equipment  (From admission, onward)         Start     Ordered   10/09/18 0848  For home use only DME Tube feeding  Once    Comments: Osmolite 1.2 or equivalent @ ml/hr.   50ml Prostat daily or equivalent    150 ml free water flush every 6 hours  Tube feeding regimen provides1828kcal (100% of needs),95grams of protein, and 1186ml of H2O (total free water 1781 with inclusion of free water flush regimen).  NUTRITION DIAGNOSIS:   Inadequate oral intake related to inability to eat as evidenced by NPO status.   Patient will meet greater than or equal to 90% of their needs  Patient will need tube feedings greater than 90 days   10/09/18 0849         Follow-up Information    [facility] Follow up.   Specialty: Home Health Services Contact information: [address] [place] [address] [phone]        [facility] Follow up.   Why: [facility] will provide tube feeding  Contact information: [place] [address] [phone]        [name], MD Follow up in 2 week(s).   Specialty: General Surgery Contact information: [address] [place] [address] [phone]           Signed: [name] 10/09/2018, 4:35 PM

## 2018-10-09 NOTE — TOC Progression Note (Addendum)
Transition of Care [facility]) - Progression Note    Patient Details  Name: Ceana Fiala MRN: [id number] Date of Birth: Aug 17, 1960  Transition of Care [facility] CM/SW Contact  Jacalyn Lefevre Edson Snowball, RN Phone Number: 10/09/2018, 2:19 PM  Clinical Narrative:     Possible discharge on Sunday. Tommi Rumps with aware, Zack with [place] aware. Order for tube feedings in Epic signed, Zack requesting a hard copy of tube feeding be signed. Texted Dr Barry Dienes awaiting callback.DR Barry Dienes signed form and faxed to [facility] with [facility]   Expected Discharge Plan: [place] Barriers to Discharge: Continued Medical Work up  Expected Discharge Plan and Services Expected Discharge Plan: [place]   Discharge Planning Services: CM Consult Post Acute Care Choice: [place] arrangements for the past 2 months: Single Family Home Expected Discharge Date: 10/12/18               DME Arranged: Tube feeding pump, Tube feeding(will need orders) DME Agency: [employer] Date DME Agency Contacted: 10/07/18 Time DME Agency Contacted: 1217 Representative spoke with at DME Agency: [place]: RN [place] Agency: [place] Date [place]: 10/07/18 Time [place]: 1221 Representative spoke with at [place]: [place] ([place]) Interventions    Readmission Risk Interventions No flowsheet data found.

## 2018-10-09 NOTE — Progress Notes (Signed)
Nutrition Follow-up  DOCUMENTATION CODES:   Not applicable  INTERVENTION:   -Continue Osmolite 1.2@ 54m/hr via j-tube   359mProstat daily.  When no IVFS, recommend 150 ml free water flush every 6 hours  Tube feeding regimen provides1828kcal (100% of needs),95grams of protein, and 11812mf H2O (total free water 1781 with inclusion of free water flush regimen).  NUTRITION DIAGNOSIS:   Inadequate oral intake related to inability to eat as evidenced by NPO status.  Progressing; advanced to full liquid diet and TF at goal  GOAL:   Patient will meet greater than or equal to 90% of their needs  Met with TF  MONITOR:   Diet advancement, Labs, Weight trends, TF tolerance, Skin, I & O's  REASON FOR ASSESSMENT:   Consult Assessment of nutrition requirement/status, Enteral/tube feeding initiation and management  ASSESSMENT:   [age]o female PMHx HTN and gastric cancer. Pt diagnosed this past January. Treated w/ neoadjuvant chemotherapy 1/29-5/6. Underwent total gastrectomy with roux-en-y esophagojejunostomy and placement of feeding jejunostomy 7/17.  7/17- s/ptotal gastrectomy with roux-en-y esophagojejunostomy and placement of feeding jejunostomy 7/17 7/19- TF started, TF stopped due to poor tolerance  7/21- NGT removed, UGI negative for leak 7/22- advanced to clear liquid diet, TF resumed @ 20 ml/hr 7/23- advanced to full liquid diet, TF advanced to 40 ml/hr 7/24- TF advanced to goal  Reviewed I/O's: +2.2 L x 24 hours and +153 ml since admission  Drain output: 175 ml x 24 hours  Spoke with pt at bedside, who was pleasant and in good spirits today. She reports sipping on fluids without difficulty. She has some discomfort with cold water, but tolerated sips of tea and soy milk without difficulty. She consumed a spoonful of grits and had a sensation of it getting stuck in her throat. She had multiple questions regarding post-gastrectomy diet and soft solids,  which RD reviewed with her. Encouraged her to take small bites of sips of foods that were appealing to her.   TF advanced to goal rate this morning. Per discussion with RN, pt tolerating well. Also discussed with RNCM, who has provided TF orders for home to home health agency.   Per general surgery notes, will provide sublingual vitamin injections at d/c (likely Sunday, 10/11/18).   Labs reviewed.   Diet Order:   Diet Order            Diet full liquid Room service appropriate? Yes; Fluid consistency: Thin  Diet effective now              EDUCATION NEEDS:   Not appropriate for education at this time  Skin:  Skin Assessment: Skin Integrity Issues: Skin Integrity Issues:: Incisions Incisions: closed abdomen and chest  Last BM:  10/08/18  Height:   Ht Readings from Last 1 Encounters:  10/02/18 '5\' 1"'$  (1.549 m)    Weight:   Wt Readings from Last 1 Encounters:  10/02/18 63.8 kg    Ideal Body Weight:  47.73 kg  BMI:  Body mass index is 26.59 kg/m.  Estimated Nutritional Needs:   Kcal:  1800-2000 kcals (30 kcal/kg +/- 100)  Protein:  95-110g Pro (1.5-1.7g/kg bw)  Fluid:  >1.8 L (30 ml/kg bw)    Yukio Bisping A. WilJimmye NormanD, LDN, CDCSayrevillegistered Dietitian II Certified Diabetes Care and Education Specialist Pager: [phone] hours Pager: [phone]

## 2018-10-10 NOTE — TOC Progression Note (Signed)
Transition of Care [facility]) - Progression Note    Patient Details  Name: Mariah Kemp MRN: [id number] Date of Birth: May 20, 1960  [facility]) CM/SW Contact  Claudie Leach, RN Phone Number: 10/10/2018, 4:52 PM  Clinical Narrative:    Patient planning to dc tomorrow. Alvis Lemmings RN will meet patient at home tomorrow afternoon to teach patient to administer TF and operate pump.  Patient will need to be discharged by noon tomorrow to facilitate home RN visit.    Contacted [employer] to schedule TF/pump delivery tonight or early morning.  Will need to verify with husband that TF is delivered prior to d/c.     Expected Discharge Plan: [place] Barriers to Discharge: Continued Medical Work up  Expected Discharge Plan and Services Expected Discharge Plan: [place]   Discharge Planning Services: CM Consult Post Acute Care Choice: [place] arrangements for the past 2 months: Single Family Home Expected Discharge Date: 10/12/18               DME Arranged: Tube feeding pump, Tube feeding(will need orders) DME Agency: [employer] Date DME Agency Contacted: 10/07/18 Time DME Agency Contacted: 1217 Representative spoke with at DME Agency: [place]: RN [facility] Agency: [place] Date [place]: 10/07/18 Time [place]: 1221 Representative spoke with at [place]: Tommi Rumps

## 2018-10-10 NOTE — Progress Notes (Signed)
Patient ID: Mariah Kemp, female   DOB: November 14, 1960, 58 y.o.   MRN: [id number] [facility] Progress Note:   8 Days Post-Op  Subjective: Mental status is clear;  She experienced severe burning pain with the ProStaf supplement-discontinued Objective: Vital signs in last 24 hours: Temp:  [98.2 F (36.8 C)-98.6 F (37 C)] 98.2 F (36.8 C) (07/25 0516) Pulse Rate:  [70-75] 75 (07/25 0516) Resp:  [18-19] 18 (07/25 0516) BP: (104-139)/(60-72) 120/72 (07/25 0516) SpO2:  [97 %-100 %] 97 % (07/25 0516)  Intake/Output from previous day: 07/24 0701 - 07/25 0700 In: 1809 [P.O.:600; NG/GT:1209] Out: 30 [Drains:30] Intake/Output this shift: No intake/output data recorded.  Physical Exam: Work of breathing is normal;  She is doing well and hopeful discharge tomorrow  Lab Results:  No results found for this or any previous visit (from the past 48 hour(s)).  Radiology/Results: No results found.  Anti-infectives: Anti-infectives (From admission, onward)   Start     Dose/Rate Route Frequency Ordered Stop   10/02/18 2200  ceFAZolin (ANCEF) IVPB 2g/100 mL premix     2 g 200 mL/hr over 30 Minutes Intravenous Every 8 hours 10/02/18 1748 10/02/18 2117   10/02/18 0945  ceFAZolin (ANCEF) IVPB 2g/100 mL premix     2 g 200 mL/hr over 30 Minutes Intravenous On call to O.R. 10/02/18 0934 10/02/18 1237   10/02/18 0937  ceFAZolin (ANCEF) 2-4 GM/100ML-% IVPB    Note to Pharmacy: Lorne Skeens   : cabinet override      10/02/18 0937 10/02/18 1237      Assessment/Plan: Problem List: Patient Active Problem List   Diagnosis Date Noted  . S/P gastrectomy 10/02/2018  . 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 Port-A-Cath in place 05/13/2018  . Iron deficiency anemia due to chronic blood loss 04/03/2018  . Gastric cancer ([place]) 04/02/2018  . Acute upper GI bleed 03/31/2018  . Acute gastric ulcer without hemorrhage, perforation or obstruction 03/31/2018  . Symptomatic anemia  03/31/2018  . B12 deficiency 03/31/2018  . Gastrointestinal hemorrhage   . Palpitations 03/27/2018  . Moderate intermittent asthma without complication 01/60/1093  . Mixed hyperlipidemia 09/24/2016  . Elevated fasting glucose 09/24/2016  . Cold sore 04/02/2016  . Nasal polyposis 09/07/2014  . Hypertriglyceridemia 08/24/2014  . Hypothyroidism 08/24/2014  . History of breast cancer 08/19/2014  . Chronic bronchitis ([place]) 05/31/2014  . Essential hypertension 05/31/2014    Plan discharge tomorrow.   8 Days Post-Op    LOS: 8 days   Matt B. Hassell Done, MD, [facility] [phone] beeper [phone]  10/10/2018 10:12 AM

## 2018-10-11 MED ORDER — HEPARIN SOD (PORK) LOCK FLUSH 100 UNIT/ML IV SOLN
500.0000 [IU] | INTRAVENOUS | Status: AC | PRN
  Administered 2018-10-11: 500 [IU]

## 2018-10-11 NOTE — Plan of Care (Signed)
  Problem: Education: Goal: Knowledge of General Education information will improve Description: Including pain rating scale, medication(s)/side effects and non-pharmacologic comfort measures Outcome: Progressing   Problem: Health Behavior/Discharge Planning: Goal: Ability to manage health-related needs will improve Outcome: Progressing   Problem: Clinical Measurements: Goal: Ability to maintain clinical measurements within normal limits will improve Outcome: Progressing Goal: Will remain free from infection Outcome: Progressing Goal: Cardiovascular complication will be avoided Outcome: Progressing   Problem: Activity: Goal: Risk for activity intolerance will decrease Outcome: Progressing   Problem: Nutrition: Goal: Adequate nutrition will be maintained Outcome: Progressing   Problem: Coping: Goal: Level of anxiety will decrease Outcome: Progressing   Problem: Elimination: Goal: Will not experience complications related to bowel motility Outcome: Progressing Goal: Will not experience complications related to urinary retention Outcome: Progressing   Problem: Pain Managment: Goal: General experience of comfort will improve Outcome: Progressing   Problem: Safety: Goal: Ability to remain free from injury will improve Outcome: Progressing   Problem: Skin Integrity: Goal: Risk for impaired skin integrity will decrease Outcome: Progressing   Problem: Clinical Measurements: Goal: Ability to maintain clinical measurements within normal limits will improve Outcome: Progressing Goal: Postoperative complications will be avoided or minimized Outcome: Progressing   Problem: Skin Integrity: Goal: Demonstration of wound healing without infection will improve Outcome: Progressing

## 2018-10-11 NOTE — TOC Transition Note (Signed)
Transition of Care [facility]) - CM/SW Discharge Note   Patient Details  Name: Mariah Kemp MRN: [id number] Date of Birth: 02-Apr-1960  Transition of Care [facility]) CM/SW Contact:  Claudie Leach, RN Phone Number: 10/11/2018, 11:56 AM   Clinical Narrative:    Patient discharged home with plan for tube feeding/pump to be delivered by 2 and HHRN to visit at 3.  Confirmed with Gwinda Passe from [place] from [place].   Final next level of care: [place] Barriers to Discharge: Continued Medical Work up   Patient Goals and CMS Choice Patient states their goals for this hospitalization and ongoing recovery are:: to go home CMS Medicare.gov Compare Post Acute Care list provided to:: Patient Choice offered to / list presented to : Patient   Discharge Plan and Services   Discharge Planning Services: CM Consult Post Acute Care Choice: Home Health          DME Arranged: Tube feeding pump, Tube feeding(will need orders) DME Agency: [employer] Date DME Agency Contacted: 10/07/18 Time DME Agency Contacted: 1217 Representative spoke with at DME Agency: [place]: RN [facility] Agency: [place] Date [place]: 10/07/18 Time [place]: 1221 Representative spoke with at [place]: Tommi Rumps

## 2018-10-12 LAB — TYPE AND SCREEN
ABO/RH(D): B POS
Antibody Screen: POSITIVE
Donor AG Type: NEGATIVE
Donor AG Type: NEGATIVE
PT AG Type: NEGATIVE
Unit division: 0
Unit division: 0

## 2018-10-12 LAB — BPAM RBC
Blood Product Expiration Date: 202008172359
Blood Product Expiration Date: 202008182359
Unit Type and Rh: 7300
Unit Type and Rh: 7300

## 2018-10-15 ENCOUNTER — Other Ambulatory Visit: Payer: Self-pay | Admitting: General Surgery

## 2018-10-15 DIAGNOSIS — Z903 Acquired absence of stomach [part of]: Secondary | ICD-10-CM

## 2018-10-16 ENCOUNTER — Ambulatory Visit
Admission: RE | Admit: 2018-10-16 | Discharge: 2018-10-16 | Disposition: A | Discharge status: Home / Self Care | Payer: 59 | Source: Ambulatory Visit | Attending: General Surgery | Admitting: General Surgery

## 2018-10-16 DIAGNOSIS — Z903 Acquired absence of stomach [part of]: Secondary | ICD-10-CM

## 2018-10-19 ENCOUNTER — Other Ambulatory Visit: Payer: Self-pay | Admitting: Osteopathic Medicine

## 2018-10-19 DIAGNOSIS — E039 Hypothyroidism, unspecified: Secondary | ICD-10-CM

## 2018-10-20 ENCOUNTER — Telehealth: Payer: Self-pay | Admitting: Nutrition

## 2018-10-20 NOTE — Telephone Encounter (Signed)
Left VM to confirm appt and verify info °

## 2018-10-21 ENCOUNTER — Inpatient Hospital Stay: Payer: BLUE CROSS/BLUE SHIELD | Attending: Hematology | Admitting: Nutrition

## 2018-10-21 NOTE — Addendum Note (Signed)
Addended by: Maryla Morrow on: 10/21/2018 12:14 PM   Modules accepted: Orders

## 2018-10-21 NOTE — Progress Notes (Signed)
I attempted nutrition follow-up over the telephone.  Patient was recently discharged from the hospital on tube feeding. Patient was not available.  I left a message for patient to call me back with my name and contact information.

## 2018-10-21 NOTE — Telephone Encounter (Signed)
FYI - Left a detailed vm msg for pt that labs for thyroid check is due. Aware to have labs drawn sometime this week. #30 of thyroid med sent to the pharmacy. Pls place an order for thyroid check.

## 2018-10-24 ENCOUNTER — Other Ambulatory Visit: Payer: Self-pay

## 2018-10-24 ENCOUNTER — Emergency Department (HOSPITAL_COMMUNITY)
Admission: EM | Admit: 2018-10-24 | Discharge: 2018-10-24 | Disposition: A | Discharge status: Home / Self Care | Payer: BLUE CROSS/BLUE SHIELD | Attending: Emergency Medicine | Admitting: Emergency Medicine

## 2018-10-24 ENCOUNTER — Emergency Department (HOSPITAL_COMMUNITY): Payer: BLUE CROSS/BLUE SHIELD

## 2018-10-24 ENCOUNTER — Telehealth: Payer: Self-pay | Admitting: General Surgery

## 2018-10-24 ENCOUNTER — Encounter (HOSPITAL_COMMUNITY): Payer: Self-pay | Admitting: Emergency Medicine

## 2018-10-24 DIAGNOSIS — Z79899 Other long term (current) drug therapy: Secondary | ICD-10-CM | POA: Diagnosis not present

## 2018-10-24 DIAGNOSIS — E039 Hypothyroidism, unspecified: Secondary | ICD-10-CM | POA: Insufficient documentation

## 2018-10-24 DIAGNOSIS — Z9049 Acquired absence of other specified parts of digestive tract: Secondary | ICD-10-CM | POA: Diagnosis not present

## 2018-10-24 DIAGNOSIS — J4520 Mild intermittent asthma, uncomplicated: Secondary | ICD-10-CM | POA: Insufficient documentation

## 2018-10-24 DIAGNOSIS — I10 Essential (primary) hypertension: Secondary | ICD-10-CM | POA: Diagnosis not present

## 2018-10-24 DIAGNOSIS — Z87891 Personal history of nicotine dependence: Secondary | ICD-10-CM | POA: Insufficient documentation

## 2018-10-24 DIAGNOSIS — L03311 Cellulitis of abdominal wall: Secondary | ICD-10-CM | POA: Insufficient documentation

## 2018-10-24 DIAGNOSIS — R0789 Other chest pain: Secondary | ICD-10-CM | POA: Diagnosis not present

## 2018-10-24 DIAGNOSIS — R079 Chest pain, unspecified: Secondary | ICD-10-CM | POA: Diagnosis not present

## 2018-10-24 DIAGNOSIS — K228 Other specified diseases of esophagus: Secondary | ICD-10-CM | POA: Diagnosis not present

## 2018-10-24 LAB — COMPREHENSIVE METABOLIC PANEL
ALT: 38 U/L (ref 0–44)
AST: 26 U/L (ref 15–41)
Albumin: 2.4 g/dL — ABNORMAL LOW (ref 3.5–5.0)
Alkaline Phosphatase: 112 U/L (ref 38–126)
Anion gap: 11 (ref 5–15)
BUN: 24 mg/dL — ABNORMAL HIGH (ref 6–20)
CO2: 26 mmol/L (ref 22–32)
Calcium: 9 mg/dL (ref 8.9–10.3)
Chloride: 99 mmol/L (ref 98–111)
Creatinine, Ser: 0.61 mg/dL (ref 0.44–1.00)
GFR calc Af Amer: >60 mL/min (ref >60)
GFR calc non Af Amer: >60 mL/min (ref >60)
Glucose, Bld: 97 mg/dL (ref 70–99)
Potassium: 4.8 mmol/L (ref 3.5–5.1)
Sodium: 136 mmol/L (ref 135–145)
Total Bilirubin: 0.4 mg/dL (ref 0.3–1.2)
Total Protein: 8.1 g/dL (ref 6.5–8.1)

## 2018-10-24 LAB — CBC WITH DIFFERENTIAL/PLATELET
Abs Immature Granulocytes: 0.08 10*3/uL — ABNORMAL HIGH (ref 0.00–0.07)
Basophils Absolute: 0 10*3/uL (ref 0.0–0.1)
Basophils Relative: 0 %
Eosinophils Absolute: 0.2 10*3/uL (ref 0.0–0.5)
Eosinophils Relative: 2 %
HCT: 27.6 % — ABNORMAL LOW (ref 36.0–46.0)
Hemoglobin: 8.8 g/dL — ABNORMAL LOW (ref 12.0–15.0)
Immature Granulocytes: 1 %
Lymphocytes Relative: 24 %
Lymphs Abs: 2 10*3/uL (ref 0.7–4.0)
MCH: 32.6 pg (ref 26.0–34.0)
MCHC: 31.9 g/dL (ref 30.0–36.0)
MCV: 102.2 fL — ABNORMAL HIGH (ref 80.0–100.0)
Monocytes Absolute: 0.7 10*3/uL (ref 0.1–1.0)
Monocytes Relative: 8 %
Neutro Abs: 5.3 10*3/uL (ref 1.7–7.7)
Neutrophils Relative %: 65 %
Platelets: 366 10*3/uL (ref 150–400)
RBC: 2.7 MIL/uL — ABNORMAL LOW (ref 3.87–5.11)
RDW: 12.7 % (ref 11.5–15.5)
WBC: 8.2 10*3/uL (ref 4.0–10.5)
nRBC: 0 % (ref 0.0–0.2)

## 2018-10-24 LAB — TROPONIN I (HIGH SENSITIVITY)
Troponin I (High Sensitivity): 3 ng/L (ref <18)
Troponin I (High Sensitivity): 2 ng/L (ref <18)

## 2018-10-24 MED ORDER — HEPARIN SOD (PORK) LOCK FLUSH 100 UNIT/ML IV SOLN
500.0000 [IU] | Freq: Once | INTRAVENOUS | Status: AC
  Administered 2018-10-24: 500 [IU]
  Filled 2018-10-24: qty 5

## 2018-10-24 MED ORDER — CEPHALEXIN 250 MG/5ML PO SUSR: 500.0000 mg | Freq: Four times a day (QID) | ORAL | 0 refills | Status: AC

## 2018-10-24 MED ORDER — ONDANSETRON HCL 4 MG/2ML IJ SOLN
4.0000 mg | Freq: Once | INTRAMUSCULAR | Status: AC
  Administered 2018-10-24: 4 mg via INTRAVENOUS
  Filled 2018-10-24: qty 2

## 2018-10-24 MED ORDER — SODIUM CHLORIDE 0.9 % IV BOLUS
1000.0000 mL | Freq: Once | INTRAVENOUS | Status: AC
  Administered 2018-10-24: 1000 mL via INTRAVENOUS

## 2018-10-24 MED ORDER — IOHEXOL 350 MG/ML SOLN
100.0000 mL | Freq: Once | INTRAVENOUS | Status: AC | PRN
  Administered 2018-10-24: 100 mL via INTRAVENOUS

## 2018-10-24 MED ORDER — MORPHINE SULFATE (PF) 4 MG/ML IV SOLN
4.0000 mg | Freq: Once | INTRAVENOUS | Status: AC
  Administered 2018-10-24: 4 mg via INTRAVENOUS
  Filled 2018-10-24: qty 1

## 2018-10-24 NOTE — Discharge Instructions (Signed)
It was my pleasure taking care of you today!   Please take all of your antibiotics until finished!  Follow up with your surgery team. Give them a call on Monday.   Return to ER for new or worsening symptoms, any additional concerns.

## 2018-10-24 NOTE — ED Notes (Signed)
Pt's port de-accessed with heparin flush, and covered with band aid.

## 2018-10-24 NOTE — ED Notes (Signed)
Patient transported to CT 

## 2018-10-24 NOTE — ED Notes (Signed)
Patient verbalizes understanding of discharge instructions. Opportunity for questioning and answers were provided. Armband removed by staff, pt discharged from ED.  

## 2018-10-24 NOTE — Telephone Encounter (Signed)
Patient status post partial gastrectomy October 02, 2018.  She calls the office today complaining of chest pain and a feeling of "someone standing on top of my chest".  She is also having dysphasia to liquids.  I recommended that she be evaluated in the emergency department immediately to rule out any cardiac issues and have her swallowing evaluated.  Rosario Adie, MD  [facility]

## 2018-10-24 NOTE — ED Notes (Signed)
Got patient on the monitor did ekg shown Dr Wilson Singer patient is resting with call bell in reach and family at bedside

## 2018-10-24 NOTE — Progress Notes (Signed)
Subjective Ongoing issues with dypshagia since her surgery which is new relative to baseline. Denies n/v/abdominal pain/bloating. Tolerating tube feeds at goal - currently 60cc/hr 24hrs a day - increased to 72 for 20hrs but increased distention so self-de-escalated regimen. Tube working well. Reports sensation of deep chest pain with intake that resolves after she is not taking anything by mouth. Denies f/c.  Objective: Vital signs in last 24 hours: Temp:  [97.7 F (36.5 C)] 97.7 F (36.5 C) (08/08 0958) Pulse Rate:  [57-79] 79 (08/08 1530) Resp:  [13-22] 13 (08/08 1530) BP: (82-123)/(46-74) 101/62 (08/08 1400) SpO2:  [94 %-100 %] 98 % (08/08 1530) Weight:  [64 kg] 64 kg (08/08 1056)    Intake/Output from previous day: No intake/output data recorded. Intake/Output this shift: Total I/O In: 1000 [IV Piggyback:1000] Out: -   Gen: NAD, comfortable CV: RRR Pulm: Normal work of breathing Abd: Soft, NT/ND; incisions clean; midline incision with scant erythema - reportedly had some purulent fluid drain on expression by ED team but none since. This was where it was probed in the office by Dr. Barry Dienes.  Lab Results: CBC  Recent Labs    10/24/18 1049  WBC 8.2  HGB 8.8*  HCT 27.6*  PLT 366   BMET Recent Labs    10/24/18 1049  NA 136  K 4.8  CL 99  CO2 26  GLUCOSE 97  BUN 24*  CREATININE 0.61  CALCIUM 9.0   PT/INR No results for input(s): LABPROT, INR in the last 72 hours. ABG No results for input(s): PHART, HCO3 in the last 72 hours.  Invalid input(s): PCO2, PO2  Studies/Results:  Anti-infectives: Anti-infectives (From admission, onward)   None       Assessment/Plan: Patient Active Problem List   Diagnosis Date Noted  . S/P gastrectomy 10/02/2018  . 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 Port-A-Cath in place 05/13/2018  . Iron deficiency anemia due to chronic blood loss 04/03/2018  . Gastric cancer ([place]) 04/02/2018  . Acute upper GI bleed  03/31/2018  . Acute gastric ulcer without hemorrhage, perforation or obstruction 03/31/2018  . Symptomatic anemia 03/31/2018  . B12 deficiency 03/31/2018  . Gastrointestinal hemorrhage   . Palpitations 03/27/2018  . Moderate intermittent asthma without complication 69/62/9528  . Mixed hyperlipidemia 09/24/2016  . Elevated fasting glucose 09/24/2016  . Cold sore 04/02/2016  . Nasal polyposis 09/07/2014  . Hypertriglyceridemia 08/24/2014  . Hypothyroidism 08/24/2014  . History of breast cancer 08/19/2014  . Chronic bronchitis ([place]) 05/31/2014  . Essential hypertension 05/31/2014  Mariah Kemp is a very pleasnt 76yoF whom underwent total gastrectomy with roux-en-y reconstruction - EJ; J-tube 7/17 by my partner, Dr. Barry Dienes, for gasric antrum adenoCA [phone]) following neoadj chemotherapy; [id number]  -CTA chest and CT A/P demonstrate no acute findings including undrained collections in subQ. No PE. -EKG - sinus rhythm, troponin I normal  -UGI has shown appropriate drainage as of 7/31 without blockages -Tube feeds are maintaining nutrition and hydration -WBC normal; minimal erythema - discussed with ED team, [name] - planning outpt liquid form of abx which can be given via tube to complete course -We discussed covering mildine wound with dressing, changing daily. Soap/water over incision is ok and encouraged daily -Also Rx for carafate elixor to be taken by mouth for possible reflux like symptoms -I discussed options with her moving forward and given ability to maintain hydration/nutrition with tube ongoing outpatient workup and evaluation - she is agreeable to this plan -Will plan for her to  see Dr. Barry Dienes in the next week or so for wound check. She was instructed to contact us if additional questions/concerns arise   LOS: 0 days   Sharon Mt. Dema Severin, M.D. [facility]

## 2018-10-24 NOTE — ED Provider Notes (Signed)
[facility] EMERGENCY DEPARTMENT Provider Note   CSN: [id number] Arrival date & time: 10/24/18  0946    History   Chief Complaint Chief Complaint  Patient presents with   Chest Pain    HPI Mariah Kemp is a 58 y.o. female.     The history is provided by the patient and medical records. No language interpreter was used.   Mariah Kemp is a 58 y.o. female  with a complex PMH as listed below including gastric cancer followed by Dr. Barry Dienes who presents to the Emergency Department complaining of central chest pain for about 1 week. Patient reports pain is sharp and feels like something squeezing. It is not exertional. It will occur while just sitting at rest, but often is exacerbated by trying to drink something or take her medications.  Even the smallest amount of liquid seems to exacerbate her pain.  She reports associated nausea as well as a few episodes of vomiting. She has tried to take her home oxycodone and nausea medications, but this morning threw them up. She called the surgery clinic who recommended that she come to the emergency department for further evaluation.  She tells me that she has had 2 swallow studies done in the last month or so to evaluate her swallowing, so she knows nothing is truly getting stuck, but that is what it feels like when she tries to drink something or take meds.  She also notes that her surgical wound on her abdomen has been a little more sore recently.  She thought the drainage was normal, but this morning, and seem to be a little worse.  She denies any fevers.  No cough, congestion or shortness of breath.   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   Port-A-Cath in place  05/13/2018   Iron deficiency anemia due to chronic blood loss 04/03/2018   Gastric cancer ([place]) 04/02/2018   Acute upper GI bleed 03/31/2018   Acute gastric ulcer without hemorrhage, perforation or obstruction 03/31/2018   Symptomatic anemia 03/31/2018   B12 deficiency 03/31/2018   Gastrointestinal hemorrhage    Palpitations 03/27/2018   Moderate intermittent asthma without complication 26/71/2458   Mixed hyperlipidemia 09/24/2016   Elevated fasting glucose 09/24/2016   Cold sore 04/02/2016   Nasal polyposis 09/07/2014   Hypertriglyceridemia 08/24/2014   Hypothyroidism 08/24/2014   History of breast cancer 08/19/2014   Chronic bronchitis ([place]) 05/31/2014   Essential hypertension 05/31/2014    Past Surgical History:  Procedure Laterality Date   ABDOMINAL HYSTERECTOMY  1996   BILATERAL SALPINGOOPHORECTOMY  2007   BIOPSY  03/29/2018   Procedure: BIOPSY;  Surgeon: Wilford Corner, MD;  Location: [place];  Service: Endoscopy;;   CARPAL TUNNEL RELEASE     COLONOSCOPY N/A 03/29/2018   Procedure: COLONOSCOPY;  Surgeon: Wilford Corner, MD;  Location: [facility] END[facility]COPY;  Service: Endoscopy;  Laterality: N/A;   ESOPHAGOGASTRODUODENOSCOPY N/A 03/29/2018   Procedure: ESOPHAGOGASTRODUODENOSCOPY (EGD);  Surgeon: Wilford Corner, MD;  Location: [place];  Service: Endoscopy;  Laterality: N/A;   GASTRECTOMY N/A 10/02/2018   Procedure: TOTAL GASTRECTOMY;  Surgeon: [name], MD;  Location: [place];  Service: General;  Laterality: N/A;  GASTROSTOMY N/A 10/02/2018   Procedure: FEEDING TUBE PLACEMENT;  Surgeon: [name], MD;  Location: [place];  Service: General;  Laterality: N/A;   LAPAROSCOPY N/A 04/13/2018   Procedure: LAPAROSCOPY DIAGNOSTIC;  Surgeon: [name], MD;  Location: [facility];  Service: General;  Laterality: N/A;   LAPAROSCOPY N/A 10/02/2018   Procedure: LAPAROSCOPY DIAGNOSTIC;  Surgeon: [name], MD;  Location: [place];  Service: General;   Laterality: N/A;   MASTECTOMY  2007   PORTACATH PLACEMENT Left 04/13/2018   Procedure: INSERTION [place];  Surgeon: [name], MD;  Location: [facility];  Service: General;  Laterality: Left;  subclavian     OB History   No obstetric history on file.      Home Medications    Prior to Admission medications   Medication Sig Start Date End Date Taking? Authorizing Provider  acyclovir (ZOVIRAX) 400 MG tablet Take 1 tablet (400 mg total) by mouth 3 (three) times daily. For five days. As needed for cold sore. Patient taking differently: Take 400 mg by mouth 3 (three) times daily as needed (cold sores.).  02/24/18   Emeterio Reeve, DO  albuterol (PROVENTIL HFA;VENTOLIN HFA) 108 (90 Base) MCG/ACT inhaler Inhale 1-2 puffs into the lungs every 4 (four) hours as needed for wheezing or shortness of breath. 09/16/17   Emeterio Reeve, DO  baclofen (LIORESAL) 10 MG tablet Take 1 tablet (10 mg total) by mouth 4 (four) times daily as needed for muscle spasms. 10/09/18   [name], MD  calcium carbonate (TUMS - DOSED IN MG ELEMENTAL CALCIUM) 500 MG chewable tablet Chew 1 tablet (200 mg of elemental calcium total) by mouth 3 (three) times daily. 10/09/18   [name], MD  cephALEXin (KEFLEX) 250 MG/5ML suspension Place 10 mLs (500 mg total) into feeding tube 4 (four) times daily for 7 days. 10/24/18 10/31/18  Samyrah Bruster, Ozella Almond, PA-C  levothyroxine (SYNTHROID) 150 MCG tablet TAKE 1 TABLET BY MOUTH EVERY DAY BEFORE BREAKFAST 10/21/18   Emeterio Reeve, DO  lidocaine-prilocaine (EMLA) cream Apply 1 application topically daily as needed (prior to port being accessed).  04/21/18   [provider]  loperamide (IMODIUM) 2 MG capsule Take 1 capsule (2 mg total) by mouth 2 (two) times daily as needed for diarrhea or loose stools. 10/09/18   [name], MD  Nutritional Supplements (FEEDING SUPPLEMENT, OSMOLITE 1.2 CAL,) LIQD Place 1,000 mLs into feeding tube continuous. 10/09/18    [name], MD  oxyCODONE (ROXICODONE) 5 MG/5ML solution Take 5-10 mLs (5-10 mg total) by mouth every 4 (four) hours as needed for moderate pain or severe pain. 10/09/18   [name], MD  vitamin B-12 1000 MCG tablet Take 1 tablet (1,000 mcg total) by mouth daily. 04/01/18   Dhungel, Flonnie Overman, MD    Family History Family History  Problem Relation Age of Onset   Cancer Father        liver CA   Cancer Paternal Uncle        unknown type cancer    Social History Social History   Tobacco Use   Smoking status: Former Smoker    Packs/day: 0.50    Years: 15.00    Pack years: 7.50    Quit date: 04/27/1994    Years since quitting: 24.5   Smokeless tobacco: Never Used  Substance Use Topics   Alcohol use: No    Alcohol/week: 0.0 standard drinks   Drug use: No     Allergies   Patient has no known  allergies.   Review of Systems Review of Systems  Cardiovascular: Positive for chest pain. Negative for palpitations and leg swelling.  Gastrointestinal: Positive for abdominal pain.  Skin: Positive for color change.  All other systems reviewed and are negative.    Physical Exam Updated Vital Signs BP 101/62    Pulse 79    Temp 97.7 F (36.5 C) (Oral)    Resp 13    Ht 5\' 1"  (1.549 m)    Wt 64 kg    SpO2 98%    BMI 26.66 kg/m   Physical Exam Vitals signs and nursing note reviewed.  Constitutional:      General: She is not in acute distress.    Appearance: She is well-developed.  HENT:     Head: Normocephalic and atraumatic.  Neck:     Musculoskeletal: Neck supple.  Cardiovascular:     Rate and Rhythm: Normal rate and regular rhythm.     Heart sounds: Normal heart sounds. No murmur.  Pulmonary:     Effort: Pulmonary effort is normal. No respiratory distress.     Breath sounds: Normal breath sounds.  Chest:     Chest wall: Tenderness present.  Abdominal:     General: There is no distension.     Palpations: Abdomen is soft.     Comments: Surgical wound with  surrounding erythema which is tender to palpation. Does have some dehiscence with purulent drainage.  Skin:    General: Skin is warm and dry.  Neurological:     Mental Status: She is alert and oriented to person, place, and time.      ED Treatments / Results  Labs (all labs ordered are listed, but only abnormal results are displayed) Labs Reviewed  CBC WITH DIFFERENTIAL/PLATELET - Abnormal; Notable for the following components:      Result Value   RBC 2.70 (*)    Hemoglobin 8.8 (*)    HCT 27.6 (*)    MCV 102.2 (*)    Abs Immature Granulocytes 0.08 (*)    All other components within normal limits  COMPREHENSIVE METABOLIC PANEL - Abnormal; Notable for the following components:   BUN 24 (*)    Albumin 2.4 (*)    All other components within normal limits  TROPONIN I (HIGH SENSITIVITY)  TROPONIN I (HIGH SENSITIVITY)    EKG EKG Interpretation  Date/Time:  Saturday October 24 2018 09:56:54 EDT Ventricular Rate:  71 PR Interval:    QRS Duration: 87 QT Interval:  438 QTC Calculation: 476 R Axis:   61 Text Interpretation:  Sinus rhythm Confirmed by Virgel Manifold [phone]) on 10/24/2018 10:05:17 AM Also confirmed by Virgel Manifold [phone]), editor Philomena Doheny [phone])  on 10/24/2018 1:45:00 PM   Radiology Ct Angio Chest Pe W And/or Wo Contrast  Result Date: 10/24/2018 CLINICAL DATA:  Central chest pain for 3 weeks post total gastrectomy on 10/02/2018 EXAM: CT ANGIOGRAPHY CHEST CT ABDOMEN AND PELVIS WITH CONTRAST TECHNIQUE: Multidetector CT imaging of the chest was performed using the standard protocol during bolus administration of intravenous contrast. Multiplanar CT image reconstructions and MIPs were obtained to evaluate the vascular anatomy. Multidetector CT imaging of the abdomen and pelvis was performed using the standard protocol during bolus administration of intravenous contrast. CONTRAST:  124mL OMNIPAQUE IOHEXOL 350 MG/ML SOLN COMPARISON:  June 09, 2018, CT of the abdomen.  FINDINGS: CTA CHEST FINDINGS Cardiovascular: Satisfactory opacification of the pulmonary arteries to the segmental level. No evidence of pulmonary embolism. Normal heart size. No pericardial effusion.  Mediastinum/Nodes: No mediastinal lymphadenopathy. The esophagus is not dilated, but there is debris within the esophagus. Lungs/Pleura: Mild right anterior pleural thickening, possibly post radiation changes. Minimal bibasilar atelectasis. Musculoskeletal: Findings of right mastectomy/reconstruction. No acute or significant osseous findings. Review of the MIP images confirms the above findings. CT ABDOMEN and PELVIS FINDINGS Hepatobiliary: No focal liver abnormality is seen. No gallstones, gallbladder wall thickening, or biliary dilatation. Pancreas: Unremarkable. No pancreatic ductal dilatation or surrounding inflammatory changes. Spleen: Normal in size without focal abnormality. Adrenals/Urinary Tract: Adrenal glands are unremarkable. Kidneys are normal, without renal calculi, focal lesion, or hydronephrosis. Bladder is unremarkable. Stomach/Bowel: Post total gastrectomy. Anastomotic changes at the native location of the GE junction. No evidence of small-bowel obstruction, dilation or wall thickening. Feeding tube terminates in the proximal jejunum. The colon has normal appearance with contrast opacifying the entire length of the colon. Vascular/Lymphatic: Aortic atherosclerosis. No enlarged abdominal or pelvic lymph nodes. Shotty retroperitoneal lymph nodes in the upper abdomen. Reproductive: Status post hysterectomy. No adnexal masses. Other: No abdominal wall hernia or abnormality. No abdominopelvic ascites. Expected postsurgical changes along the anterior midline chest/abdominal wall. Musculoskeletal: No acute or significant osseous findings. Review of the MIP images confirms the above findings. IMPRESSION: 1. No evidence of pulmonary embolus. 2. Post total gastrectomy. Patent anastomosis at the native location  of the GE junction. 3. No evidence of small-bowel obstruction. 4. Small amount of debris within the esophagus.  Query reflux. Aortic Atherosclerosis (ICD10-I70.0). Electronically Signed   By: Fidela Salisbury M.D.   On: 10/24/2018 15:00   Ct Abdomen Pelvis W Contrast  Result Date: 10/24/2018 CLINICAL DATA:  Central chest pain for 3 weeks post total gastrectomy on 10/02/2018 EXAM: CT ANGIOGRAPHY CHEST CT ABDOMEN AND PELVIS WITH CONTRAST TECHNIQUE: Multidetector CT imaging of the chest was performed using the standard protocol during bolus administration of intravenous contrast. Multiplanar CT image reconstructions and MIPs were obtained to evaluate the vascular anatomy. Multidetector CT imaging of the abdomen and pelvis was performed using the standard protocol during bolus administration of intravenous contrast. CONTRAST:  158mL OMNIPAQUE IOHEXOL 350 MG/ML SOLN COMPARISON:  June 09, 2018, CT of the abdomen. FINDINGS: CTA CHEST FINDINGS Cardiovascular: Satisfactory opacification of the pulmonary arteries to the segmental level. No evidence of pulmonary embolism. Normal heart size. No pericardial effusion. Mediastinum/Nodes: No mediastinal lymphadenopathy. The esophagus is not dilated, but there is debris within the esophagus. Lungs/Pleura: Mild right anterior pleural thickening, possibly post radiation changes. Minimal bibasilar atelectasis. Musculoskeletal: Findings of right mastectomy/reconstruction. No acute or significant osseous findings. Review of the MIP images confirms the above findings. CT ABDOMEN and PELVIS FINDINGS Hepatobiliary: No focal liver abnormality is seen. No gallstones, gallbladder wall thickening, or biliary dilatation. Pancreas: Unremarkable. No pancreatic ductal dilatation or surrounding inflammatory changes. Spleen: Normal in size without focal abnormality. Adrenals/Urinary Tract: Adrenal glands are unremarkable. Kidneys are normal, without renal calculi, focal lesion, or  hydronephrosis. Bladder is unremarkable. Stomach/Bowel: Post total gastrectomy. Anastomotic changes at the native location of the GE junction. No evidence of small-bowel obstruction, dilation or wall thickening. Feeding tube terminates in the proximal jejunum. The colon has normal appearance with contrast opacifying the entire length of the colon. Vascular/Lymphatic: Aortic atherosclerosis. No enlarged abdominal or pelvic lymph nodes. Shotty retroperitoneal lymph nodes in the upper abdomen. Reproductive: Status post hysterectomy. No adnexal masses. Other: No abdominal wall hernia or abnormality. No abdominopelvic ascites. Expected postsurgical changes along the anterior midline chest/abdominal wall. Musculoskeletal: No acute or significant osseous findings. Review of the MIP  images confirms the above findings. IMPRESSION: 1. No evidence of pulmonary embolus. 2. Post total gastrectomy. Patent anastomosis at the native location of the GE junction. 3. No evidence of small-bowel obstruction. 4. Small amount of debris within the esophagus.  Query reflux. Aortic Atherosclerosis (ICD10-I70.0). Electronically Signed   By: Fidela Salisbury M.D.   On: 10/24/2018 15:00    Procedures Procedures (including critical care time)  Medications Ordered in ED Medications  heparin lock flush 100 unit/mL (has no administration in time range)  ondansetron (ZOFRAN) injection 4 mg (4 mg Intravenous Given 10/24/18 1109)  morphine 4 MG/ML injection 4 mg (4 mg Intravenous Given 10/24/18 1109)  sodium chloride 0.9 % bolus 1,000 mL (0 mLs Intravenous Stopped 10/24/18 1306)  iohexol (OMNIPAQUE) 350 MG/ML injection 100 mL (100 mLs Intravenous Contrast Given 10/24/18 1420)     Initial Impression / Assessment and Plan / ED Course  I have reviewed the triage vital signs and the nursing notes.  Pertinent labs & imaging results that were available during my care of the patient were reviewed by me and considered in my medical decision  making (see chart for details).       Mariah Kemp is a 58 y.o. female who presents to ED for persistent, constant chest pain x 1 week. Of note, patient is s/p partial gastrectomy on July 18th. Does have some erythema and drainage from wound concerning for infection.    10:52 AM - Discussed with Claiborne Billings PA of general surgery who recommends obtaining CT chest/abd/pelvis and surgery will evaluate patient.   CT scans reassuring. No signs of post-op abscess or PE. EKG non-ischemic.Trop x2 normal. Doubt cardiac etiology for chest pain. Discussed with Dr. Dema Severin of general surgery. Will treat with Keflex oral in PEG and have her follow up with surgery clinic. Discussed with patient. Return precautions discussed as well. All questions answered.   Patient discussed with Dr. Wilson Singer who agrees with treatment plan.    Final Clinical Impressions(s) / ED Diagnoses   Final diagnoses:  Cellulitis of abdominal wall    ED Discharge Orders         Ordered    cephALEXin (KEFLEX) 250 MG/5ML suspension  4 times daily     10/24/18 1543           Roshard Rezabek, Ozella Almond, PA-C 10/24/18 1548    Virgel Manifold, MD 10/28/18 1457

## 2018-10-24 NOTE — ED Triage Notes (Signed)
Patient reports chest pain X 3 weeks post op. Central chest pain, not radiating. Patient states she does not have a stomach and so does not believe it is related to her stomach-she describes the pain as when you eat and it "feels stuck."

## 2018-11-04 ENCOUNTER — Inpatient Hospital Stay: Payer: BLUE CROSS/BLUE SHIELD | Admitting: Hematology

## 2018-11-04 ENCOUNTER — Inpatient Hospital Stay: Payer: BLUE CROSS/BLUE SHIELD

## 2018-11-04 ENCOUNTER — Inpatient Hospital Stay: Payer: BLUE CROSS/BLUE SHIELD | Admitting: Nutrition

## 2018-11-11 DIAGNOSIS — C162 Malignant neoplasm of body of stomach: Secondary | ICD-10-CM | POA: Diagnosis not present

## 2018-11-12 DIAGNOSIS — C162 Malignant neoplasm of body of stomach: Secondary | ICD-10-CM | POA: Diagnosis not present

## 2018-11-16 NOTE — Progress Notes (Signed)
[place]   [phone] Fax:([phone]   Clinic Follow up Note   Patient Care Team: Emeterio Reeve, DO as PCP - General (Osteopathic Medicine)  Date of Service:  11/20/2018  CHIEF COMPLAINT: f/u of Gastric Cancer  SUMMARY OF ONCOLOGIC HISTORY: Oncology History Overview Note  Cancer Staging Gastric cancer [facility]) Staging form: Stomach, AJCC 8th Edition - Clinical stage from 03/29/2018: Stage IVA (cT4b, cN1, cM0) - Signed by [name], MD on 04/03/2018     Gastric cancer ([place])  03/19/2018 Procedure   Upper Endoscopy by Dr. Michail Sermon 03/29/18  IMPRESSION - Normal esophagus. - Z-line regular, 38 cm from the incisors. - Non-obstructing oozing gastric ulcer with pigmented material. Biopsied. - Normal examined duodenum. - Acute gastritis.   03/29/2018 Procedure   Colonoscopy by Dr. Michail Sermon 03/29/18 IMPRESSION - Preparation of the colon was fair. - Internal hemorrhoids. - The examined portion of the ileum was normal. - No specimens collected.   03/29/2018 Initial Biopsy   Diagnosis 03/29/18 Stomach, biopsy, Proximal - ADENOCARCINOMA. - GOBLET CELL METAPLASIA. - SEE COMMENT. Microscopic Comment A Warthin Starry stain is negative for the presence of Helicobacter pylori organisms. Dr Vic Ripper has reviewed the case and concurs with this interpretation. Dr Michail Sermon was paged on 03/31/2018. (JBK:ecj 03/31/2018)   03/29/2018 Cancer Staging   Staging form: Stomach, AJCC 8th Edition - Clinical stage from 03/29/2018: Stage IVA (cT4b, cN1, cM0) - Signed by [name], MD on 04/03/2018   04/02/2018 Initial Diagnosis   Gastric cancer ([place])   04/02/2018 Imaging   CT CAP W Contrast 04/02/18 IMPRESSION: 1. Focal thickening lateral wall proximal stomach with protrusion of low-attenuation soft tissue beyond the expected confines of the gastric wall into the splenic hilum. Imaging features highly concerning for transmural tumor extension into the splenic hilum.  Several small nodules in this region are likely lymph nodes, concerning for metastatic disease. 2. Scattered bilateral pulmonary nodules measuring up to 5 mm. Nonspecific, but close attention on follow-up recommended as metastatic disease not excluded. 3. Small lymph nodes in the gastrohepatic ligament, but there is no gastrohepatic or hepato duodenal ligament lymphadenopathy. No evidence for liver metastases. 4.  Aortic Atherosclerois (ICD10-170.0   04/15/2018 - 07/22/2018 Chemotherapy   FLOT4 every 2 weeks starting 04/15/18. She completed 8 cycles on 07/22/18.   08/21/2018 Imaging   CT CAP W Contrast  IMPRESSION: 1. No evidence of residual gastric mass. No abdominopelvic adenopathy. 2. Similar bilateral pulmonary nodules, indeterminate. 3. Development of upper lung and peribronchovascular predominant pulmonary opacities. Differential considerations include drug toxicity or atypical infection. Lymphangitic tumor spread felt less likely but cannot be excluded. Consider chest CT follow-up at 3-6 months. 4.  Aortic Atherosclerosis (ICD10-I70.0).    10/02/2018 Surgery   LAPAROSCOPY DIAGNOSTIC, TOTAL GASTRECTOMY, FEEDING TUBE PLACEMENT by Dr. 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 - ADENOCARCINOMA IS NOT IDENTIFIED. - THERE IS NO EVIDENCE OF CARCINOMA IN 34 OF 34 LYMPH NODES (0/34). - SEE ONCOLOGY TABLE BELOW.      CURRENT THERAPY:  Surveillance   INTERVAL HISTORY:  Mariah Kemp is here for a follow up post surgery. She presents to the clinic alone. She notes she had trouble with Osmolite which caused her acid reflux and nausea. She changed her food and was off feeding for 1 week and lost weight from that. She since then has been able to gain 2 pounds.  She has been tube feeding 61ml an hour for 18 hours mostly over night and trying to eat orally more.  She notes she has trouble getting liquids down. She notes she is able to eat orally well. 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    REVIEW OF SYSTEMS:   Constitutional: Denies fevers, chills (+) weight loss Eyes: Denies blurriness of vision Ears, nose, mouth, throat, and face: Denies mucositis or sore throat Respiratory: Denies cough, dyspnea or wheezes Cardiovascular: Denies palpitation, chest discomfort or lower extremity swelling Gastrointestinal:  Denies nausea, heartburn or change in bowel habits Skin: Denies abnormal skin rashes Lymphatics: Denies new lymphadenopathy or easy bruising Neurological: (+) Mild residual neuropathy in her fingers  Behavioral/Psych: Mood is stable, no new changes  All other systems were reviewed with the patient and are negative.  MEDICAL HISTORY:  Past Medical History:  Diagnosis Date  . Asthma   . Cancer ([place])   . Dyspnea    over exertion  . Hypertension   . Hypothyroidism   . Thyroid disease     SURGICAL HISTORY: Past Surgical History:  Procedure Laterality Date  . ABDOMINAL HYSTERECTOMY  1996  . BILATERAL SALPINGOOPHORECTOMY  2007  . BIOPSY  03/29/2018   Procedure: BIOPSY;  Surgeon: Wilford Corner, MD;  Location: [place];  Service: Endoscopy;;  . CARPAL TUNNEL RELEASE    . COLONOSCOPY N/A 03/29/2018   Procedure: COLONOSCOPY;  Surgeon: Wilford Corner, MD;  Location: [facility] ENDOSCOPY;  Service: Endoscopy;  Laterality: N/A;  . ESOPHAGOGASTRODUODENOSCOPY N/A 03/29/2018   Procedure: ESOPHAGOGASTRODUODENOSCOPY (EGD);  Surgeon: Wilford Corner, MD;  Location: [place];  Service: Endoscopy;  Laterality: N/A;  . GASTRECTOMY N/A 10/02/2018   Procedure: TOTAL GASTRECTOMY;  Surgeon: [name], MD;  Location: [place];  Service: General;  Laterality: N/A;  . GASTROSTOMY N/A 10/02/2018   Procedure: FEEDING TUBE PLACEMENT;  Surgeon: [name], MD;  Location: [place];  Service: General;   Laterality: N/A;  . LAPAROSCOPY N/A 04/13/2018   Procedure: LAPAROSCOPY DIAGNOSTIC;  Surgeon: [name], MD;  Location: [facility];  Service: General;  Laterality: N/A;  . LAPAROSCOPY N/A 10/02/2018   Procedure: LAPAROSCOPY DIAGNOSTIC;  Surgeon: [name], MD;  Location: [place];  Service: General;  Laterality: N/A;  . MASTECTOMY  2007  . PORTACATH PLACEMENT Left 04/13/2018   Procedure: INSERTION PORT-A-CATH ERAS PATHWAY;  Surgeon: [name], MD;  Location: [facility];  Service: General;  Laterality: Left;  subclavian    I have reviewed the social history and family history with the patient and they are unchanged from previous note.  ALLERGIES:  has No Known Allergies.  MEDICATIONS:  Current Outpatient Medications  Medication Sig Dispense Refill  . acyclovir (ZOVIRAX) 400 MG tablet Take 1 tablet (400 mg total) by mouth 3 (three) times daily. For five days. As needed for cold sore. (Patient taking differently: Take 400 mg by mouth 3 (three) times daily as needed (cold sores.). ) 15 tablet 0  . albuterol (PROVENTIL HFA;VENTOLIN HFA) 108 (90 Base) MCG/ACT inhaler Inhale 1-2 puffs into the lungs every 4 (four) hours as needed for wheezing or shortness of breath. 2 Inhaler 3  . baclofen (LIORESAL) 10 MG tablet Take 1 tablet (10 mg total) by mouth 4 (four) times daily as needed for muscle spasms. 30 each 0  . calcium carbonate (TUMS - DOSED IN MG ELEMENTAL CALCIUM) 500 MG chewable tablet Chew 1 tablet (200 mg of elemental calcium total) by mouth 3 (  three) times daily. 90 tablet 11  . levothyroxine (SYNTHROID) 150 MCG tablet TAKE 1 TABLET BY MOUTH EVERY DAY BEFORE BREAKFAST 30 tablet 0  . lidocaine-prilocaine (EMLA) cream Apply 1 application topically daily as needed (prior to port being accessed).     Marland Kitchen loperamide (IMODIUM) 2 MG capsule Take 1 capsule (2 mg total) by mouth 2 (two) times daily as needed for diarrhea or loose stools. 30 capsule 0  . Nutritional Supplements (FEEDING SUPPLEMENT,  OSMOLITE 1.2 CAL,) LIQD Place 1,000 mLs into feeding tube continuous. (Patient not taking: Reported on 11/20/2018) 43200 mL 2  . vitamin B-12 1000 MCG tablet Take 1 tablet (1,000 mcg total) by mouth daily. 30 tablet 0   No current facility-administered medications for this visit.     PHYSICAL EXAMINATION: ECOG PERFORMANCE STATUS: 1 - Symptomatic but completely ambulatory  Vitals:   11/20/18 1127  BP: 112/80  Pulse: 83  Resp: 18  Temp: 98.9 F (37.2 C)  SpO2: 100%   There were no vitals filed for this visit. GENERAL:alert, no distress and comfortable SKIN: skin color, texture, turgor are normal, no rashes or significant lesions EYES: normal, Conjunctiva are pink and non-injected, sclera clear NECK: supple, thyroid normal size, non-tender, without nodularity LYMPH:  no palpable lymphadenopathy in the cervical, axillary  LUNGS: clear to auscultation and percussion with normal breathing effort HEART: regular rate & rhythm and no murmurs and no lower extremity edema ABDOMEN:abdomen soft, non-tender and normal bowel sounds (+) Mid line surgical incision healing well with slight open wound in the midle, (+) J-tube at left side  Musculoskeletal:no cyanosis of digits and no clubbing  NEURO: alert & oriented x 3 with fluent speech, no focal motor/sensory deficits  LABORATORY DATA:  I have reviewed the data as listed CBC Latest Ref Rng & Units 11/20/2018 10/24/2018 10/07/2018  WBC 4.0 - 10.5 K/uL 6.1 8.2 4.7  Hemoglobin 12.0 - 15.0 g/dL 10.0(L) 8.8(L) 8.2(L)  Hematocrit 36.0 - 46.0 % 29.4(L) 27.6(L) 22.9(L)  Platelets 150 - 400 K/uL 208 366 152     CMP Latest Ref Rng & Units 11/20/2018 10/24/2018 10/07/2018  Glucose 70 - 99 mg/dL 78 97 107(H)  BUN 6 - 20 mg/dL 17 24(H) 6  Creatinine 0.44 - 1.00 mg/dL 0.55 0.61 0.65  Sodium 135 - 145 mmol/L 138 136 138  Potassium 3.5 - 5.1 mmol/L 3.8 4.8 3.6  Chloride 98 - 111 mmol/L 104 99 107  CO2 22 - 32 mmol/L 26 26 23   Calcium 8.9 - 10.3 mg/dL 9.0 9.0  8.8(L)  Total Protein 6.5 - 8.1 g/dL 7.9 8.1 -  Total Bilirubin 0.3 - 1.2 mg/dL 0.2(L) 0.4 -  Alkaline Phos 38 - 126 U/L 80 112 -  AST 15 - 41 U/L 20 26 -  ALT 0 - 44 U/L 15 38 -      RADIOGRAPHIC STUDIES: I have personally reviewed the radiological images as listed and agreed with the findings in the report. No results found.   ASSESSMENT & PLAN:  Mariah Kemp is a 58 y.o. female with   1. 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 -Given her locally advanced disease, she completed 8 cycles of neo-adjuvant chemoFLOT4.  -She underwent total Gastrectomy and lymph node dissection and had feeding tube placed on 10/02/18. We discussed her pathology which showed complete complete pathological response with all nodes negative.  -Given her pCR, I do not recommend more adjuvant radiation or chemotherapy. Will proceed  with surveillance.  -I discussed she still has risk of recurrence. Will monitor with CT scan every 6 months for the first year then yearly. Will do regular labs, exam and f/u every 4-6 months for the first 2-3 years then yearly for a total of 5 years.  -I recommend she keep her PAC in place for at least a year given her risk of recurrence. Will continue flushes every 8 weeks.  -She still has mild residual tingling of her fingertips from chemo. She continues to recover well from surgery with her incision still healing. She is still tube feeding and trying to eat orally more. I strongly encouraged to work on gaining weight and muscle.  -Labs reviewed, CBC and CMP WNL except hg 10.  -I discussed her option of survivorship clinic. She is interested, will proceed in 2 months  -F/u in 4 months with CT CAP  2. Weight loss, Mild to moderate Malnutrition, Acid reflux  -With surgery she has feeding tube in place on 10/02/18 -She has been tube feeding and currently on 2ml/hour for 18 hours a day. She switched  from Osmolite due to acid reflux and nausea.  -She has been trying to eat more orally. She notes she is not getting enough liquids down. I recommend she flush her tube more often. She is agreeable.  -I recommend she f/u with Warnell Forester and exercise more to try to gain more weight and muscle.   3. Anemia, iron deficient from chronic GI bleeding. -Secondary to GI blood loss  -Given 3u blood transfusion on 04/01/18, responded moderately well.  -She is s/p 2 doses of IV iron in 03/2018, she responded moderately wellwith improved anemia. -Hemoglobin10 today(11/20/18), Ferritin still pending.Will continue monitoring.   4. H/o stage 3 right breast cancer in 2007 -Treated with right mastectomy, reconstruction with 3 LN removed. She underwent chemotherapy for 5 months, likely AC-T. She completed Tamoxifen for at least 5 years.   5. Genetic  -I discussed her eligibility for genetic testing. She has personal history of breast and gastric cancer. She waspreviouslyreferred. She has not been seen yet, due to her concerns of insurance coverage  -due to the high deductible for her cancer care, she is not able to afford the genetic test out of pocket   6. HTN andhypothyroidism -We will hold hydrochlorothiazide for now during the chemo -HTN very well controlled -Continue to follow-up with PCP   PLAN: -Lab, flush and survivorship clinic with NP Lacie in 2 months  -F/u in 4 months with lab, flush and CT CAP W Contrast a few days before.   No problem-specific Assessment & Plan notes found for this encounter.   No orders of the defined types were placed in this encounter.  All questions were answered. The patient knows to call the clinic with any problems, questions or concerns. No barriers to learning was detected. I spent 20 minutes counseling the patient face to face. The total time spent in the appointment was 25 minutes and more than 50% was on counseling and review of test results      [name], MD 11/20/2018   I, Joslyn Devon, am acting as scribe for [name], MD.   I have reviewed the above documentation for accuracy and completeness, and I agree with the above.

## 2018-11-20 ENCOUNTER — Inpatient Hospital Stay: Payer: BC Managed Care – PPO

## 2018-11-20 ENCOUNTER — Encounter: Payer: Self-pay | Admitting: Hematology

## 2018-11-20 ENCOUNTER — Other Ambulatory Visit: Payer: Self-pay

## 2018-11-20 ENCOUNTER — Inpatient Hospital Stay: Payer: BC Managed Care – PPO | Attending: Hematology

## 2018-11-20 ENCOUNTER — Inpatient Hospital Stay (HOSPITAL_BASED_OUTPATIENT_CLINIC_OR_DEPARTMENT_OTHER): Payer: BC Managed Care – PPO | Admitting: Hematology

## 2018-11-20 VITALS — BP 112/80 | HR 83 | Temp 98.9°F | Resp 18 | Ht 61.0 in

## 2018-11-20 DIAGNOSIS — C162 Malignant neoplasm of body of stomach: Secondary | ICD-10-CM | POA: Diagnosis not present

## 2018-11-20 DIAGNOSIS — R634 Abnormal weight loss: Secondary | ICD-10-CM | POA: Diagnosis not present

## 2018-11-20 DIAGNOSIS — Z853 Personal history of malignant neoplasm of breast: Secondary | ICD-10-CM | POA: Insufficient documentation

## 2018-11-20 DIAGNOSIS — G629 Polyneuropathy, unspecified: Secondary | ICD-10-CM | POA: Insufficient documentation

## 2018-11-20 DIAGNOSIS — E441 Mild protein-calorie malnutrition: Secondary | ICD-10-CM | POA: Insufficient documentation

## 2018-11-20 DIAGNOSIS — D500 Iron deficiency anemia secondary to blood loss (chronic): Secondary | ICD-10-CM

## 2018-11-20 DIAGNOSIS — I10 Essential (primary) hypertension: Secondary | ICD-10-CM

## 2018-11-20 DIAGNOSIS — Z95828 Presence of other vascular implants and grafts: Secondary | ICD-10-CM

## 2018-11-20 DIAGNOSIS — K219 Gastro-esophageal reflux disease without esophagitis: Secondary | ICD-10-CM | POA: Diagnosis not present

## 2018-11-20 DIAGNOSIS — C169 Malignant neoplasm of stomach, unspecified: Secondary | ICD-10-CM

## 2018-11-20 LAB — CMP (CANCER CENTER ONLY)
ALT: 15 U/L (ref 0–44)
AST: 20 U/L (ref 15–41)
Albumin: 3.7 g/dL (ref 3.5–5.0)
Alkaline Phosphatase: 80 U/L (ref 38–126)
Anion gap: 8 (ref 5–15)
BUN: 17 mg/dL (ref 6–20)
CO2: 26 mmol/L (ref 22–32)
Calcium: 9 mg/dL (ref 8.9–10.3)
Chloride: 104 mmol/L (ref 98–111)
Creatinine: 0.55 mg/dL (ref 0.44–1.00)
GFR, Est AFR Am: >60 mL/min (ref >60)
GFR, Estimated: >60 mL/min (ref >60)
Glucose, Bld: 78 mg/dL (ref 70–99)
Potassium: 3.8 mmol/L (ref 3.5–5.1)
Sodium: 138 mmol/L (ref 135–145)
Total Bilirubin: 0.2 mg/dL — ABNORMAL LOW (ref 0.3–1.2)
Total Protein: 7.9 g/dL (ref 6.5–8.1)

## 2018-11-20 LAB — CBC WITH DIFFERENTIAL (CANCER CENTER ONLY)
Abs Immature Granulocytes: 0.01 10*3/uL (ref 0.00–0.07)
Basophils Absolute: 0 10*3/uL (ref 0.0–0.1)
Basophils Relative: 1 %
Eosinophils Absolute: 0.3 10*3/uL (ref 0.0–0.5)
Eosinophils Relative: 5 %
HCT: 29.4 % — ABNORMAL LOW (ref 36.0–46.0)
Hemoglobin: 10 g/dL — ABNORMAL LOW (ref 12.0–15.0)
Immature Granulocytes: 0 %
Lymphocytes Relative: 32 %
Lymphs Abs: 1.9 10*3/uL (ref 0.7–4.0)
MCH: 33.4 pg (ref 26.0–34.0)
MCHC: 34 g/dL (ref 30.0–36.0)
MCV: 98.3 fL (ref 80.0–100.0)
Monocytes Absolute: 0.4 10*3/uL (ref 0.1–1.0)
Monocytes Relative: 6 %
Neutro Abs: 3.5 10*3/uL (ref 1.7–7.7)
Neutrophils Relative %: 56 %
Platelet Count: 208 10*3/uL (ref 150–400)
RBC: 2.99 MIL/uL — ABNORMAL LOW (ref 3.87–5.11)
RDW: 14.9 % (ref 11.5–15.5)
WBC Count: 6.1 10*3/uL (ref 4.0–10.5)
nRBC: 0 % (ref 0.0–0.2)

## 2018-11-20 LAB — SAMPLE TO BLOOD BANK

## 2018-11-20 LAB — FERRITIN: Ferritin: 673 ng/mL — ABNORMAL HIGH (ref 11–307)

## 2018-11-20 MED ORDER — HEPARIN SOD (PORK) LOCK FLUSH 100 UNIT/ML IV SOLN
500.0000 [IU] | Freq: Once | INTRAVENOUS | Status: AC
  Administered 2018-11-20: 11:00:00 500 [IU]
  Filled 2018-11-20: qty 5

## 2018-11-20 MED ORDER — SODIUM CHLORIDE 0.9% FLUSH
10.0000 mL | Freq: Once | INTRAVENOUS | Status: AC
  Administered 2018-11-20: 11:00:00 10 mL
  Filled 2018-11-20: qty 10

## 2018-11-21 ENCOUNTER — Encounter: Payer: Self-pay | Admitting: Hematology

## 2018-11-21 LAB — CANCER ANTIGEN 19-9: CA 19-9: 8 U/mL (ref 0–35)

## 2018-11-25 ENCOUNTER — Telehealth: Payer: Self-pay | Admitting: Hematology

## 2018-11-25 NOTE — Telephone Encounter (Signed)
Called and left msg. Mailed printout  °

## 2018-11-29 ENCOUNTER — Other Ambulatory Visit: Payer: Self-pay | Admitting: Osteopathic Medicine

## 2018-12-12 DIAGNOSIS — C162 Malignant neoplasm of body of stomach: Secondary | ICD-10-CM | POA: Diagnosis not present

## 2018-12-15 ENCOUNTER — Ambulatory Visit: Payer: BC Managed Care – PPO | Admitting: Nutrition

## 2018-12-15 NOTE — Progress Notes (Signed)
RD working remotely.  Nutrition follow up completed with patient over the phone. Patient is s/p total gastrectomy for gastric cancer. Reports she is tolerating Vital AF at 60 mL/hr over 12 hours delivering about 853 kcal, 53 gm protein. She is running continuous TF via Jejunostomy pump for 12 hours overnight. Reports weight improved to ~135 pounds on her home scale. She has a lot of gas and heartburn but stopped taking the medications prescribed. She tolerates meats if chewed well and she eats slowly. She prefers room temperature liquids.  Drinking some smoothies. Really wants to eat a salad but is afraid of the fiber.  Nutrition Diagnosis: Food and Nutrition Related Knowledge Deficit improved.  Intervention: Educated patient to eat every 3 hours at ~9 am, 12 noon, 3 pm and 6 pm. She will continue nocturnal TF from 8:00 pm to 8:00 am using Vital AF at 60 mL/hr. Educated to consume small amounts of food at each mini meal and gradually increase vegetables and fruits as tolerated. Chew well. Recommended she resume medications prescribed for gas and heartburn. Educated her on basic digestion and absorption without her stomach.  Vitamin & Mineral Recommendations  Patients are at high risk for deficiencies of protein, folate, vitamin B-12, iron, and calcium. Long-term effects of surgery may be other micronutrient deficiencies.  Vitamin/Calcium/B12 supplementation recommendations:   . 2 Chewable Multivitamin supplements with iron (ex. Centrum Chewable for adults)  . Chewable Calcium Citrate with Vitamin D3, 1500 mg per day  . Sublingual B12, 350-500 mcg per day. Patients may opt for Nascobal (nose spray dose once a week) or B12 shots (MD approved).    2 multivitamins daily  1500 mg Calcium Citrate  350-500 mcg sublingual B12  Schedule:  Marland Kitchen Multivitamins with iron should be taken 2 hours prior to calcium supplements to prevent binding.  . Calcium Citrate should be taken in 500 mg increments  3 times day, 2 hours apart.    Monitoring, Evaluation, Goals: Patient will increase calories and protein to promote safe weight gain and increased strength. Will increase oral intake to decrease/Discontinue TF.  Next Visit:Telephone f/u Tuesday, Oct 27.

## 2018-12-25 ENCOUNTER — Other Ambulatory Visit: Payer: Self-pay

## 2018-12-25 ENCOUNTER — Encounter: Payer: Self-pay | Admitting: Osteopathic Medicine

## 2018-12-25 ENCOUNTER — Ambulatory Visit (INDEPENDENT_AMBULATORY_CARE_PROVIDER_SITE_OTHER): Payer: BC Managed Care – PPO | Admitting: Osteopathic Medicine

## 2018-12-25 VITALS — BP 115/76 | HR 70 | Temp 97.7°F | Wt 135.0 lb

## 2018-12-25 DIAGNOSIS — Z13220 Encounter for screening for lipoid disorders: Secondary | ICD-10-CM | POA: Diagnosis not present

## 2018-12-25 DIAGNOSIS — Z0000 Encounter for general adult medical examination without abnormal findings: Secondary | ICD-10-CM | POA: Diagnosis not present

## 2018-12-25 DIAGNOSIS — Z1231 Encounter for screening mammogram for malignant neoplasm of breast: Secondary | ICD-10-CM

## 2018-12-25 DIAGNOSIS — C169 Malignant neoplasm of stomach, unspecified: Secondary | ICD-10-CM | POA: Diagnosis not present

## 2018-12-25 DIAGNOSIS — E039 Hypothyroidism, unspecified: Secondary | ICD-10-CM

## 2018-12-25 NOTE — Patient Instructions (Signed)
General Preventive Care  Most recent routine screening lipids/other labs: ordered today!   BP: goal 130/80 or less!   Tobacco: don't!   Alcohol: responsible moderation is ok for most adults - if you have concerns about your alcohol intake, please talk to me!   Exercise: as tolerated to reduce risk of cardiovascular disease and diabetes. Strength training will also prevent osteoporosis.   Mental health: if need for mental health care (medicines, counseling, other), or concerns about moods, please let me know!   Sexual health: if need for STD testing, or if concerns with libido/pain problems, please let me know!   Advanced Directive: Living Will and/or Healthcare Power of Attorney recommended for all adults, regardless of age or health.  Vaccines  Flu vaccine: recommended for almost everyone, every fall.   Shingles vaccine: Shingrix recommended after age 17.   Pneumonia vaccines: Prevnar and Pneumovax recommended after age 90, or sooner if certain medical conditions.  Tetanus booster: Tdap recommended every 10 years.  Cancer screenings   Colon cancer screening: per GI recommendations   Breast cancer screening: mammogram recommended annually after age 39.   Cervical cancer screening: Can stop Pap at age 20 or w/ hysterectomy.   Lung cancer screening: not needed if stopped smoking more than 15 years ago.  Infection screenings . HIV: recommended screening at least once age 66-65, more often as needed. . Gonorrhea/Chlamydia: screening as needed.  . Hepatitis C: recommended once for anyone born 59-1964 . TB: certain at-risk populations, or depending on work requirements and/or travel history Other . Bone Density Test: recommended for women at age 53

## 2018-12-25 NOTE — Progress Notes (Signed)
HPI: Mariah Kemp is a 58 y.o. female who  has a past medical history of Asthma, Cancer ([place]), Dyspnea, Hypertension, Hypothyroidism, and Thyroid disease.  she presents to [facility] today, 12/25/18,  for chief complaint of: Annual Follow-up thyroid   Patient has had quite a year since she last saw me!  Diagnosed with stomach cancer, she is currently in remission and following with surgery and oncology.  Reports overall doing well, has gained back a bit of weight and is happy with current state of health.      Past medical, surgical, social and family history reviewed:  Patient Active Problem List   Diagnosis Date Noted  . S/P gastrectomy 10/02/2018  . 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 Port-A-Cath in place 05/13/2018  . Iron deficiency anemia due to chronic blood loss 04/03/2018  . Gastric cancer ([place]) 04/02/2018  . Acute upper GI bleed 03/31/2018  . Acute gastric ulcer without hemorrhage, perforation or obstruction 03/31/2018  . Symptomatic anemia 03/31/2018  . B12 deficiency 03/31/2018  . Gastrointestinal hemorrhage   . Palpitations 03/27/2018  . Moderate intermittent asthma without complication [id number]  . Mixed hyperlipidemia 09/24/2016  . Elevated fasting glucose 09/24/2016  . Cold sore 04/02/2016  . Nasal polyposis 09/07/2014  . Hypertriglyceridemia 08/24/2014  . Hypothyroidism 08/24/2014  . History of breast cancer 08/19/2014  . Chronic bronchitis ([place]) 05/31/2014  . Essential hypertension 05/31/2014    Past Surgical History:  Procedure Laterality Date  . ABDOMINAL HYSTERECTOMY  1996  . BILATERAL SALPINGOOPHORECTOMY  2007  . BIOPSY  03/29/2018   Procedure: BIOPSY;  Surgeon: Wilford Corner, MD;  Location: [place];  Service: Endoscopy;;  . CARPAL TUNNEL RELEASE    . COLONOSCOPY N/A 03/29/2018   Procedure: COLONOSCOPY;  Surgeon: Wilford Corner, MD;  Location: [facility] ENDOSCOPY;  Service: Endoscopy;   Laterality: N/A;  . ESOPHAGOGASTRODUODENOSCOPY N/A 03/29/2018   Procedure: ESOPHAGOGASTRODUODENOSCOPY (EGD);  Surgeon: Wilford Corner, MD;  Location: [place];  Service: Endoscopy;  Laterality: N/A;  . GASTRECTOMY N/A 10/02/2018   Procedure: TOTAL GASTRECTOMY;  Surgeon: [name], MD;  Location: [place];  Service: General;  Laterality: N/A;  . GASTROSTOMY N/A 10/02/2018   Procedure: FEEDING TUBE PLACEMENT;  Surgeon: [name], MD;  Location: [place];  Service: General;  Laterality: N/A;  . LAPAROSCOPY N/A 04/13/2018   Procedure: LAPAROSCOPY DIAGNOSTIC;  Surgeon: [name], MD;  Location: [facility];  Service: General;  Laterality: N/A;  . LAPAROSCOPY N/A 10/02/2018   Procedure: LAPAROSCOPY DIAGNOSTIC;  Surgeon: [name], MD;  Location: [place];  Service: General;  Laterality: N/A;  . MASTECTOMY  2007  . PORTACATH PLACEMENT Left 04/13/2018   Procedure: INSERTION PORT-A-CATH ERAS PATHWAY;  Surgeon: [name], MD;  Location: [facility];  Service: General;  Laterality: Left;  subclavian    Social History   Tobacco Use  . Smoking status: Former Smoker    Packs/day: 0.50    Years: 15.00    Pack years: 7.50    Quit date: 04/27/1994    Years since quitting: 24.6  . Smokeless tobacco: Never Used  Substance Use Topics  . Alcohol use: No    Alcohol/week: 0.0 standard drinks    Family History  Problem Relation Age of Onset  . Cancer Father        liver CA  . Cancer Paternal Uncle        unknown type cancer     Current medication list and allergy/intolerance information reviewed:  Current Outpatient Medications  Medication Sig Dispense Refill  . acyclovir (ZOVIRAX) 400 MG tablet Take 1 tablet (400 mg total) by mouth 3 (three) times daily. For five days. As needed for cold sore. (Patient taking differently: Take 400 mg by mouth 3 (three) times daily as needed (cold sores.). ) 15 tablet 0  . albuterol (PROVENTIL HFA;VENTOLIN HFA) 108 (90 Base) MCG/ACT inhaler Inhale 1-2 puffs  into the lungs every 4 (four) hours as needed for wheezing or shortness of breath. 2 Inhaler 3  . baclofen (LIORESAL) 10 MG tablet Take 1 tablet (10 mg total) by mouth 4 (four) times daily as needed for muscle spasms. 30 each 0  . calcium carbonate (TUMS - DOSED IN MG ELEMENTAL CALCIUM) 500 MG chewable tablet Chew 1 tablet (200 mg of elemental calcium total) by mouth 3 (three) times daily. 90 tablet 11  . levothyroxine (SYNTHROID) 150 MCG tablet TAKE 1 TABLET BY MOUTH EVERY DAY BEFORE BREAKFAST 30 tablet 0  . lidocaine-prilocaine (EMLA) cream Apply 1 application topically daily as needed (prior to port being accessed).     Marland Kitchen loperamide (IMODIUM) 2 MG capsule Take 1 capsule (2 mg total) by mouth 2 (two) times daily as needed for diarrhea or loose stools. 30 capsule 0  . Nutritional Supplements (FEEDING SUPPLEMENT, OSMOLITE 1.2 CAL,) LIQD Place 1,000 mLs into feeding tube continuous. 43200 mL 2  . vitamin B-12 1000 MCG tablet Take 1 tablet (1,000 mcg total) by mouth daily. 30 tablet 0   No current facility-administered medications for this visit.     No Known Allergies    Review of Systems:  Constitutional:  No  fever, no chills, +unintentional weight changes (loss w/ cancer but is gaining eight back now). No significant fatigue.   HEENT: No  headache, no vision change, no hearing change, No sore throat, No  sinus pressure  Cardiac: No  chest pain, No  pressure, No palpitations, No  Orthopnea   Respiratory:  No  shortness of breath. No  Cough  Gastrointestinal: +occasional abdominal pain, +nausea, No  vomiting,  No  blood in stool, No  diarrhea, No  constipation   Musculoskeletal: No new myalgia/arthralgia  Skin: No  Rash, No other wounds/concerning lesions  Genitourinary: No  incontinence, No  abnormal genital bleeding, No abnormal genital discharge  Hem/Onc: No  easy bruising/bleeding  Endocrine: No cold intolerance,  No heat intolerance. No polyuria/polydipsia/polyphagia    Neurologic: No  weakness, No  dizziness, No  slurred speech/focal weakness/facial droop  Psychiatric: No  concerns with depression, No  concerns with anxiety, No sleep problems, No mood problems  Exam:  BP 115/76 (BP Location: Right Arm, Patient Position: Sitting, Cuff Size: Normal)   Pulse 70   Temp 97.7 F (36.5 C) (Oral)   Wt 135 lb (61.2 kg)   BMI 25.51 kg/m   Constitutional: VS see above. General Appearance: alert, well-developed, well-nourished, NAD  Neck: No masses, trachea midline. No thyroid enlargement. No tenderness/mass appreciated. No lymphadenopathy  Respiratory: Normal respiratory effort. no wheeze, no rhonchi, no rales  Cardiovascular: S1/S2 normal, no murmur, no rub/gallop auscultated. RRR. No lower extremity edema.   Gastrointestinal: Nontender, no masses. No hepatomegaly, no splenomegaly. No hernia appreciated. Bowel sounds normal. Rectal exam deferred.   Musculoskeletal: Gait normal. No clubbing/cyanosis of digits.   Neurological: Normal balance/coordination. No tremor. No cranial nerve deficit on limited exam. Motor and sensation intact and symmetric. Cerebellar reflexes intact.   Skin: warm, dry, intact. No rash/ulcer. No concerning nevi or subq  nodules on limited exam.    Psychiatric: Normal judgment/insight. Normal mood and affect. Oriented x3.    No results found for this or any previous visit (from the past 72 hour(s)).  No results found.       ASSESSMENT/PLAN: The primary encounter diagnosis was Annual physical exam. Diagnoses of Hypothyroidism, unspecified type, Lipid screening, Malignant neoplasm of stomach, unspecified location [facility]), and Encounter for screening mammogram for malignant neoplasm of breast were also pertinent to this visit.  Declined vaccines today, will think about getting the shingles shot and tetanus shot  Orders Placed This Encounter  Procedures  . MM 3D SCREEN BREAST BILATERAL  . TSH  . Lipid panel  . Vitamin B12      Patient Instructions  General Preventive Care  Most recent routine screening lipids/other labs: ordered today!   BP: goal 130/80 or less!   Tobacco: don't!   Alcohol: responsible moderation is ok for most adults - if you have concerns about your alcohol intake, please talk to me!   Exercise: as tolerated to reduce risk of cardiovascular disease and diabetes. Strength training will also prevent osteoporosis.   Mental health: if need for mental health care (medicines, counseling, other), or concerns about moods, please let me know!   Sexual health: if need for STD testing, or if concerns with libido/pain problems, please let me know!   Advanced Directive: Living Will and/or Healthcare Power of Attorney recommended for all adults, regardless of age or health.  Vaccines  Flu vaccine: recommended for almost everyone, every fall.   Shingles vaccine: Shingrix recommended after age 46.   Pneumonia vaccines: Prevnar and Pneumovax recommended after age 14, or sooner if certain medical conditions.  Tetanus booster: Tdap recommended every 10 years.  Cancer screenings   Colon cancer screening: per GI recommendations   Breast cancer screening: mammogram recommended annually after age 7.   Cervical cancer screening: Can stop Pap at age 4 or w/ hysterectomy.   Lung cancer screening: not needed if stopped smoking more than 15 years ago.  Infection screenings . HIV: recommended screening at least once age 72-65, more often as needed. . Gonorrhea/Chlamydia: screening as needed.  . Hepatitis C: recommended once for anyone born 31-1964 . TB: certain at-risk populations, or depending on work requirements and/or travel history Other . Bone Density Test: recommended for women at age 33        Visit summary with medication list and pertinent instructions was printed for patient to review. All questions at time of visit were answered - patient instructed to contact office with any  additional concerns or updates. ER/RTC precautions were reviewed with the patient.     Please note: voice recognition software was used to produce this document, and typos may escape review. Please contact Dr. Sheppard Coil for any needed clarifications.     Follow-up plan: Return in about 1 year (around 12/25/2019) for [place] (call week prior to visit for lab orders).

## 2018-12-26 LAB — LIPID PANEL
Cholesterol: 240 mg/dL — ABNORMAL HIGH (ref <200)
HDL: 36 mg/dL — ABNORMAL LOW (ref >=50)
LDL Cholesterol (Calc): 168 mg/dL (calc) — ABNORMAL HIGH
Non-HDL Cholesterol (Calc): 204 mg/dL (calc) — ABNORMAL HIGH (ref <130)
Total CHOL/HDL Ratio: 6.7 (calc) — ABNORMAL HIGH (ref <5.0)
Triglycerides: 199 mg/dL — ABNORMAL HIGH (ref <150)

## 2018-12-26 LAB — VITAMIN B12: Vitamin B-12: 407 pg/mL (ref 200–1100)

## 2018-12-26 LAB — TSH: TSH: 87.85 mIU/L — ABNORMAL HIGH (ref 0.40–4.50)

## 2018-12-28 ENCOUNTER — Encounter: Payer: Self-pay | Admitting: Osteopathic Medicine

## 2018-12-28 DIAGNOSIS — E039 Hypothyroidism, unspecified: Secondary | ICD-10-CM

## 2018-12-28 MED ORDER — LEVOTHYROXINE SODIUM 150 MCG PO TABS: 150.0000 ug | ORAL_TABLET | Freq: Every day | ORAL | 0 refills | Status: DC

## 2019-01-11 DIAGNOSIS — C162 Malignant neoplasm of body of stomach: Secondary | ICD-10-CM | POA: Diagnosis not present

## 2019-01-12 ENCOUNTER — Inpatient Hospital Stay: Payer: BC Managed Care – PPO | Attending: Hematology | Admitting: Nutrition

## 2019-01-12 NOTE — Progress Notes (Signed)
RD working remotely.  Nutrition follow up completed with patient over the telephone. Patient reports her J-tube fell out and it was not put back in. She says she is maintaining weight around 135 pounds. She suffers from reflux but understands strategies to help with symptoms. She tries to chew her food well.  Nutrition Diagnosis: Food and Nutrition Related Knowledge Deficit improved.  Intervention: Educated patient to continue small amounts of food throughout the day and limit liquids at meals. Continue to chew food well. Recommended 2 MVI daily, 1500 mg Calcium Citrate split into 3 doses 2 hours after MVI and B12 injections (injections per MD instead of oral B12)  Monitoring, Evaluation, Goals: Patient will tolerate oral intake for weight maintenance.  No follow up scheduled. Patient has my contact information.

## 2019-01-13 ENCOUNTER — Other Ambulatory Visit: Payer: Self-pay | Admitting: Hematology

## 2019-01-13 DIAGNOSIS — C162 Malignant neoplasm of body of stomach: Secondary | ICD-10-CM

## 2019-01-14 ENCOUNTER — Ambulatory Visit: Payer: BC Managed Care – PPO

## 2019-01-17 DIAGNOSIS — C162 Malignant neoplasm of body of stomach: Secondary | ICD-10-CM | POA: Diagnosis not present

## 2019-01-19 NOTE — Progress Notes (Addendum)
[place]   [phone]) [phone] [phone]) [phone]   Survivorship clinic note  Patient Care Team: Emeterio Reeve, DO as PCP - General (Osteopathic Medicine) [name], MD as Consulting Physician (General Surgery) [name], MD as Consulting Physician (Hematology) Alla Feeling, NP as Nurse Practitioner (Nurse Practitioner) 01/20/2019  CLINIC: Survivorship   SUMMARY OF ONCOLOGIC HISTORY: Oncology History Overview Note  Cancer Staging Gastric cancer [facility]) Staging form: Stomach, AJCC 8th Edition - Clinical stage from 03/29/2018: Stage IVA (cT4b, cN1, cM0) - Signed by [name], MD on 04/03/2018 - Pathologic stage from 10/02/2018: ypT0, pN0, cM0 - Signed by Alla Feeling, NP on 01/19/2019     Gastric cancer ([place])  03/19/2018 Procedure   Upper Endoscopy by Dr. Michail Sermon 03/29/18  IMPRESSION - Normal esophagus. - Z-line regular, 38 cm from the incisors. - Non-obstructing oozing gastric ulcer with pigmented material. Biopsied. - Normal examined duodenum. - Acute gastritis.   03/29/2018 Procedure   Colonoscopy by Dr. Michail Sermon 03/29/18 IMPRESSION - Preparation of the colon was fair. - Internal hemorrhoids. - The examined portion of the ileum was normal. - No specimens collected.   03/29/2018 Initial Biopsy   Diagnosis 03/29/18 Stomach, biopsy, Proximal - ADENOCARCINOMA. - GOBLET CELL METAPLASIA. - SEE COMMENT. Microscopic Comment A Warthin Starry stain is negative for the presence of Helicobacter pylori organisms. Dr Vic Ripper has reviewed the case and concurs with this interpretation. Dr Michail Sermon was paged on 03/31/2018. (JBK:ecj 03/31/2018)   03/29/2018 Cancer Staging   Staging form: Stomach, AJCC 8th Edition - Clinical stage from 03/29/2018: Stage IVA (cT4b, cN1, cM0) - Signed by [name], MD on 04/03/2018   04/02/2018 Initial Diagnosis   Gastric cancer ([place])   04/02/2018 Imaging   CT CAP W Contrast 04/02/18 IMPRESSION: 1. Focal thickening lateral  wall proximal stomach with protrusion of low-attenuation soft tissue beyond the expected confines of the gastric wall into the splenic hilum. Imaging features highly concerning for transmural tumor extension into the splenic hilum. Several small nodules in this region are likely lymph nodes, concerning for metastatic disease. 2. Scattered bilateral pulmonary nodules measuring up to 5 mm. Nonspecific, but close attention on follow-up recommended as metastatic disease not excluded. 3. Small lymph nodes in the gastrohepatic ligament, but there is no gastrohepatic or hepato duodenal ligament lymphadenopathy. No evidence for liver metastases. 4.  Aortic Atherosclerois (ICD10-170.0   04/15/2018 - 07/22/2018 Chemotherapy   FLOT4 every 2 weeks starting 04/15/18. She completed 8 cycles on 07/22/18.   08/21/2018 Imaging   CT CAP W Contrast  IMPRESSION: 1. No evidence of residual gastric mass. No abdominopelvic adenopathy. 2. Similar bilateral pulmonary nodules, indeterminate. 3. Development of upper lung and peribronchovascular predominant pulmonary opacities. Differential considerations include drug toxicity or atypical infection. Lymphangitic tumor spread felt less likely but cannot be excluded. Consider chest CT follow-up at 3-6 months. 4.  Aortic Atherosclerosis (ICD10-I70.0).    10/02/2018 Surgery   LAPAROSCOPY DIAGNOSTIC, TOTAL GASTRECTOMY, FEEDING TUBE PLACEMENT by Dr. 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 - ADENOCARCINOMA IS NOT IDENTIFIED. - THERE IS NO EVIDENCE OF CARCINOMA IN 34 OF 34 LYMPH NODES (0/34). - SEE ONCOLOGY TABLE BELOW.   10/02/2018 Cancer Staging   Staging form: Stomach, AJCC 8th Edition - Pathologic stage from 10/02/2018: ypT0, pN0, cM0 - Signed by Alla Feeling, NP on 01/19/2019   01/20/2019 Survivorship   Per Cira Rue, NP  INTERVAL HISTORY: Ms. Tong presents to survivorship clinic for our initial meeting to review her survivorship care plan detailing her treatment course for gastric cancer, as well as monitoring her long-term side effects from treatment, education regarding health maintenance, screening, and overall wellness and health promotion.   Overall, Ms. Lorette reports feeling well today. She underwent surgery on 10/02/18. She saw Dr. Burr Medico in 11/2018 and Dr. Barry Dienes in 12/2018. Her feeding tube came out recently, she "snacks" throughout the day with small frequent amounts. Her main issues is post prandial gas. She has a med that "coats the lining" that helps her. Meat gives her the least amount of issues. She has a lot of gas pains with vegetables. She has difficulty with cold and carbonated liquids, but not warm drinks. Denies regurgitation or vomiting. Has normal BM once daily. Denies GI bleeding. Denies new or worsening abdominal pain. She takes a probiotic and Geritol multivitamin with B12 and iron. Does not usually use supplements like boost or Ensure. Her energy level "could be better." She is out of bed, functional and active at home. Outings tire her easily and takes a day or so to recover. Has mild neuropathy to fingertips, no pain or sensitivity. She can button clothes and type but takes more time. More annoying than anything, stable since chemo. Otherwise, denies fever, chills, cough, chest pain, dyspnea, or leg swelling.     ONCOLOGY TREATMENT TEAM:  1. Surgeon:  Dr. Barry Dienes at [facility] 2. Medical Oncologist: Dr. Burr Medico   MEDICAL HISTORY:  Past Medical History:  Diagnosis Date   Asthma    Cancer ([place])    Dyspnea    over exertion   Hypertension    Hypothyroidism    Thyroid disease     SURGICAL HISTORY: Past Surgical History:  Procedure Laterality Date   ABDOMINAL HYSTERECTOMY  1996   BILATERAL SALPINGOOPHORECTOMY  2007   BIOPSY  03/29/2018   Procedure: BIOPSY;   Surgeon: Wilford Corner, MD;  Location: [place];  Service: Endoscopy;;   CARPAL TUNNEL RELEASE     COLONOSCOPY N/A 03/29/2018   Procedure: COLONOSCOPY;  Surgeon: Wilford Corner, MD;  Location: [place];  Service: Endoscopy;  Laterality: N/A;   ESOPHAGOGASTRODUODENOSCOPY N/A 03/29/2018   Procedure: ESOPHAGOGASTRODUODENOSCOPY (EGD);  Surgeon: Wilford Corner, MD;  Location: [place];  Service: Endoscopy;  Laterality: N/A;   GASTRECTOMY N/A 10/02/2018   Procedure: TOTAL GASTRECTOMY;  Surgeon: [name], MD;  Location: [place];  Service: General;  Laterality: N/A;   GASTROSTOMY N/A 10/02/2018   Procedure: FEEDING TUBE PLACEMENT;  Surgeon: [name], MD;  Location: [place];  Service: General;  Laterality: N/A;   LAPAROSCOPY N/A 04/13/2018   Procedure: LAPAROSCOPY DIAGNOSTIC;  Surgeon: [name], MD;  Location: [facility];  Service: General;  Laterality: N/A;   LAPAROSCOPY N/A 10/02/2018   Procedure: LAPAROSCOPY DIAGNOSTIC;  Surgeon: [name], MD;  Location: [place];  Service: General;  Laterality: N/A;   MASTECTOMY  2007   PORTACATH PLACEMENT Left 04/13/2018   Procedure: INSERTION [place];  Surgeon: [name], MD;  Location: [facility];  Service: General;  Laterality: Left;  subclavian    I have reviewed the social history and family history with the patient and they are unchanged from previous note.  ALLERGIES:  has No Known Allergies.  MEDICATIONS:  Current Outpatient Medications  Medication Sig Dispense Refill   albuterol (PROVENTIL HFA;VENTOLIN HFA) 108 (90 Base) MCG/ACT inhaler Inhale 1-2 puffs into the lungs every 4 (four) hours as needed  for wheezing or shortness of breath. 2 Inhaler 3   calcium carbonate (TUMS - DOSED IN MG ELEMENTAL CALCIUM) 500 MG chewable tablet Chew 1 tablet (200 mg of elemental calcium total) by mouth 3 (three) times daily. 90 tablet 11   levothyroxine (SYNTHROID) 150 MCG tablet Take 1 tablet (150 mcg total) by  mouth daily before breakfast. Need labs prior to further refills! 90 tablet 0   lidocaine-prilocaine (EMLA) cream Apply 1 application topically daily as needed (prior to port being accessed).      acyclovir (ZOVIRAX) 400 MG tablet Take 1 tablet (400 mg total) by mouth 3 (three) times daily. For five days. As needed for cold sore. (Patient taking differently: Take 400 mg by mouth 3 (three) times daily as needed (cold sores.). ) 15 tablet 0   baclofen (LIORESAL) 10 MG tablet Take 1 tablet (10 mg total) by mouth 4 (four) times daily as needed for muscle spasms. 30 each 0   loperamide (IMODIUM) 2 MG capsule Take 1 capsule (2 mg total) by mouth 2 (two) times daily as needed for diarrhea or loose stools. 30 capsule 0   vitamin B-12 1000 MCG tablet Take 1 tablet (1,000 mcg total) by mouth daily. 30 tablet 0   No current facility-administered medications for this visit.     PHYSICAL EXAMINATION: ECOG PERFORMANCE STATUS: 1 - Symptomatic but completely ambulatory  Vitals:   01/20/19 0942  BP: 134/78  Pulse: 76  Resp: 18  Temp: 97.9 F (36.6 C)  SpO2: 100%   Filed Weights   01/20/19 0942  Weight: 131 lb (59.4 kg)    GENERAL:alert, no distress and comfortable SKIN: no rash  EYES: sclera clear NECK: without mass  LYMPH:  no palpable cervical, supraclavicular lymphadenopathy LUNGS: clear with normal breathing effort HEART: regular rate & rhythm, no lower extremity edema ABDOMEN: abdomen soft and normal bowel sounds. Mild LUQ tenderness. Surgical incision and J tube site all healed NEURO: alert & oriented x 3 with fluent speech, no focal motor/sensory deficits per tuning fork exam PAC without erythema Breast exam: s/p right mastectomy with reconstruction. Incisions all healed. No palpable mass in either breast, right chest wall that I could appreciate  LABORATORY DATA:  I have reviewed the data as listed CBC Latest Ref Rng & Units 01/20/2019 11/20/2018 10/24/2018  WBC 4.0 - 10.5 K/uL 4.1  6.1 8.2  Hemoglobin 12.0 - 15.0 g/dL 11.0(L) 10.0(L) 8.8(L)  Hematocrit 36.0 - 46.0 % 31.7(L) 29.4(L) 27.6(L)  Platelets 150 - 400 K/uL 145(L) 208 366     CMP Latest Ref Rng & Units 01/20/2019 11/20/2018 10/24/2018  Glucose 70 - 99 mg/dL 124(H) 78 97  BUN 6 - 20 mg/dL 10 17 24(H)  Creatinine 0.44 - 1.00 mg/dL 0.61 0.55 0.61  Sodium 135 - 145 mmol/L 141 138 136  Potassium 3.5 - 5.1 mmol/L 3.3(L) 3.8 4.8  Chloride 98 - 111 mmol/L 104 104 99  CO2 22 - 32 mmol/L 27 26 26   Calcium 8.9 - 10.3 mg/dL 9.2 9.0 9.0  Total Protein 6.5 - 8.1 g/dL 7.5 7.9 8.1  Total Bilirubin 0.3 - 1.2 mg/dL 0.3 0.2(L) 0.4  Alkaline Phos 38 - 126 U/L 89 80 112  AST 15 - 41 U/L 24 20 26   ALT 0 - 44 U/L 24 15 38      RADIOGRAPHIC STUDIES: I have personally reviewed the radiological images as listed and agreed with the findings in the report. No results found.   ASSESSMENT & PLAN:  Ms. Tanmayi Pendley. 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  She presents to the Survivorship Clinic for our initial meeting and routine follow-up post-completion of treatment for gastric cancer.    1. Initially clinical stage IV adenocarcinoma of the gastric cardia: Ms. Brye is continuing to recover from definitive treatment for gastric cancer. She completed 8 cycles of neoadjuvant FLOT4 chemo. Her restaging CT showed complete radiographic response to chemo. She underwent total gastrectomy in 10/02/18 per Dr. Barry Dienes, final path consistent with complete pathologic response, no residual tumor, clear margins, and 34/34 lymph nodes were negative, ypT0N0. We reviewed while this is a good prognostic indicator, gastric cancer has a high risk for recurrence in general. Physical exam today is benign, no concern for gastric cancer recurrence. We reviewed signs of recurrence such as new or worsening  abdominal pain, GI bleeding, new/worse fatigue, or unintentional weight loss. She knows what to watch for. She will follow-up with her medical oncologist, Dr. Burr Medico in 03/2019 with history and physical exam per surveillance protocol.    Today, a comprehensive survivorship care plan and treatment summary was reviewed with the patient today detailing her gastric cancer diagnosis, treatment course, potential late/long-term effects of treatment, appropriate follow-up care with recommendations for the future, and patient education resources.  A copy of this summary, along with a letter will be sent to the patients primary care provider via In Basket message after todays visit.    2. Chronic fatigue, secondary to chemo, surgery, anemia, and nutrition: She remains active at home, able to function normally with some effort. Takes her a few days to recover after long day of outings. Her anemia is improving, Hgb 11.0 today. She does not require blood transfusion. Ferritin is elevated, she takes multivitamin with iron and B12. She does have evidence of B12 deficiency related to her gastrectomy, I recommend to begin B12 injection monthly. She eats diet high in carbs and sweets, which is also contributing to her fatigue, we reviewed healthy diet and nutrition rainbow. I will refer her back to dietician.   3. Peripheral neuropathy, secondary to chemotherapy (docetaxel, oxaliplatin): mild, not limiting activities but does require more concentration. No sensory deficits on exam today. Her B12 deficiency may also contribute to her neuropathy. She is not experiencing pain or sensitivity or deficits, therefore I will hold on gabapentin for now, will consider if this worsens in the future. We reviewed the expectation for stability vs gradual improvement with CIPN.   4. Genetics Due to her personal history of breast and gastric cancers, she qualifies for genetic testing. I reviewed the possible results that genetic tests might  reveal and those potential implications for family members getting tested if there was a pathogenic result. She is not interested in pursuing genetic testing until she pays down some medical bills. She also states she is not truly interested in knowing her risks of more cancer because she does not want to live in fear.   5. Cancer screening:  Due to her age and history, she should receive screening for skin cancers, colon cancer, and gynecologic cancers.  She had a complete hysterectomy, no role for PAP smear. She had a colonoscopy per Dr. Michail Sermon in 03/2018 at the time of her gastric cancer diagnosis, she is on 10 year recall unless she develops new concerns such as GI bleeding, change in bowel  habits, or new concerns. She has a left screening mammogram scheduled on 02/03/19. Routine upper endoscopy is not supported, but will be done if indicated should she have new issues such as worsening dysphagia. Due to her h/o right breast cancer, she underwent right mastectomy and reconstruction. A breast exam was done today as it had not been done this year; breast exam is benign, no concern for recurrence. This information was reviewed in detail with the patient today.   6. Health maintenance and wellness promotion: Ms. Delmonico was encouraged to consume 5-7 servings of fruits and vegetables per day as tolerated. She has more gas with vegetables. She can try gas-X OTC. We reviewed the "Nutrition Rainbow" handout, as well as the handout "Take Control of Your Health and Reduce Your Cancer Risk" from the [place].  She was also encouraged to engage in moderate to vigorous exercise for 30 minutes per day most days of the week. We discussed the [employer] fitness program, which is designed for cancer survivors to help them become more physically fit after cancer treatments. We also reviewed COVID19 restrictions which include the use of gyms. She was instructed to limit her alcohol consumption and continue  to abstain from tobacco.     7. Support services/counseling: It is not uncommon for this period of the patient's cancer care trajectory to be one of many emotions and stressors.  We discussed an opportunity for her to participate in the next session of [facility] ("Finding Your New Normal") support group series designed for patients after they have completed treatment.  Ms. Marts was encouraged to take advantage of our many other support services programs, support groups, and/or counseling in coping with her new life as a cancer survivor after completing anti-cancer treatment.  She was offered support today through active listening and expressive supportive counseling.  She was given information regarding our available services and encouraged to contact me with any questions or for help enrolling in any of our support group/programs.    Dispo:   -Return to cancer center for lab, flush, and surveillance CT on 12/30 -Surveillance f/u with Dr. Burr Medico on 03/22/2019  -F/u with Dr. Barry Dienes (surgery) 05/2019 -F/u with Dr. Michail Sermon (GI) as indicated  -Mammogram due 02/03/2019  -begin B12 injection  -f/u with dietician  -She is welcome to return back to the Survivorship Clinic at any time; no additional follow-up needed at this time.  -Consider referral back to survivorship as a long-term survivor for continued surveillance  A total of (40) minutes of face-to-face time was spent with this patient with greater than 50% of that time in counseling and care-coordination.  All questions were answered. The patient knows to call the clinic with any problems, questions or concerns. No barriers to learning were detected.     Alla Feeling, NP 01/20/19

## 2019-01-20 ENCOUNTER — Other Ambulatory Visit: Payer: Self-pay

## 2019-01-20 ENCOUNTER — Inpatient Hospital Stay: Payer: BC Managed Care – PPO

## 2019-01-20 ENCOUNTER — Inpatient Hospital Stay: Payer: BC Managed Care – PPO | Attending: Hematology | Admitting: Nurse Practitioner

## 2019-01-20 ENCOUNTER — Telehealth: Payer: Self-pay

## 2019-01-20 ENCOUNTER — Encounter: Payer: Self-pay | Admitting: Nurse Practitioner

## 2019-01-20 VITALS — BP 134/78 | HR 76 | Temp 97.9°F | Resp 18 | Ht 61.0 in | Wt 131.0 lb

## 2019-01-20 DIAGNOSIS — Z95828 Presence of other vascular implants and grafts: Secondary | ICD-10-CM

## 2019-01-20 DIAGNOSIS — C162 Malignant neoplasm of body of stomach: Secondary | ICD-10-CM

## 2019-01-20 DIAGNOSIS — D649 Anemia, unspecified: Secondary | ICD-10-CM | POA: Diagnosis not present

## 2019-01-20 DIAGNOSIS — R141 Gas pain: Secondary | ICD-10-CM | POA: Insufficient documentation

## 2019-01-20 DIAGNOSIS — G620 Drug-induced polyneuropathy: Secondary | ICD-10-CM | POA: Diagnosis not present

## 2019-01-20 DIAGNOSIS — D500 Iron deficiency anemia secondary to blood loss (chronic): Secondary | ICD-10-CM

## 2019-01-20 DIAGNOSIS — E538 Deficiency of other specified B group vitamins: Secondary | ICD-10-CM

## 2019-01-20 DIAGNOSIS — Z853 Personal history of malignant neoplasm of breast: Secondary | ICD-10-CM | POA: Diagnosis not present

## 2019-01-20 DIAGNOSIS — C169 Malignant neoplasm of stomach, unspecified: Secondary | ICD-10-CM

## 2019-01-20 DIAGNOSIS — R530 Neoplastic (malignant) related fatigue: Secondary | ICD-10-CM | POA: Insufficient documentation

## 2019-01-20 LAB — FERRITIN: Ferritin: 538 ng/mL — ABNORMAL HIGH (ref 11–307)

## 2019-01-20 LAB — CMP (CANCER CENTER ONLY)
ALT: 24 U/L (ref 0–44)
AST: 24 U/L (ref 15–41)
Albumin: 3.7 g/dL (ref 3.5–5.0)
Alkaline Phosphatase: 89 U/L (ref 38–126)
Anion gap: 10 (ref 5–15)
BUN: 10 mg/dL (ref 6–20)
CO2: 27 mmol/L (ref 22–32)
Calcium: 9.2 mg/dL (ref 8.9–10.3)
Chloride: 104 mmol/L (ref 98–111)
Creatinine: 0.61 mg/dL (ref 0.44–1.00)
GFR, Est AFR Am: >60 mL/min (ref >60)
GFR, Estimated: >60 mL/min (ref >60)
Glucose, Bld: 124 mg/dL — ABNORMAL HIGH (ref 70–99)
Potassium: 3.3 mmol/L — ABNORMAL LOW (ref 3.5–5.1)
Sodium: 141 mmol/L (ref 135–145)
Total Bilirubin: 0.3 mg/dL (ref 0.3–1.2)
Total Protein: 7.5 g/dL (ref 6.5–8.1)

## 2019-01-20 LAB — CBC WITH DIFFERENTIAL (CANCER CENTER ONLY)
Abs Immature Granulocytes: 0.01 10*3/uL (ref 0.00–0.07)
Basophils Absolute: 0 10*3/uL (ref 0.0–0.1)
Basophils Relative: 1 %
Eosinophils Absolute: 0.3 10*3/uL (ref 0.0–0.5)
Eosinophils Relative: 7 %
HCT: 31.7 % — ABNORMAL LOW (ref 36.0–46.0)
Hemoglobin: 11 g/dL — ABNORMAL LOW (ref 12.0–15.0)
Immature Granulocytes: 0 %
Lymphocytes Relative: 40 %
Lymphs Abs: 1.6 10*3/uL (ref 0.7–4.0)
MCH: 32.9 pg (ref 26.0–34.0)
MCHC: 34.7 g/dL (ref 30.0–36.0)
MCV: 94.9 fL (ref 80.0–100.0)
Monocytes Absolute: 0.3 10*3/uL (ref 0.1–1.0)
Monocytes Relative: 8 %
Neutro Abs: 1.8 10*3/uL (ref 1.7–7.7)
Neutrophils Relative %: 44 %
Platelet Count: 145 10*3/uL — ABNORMAL LOW (ref 150–400)
RBC: 3.34 MIL/uL — ABNORMAL LOW (ref 3.87–5.11)
RDW: 13 % (ref 11.5–15.5)
WBC Count: 4.1 10*3/uL (ref 4.0–10.5)
nRBC: 0 % (ref 0.0–0.2)

## 2019-01-20 LAB — VITAMIN B12: Vitamin B-12: 173 pg/mL — ABNORMAL LOW (ref 180–914)

## 2019-01-20 MED ORDER — SODIUM CHLORIDE 0.9% FLUSH
10.0000 mL | Freq: Once | INTRAVENOUS | Status: AC
  Administered 2019-01-20: 10 mL
  Filled 2019-01-20: qty 10

## 2019-01-20 MED ORDER — HEPARIN SOD (PORK) LOCK FLUSH 100 UNIT/ML IV SOLN
500.0000 [IU] | Freq: Once | INTRAVENOUS | Status: AC
  Administered 2019-01-20: 500 [IU]
  Filled 2019-01-20: qty 5

## 2019-01-20 NOTE — Patient Instructions (Signed)

## 2019-01-20 NOTE — Telephone Encounter (Signed)
-----   Message from Alla Feeling, NP sent at 01/20/2019  3:23 PM EST ----- OOPS! Coy Saunas.   Sorry about that ----- Message ----- From: Gaspar Bidding Sent: 01/20/2019   3:13 PM EST To: Karie Mainland, RD, [name], MD, #  Who's the patient? ----- Message ----- From: Alla Feeling, NP Sent: 01/20/2019   2:54 PM EST To: Karie Mainland, RD, [name], MD, #  I saw Ms. Schlitt for gastric cancer survivorship today, she is doing pretty well.Hillard Danker, can you please get her CT authorized to be done 12/30 after her labs and flush, she will see Dr. Burr Medico 1/4 to discuss results. Somaly Marteney please follow and schedule once it's approved.   Dee, I placed orders for B12 injection starting now, please get authorized.  Cecille Rubin, please call her and let her know iron level is elevated, no need for IV iron, but she does have B12 deficiency and I recommend to start monthly injections. If she agrees, please send schedule message for monthly B12 x4 starting next week. This may help her fatigue and neuropathy.   Barb, she is having some difficulty with liquids since her surgery. Do you recommend thickener? I know you talked to her recently, could you please f/u with recommendations. She is doing well with solids, except gas.   Thanks team! Regan Rakers

## 2019-01-20 NOTE — Telephone Encounter (Signed)
Unable to reach patient.  Left vm asking for patient to return call to go over info from NP.

## 2019-01-21 ENCOUNTER — Telehealth: Payer: Self-pay | Admitting: Nurse Practitioner

## 2019-01-21 LAB — CANCER ANTIGEN 19-9: CA 19-9: 8 U/mL (ref 0–35)

## 2019-01-21 NOTE — Telephone Encounter (Signed)
No los per 11/4 °

## 2019-01-25 ENCOUNTER — Encounter: Payer: Self-pay | Admitting: Osteopathic Medicine

## 2019-01-26 MED ORDER — BUDESONIDE-FORMOTEROL FUMARATE 160-4.5 MCG/ACT IN AERO: 2.0000 | INHALATION_SPRAY | Freq: Two times a day (BID) | RESPIRATORY_TRACT | 11 refills | Status: DC

## 2019-01-26 MED ORDER — ALBUTEROL SULFATE HFA 108 (90 BASE) MCG/ACT IN AERS: 1.0000 | INHALATION_SPRAY | RESPIRATORY_TRACT | 11 refills | Status: DC | PRN

## 2019-01-26 NOTE — Telephone Encounter (Signed)
Both RX pended, pulled from historical medication.   Please review RF and send if appropriate.   Thanks!

## 2019-01-27 ENCOUNTER — Telehealth: Payer: Self-pay | Admitting: Hematology

## 2019-01-27 NOTE — Telephone Encounter (Signed)
R/s appt per 11/11 sch message - unable to reach pt . Left message with appt date and time

## 2019-01-27 NOTE — Telephone Encounter (Signed)
Also called pt about injection aptps being added - still unable to reach pt . Left another message with injection times

## 2019-02-03 ENCOUNTER — Other Ambulatory Visit: Payer: Self-pay

## 2019-02-03 ENCOUNTER — Ambulatory Visit (INDEPENDENT_AMBULATORY_CARE_PROVIDER_SITE_OTHER): Payer: BC Managed Care – PPO

## 2019-02-03 DIAGNOSIS — Z1231 Encounter for screening mammogram for malignant neoplasm of breast: Secondary | ICD-10-CM

## 2019-02-04 ENCOUNTER — Other Ambulatory Visit: Payer: Self-pay

## 2019-02-04 ENCOUNTER — Inpatient Hospital Stay: Payer: BC Managed Care – PPO

## 2019-02-04 VITALS — BP 128/70 | HR 72 | Temp 98.2°F | Resp 18

## 2019-02-04 DIAGNOSIS — D649 Anemia, unspecified: Secondary | ICD-10-CM | POA: Diagnosis not present

## 2019-02-04 DIAGNOSIS — D500 Iron deficiency anemia secondary to blood loss (chronic): Secondary | ICD-10-CM

## 2019-02-04 DIAGNOSIS — Z95828 Presence of other vascular implants and grafts: Secondary | ICD-10-CM

## 2019-02-04 DIAGNOSIS — Z853 Personal history of malignant neoplasm of breast: Secondary | ICD-10-CM | POA: Diagnosis not present

## 2019-02-04 DIAGNOSIS — G620 Drug-induced polyneuropathy: Secondary | ICD-10-CM | POA: Diagnosis not present

## 2019-02-04 DIAGNOSIS — R530 Neoplastic (malignant) related fatigue: Secondary | ICD-10-CM | POA: Diagnosis not present

## 2019-02-04 DIAGNOSIS — E538 Deficiency of other specified B group vitamins: Secondary | ICD-10-CM | POA: Diagnosis not present

## 2019-02-04 DIAGNOSIS — C162 Malignant neoplasm of body of stomach: Secondary | ICD-10-CM | POA: Diagnosis not present

## 2019-02-04 DIAGNOSIS — R141 Gas pain: Secondary | ICD-10-CM | POA: Diagnosis not present

## 2019-02-04 DIAGNOSIS — C169 Malignant neoplasm of stomach, unspecified: Secondary | ICD-10-CM

## 2019-02-04 LAB — CMP (CANCER CENTER ONLY)
ALT: 25 U/L (ref 0–44)
AST: 24 U/L (ref 15–41)
Albumin: 3.9 g/dL (ref 3.5–5.0)
Alkaline Phosphatase: 93 U/L (ref 38–126)
Anion gap: 10 (ref 5–15)
BUN: 11 mg/dL (ref 6–20)
CO2: 28 mmol/L (ref 22–32)
Calcium: 8.9 mg/dL (ref 8.9–10.3)
Chloride: 106 mmol/L (ref 98–111)
Creatinine: 0.62 mg/dL (ref 0.44–1.00)
GFR, Est AFR Am: >60 mL/min (ref >60)
GFR, Estimated: >60 mL/min (ref >60)
Glucose, Bld: 74 mg/dL (ref 70–99)
Potassium: 3.5 mmol/L (ref 3.5–5.1)
Sodium: 144 mmol/L (ref 135–145)
Total Bilirubin: 0.3 mg/dL (ref 0.3–1.2)
Total Protein: 7.6 g/dL (ref 6.5–8.1)

## 2019-02-04 LAB — CBC WITH DIFFERENTIAL (CANCER CENTER ONLY)
Abs Immature Granulocytes: 0.01 10*3/uL (ref 0.00–0.07)
Basophils Absolute: 0.1 10*3/uL (ref 0.0–0.1)
Basophils Relative: 1 %
Eosinophils Absolute: 0.5 10*3/uL (ref 0.0–0.5)
Eosinophils Relative: 8 %
HCT: 32.5 % — ABNORMAL LOW (ref 36.0–46.0)
Hemoglobin: 11.4 g/dL — ABNORMAL LOW (ref 12.0–15.0)
Immature Granulocytes: 0 %
Lymphocytes Relative: 39 %
Lymphs Abs: 2.3 10*3/uL (ref 0.7–4.0)
MCH: 32.9 pg (ref 26.0–34.0)
MCHC: 35.1 g/dL (ref 30.0–36.0)
MCV: 93.9 fL (ref 80.0–100.0)
Monocytes Absolute: 0.5 10*3/uL (ref 0.1–1.0)
Monocytes Relative: 9 %
Neutro Abs: 2.6 10*3/uL (ref 1.7–7.7)
Neutrophils Relative %: 43 %
Platelet Count: 175 10*3/uL (ref 150–400)
RBC: 3.46 MIL/uL — ABNORMAL LOW (ref 3.87–5.11)
RDW: 12.5 % (ref 11.5–15.5)
WBC Count: 6 10*3/uL (ref 4.0–10.5)
nRBC: 0 % (ref 0.0–0.2)

## 2019-02-04 LAB — FERRITIN: Ferritin: 521 ng/mL — ABNORMAL HIGH (ref 11–307)

## 2019-02-04 MED ORDER — CYANOCOBALAMIN 1000 MCG/ML IJ SOLN
1000.0000 ug | Freq: Once | INTRAMUSCULAR | Status: AC
  Administered 2019-02-04: 15:00:00 1000 ug via INTRAMUSCULAR

## 2019-02-04 NOTE — Patient Instructions (Signed)

## 2019-02-11 DIAGNOSIS — C162 Malignant neoplasm of body of stomach: Secondary | ICD-10-CM | POA: Diagnosis not present

## 2019-02-12 ENCOUNTER — Encounter: Payer: Self-pay | Admitting: Osteopathic Medicine

## 2019-02-16 DIAGNOSIS — C162 Malignant neoplasm of body of stomach: Secondary | ICD-10-CM | POA: Diagnosis not present

## 2019-02-19 ENCOUNTER — Encounter: Payer: BC Managed Care – PPO | Admitting: Nutrition

## 2019-02-19 ENCOUNTER — Telehealth: Payer: Self-pay | Admitting: Nutrition

## 2019-02-19 NOTE — Telephone Encounter (Signed)
Contacted patient by telephone. Patient was diagnosed with gastric cancer in January 2020. She completed 8 cycles chemotherapy on Jul 22, 2018. She is status post total gastrectomy with feeding tube placement in July 2020. Her feeding tube has fallen out and she has not had this replaced. She is followed by Dr. Burr Medico for observation. She reports she is trying to eat however swallowing is difficult. She endorses some weight loss. Last weight documented was 131 pounds on November 4 down from 135 pounds. Patient reports it is no one particular food that she has difficulty swallowing. She states everything she swallows gives her fullness at her breastbone She has eliminated all bread due to the difficulty of swallowing. She does not enjoy oral nutrition supplements however she has found a beverage called revive which she tolerates to some extent. She reports taking her multivitamin and B12 shots. Her energy level is still not back to baseline.  Nutrition diagnosis: Food and nutrition related knowledge deficit is stable.  Intervention: Educated patient to contact her physician secondary to difficulty swallowing. Enforced importance of patient chewing food well and trying to increase calories and protein. Encouraged her to continue to explore oral nutrition supplements.  Monitoring, evaluation, goals: Patient will work to increase calories and protein to minimize further weight loss.  No follow-up is scheduled. Patient has my contact information for questions or concerns.  **Disclaimer: This note was dictated with voice recognition software. Similar sounding words can inadvertently be transcribed and this note may contain transcription errors which may not have been corrected upon publication of note.**

## 2019-02-23 ENCOUNTER — Other Ambulatory Visit: Payer: Self-pay | Admitting: General Surgery

## 2019-02-23 DIAGNOSIS — Z903 Acquired absence of stomach [part of]: Secondary | ICD-10-CM

## 2019-02-26 ENCOUNTER — Ambulatory Visit
Admission: RE | Admit: 2019-02-26 | Discharge: 2019-02-26 | Disposition: A | Discharge status: Home / Self Care | Payer: BC Managed Care – PPO | Source: Ambulatory Visit | Attending: General Surgery | Admitting: General Surgery

## 2019-02-26 DIAGNOSIS — Z903 Acquired absence of stomach [part of]: Secondary | ICD-10-CM

## 2019-02-26 DIAGNOSIS — K289 Gastrojejunal ulcer, unspecified as acute or chronic, without hemorrhage or perforation: Secondary | ICD-10-CM | POA: Diagnosis not present

## 2019-03-01 ENCOUNTER — Encounter: Payer: Self-pay | Admitting: Gastroenterology

## 2019-03-10 ENCOUNTER — Ambulatory Visit (HOSPITAL_COMMUNITY)
Admission: RE | Admit: 2019-03-10 | Discharge: 2019-03-10 | Disposition: A | Discharge status: Home / Self Care | Payer: BC Managed Care – PPO | Source: Ambulatory Visit | Attending: Hematology | Admitting: Hematology

## 2019-03-10 ENCOUNTER — Inpatient Hospital Stay: Payer: BC Managed Care – PPO | Attending: Hematology

## 2019-03-10 ENCOUNTER — Inpatient Hospital Stay: Payer: BC Managed Care – PPO

## 2019-03-10 ENCOUNTER — Other Ambulatory Visit: Payer: Self-pay

## 2019-03-10 ENCOUNTER — Encounter (HOSPITAL_COMMUNITY): Payer: Self-pay

## 2019-03-10 DIAGNOSIS — C169 Malignant neoplasm of stomach, unspecified: Secondary | ICD-10-CM

## 2019-03-10 DIAGNOSIS — E538 Deficiency of other specified B group vitamins: Secondary | ICD-10-CM | POA: Diagnosis not present

## 2019-03-10 DIAGNOSIS — C162 Malignant neoplasm of body of stomach: Secondary | ICD-10-CM | POA: Insufficient documentation

## 2019-03-10 DIAGNOSIS — D500 Iron deficiency anemia secondary to blood loss (chronic): Secondary | ICD-10-CM

## 2019-03-10 LAB — CMP (CANCER CENTER ONLY)
ALT: 49 U/L — ABNORMAL HIGH (ref 0–44)
AST: 41 U/L (ref 15–41)
Albumin: 3.8 g/dL (ref 3.5–5.0)
Alkaline Phosphatase: 94 U/L (ref 38–126)
Anion gap: 11 (ref 5–15)
BUN: 11 mg/dL (ref 6–20)
CO2: 27 mmol/L (ref 22–32)
Calcium: 9.1 mg/dL (ref 8.9–10.3)
Chloride: 103 mmol/L (ref 98–111)
Creatinine: 0.57 mg/dL (ref 0.44–1.00)
GFR, Est AFR Am: >60 mL/min (ref >60)
GFR, Estimated: >60 mL/min (ref >60)
Glucose, Bld: 81 mg/dL (ref 70–99)
Potassium: 3.6 mmol/L (ref 3.5–5.1)
Sodium: 141 mmol/L (ref 135–145)
Total Bilirubin: 0.4 mg/dL (ref 0.3–1.2)
Total Protein: 7.6 g/dL (ref 6.5–8.1)

## 2019-03-10 LAB — CBC WITH DIFFERENTIAL (CANCER CENTER ONLY)
Abs Immature Granulocytes: 0.01 10*3/uL (ref 0.00–0.07)
Basophils Absolute: 0 10*3/uL (ref 0.0–0.1)
Basophils Relative: 1 %
Eosinophils Absolute: 0.5 10*3/uL (ref 0.0–0.5)
Eosinophils Relative: 8 %
HCT: 33.6 % — ABNORMAL LOW (ref 36.0–46.0)
Hemoglobin: 11.7 g/dL — ABNORMAL LOW (ref 12.0–15.0)
Immature Granulocytes: 0 %
Lymphocytes Relative: 37 %
Lymphs Abs: 2.1 10*3/uL (ref 0.7–4.0)
MCH: 32.9 pg (ref 26.0–34.0)
MCHC: 34.8 g/dL (ref 30.0–36.0)
MCV: 94.4 fL (ref 80.0–100.0)
Monocytes Absolute: 0.4 10*3/uL (ref 0.1–1.0)
Monocytes Relative: 7 %
Neutro Abs: 2.6 10*3/uL (ref 1.7–7.7)
Neutrophils Relative %: 47 %
Platelet Count: 176 10*3/uL (ref 150–400)
RBC: 3.56 MIL/uL — ABNORMAL LOW (ref 3.87–5.11)
RDW: 12.7 % (ref 11.5–15.5)
WBC Count: 5.6 10*3/uL (ref 4.0–10.5)
nRBC: 0 % (ref 0.0–0.2)

## 2019-03-10 LAB — FERRITIN: Ferritin: 501 ng/mL — ABNORMAL HIGH (ref 11–307)

## 2019-03-10 MED ORDER — SODIUM CHLORIDE 0.9% FLUSH
10.0000 mL | Freq: Once | INTRAVENOUS | Status: AC
  Administered 2019-03-10: 10 mL
  Filled 2019-03-10: qty 10

## 2019-03-10 MED ORDER — SODIUM CHLORIDE (PF) 0.9 % IJ SOLN
INTRAMUSCULAR | Status: AC
  Filled 2019-03-10: qty 50

## 2019-03-10 MED ORDER — IOHEXOL 300 MG/ML  SOLN
100.0000 mL | Freq: Once | INTRAMUSCULAR | Status: AC | PRN
  Administered 2019-03-10: 100 mL via INTRAVENOUS

## 2019-03-10 MED ORDER — HEPARIN SOD (PORK) LOCK FLUSH 100 UNIT/ML IV SOLN
INTRAVENOUS | Status: AC
  Administered 2019-03-10: 500 [IU]
  Filled 2019-03-10: qty 5

## 2019-03-11 LAB — CANCER ANTIGEN 19-9: CA 19-9: 7 U/mL (ref 0–35)

## 2019-03-13 DIAGNOSIS — C162 Malignant neoplasm of body of stomach: Secondary | ICD-10-CM | POA: Diagnosis not present

## 2019-03-16 ENCOUNTER — Telehealth: Payer: Self-pay | Admitting: Hematology

## 2019-03-16 ENCOUNTER — Inpatient Hospital Stay: Payer: BC Managed Care – PPO

## 2019-03-16 ENCOUNTER — Other Ambulatory Visit: Payer: Self-pay

## 2019-03-16 VITALS — BP 125/88 | HR 72 | Temp 97.4°F | Resp 18

## 2019-03-16 DIAGNOSIS — C162 Malignant neoplasm of body of stomach: Secondary | ICD-10-CM | POA: Diagnosis not present

## 2019-03-16 DIAGNOSIS — E538 Deficiency of other specified B group vitamins: Secondary | ICD-10-CM | POA: Diagnosis not present

## 2019-03-16 DIAGNOSIS — Z95828 Presence of other vascular implants and grafts: Secondary | ICD-10-CM

## 2019-03-16 DIAGNOSIS — D500 Iron deficiency anemia secondary to blood loss (chronic): Secondary | ICD-10-CM

## 2019-03-16 MED ORDER — CYANOCOBALAMIN 1000 MCG/ML IJ SOLN
1000.0000 ug | Freq: Once | INTRAMUSCULAR | Status: AC
  Administered 2019-03-16: 1000 ug via INTRAMUSCULAR

## 2019-03-16 NOTE — Telephone Encounter (Signed)
Returned patient's phone call regarding rescheduling missed 12/23 appointment, per patient's request appointment has been made on 12/29. Rescheduled 01/19 appointment time.

## 2019-03-16 NOTE — Patient Instructions (Signed)
Cyanocobalamin, Pyridoxine, and Folate What is this medicine? A multivitamin containing folic acid, vitamin B6, and vitamin B12. This medicine may be used for other purposes; ask your health care provider or pharmacist if you have questions. COMMON BRAND NAME(S): AllanFol RX, AllanTex, Av-Vite FB, B Complex with Folic Acid, ComBgen, FaBB, Folamin, Folastin, Folbalin, Folbee, Folbic, Folcaps, Folgard, Folgard RX, Folgard RX 2.2, Folplex, Folplex 2.2, Foltabs 800, Foltx, Homocysteine Formula, Niva-Fol, NuFol, TL Gard RX, Virt-Gard, Virt-Vite, Virt-Vite Forte, Vita-Respa What should I tell my health care provider before I take this medicine? They need to know if you have any of these conditions:  bleeding or clotting disorder  history of anemia of any type  other chronic health condition  an unusual or allergic reaction to vitamins, other medicines, foods, dyes, or preservatives  pregnant or trying to get pregnant  breast-feeding How should I use this medicine? Take by mouth with a glass of water. May take with food. Follow the directions on the prescription label. It is usually given once a day. Do not take your medicine more often than directed. Contact your pediatrician regarding the use of this medicine in children. Special care may be needed. Overdosage: If you think you have taken too much of this medicine contact a poison control center or emergency room at once. NOTE: This medicine is only for you. Do not share this medicine with others. What if I miss a dose? If you miss a dose, take it as soon as you can. If it is almost time for your next dose, take only that dose. Do not take double or extra doses. What may interact with this medicine?  levodopa This list may not describe all possible interactions. Give your health care provider a list of all the medicines, herbs, non-prescription drugs, or dietary supplements you use. Also tell them if you smoke, drink alcohol, or use illegal  drugs. Some items may interact with your medicine. What should I watch for while using this medicine? See your health care professional for regular checks on your progress. Remember that vitamin supplements do not replace the need for good nutrition from a balanced diet. What side effects may I notice from receiving this medicine? Side effects that you should report to your doctor or health care professional as soon as possible:  allergic reaction such as skin rash or difficulty breathing  vomiting Side effects that usually do not require medical attention (report to your doctor or health care professional if they continue or are bothersome):  nausea  stomach upset This list may not describe all possible side effects. Call your doctor for medical advice about side effects. You may report side effects to FDA at 1-800-FDA-1088. Where should I keep my medicine? Keep out of the reach of children. Most vitamins should be stored at controlled room temperature. Check your specific product directions. Protect from heat and moisture. Throw away any unused medicine after the expiration date. NOTE: This sheet is a summary. It may not cover all possible information. If you have questions about this medicine, talk to your doctor, pharmacist, or health care provider.  2020 Elsevier/Gold Standard (2007-04-25 00:59:55)  

## 2019-03-17 ENCOUNTER — Other Ambulatory Visit: Payer: BC Managed Care – PPO

## 2019-03-17 NOTE — Progress Notes (Signed)
[name]   Telephone:(336) [phone] Fax:(336) [phone]   Clinic Follow up Note   Patient Care Team: Emeterio Reeve, DO as PCP - General (Osteopathic Medicine) [name], MD as Consulting Physician (General Surgery) [name], MD as Consulting Physician (Hematology) Alla Feeling, NP as Nurse Practitioner (Nurse Practitioner)  I connected with Mariah Kemp on 03/22/2019 at  8:40 AM EST by video enabled telemedicine visit and verified that I am speaking with the correct person using two identifiers.  I discussed the limitations, risks, security and privacy concerns of performing an evaluation and management service by telephone and the availability of in person appointments. I also discussed with the patient that there may be a patient responsible charge related to this service. The patient expressed understanding and agreed to proceed.   Patient's location:  Her home  Provider's location:  My Office   CHIEF COMPLAINT: f/u of Gastric Cancer  SUMMARY OF ONCOLOGIC HISTORY: Oncology History Overview Note  Cancer Staging Gastric cancer [facility]) Staging form: Stomach, AJCC 8th Edition - Clinical stage from 03/29/2018: Stage IVA (cT4b, cN1, cM0) - Signed by [name], MD on 04/03/2018 - Pathologic stage from 10/02/2018: ypT0, pN0, cM0 - Signed by Alla Feeling, NP on 01/19/2019     Gastric cancer ([place])  03/19/2018 Procedure   Upper Endoscopy by Dr. Michail Sermon 03/29/18  IMPRESSION - Normal esophagus. - Z-line regular, 38 cm from the incisors. - Non-obstructing oozing gastric ulcer with pigmented material. Biopsied. - Normal examined duodenum. - Acute gastritis.   03/29/2018 Procedure   Colonoscopy by Dr. Michail Sermon 03/29/18 IMPRESSION - Preparation of the colon was fair. - Internal hemorrhoids. - The examined portion of the ileum was normal. - No specimens collected.   03/29/2018 Initial Biopsy   Diagnosis 03/29/18 Stomach, biopsy, Proximal - ADENOCARCINOMA. -  GOBLET CELL METAPLASIA. - SEE COMMENT. Microscopic Comment A Warthin Starry stain is negative for the presence of Helicobacter pylori organisms. Dr Vic Ripper has reviewed the case and concurs with this interpretation. Dr Michail Sermon was paged on 03/31/2018. (JBK:ecj 03/31/2018)   03/29/2018 Cancer Staging   Staging form: Stomach, AJCC 8th Edition - Clinical stage from 03/29/2018: Stage IVA (cT4b, cN1, cM0) - Signed by [name], MD on 04/03/2018   04/02/2018 Initial Diagnosis   Gastric cancer ([place])   04/02/2018 Imaging   CT CAP W Contrast 04/02/18 IMPRESSION: 1. Focal thickening lateral wall proximal stomach with protrusion of low-attenuation soft tissue beyond the expected confines of the gastric wall into the splenic hilum. Imaging features highly concerning for transmural tumor extension into the splenic hilum. Several small nodules in this region are likely lymph nodes, concerning for metastatic disease. 2. Scattered bilateral pulmonary nodules measuring up to 5 mm. Nonspecific, but close attention on follow-up recommended as metastatic disease not excluded. 3. Small lymph nodes in the gastrohepatic ligament, but there is no gastrohepatic or hepato duodenal ligament lymphadenopathy. No evidence for liver metastases. 4.  Aortic Atherosclerois (ICD10-170.0   04/15/2018 - 07/22/2018 Chemotherapy   FLOT4 every 2 weeks starting 04/15/18. She completed 8 cycles on 07/22/18.   08/21/2018 Imaging   CT CAP W Contrast  IMPRESSION: 1. No evidence of residual gastric mass. No abdominopelvic adenopathy. 2. Similar bilateral pulmonary nodules, indeterminate. 3. Development of upper lung and peribronchovascular predominant pulmonary opacities. Differential considerations include drug toxicity or atypical infection. Lymphangitic tumor spread felt less likely but cannot be excluded. Consider chest CT follow-up at 3-6 months. 4.  Aortic Atherosclerosis (ICD10-I70.0).    10/02/2018 Surgery  LAPAROSCOPY  DIAGNOSTIC, TOTAL GASTRECTOMY, FEEDING TUBE PLACEMENT by Dr. 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 - ADENOCARCINOMA IS NOT IDENTIFIED. - THERE IS NO EVIDENCE OF CARCINOMA IN 34 OF 34 LYMPH NODES (0/34). - SEE ONCOLOGY TABLE BELOW.   10/02/2018 Cancer Staging   Staging form: Stomach, AJCC 8th Edition - Pathologic stage from 10/02/2018: ypT0, pN0, cM0 - Signed by Alla Feeling, NP on 01/19/2019   01/20/2019 Survivorship   Per Cira Rue, NP    03/10/2019 Imaging   CT CAP W contrast IMPRESSION: 1. Interval Roux-en-Y esophagojejunostomy with no the definite findings of metastatic disease in the chest, abdomen, or pelvis. 2. Multiple irregular bilateral pulmonary nodules are smaller than on the previous study. This may be infectious/inflammatory etiology but continued close attention on follow-up recommended. 3. Central mesenteric lymph nodes measure upper normal for size and are slightly increased since the preoperative exam. Attention on follow-up recommended. 4.  Aortic Atherosclerois (ICD10-170.0)      CURRENT THERAPY:  Surveillance   INTERVAL HISTORY:  Mariah Kemp is here for a follow up gastric cancer. She was able to identify herself by face to face video. She notes her husband was diagnosed with COVID19 on 03/19/19 so she is quarantining herself currently. She denies having any symptoms. She notes she overall feels fair. She feels weak at times and adjusting her eating. She notes her weight is overall stable. She notes this is manageable overall. She also denies any change in cough or breathing with her COPD.    REVIEW OF SYSTEMS:   Constitutional: Denies fevers, chills or abnormal weight loss  Eyes: Denies blurriness of vision Ears, nose, mouth, throat, and face: Denies mucositis or sore throat Respiratory: Denies cough, dyspnea or  wheezes Cardiovascular: Denies palpitation, chest discomfort or lower extremity swelling Gastrointestinal:  Denies nausea, heartburn or change in bowel habits Skin: Denies abnormal skin rashes Lymphatics: Denies new lymphadenopathy or easy bruising Neurological:Denies numbness, tingling or new weaknesses Behavioral/Psych: Mood is stable, no new changes  All other systems were reviewed with the patient and are negative.  MEDICAL HISTORY:  Past Medical History:  Diagnosis Date  . Asthma   . Cancer ([place])   . Dyspnea    over exertion  . Hypertension   . Hypothyroidism   . Thyroid disease     SURGICAL HISTORY: Past Surgical History:  Procedure Laterality Date  . ABDOMINAL HYSTERECTOMY  1996  . BILATERAL SALPINGOOPHORECTOMY  2007  . BIOPSY  03/29/2018   Procedure: BIOPSY;  Surgeon: Wilford Corner, MD;  Location: [place];  Service: Endoscopy;;  . CARPAL TUNNEL RELEASE    . COLONOSCOPY N/A 03/29/2018   Procedure: COLONOSCOPY;  Surgeon: Wilford Corner, MD;  Location[facility] ENDOSCOPY;  Service: Endoscopy;  Laterality: N/A;  . ESOPHAGOGASTRODUODENOSCOPY N/A 03/29/2018   Procedure: ESOPHAGOGASTRODUODENOSCOPY (EGD);  Surgeon: Wilford Corner, MD;  Location: [place];  Service: Endoscopy;  Laterality: N/A;  . GASTRECTOMY N/A 10/02/2018   Procedure: TOTAL GASTRECTOMY;  Surgeon: [name], MD;  Location: [place];  Service: General;  Laterality: N/A;  . GASTROSTOMY N/A 10/02/2018   Procedure: FEEDING TUBE PLACEMENT;  Surgeon: [name], MD;  Location: [place];  Service: General;  Laterality: N/A;  . LAPAROSCOPY N/A 04/13/2018   Procedure: LAPAROSCOPY DIAGNOSTIC;  Surgeon: [name], MD;  Location: [facility];  Service: General;  Laterality: N/A;  . LAPAROSCOPY N/A 10/02/2018  Procedure: LAPAROSCOPY DIAGNOSTIC;  Surgeon: [name], MD;  Location: [place];  Service: General;  Laterality: N/A;  . MASTECTOMY  2007  . PORTACATH PLACEMENT Left 04/13/2018   Procedure: INSERTION  PORT-A-CATH ERAS PATHWAY;  Surgeon: [name], MD;  Location: [facility];  Service: General;  Laterality: Left;  subclavian    I have reviewed the social history and family history with the patient and they are unchanged from previous note.  ALLERGIES:  has No Known Allergies.  MEDICATIONS:  Current Outpatient Medications  Medication Sig Dispense Refill  . acyclovir (ZOVIRAX) 400 MG tablet Take 1 tablet (400 mg total) by mouth 3 (three) times daily. For five days. As needed for cold sore. (Patient taking differently: Take 400 mg by mouth 3 (three) times daily as needed (cold sores.). ) 15 tablet 0  . albuterol (VENTOLIN HFA) 108 (90 Base) MCG/ACT inhaler Inhale 1-2 puffs into the lungs every 4 (four) hours as needed for wheezing or shortness of breath. 18 g 11  . baclofen (LIORESAL) 10 MG tablet Take 1 tablet (10 mg total) by mouth 4 (four) times daily as needed for muscle spasms. 30 each 0  . budesonide-formoterol (SYMBICORT) 160-4.5 MCG/ACT inhaler Inhale 2 puffs into the lungs 2 (two) times daily. 2 Inhaler 11  . calcium carbonate (TUMS - DOSED IN MG ELEMENTAL CALCIUM) 500 MG chewable tablet Chew 1 tablet (200 mg of elemental calcium total) by mouth 3 (three) times daily. 90 tablet 11  . levothyroxine (SYNTHROID) 150 MCG tablet Take 1 tablet (150 mcg total) by mouth daily before breakfast. Need labs prior to further refills! 90 tablet 0  . lidocaine-prilocaine (EMLA) cream Apply 1 application topically daily as needed (prior to port being accessed).     Marland Kitchen loperamide (IMODIUM) 2 MG capsule Take 1 capsule (2 mg total) by mouth 2 (two) times daily as needed for diarrhea or loose stools. 30 capsule 0  . vitamin B-12 1000 MCG tablet Take 1 tablet (1,000 mcg total) by mouth daily. 30 tablet 0   No current facility-administered medications for this visit.    PHYSICAL EXAMINATION: ECOG PERFORMANCE STATUS: 1 - Symptomatic but completely ambulatory  No vitals taken today, Exam not performed today   LABORATORY DATA:  I have reviewed the data as listed CBC Latest Ref Rng & Units 03/10/2019 02/04/2019 01/20/2019  WBC 4.0 - 10.5 K/uL 5.6 6.0 4.1  Hemoglobin 12.0 - 15.0 g/dL 11.7(L) 11.4(L) 11.0(L)  Hematocrit 36.0 - 46.0 % 33.6(L) 32.5(L) 31.7(L)  Platelets 150 - 400 K/uL 176 175 145(L)     CMP Latest Ref Rng & Units 03/10/2019 02/04/2019 01/20/2019  Glucose 70 - 99 mg/dL 81 74 124(H)  BUN 6 - 20 mg/dL 11 11 10   Creatinine 0.44 - 1.00 mg/dL 0.57 0.62 0.61  Sodium 135 - 145 mmol/L 141 144 141  Potassium 3.5 - 5.1 mmol/L 3.6 3.5 3.3(L)  Chloride 98 - 111 mmol/L 103 106 104  CO2 22 - 32 mmol/L 27 28 27   Calcium 8.9 - 10.3 mg/dL 9.1 8.9 9.2  Total Protein 6.5 - 8.1 g/dL 7.6 7.6 7.5  Total Bilirubin 0.3 - 1.2 mg/dL 0.4 0.3 0.3  Alkaline Phos 38 - 126 U/L 94 93 89  AST 15 - 41 U/L 41 24 24  ALT 0 - 44 U/L 49(H) 25 24      RADIOGRAPHIC STUDIES: I have personally reviewed the radiological images as listed and agreed with the findings in the report. No results found.   ASSESSMENT &  PLAN:  Mariah Kemp is a 58 y.o. female with    1. 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 Given her locally advanced disease, she completed 8 cycles ofneo-adjuvant chemoFLOT4.  -She underwent total Gastrectomy and lymph node dissection and had feeding tube placed on 10/02/18. She had complete pathological response with all nodes negative.  -Given her pCR, I do not recommend more adjuvant radiation or chemotherapy. Will proceed with surveillance.  -I recommend she keep her PAC in place for at least a year given her risk of recurrence. Will continue flushes every 4-8 weeks.  -I personally reviewed and discussed her CT CAP from 03/10/19 which showed no evidence for cancer recurrence. There are other findings of decreased multiple small lung nodules, likely benign from her COPD. Plan to monitor on repeat scan in 6 months.  Will also monitor her mesenteric LN which is slightly enlarged. For her atherosclerosis she can f/u with her PCP.  -I reviewed recent labs which show CBC and CMP WNL except mild and stable anemia and ferritin 501.  -She is still working on her eating, managing weight and gaining strength.  -F/u in 9-10 weeks   2. Weight loss, Mild to moderate Malnutrition, Acid reflux  -With surgery she has feeding tube in place on 10/02/18. She previously switched from Osmolite due to acid reflux and nausea.  -She continues to work on oral eating and managing weight and gaining strength. Weight is currently stable.   3. Anemia, B12 deficiency S/p gastric surgery -Secondary to GI blood loss  -Given 3u blood transfusion on 04/01/18 and s/p 2 doses of IV iron in 03/2018, she responded moderately wellwith improved anemia. -started monthly B12 injections in 01/2019. If not enough may increase injection to weekly.  -She has been taking Geritol for iron. I recommend Prenatal vitamins once daily.  -Mild anemia stable. Ferritin 501 on 03/10/19. Will check B12 level next time.    4. H/o stage 3 right breast cancer in 2007 -Treated with right mastectomy, reconstruction with 3 LN removed. She underwent chemotherapy for 5 months, likely AC-T. She completed Tamoxifen for at least 5 years.   5. Genetic Testing was not pursued for now due to cost  6. HTN andhypothyroidism -We will hold hydrochlorothiazide for now during the chemo -HTNverywell controlled -Continue to follow-up with PCP   PLAN: -CT scan reviewed, NED, we discussed other incidental findings  -Lab, flush, B12 injection on 1/19 and every 4 weeks later.  -f/u in about 10 weeks, will order a repeated CT scan on next visit    No problem-specific Assessment & Plan notes found for this encounter.   No orders of the defined types were placed in this encounter.  I discussed the assessment and treatment plan with the patient. The patient was  provided an opportunity to ask questions and all were answered. The patient agreed with the plan and demonstrated an understanding of the instructions.  The patient was advised to call back or seek an in-person evaluation if the symptoms worsen or if the condition fails to improve as anticipated.   The total time spent in the appointment was 25 minutes.    [name], MD  03/22/2019   I, Joslyn Devon, am acting as scribe for [name], MD.   I have reviewed the above documentation for accuracy and completeness, and I agree with the above.

## 2019-03-18 ENCOUNTER — Telehealth: Payer: Self-pay | Admitting: Hematology

## 2019-03-18 NOTE — Telephone Encounter (Signed)
Called patient to switch follow-up visit to a virtual visit on 01/04 per providers request, patient is notified.

## 2019-03-19 DIAGNOSIS — C162 Malignant neoplasm of body of stomach: Secondary | ICD-10-CM | POA: Diagnosis not present

## 2019-03-22 ENCOUNTER — Encounter: Payer: Self-pay | Admitting: Hematology

## 2019-03-22 ENCOUNTER — Inpatient Hospital Stay: Payer: BC Managed Care – PPO | Attending: Hematology | Admitting: Hematology

## 2019-03-22 DIAGNOSIS — C162 Malignant neoplasm of body of stomach: Secondary | ICD-10-CM

## 2019-03-22 DIAGNOSIS — I10 Essential (primary) hypertension: Secondary | ICD-10-CM | POA: Diagnosis not present

## 2019-03-22 DIAGNOSIS — E538 Deficiency of other specified B group vitamins: Secondary | ICD-10-CM

## 2019-04-06 ENCOUNTER — Inpatient Hospital Stay: Payer: BC Managed Care – PPO

## 2019-04-06 ENCOUNTER — Encounter: Payer: Self-pay | Admitting: Gastroenterology

## 2019-04-06 ENCOUNTER — Ambulatory Visit (INDEPENDENT_AMBULATORY_CARE_PROVIDER_SITE_OTHER): Payer: BC Managed Care – PPO | Admitting: Gastroenterology

## 2019-04-06 ENCOUNTER — Ambulatory Visit: Payer: Self-pay

## 2019-04-06 ENCOUNTER — Other Ambulatory Visit: Payer: Self-pay

## 2019-04-06 VITALS — BP 114/78 | HR 76 | Temp 97.7°F | Ht 61.0 in | Wt 127.1 lb

## 2019-04-06 DIAGNOSIS — R142 Eructation: Secondary | ICD-10-CM | POA: Diagnosis not present

## 2019-04-06 DIAGNOSIS — E538 Deficiency of other specified B group vitamins: Secondary | ICD-10-CM

## 2019-04-06 DIAGNOSIS — C169 Malignant neoplasm of stomach, unspecified: Secondary | ICD-10-CM

## 2019-04-06 DIAGNOSIS — R1310 Dysphagia, unspecified: Secondary | ICD-10-CM | POA: Diagnosis not present

## 2019-04-06 DIAGNOSIS — D500 Iron deficiency anemia secondary to blood loss (chronic): Secondary | ICD-10-CM

## 2019-04-06 DIAGNOSIS — R1319 Other dysphagia: Secondary | ICD-10-CM

## 2019-04-06 DIAGNOSIS — C162 Malignant neoplasm of body of stomach: Secondary | ICD-10-CM | POA: Diagnosis not present

## 2019-04-06 DIAGNOSIS — Z95828 Presence of other vascular implants and grafts: Secondary | ICD-10-CM

## 2019-04-06 DIAGNOSIS — Z903 Acquired absence of stomach [part of]: Secondary | ICD-10-CM | POA: Diagnosis not present

## 2019-04-06 LAB — CBC WITH DIFFERENTIAL (CANCER CENTER ONLY)
Abs Immature Granulocytes: 0.01 10*3/uL (ref 0.00–0.07)
Basophils Absolute: 0 10*3/uL (ref 0.0–0.1)
Basophils Relative: 1 %
Eosinophils Absolute: 0.2 10*3/uL (ref 0.0–0.5)
Eosinophils Relative: 5 %
HCT: 33 % — ABNORMAL LOW (ref 36.0–46.0)
Hemoglobin: 11.4 g/dL — ABNORMAL LOW (ref 12.0–15.0)
Immature Granulocytes: 0 %
Lymphocytes Relative: 41 %
Lymphs Abs: 2.1 10*3/uL (ref 0.7–4.0)
MCH: 33.1 pg (ref 26.0–34.0)
MCHC: 34.5 g/dL (ref 30.0–36.0)
MCV: 95.9 fL (ref 80.0–100.0)
Monocytes Absolute: 0.3 10*3/uL (ref 0.1–1.0)
Monocytes Relative: 7 %
Neutro Abs: 2.4 10*3/uL (ref 1.7–7.7)
Neutrophils Relative %: 46 %
Platelet Count: 162 10*3/uL (ref 150–400)
RBC: 3.44 MIL/uL — ABNORMAL LOW (ref 3.87–5.11)
RDW: 13.1 % (ref 11.5–15.5)
WBC Count: 5.1 10*3/uL (ref 4.0–10.5)
nRBC: 0 % (ref 0.0–0.2)

## 2019-04-06 LAB — CMP (CANCER CENTER ONLY)
ALT: 41 U/L (ref 0–44)
AST: 31 U/L (ref 15–41)
Albumin: 3.8 g/dL (ref 3.5–5.0)
Alkaline Phosphatase: 84 U/L (ref 38–126)
Anion gap: 8 (ref 5–15)
BUN: 8 mg/dL (ref 6–20)
CO2: 27 mmol/L (ref 22–32)
Calcium: 8.7 mg/dL — ABNORMAL LOW (ref 8.9–10.3)
Chloride: 106 mmol/L (ref 98–111)
Creatinine: 0.59 mg/dL (ref 0.44–1.00)
GFR, Est AFR Am: >60 mL/min (ref >60)
GFR, Estimated: >60 mL/min (ref >60)
Glucose, Bld: 75 mg/dL (ref 70–99)
Potassium: 3.7 mmol/L (ref 3.5–5.1)
Sodium: 141 mmol/L (ref 135–145)
Total Bilirubin: 0.3 mg/dL (ref 0.3–1.2)
Total Protein: 7.2 g/dL (ref 6.5–8.1)

## 2019-04-06 LAB — FERRITIN: Ferritin: 292 ng/mL (ref 11–307)

## 2019-04-06 LAB — VITAMIN B12: Vitamin B-12: >7500 pg/mL — ABNORMAL HIGH (ref 180–914)

## 2019-04-06 MED ORDER — HEPARIN SOD (PORK) LOCK FLUSH 100 UNIT/ML IV SOLN
500.0000 [IU] | Freq: Once | INTRAVENOUS | Status: AC
  Administered 2019-04-06: 500 [IU]
  Filled 2019-04-06: qty 5

## 2019-04-06 MED ORDER — SODIUM CHLORIDE 0.9% FLUSH
10.0000 mL | Freq: Once | INTRAVENOUS | Status: AC
  Administered 2019-04-06: 10 mL
  Filled 2019-04-06: qty 10

## 2019-04-06 MED ORDER — CYANOCOBALAMIN 1000 MCG/ML IJ SOLN
1000.0000 ug | Freq: Once | INTRAMUSCULAR | Status: AC
  Administered 2019-04-06: 1000 ug via INTRAMUSCULAR

## 2019-04-06 MED ORDER — CYANOCOBALAMIN 1000 MCG/ML IJ SOLN
INTRAMUSCULAR | Status: AC
  Filled 2019-04-06: qty 1

## 2019-04-06 NOTE — Progress Notes (Signed)
[facility] Consult Note:  History: Mariah Kemp 04/06/2019  Referring provider: [name], MD  Reason for consult/chief complaint: Dysphagia (feels like things are getting stuck and takes a while to go down) and Gas (constant belching when eating)   Subjective  HPI:  This is a very pleasant 59 year old woman referred by her surgeon for dysphagia and belching.  March 22, 2019 office note with Dr. Burr Medico including the following: " ROS:1. 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 Given her locally advanced disease, she completed 8 cycles of neo-adjuvant chemo FLOT4.  -She underwent total Gastrectomy and lymph node dissection and had feeding tube placed on 10/02/18. She had complete pathological response with all nodes negative.  -Given her pCR, I do not recommend more adjuvant radiation or chemotherapy. Will proceed with surveillance.  -I recommend she keep her PAC in place for at least a year given her risk of recurrence. Will continue flushes every 4-8 weeks.  -I personally reviewed and discussed her CT CAP from 03/10/19 which showed no evidence for cancer recurrence. There are other findings of decreased multiple small lung nodules, likely benign from her COPD. Plan to monitor on repeat scan in 6 months. Will also monitor her mesenteric LN which is slightly enlarged. For her atherosclerosis she can f/u with her PCP.  -I reviewed recent labs which show CBC and CMP WNL except mild and stable anemia and ferritin 501.  -She is still working on her eating, managing weight and gaining strength.  -F/u in 9-10 weeks    2. Weight loss, Mild to moderate Malnutrition, Acid reflux  -With surgery she has feeding tube in place on 10/02/18. She previously switched from Osmolite due to acid reflux and nausea.  -She continues to work on oral eating and managing weight and gaining strength. Weight  is currently stable.    3. Anemia, B12 deficiency S/p gastric surgery -Secondary to GI blood loss  -Given 3u blood transfusion on 04/01/18 and s/p 2 doses of IV iron in 03/2018, she responded moderately well with improved anemia. -started monthly B12 injections in 01/2019. If not enough may increase injection to weekly.  -She has been taking Geritol for iron. I recommend Prenatal vitamins once daily.  -Mild anemia stable. Ferritin 501 on 03/10/19. Will check B12 level next time. "  ________________________________________  Ever since her surgery last year, she has had difficulty eating, where food passes very slowly, feels like it is stuck in the lower sternum, then she has frequent belching that will last 15 to 20 minutes after meals.  It will eventually subside when it feels as if the food passes.  There was some concern she may have a stricture, so upper GI series was recently done and report as below. While these are still persistent and embarrassing symptoms, they have subsided somewhat since what they were initially after the surgery. She has adapted to a new pattern of eating, has been able to maintain her weight and feeding tube was removed last year.  Review of Systems  Constitutional: Positive for fatigue. Negative for appetite change and unexpected weight change.  HENT: Negative for mouth sores and voice change.   Eyes: Negative for pain and redness.  Respiratory: Negative for cough and shortness of breath.   Cardiovascular: Negative for chest pain and palpitations.  Genitourinary: Negative for dysuria and hematuria.  Musculoskeletal: Negative for arthralgias and myalgias.  Skin: Negative for pallor and rash.  Neurological: Negative for weakness and headaches.  Hematological: Negative for adenopathy.     Past Medical History: Past Medical History:  Diagnosis Date  . Anemia   . Asthma   . Breast cancer ([place])   . Dyspnea    over exertion  . Hypertension   . Hypothyroidism    . Stomach cancer [facility])      Past Surgical History: Past Surgical History:  Procedure Laterality Date  . ABDOMINAL HYSTERECTOMY  1996  . BARIATRIC SURGERY    . BILATERAL SALPINGOOPHORECTOMY  2007  . BIOPSY  03/29/2018   Procedure: BIOPSY;  Surgeon: Wilford Corner, MD;  Location: [facility];  Service: Endoscopy;;  . CARPAL TUNNEL RELEASE    . COLONOSCOPY N/A 03/29/2018   Procedure: COLONOSCOPY;  Surgeon: Wilford Corner, MD;  Location: [facility]OSCOPY;  Service: Endoscopy;  Laterality: N/A;  . ESOPHAGOGASTRODUODENOSCOPY N/A 03/29/2018   Procedure: ESOPHAGOGASTRODUODENOSCOPY (EGD);  Surgeon: Wilford Corner, MD;  Location: [place];  Service: Endoscopy;  Laterality: N/A;  . GASTRECTOMY N/A 10/02/2018   Procedure: TOTAL GASTRECTOMY;  Surgeon: [name], MD;  Location: [place];  Service: General;  Laterality: N/A;  . GASTROSTOMY N/A 10/02/2018   Procedure: FEEDING TUBE PLACEMENT;  Surgeon: [name], MD;  Location: [place];  Service: General;  Laterality: N/A;  . LAPAROSCOPY N/A 04/13/2018   Procedure: LAPAROSCOPY DIAGNOSTIC;  Surgeon: [name], MD;  Location: [facility];  Service: General;  Laterality: N/A;  . LAPAROSCOPY N/A 10/02/2018   Procedure: LAPAROSCOPY DIAGNOSTIC;  Surgeon: [name], MD;  Location: [place];  Service: General;  Laterality: N/A;  . MASTECTOMY  2007  . PORTACATH PLACEMENT Left 04/13/2018   Procedure: INSERTION PORT-A-CATH ERAS PATHWAY;  Surgeon: [name], MD;  Location: [facility];  Service: General;  Laterality: Left;  subclavian     Family History: Family History  Problem Relation Age of Onset  . Liver cancer Father   . Cancer Paternal Uncle        unknown type cancer  . Cirrhosis Mother     Social History: Social History   Socioeconomic History  . Marital status: Married    Spouse name: Not on file  . Number of children: 2  . Years of education: Not on file  . Highest education level: Not on file  Occupational History  .  Occupation: Physiological scientist  Tobacco Use  . Smoking status: Former Smoker    Packs/day: 0.50    Years: 15.00    Pack years: 7.50    Quit date: 04/27/1994    Years since quitting: 24.9  . Smokeless tobacco: Never Used  Substance and Sexual Activity  . Alcohol use: No    Alcohol/week: 0.0 standard drinks  . Drug use: No  . Sexual activity: Never    Partners: Male  Other Topics Concern  . Not on file  Social History Narrative  . Not on file   Social Determinants of Health   Financial Resource Strain:   . Difficulty of Paying Living Expenses: Not on file  Food Insecurity:   . Worried About Charity fundraiser in the Last Year: Not on file  . Ran Out of Food in the Last Year: Not on file  Transportation Needs:   . Lack of Transportation (Medical): Not on file  . Lack of Transportation (Non-Medical): Not on file  Physical Activity:   . Days of Exercise per Week: Not on file  . Minutes of Exercise per Session: Not on file  Stress:   .  Feeling of Stress : Not on file  Social Connections:   . Frequency of Communication with Friends and Family: Not on file  . Frequency of Social Gatherings with Friends and Family: Not on file  . Attends Religious Services: Not on file  . Active Member of Clubs or Organizations: Not on file  . Attends Archivist Meetings: Not on file  . Marital Status: Not on file    Allergies: No Known Allergies  Outpatient Meds: Current Outpatient Medications  Medication Sig Dispense Refill  . acyclovir (ZOVIRAX) 400 MG tablet Take 1 tablet (400 mg total) by mouth 3 (three) times daily. For five days. As needed for cold sore. (Patient taking differently: Take 400 mg by mouth 3 (three) times daily as needed (cold sores.). ) 15 tablet 0  . albuterol (VENTOLIN HFA) 108 (90 Base) MCG/ACT inhaler Inhale 1-2 puffs into the lungs every 4 (four) hours as needed for wheezing or shortness of breath. 18 g 11  . baclofen (LIORESAL) 10 MG tablet Take 1  tablet (10 mg total) by mouth 4 (four) times daily as needed for muscle spasms. 30 each 0  . budesonide-formoterol (SYMBICORT) 160-4.5 MCG/ACT inhaler Inhale 2 puffs into the lungs 2 (two) times daily. 2 Inhaler 11  . calcium carbonate (TUMS - DOSED IN MG ELEMENTAL CALCIUM) 500 MG chewable tablet Chew 1 tablet (200 mg of elemental calcium total) by mouth 3 (three) times daily. 90 tablet 11  . levothyroxine (SYNTHROID) 150 MCG tablet Take 1 tablet (150 mcg total) by mouth daily before breakfast. Need labs prior to further refills! 90 tablet 0  . lidocaine-prilocaine (EMLA) cream Apply 1 application topically daily as needed (prior to port being accessed).     Marland Kitchen loperamide (IMODIUM) 2 MG capsule Take 1 capsule (2 mg total) by mouth 2 (two) times daily as needed for diarrhea or loose stools. 30 capsule 0  . vitamin B-12 1000 MCG tablet Take 1 tablet (1,000 mcg total) by mouth daily. 30 tablet 0   No current facility-administered medications for this visit.      ___________________________________________________________________ Objective   Exam:  BP 114/78 (BP Location: Left Arm, Patient Position: Sitting, Cuff Size: Normal)   Pulse 76   Temp 97.7 F (36.5 C)   Ht 5\' 1"  (1.549 m) Comment: height measured without shoes  Wt 127 lb 2 oz (57.7 kg)   BMI 24.02 kg/m    General: Well-appearing, pleasant and conversational, normal vocal quality  Eyes: sclera anicteric, no redness  ENT: oral mucosa moist without lesions, no cervical or supraclavicular lymphadenopathy  CV: RRR without murmur, S1/S2, no JVD, no peripheral edema  Resp: clear to auscultation bilaterally, normal RR and effort noted  GI: soft, no tenderness, with active bowel sounds. No guarding or palpable organomegaly noted.  Long midline scar, also jejunostomy site scar in left upper quadrant  Skin; warm and dry, no rash or jaundice noted  Neuro: awake, alert and oriented x 3. Normal gross motor function and fluent  speech   Radiologic Studies:  Most recent CT abdoCLINICAL DATA:  Status post gastrectomy with gastrojejunal anastomosis.   EXAM: UPPER GI SERIES WITH KUB   TECHNIQUE: After obtaining a scout radiograph a routine upper GI series was performed using thin barium.   FLUOROSCOPY TIME:  Fluoroscopy Time:  2 minutes and 18 seconds.   Radiation Exposure Index (if provided by the fluoroscopic device): 154 mGy   Number of Acquired Spot Images:   COMPARISON:  10/16/2018   FINDINGS:  Pre-procedure KUB shows numerous surgical clips overlying the central abdomen and pelvis. Bowel gas pattern is normal.   Frontal and lateral views of the hypopharynx while swallowing thick barium are normal. Single contrast evaluation of the esophagus reveals no diverticulum, stricture, mass lesion, or gross mucosal ulceration. While the patient was in an upright position, 13 mm barium tablet was given with water. The barium tablet passes readily through the anastomosis into the jejunum, but did reflux back up into the distal esophagus while the patient was upright. When the tablet refluxed back into the distal esophagus, the patient stated that she felt like the tablet was stuck at this location and that these with the symptoms she is having at home.   Patient was subsequently placed RAO prone on the horizontal fluoro table and monitored fluoroscopically while swallowing. There is good preservation of primary peristalsis without tertiary contractions or presbyesophagus. No evidence for herniation of small bowel into the lower thorax. Of note, while the patient was in this position, the barium tablet was observed refluxing back and forth through the esophagojejunostomy.   There is brisk migration of contrast down the efferent the limb which is nondilated and shows normal mucosal fold thickness. There is no evidence of a narrowing or stricture in the limb that would obstruct passage of the barium  tablet.   IMPRESSION: 1. Expected postoperative appearance in this patient status post gastrectomy with esophagojejunal anastomosis. 2. No esophageal stricture or diverticulum. No gross mucosal ulceration. Esophageal motility is normal. 3. Normal appearance of the efferent limb. 4. 13 mm barium tablet passed readily through the anastomosis but did reflux back and forth through the anastomosis for a period of time after it was swallowed.     Electronically Signed   By: Misty Stanley M.D.   On: 02/26/2019 11:47 CLINICAL DATA:  Status post gastrectomy with gastrojejunal anastomosis.   EXAM: UPPER GI SERIES WITH KUB   TECHNIQUE: After obtaining a scout radiograph a routine upper GI series was performed using thin barium.   FLUOROSCOPY TIME:  Fluoroscopy Time:  2 minutes and 18 seconds.   Radiation Exposure Index (if provided by the fluoroscopic device): 154 mGy   Number of Acquired Spot Images:   COMPARISON:  10/16/2018   FINDINGS: Pre-procedure KUB shows numerous surgical clips overlying the central abdomen and pelvis. Bowel gas pattern is normal.   Frontal and lateral views of the hypopharynx while swallowing thick barium are normal. Single contrast evaluation of the esophagus reveals no diverticulum, stricture, mass lesion, or gross mucosal ulceration. While the patient was in an upright position, 13 mm barium tablet was given with water. The barium tablet passes readily through the anastomosis into the jejunum, but did reflux back up into the distal esophagus while the patient was upright. When the tablet refluxed back into the distal esophagus, the patient stated that she felt like the tablet was stuck at this location and that these with the symptoms she is having at home.   Patient was subsequently placed RAO prone on the horizontal fluoro table and monitored fluoroscopically while swallowing. There is good preservation of primary peristalsis without tertiary  contractions or presbyesophagus. No evidence for herniation of small bowel into the lower thorax. Of note, while the patient was in this position, the barium tablet was observed refluxing back and forth through the esophagojejunostomy.   There is brisk migration of contrast down the efferent the limb which is nondilated and shows normal mucosal fold thickness. There is no evidence of  a narrowing or stricture in the limb that would obstruct passage of the barium tablet.   IMPRESSION: 1. Expected postoperative appearance in this patient status post gastrectomy with esophagojejunal anastomosis. 2. No esophageal stricture or diverticulum. No gross mucosal ulceration. Esophageal motility is normal. 3. Normal appearance of the efferent limb. 4. 13 mm barium tablet passed readily through the anastomosis but did reflux back and forth through the anastomosis for a period of time after it was swallowed.     Electronically Signed   By: Misty Stanley M.D.   On: 02/26/2019 11:47 men and pelvis report December 2020 reviewed.   Assessment: Encounter Diagnoses  Name Primary?  . Esophageal dysphagia Yes  . Belching   . S/P gastrectomy     Persistent dysphagia and cyclical belching postoperatively, status post total gastrectomy with esophagojejunostomy last year for malignancy. Recent imaging shows that there is no stricture, thus not a mechanical cause for symptoms. We had a long discussion about the nature of motility changes in the esophagus and/or the small bowel after this kind of surgery. This does not appear amenable to any particular medicines.  It requires indefinite dietary changes, need to stay upright after meals.  Some of it may continue to get better slowly as more time passes since the surgery.  Total time 40 minutes, over half spent face-to-face with patient.  Thank you for the courtesy of this consult.  Please call me with any questions or concerns.  Nelida Meuse  III  CC: Referring provider noted above Also Emeterio Reeve, DO

## 2019-04-06 NOTE — Patient Instructions (Signed)
If you are age 58 or older, your body mass index should be between 23-30. Your Body mass index is 24.02 kg/m. If this is out of the aforementioned range listed, please consider follow up with your Primary Care Provider.  If you are age 67 or younger, your body mass index should be between 19-25. Your Body mass index is 24.02 kg/m. If this is out of the aformentioned range listed, please consider follow up with your Primary Care Provider.   Follow up as needed! [phone]   It was a pleasure to see you today!  Dr. Loletha Carrow

## 2019-04-06 NOTE — Progress Notes (Signed)
Gave b12 injection during flush appointment

## 2019-04-06 NOTE — Patient Instructions (Signed)

## 2019-04-07 ENCOUNTER — Telehealth: Payer: Self-pay

## 2019-04-07 LAB — CANCER ANTIGEN 19-9: CA 19-9: 7 U/mL (ref 0–35)

## 2019-04-07 NOTE — Telephone Encounter (Signed)
Rosann Auerbach called from Lab:  B 12 level > 7,500.  Test was run twice. Dr Burr Medico notified. No new orders.

## 2019-04-10 LAB — METHYLMALONIC ACID, SERUM: Methylmalonic Acid, Quantitative: 201 nmol/L (ref 0–378)

## 2019-04-13 DIAGNOSIS — C162 Malignant neoplasm of body of stomach: Secondary | ICD-10-CM | POA: Diagnosis not present

## 2019-04-19 DIAGNOSIS — C162 Malignant neoplasm of body of stomach: Secondary | ICD-10-CM | POA: Diagnosis not present

## 2019-05-03 ENCOUNTER — Encounter: Payer: Self-pay | Admitting: Osteopathic Medicine

## 2019-05-05 MED ORDER — AIRDUO DIGIHALER 113-14 MCG/ACT IN AEPB: 1.0000 | INHALATION_SPRAY | Freq: Two times a day (BID) | RESPIRATORY_TRACT | 6 refills | Status: DC

## 2019-05-06 ENCOUNTER — Telehealth: Payer: Self-pay | Admitting: Emergency Medicine

## 2019-05-06 NOTE — Telephone Encounter (Signed)
Called pt regarding lab/flush/injection appts on 2/19 starting at 0900.  Pt agreed to move lab appt to 1230 with flush & injections appts following that d/t weather.  Scheduling message sent to move lab appt, flush & injection appt changed.

## 2019-05-07 ENCOUNTER — Inpatient Hospital Stay: Payer: BC Managed Care – PPO

## 2019-05-07 ENCOUNTER — Inpatient Hospital Stay: Payer: BC Managed Care – PPO | Attending: Hematology

## 2019-05-07 ENCOUNTER — Other Ambulatory Visit: Payer: BC Managed Care – PPO

## 2019-05-07 ENCOUNTER — Other Ambulatory Visit: Payer: Self-pay

## 2019-05-07 VITALS — BP 130/74 | HR 67 | Temp 98.2°F | Resp 18

## 2019-05-07 DIAGNOSIS — E538 Deficiency of other specified B group vitamins: Secondary | ICD-10-CM | POA: Diagnosis not present

## 2019-05-07 DIAGNOSIS — Z95828 Presence of other vascular implants and grafts: Secondary | ICD-10-CM

## 2019-05-07 DIAGNOSIS — K922 Gastrointestinal hemorrhage, unspecified: Secondary | ICD-10-CM | POA: Insufficient documentation

## 2019-05-07 DIAGNOSIS — D500 Iron deficiency anemia secondary to blood loss (chronic): Secondary | ICD-10-CM

## 2019-05-07 DIAGNOSIS — C169 Malignant neoplasm of stomach, unspecified: Secondary | ICD-10-CM

## 2019-05-07 DIAGNOSIS — C162 Malignant neoplasm of body of stomach: Secondary | ICD-10-CM | POA: Insufficient documentation

## 2019-05-07 LAB — CMP (CANCER CENTER ONLY)
ALT: 46 U/L — ABNORMAL HIGH (ref 0–44)
AST: 32 U/L (ref 15–41)
Albumin: 3.6 g/dL (ref 3.5–5.0)
Alkaline Phosphatase: 87 U/L (ref 38–126)
Anion gap: 9 (ref 5–15)
BUN: 11 mg/dL (ref 6–20)
CO2: 26 mmol/L (ref 22–32)
Calcium: 9.3 mg/dL (ref 8.9–10.3)
Chloride: 104 mmol/L (ref 98–111)
Creatinine: 0.62 mg/dL (ref 0.44–1.00)
GFR, Est AFR Am: >60 mL/min (ref >60)
GFR, Estimated: >60 mL/min (ref >60)
Glucose, Bld: 147 mg/dL — ABNORMAL HIGH (ref 70–99)
Potassium: 3.7 mmol/L (ref 3.5–5.1)
Sodium: 139 mmol/L (ref 135–145)
Total Bilirubin: 0.2 mg/dL — ABNORMAL LOW (ref 0.3–1.2)
Total Protein: 7.7 g/dL (ref 6.5–8.1)

## 2019-05-07 LAB — CBC WITH DIFFERENTIAL (CANCER CENTER ONLY)
Abs Immature Granulocytes: 0 10*3/uL (ref 0.00–0.07)
Basophils Absolute: 0 10*3/uL (ref 0.0–0.1)
Basophils Relative: 1 %
Eosinophils Absolute: 0.4 10*3/uL (ref 0.0–0.5)
Eosinophils Relative: 8 %
HCT: 32.8 % — ABNORMAL LOW (ref 36.0–46.0)
Hemoglobin: 11.3 g/dL — ABNORMAL LOW (ref 12.0–15.0)
Immature Granulocytes: 0 %
Lymphocytes Relative: 38 %
Lymphs Abs: 1.9 10*3/uL (ref 0.7–4.0)
MCH: 33.2 pg (ref 26.0–34.0)
MCHC: 34.5 g/dL (ref 30.0–36.0)
MCV: 96.5 fL (ref 80.0–100.0)
Monocytes Absolute: 0.4 10*3/uL (ref 0.1–1.0)
Monocytes Relative: 7 %
Neutro Abs: 2.2 10*3/uL (ref 1.7–7.7)
Neutrophils Relative %: 46 %
Platelet Count: 169 10*3/uL (ref 150–400)
RBC: 3.4 MIL/uL — ABNORMAL LOW (ref 3.87–5.11)
RDW: 13 % (ref 11.5–15.5)
WBC Count: 4.9 10*3/uL (ref 4.0–10.5)
nRBC: 0 % (ref 0.0–0.2)

## 2019-05-07 LAB — FERRITIN: Ferritin: <4 ng/mL — ABNORMAL LOW (ref 11–307)

## 2019-05-07 MED ORDER — HEPARIN SOD (PORK) LOCK FLUSH 100 UNIT/ML IV SOLN
500.0000 [IU] | Freq: Once | INTRAVENOUS | Status: AC
  Administered 2019-05-07: 500 [IU]
  Filled 2019-05-07: qty 5

## 2019-05-07 MED ORDER — HEPARIN SOD (PORK) LOCK FLUSH 100 UNIT/ML IV SOLN
250.0000 [IU] | Freq: Once | INTRAVENOUS | Status: DC | PRN
  Filled 2019-05-07: qty 5

## 2019-05-07 MED ORDER — SODIUM CHLORIDE 0.9% FLUSH
3.0000 mL | Freq: Once | INTRAVENOUS | Status: DC | PRN
  Filled 2019-05-07: qty 10

## 2019-05-07 MED ORDER — SODIUM CHLORIDE 0.9% FLUSH
10.0000 mL | Freq: Once | INTRAVENOUS | Status: AC
  Administered 2019-05-07: 13:00:00 10 mL
  Filled 2019-05-07: qty 10

## 2019-05-07 MED ORDER — CYANOCOBALAMIN 1000 MCG/ML IJ SOLN
INTRAMUSCULAR | Status: AC
  Filled 2019-05-07: qty 1

## 2019-05-07 MED ORDER — ALTEPLASE 2 MG IJ SOLR
2.0000 mg | Freq: Once | INTRAMUSCULAR | Status: DC | PRN
  Filled 2019-05-07: qty 2

## 2019-05-07 MED ORDER — SODIUM CHLORIDE 0.9 % IV SOLN: 510.0000 mg | Freq: Once | INTRAVENOUS | Status: DC

## 2019-05-07 MED ORDER — CYANOCOBALAMIN 1000 MCG/ML IJ SOLN
1000.0000 ug | Freq: Once | INTRAMUSCULAR | Status: AC
  Administered 2019-05-07: 1000 ug via INTRAMUSCULAR

## 2019-05-07 NOTE — Patient Instructions (Signed)
Cyanocobalamin, Pyridoxine, and Folate What is this medicine? A multivitamin containing folic acid, vitamin B6, and vitamin B12. This medicine may be used for other purposes; ask your health care provider or pharmacist if you have questions. COMMON BRAND NAME(S): AllanFol RX, AllanTex, Av-Vite FB, B Complex with Folic Acid, ComBgen, FaBB, Folamin, Folastin, Folbalin, Folbee, Folbic, Folcaps, Folgard, Folgard RX, Folgard RX 2.2, Folplex, Folplex 2.2, Foltabs 800, Foltx, Homocysteine Formula, Niva-Fol, NuFol, TL Gard RX, Virt-Gard, Virt-Vite, Virt-Vite Forte, Vita-Respa What should I tell my health care provider before I take this medicine? They need to know if you have any of these conditions:  bleeding or clotting disorder  history of anemia of any type  other chronic health condition  an unusual or allergic reaction to vitamins, other medicines, foods, dyes, or preservatives  pregnant or trying to get pregnant  breast-feeding How should I use this medicine? Take by mouth with a glass of water. May take with food. Follow the directions on the prescription label. It is usually given once a day. Do not take your medicine more often than directed. Contact your pediatrician regarding the use of this medicine in children. Special care may be needed. Overdosage: If you think you have taken too much of this medicine contact a poison control center or emergency room at once. NOTE: This medicine is only for you. Do not share this medicine with others. What if I miss a dose? If you miss a dose, take it as soon as you can. If it is almost time for your next dose, take only that dose. Do not take double or extra doses. What may interact with this medicine?  levodopa This list may not describe all possible interactions. Give your health care provider a list of all the medicines, herbs, non-prescription drugs, or dietary supplements you use. Also tell them if you smoke, drink alcohol, or use illegal  drugs. Some items may interact with your medicine. What should I watch for while using this medicine? See your health care professional for regular checks on your progress. Remember that vitamin supplements do not replace the need for good nutrition from a balanced diet. What side effects may I notice from receiving this medicine? Side effects that you should report to your doctor or health care professional as soon as possible:  allergic reaction such as skin rash or difficulty breathing  vomiting Side effects that usually do not require medical attention (report to your doctor or health care professional if they continue or are bothersome):  nausea  stomach upset This list may not describe all possible side effects. Call your doctor for medical advice about side effects. You may report side effects to FDA at 1-800-FDA-1088. Where should I keep my medicine? Keep out of the reach of children. Most vitamins should be stored at controlled room temperature. Check your specific product directions. Protect from heat and moisture. Throw away any unused medicine after the expiration date. NOTE: This sheet is a summary. It may not cover all possible information. If you have questions about this medicine, talk to your doctor, pharmacist, or health care provider.  2020 Elsevier/Gold Standard (2007-04-25 00:59:55)  

## 2019-05-08 ENCOUNTER — Other Ambulatory Visit: Payer: Self-pay | Admitting: Hematology

## 2019-05-08 ENCOUNTER — Encounter: Payer: Self-pay | Admitting: Hematology

## 2019-05-08 LAB — CANCER ANTIGEN 19-9: CA 19-9: 9 U/mL (ref 0–35)

## 2019-05-10 ENCOUNTER — Telehealth: Payer: Self-pay | Admitting: Hematology

## 2019-05-10 ENCOUNTER — Encounter: Payer: Self-pay | Admitting: Osteopathic Medicine

## 2019-05-10 NOTE — Telephone Encounter (Signed)
Scheduled appt per 2/20 sch message - unable to reach pt . Left message with appt date and time

## 2019-05-11 MED ORDER — AIRDUO DIGIHALER 113-14 MCG/ACT IN AEPB: 1.0000 | INHALATION_SPRAY | Freq: Two times a day (BID) | RESPIRATORY_TRACT | 6 refills | Status: DC

## 2019-05-13 ENCOUNTER — Telehealth: Payer: Self-pay

## 2019-05-13 NOTE — Telephone Encounter (Signed)
Otila Kluver from [facility] left a vm msg yesterday stating airduo rx is not covered by pt's insurance. Pharmacist mentioned that pt was under the impression that generic advair was going to be sent instead to the pharmacy. Pls advise, thanks.

## 2019-05-14 ENCOUNTER — Inpatient Hospital Stay: Payer: BC Managed Care – PPO

## 2019-05-14 ENCOUNTER — Other Ambulatory Visit: Payer: Self-pay

## 2019-05-14 VITALS — BP 123/77 | HR 68 | Temp 98.1°F | Resp 16

## 2019-05-14 DIAGNOSIS — E538 Deficiency of other specified B group vitamins: Secondary | ICD-10-CM | POA: Diagnosis not present

## 2019-05-14 DIAGNOSIS — K922 Gastrointestinal hemorrhage, unspecified: Secondary | ICD-10-CM | POA: Diagnosis not present

## 2019-05-14 DIAGNOSIS — Z95828 Presence of other vascular implants and grafts: Secondary | ICD-10-CM

## 2019-05-14 DIAGNOSIS — D500 Iron deficiency anemia secondary to blood loss (chronic): Secondary | ICD-10-CM

## 2019-05-14 DIAGNOSIS — C162 Malignant neoplasm of body of stomach: Secondary | ICD-10-CM | POA: Diagnosis not present

## 2019-05-14 MED ORDER — SODIUM CHLORIDE 0.9 % IV SOLN
INTRAVENOUS | Status: DC
  Filled 2019-05-14: qty 250

## 2019-05-14 MED ORDER — ADVAIR HFA 115-21 MCG/ACT IN AERO: 2.0000 | INHALATION_SPRAY | Freq: Two times a day (BID) | RESPIRATORY_TRACT | 99 refills | Status: DC

## 2019-05-14 MED ORDER — HEPARIN SOD (PORK) LOCK FLUSH 100 UNIT/ML IV SOLN
500.0000 [IU] | Freq: Once | INTRAVENOUS | Status: AC
  Administered 2019-05-14: 09:00:00 500 [IU]
  Filled 2019-05-14: qty 5

## 2019-05-14 MED ORDER — SODIUM CHLORIDE 0.9 % IV SOLN
510.0000 mg | Freq: Once | INTRAVENOUS | Status: AC
  Administered 2019-05-14: 09:00:00 510 mg via INTRAVENOUS
  Filled 2019-05-14: qty 510

## 2019-05-14 MED ORDER — SODIUM CHLORIDE 0.9% FLUSH
10.0000 mL | Freq: Once | INTRAVENOUS | Status: AC
  Administered 2019-05-14: 09:00:00 10 mL
  Filled 2019-05-14: qty 10

## 2019-05-14 NOTE — Progress Notes (Signed)
Patient declined to stay 30 minutes post observation. VSS.

## 2019-05-14 NOTE — Telephone Encounter (Signed)
Called patient and notified Advair sent to pharmacy. KG LPN

## 2019-05-14 NOTE — Patient Instructions (Signed)

## 2019-05-14 NOTE — Telephone Encounter (Signed)
Patient had specifically requested the air duo.  I will send the Advair, if there are any further issues then this patient needs a visit to discuss it further or pharmacy needs to send an electronic request for something that is covered

## 2019-05-17 DIAGNOSIS — C162 Malignant neoplasm of body of stomach: Secondary | ICD-10-CM | POA: Diagnosis not present

## 2019-05-21 ENCOUNTER — Inpatient Hospital Stay: Payer: BC Managed Care – PPO | Attending: Hematology

## 2019-05-21 ENCOUNTER — Other Ambulatory Visit: Payer: Self-pay

## 2019-05-21 VITALS — BP 139/80 | HR 61 | Temp 98.0°F | Resp 16

## 2019-05-21 DIAGNOSIS — C162 Malignant neoplasm of body of stomach: Secondary | ICD-10-CM | POA: Insufficient documentation

## 2019-05-21 DIAGNOSIS — K922 Gastrointestinal hemorrhage, unspecified: Secondary | ICD-10-CM | POA: Diagnosis not present

## 2019-05-21 DIAGNOSIS — D500 Iron deficiency anemia secondary to blood loss (chronic): Secondary | ICD-10-CM | POA: Diagnosis not present

## 2019-05-21 DIAGNOSIS — E538 Deficiency of other specified B group vitamins: Secondary | ICD-10-CM | POA: Diagnosis not present

## 2019-05-21 DIAGNOSIS — Z95828 Presence of other vascular implants and grafts: Secondary | ICD-10-CM

## 2019-05-21 MED ORDER — SODIUM CHLORIDE 0.9 % IV SOLN
Freq: Once | INTRAVENOUS | Status: AC
  Filled 2019-05-21: qty 250

## 2019-05-21 MED ORDER — SODIUM CHLORIDE 0.9% FLUSH
10.0000 mL | Freq: Once | INTRAVENOUS | Status: AC
  Administered 2019-05-21: 10 mL
  Filled 2019-05-21: qty 10

## 2019-05-21 MED ORDER — SODIUM CHLORIDE 0.9 % IV SOLN
510.0000 mg | Freq: Once | INTRAVENOUS | Status: AC
  Administered 2019-05-21: 510 mg via INTRAVENOUS
  Filled 2019-05-21: qty 510

## 2019-05-21 MED ORDER — HEPARIN SOD (PORK) LOCK FLUSH 100 UNIT/ML IV SOLN
500.0000 [IU] | Freq: Once | INTRAVENOUS | Status: AC
  Administered 2019-05-21: 500 [IU]
  Filled 2019-05-21: qty 5

## 2019-05-21 NOTE — Patient Instructions (Signed)

## 2019-05-21 NOTE — Progress Notes (Signed)
Patient declined to stay for 30 minute post observation period. VSS upon leaving unit.  

## 2019-05-25 DIAGNOSIS — Z903 Acquired absence of stomach [part of]: Secondary | ICD-10-CM | POA: Diagnosis not present

## 2019-05-25 DIAGNOSIS — C160 Malignant neoplasm of cardia: Secondary | ICD-10-CM | POA: Diagnosis not present

## 2019-05-27 NOTE — Progress Notes (Signed)
[place]   [phone] [phone]   Clinic Follow up Note   Patient Care Team: Emeterio Reeve, DO as PCP - General (Osteopathic Medicine) [name], MD as Consulting Physician (General Surgery) [name], MD as Consulting Physician (Hematology) Alla Feeling, NP as Nurse Practitioner (Nurse Practitioner)  Date of Service:  06/04/2019  CHIEF COMPLAINT: f/u of Gastric Cancer  SUMMARY OF ONCOLOGIC HISTORY: Oncology History Overview Note  Cancer Staging Gastric cancer [facility]) Staging form: Stomach, AJCC 8th Edition - Clinical stage from 03/29/2018: Stage IVA (cT4b, cN1, cM0) - Signed by [name], MD on 04/03/2018 - Pathologic stage from 10/02/2018: ypT0, pN0, cM0 - Signed by Alla Feeling, NP on 01/19/2019     Gastric cancer ([place])  03/19/2018 Procedure   Upper Endoscopy by Dr. Michail Sermon 03/29/18  IMPRESSION - Normal esophagus. - Z-line regular, 38 cm from the incisors. - Non-obstructing oozing gastric ulcer with pigmented material. Biopsied. - Normal examined duodenum. - Acute gastritis.   03/29/2018 Procedure   Colonoscopy by Dr. Michail Sermon 03/29/18 IMPRESSION - Preparation of the colon was fair. - Internal hemorrhoids. - The examined portion of the ileum was normal. - No specimens collected.   03/29/2018 Initial Biopsy   Diagnosis 03/29/18 Stomach, biopsy, Proximal - ADENOCARCINOMA. - GOBLET CELL METAPLASIA. - SEE COMMENT. Microscopic Comment A Warthin Starry stain is negative for the presence of Helicobacter pylori organisms. Dr Vic Ripper has reviewed the case and concurs with this interpretation. Dr Michail Sermon was paged on 03/31/2018. (JBK:ecj 03/31/2018)   03/29/2018 Cancer Staging   Staging form: Stomach, AJCC 8th Edition - Clinical stage from 03/29/2018: Stage IVA (cT4b, cN1, cM0) - Signed by [name], MD on 04/03/2018   04/02/2018 Initial Diagnosis   Gastric cancer ([place])   04/02/2018 Imaging   CT CAP W Contrast  04/02/18 IMPRESSION: 1. Focal thickening lateral wall proximal stomach with protrusion of low-attenuation soft tissue beyond the expected confines of the gastric wall into the splenic hilum. Imaging features highly concerning for transmural tumor extension into the splenic hilum. Several small nodules in this region are likely lymph nodes, concerning for metastatic disease. 2. Scattered bilateral pulmonary nodules measuring up to 5 mm. Nonspecific, but close attention on follow-up recommended as metastatic disease not excluded. 3. Small lymph nodes in the gastrohepatic ligament, but there is no gastrohepatic or hepato duodenal ligament lymphadenopathy. No evidence for liver metastases. 4.  Aortic Atherosclerois (ICD10-170.0   04/15/2018 - 07/22/2018 Chemotherapy   FLOT4 every 2 weeks starting 04/15/18. She completed 8 cycles on 07/22/18.   08/21/2018 Imaging   CT CAP W Contrast  IMPRESSION: 1. No evidence of residual gastric mass. No abdominopelvic adenopathy. 2. Similar bilateral pulmonary nodules, indeterminate. 3. Development of upper lung and peribronchovascular predominant pulmonary opacities. Differential considerations include drug toxicity or atypical infection. Lymphangitic tumor spread felt less likely but cannot be excluded. Consider chest CT follow-up at 3-6 months. 4.  Aortic Atherosclerosis (ICD10-I70.0).    10/02/2018 Surgery   LAPAROSCOPY DIAGNOSTIC, TOTAL GASTRECTOMY, FEEDING TUBE PLACEMENT by Dr. 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 - ADENOCARCINOMA IS NOT IDENTIFIED. - THERE IS NO EVIDENCE OF CARCINOMA IN 34 OF 34 LYMPH NODES (0/34). - SEE ONCOLOGY TABLE BELOW.   10/02/2018 Cancer Staging   Staging form: Stomach, AJCC 8th Edition - Pathologic stage from 10/02/2018: ypT0, pN0, cM0 - Signed by Alla Feeling, NP on 01/19/2019  01/20/2019 Survivorship   Per Cira Rue, NP    03/10/2019 Imaging   CT CAP W contrast IMPRESSION: 1. Interval Roux-en-Y esophagojejunostomy with no the definite findings of metastatic disease in the chest, abdomen, or pelvis. 2. Multiple irregular bilateral pulmonary nodules are smaller than on the previous study. This may be infectious/inflammatory etiology but continued close attention on follow-up recommended. 3. Central mesenteric lymph nodes measure upper normal for size and are slightly increased since the preoperative exam. Attention on follow-up recommended. 4.  Aortic Atherosclerois (ICD10-170.0)      CURRENT THERAPY:  Surveillance  INTERVAL HISTORY:  [name] is here for a follow up gastric cancer. She presents to the clinic alone. She notes she is doing well. She notes she gets dumping syndrome with bloating, gas and nausea with eating too much because she is not sure when she is full s/p surgery. She notes gas with each meal and has tried beano and gas-X. She denies constipation, diarrhea or black stool. She notes her energy level is lower than baseline. She notes she saw Dr. Barry Dienes last week and will f/u in 6 months. She has not returned to work because she is not able to do duties s/p surgery.     REVIEW OF SYSTEMS:   Constitutional: Denies fevers, chills (+) weight stable lately.  Eyes: Denies blurriness of vision Ears, nose, mouth, throat, and face: Denies mucositis or sore throat Respiratory: Denies cough, dyspnea or wheezes Cardiovascular: Denies palpitation, chest discomfort or lower extremity swelling Gastrointestinal:  Denies nausea, heartburn or change in bowel habits (+) Occasional dumping syndrome post-prandial (+) abdominal gas Skin: Denies abnormal skin rashes Lymphatics: Denies new lymphadenopathy or easy bruising Neurological:Denies numbness, tingling or new weaknesses Behavioral/Psych: Mood is stable, no new changes  All other systems  were reviewed with the patient and are negative.  MEDICAL HISTORY:  Past Medical History:  Diagnosis Date  . Anemia   . Asthma   . Breast cancer ([place])   . Dyspnea    over exertion  . Hypertension   . Hypothyroidism   . Stomach cancer [facility])     SURGICAL HISTORY: Past Surgical History:  Procedure Laterality Date  . ABDOMINAL HYSTERECTOMY  1996  . BARIATRIC SURGERY    . BILATERAL SALPINGOOPHORECTOMY  2007  . BIOPSY  03/29/2018   Procedure: BIOPSY;  Surgeon: Wilford Corner, MD;  Location: [place];  Service: Endoscopy;;  . CARPAL TUNNEL RELEASE    . COLONOSCOPY N/A 03/29/2018   Procedure: COLONOSCOPY;  Surgeon: Wilford Corner, MD;  Location[facility] ENDOSCOPY;  Service: Endoscopy;  Laterality: N/A;  . ESOPHAGOGASTRODUODENOSCOPY N/A 03/29/2018   Procedure: ESOPHAGOGASTRODUODENOSCOPY (EGD);  Surgeon: Wilford Corner, MD;  Location: [place];  Service: Endoscopy;  Laterality: N/A;  . GASTRECTOMY N/A 10/02/2018   Procedure: TOTAL GASTRECTOMY;  Surgeon: [name], MD;  Location: [place];  Service: General;  Laterality: N/A;  . GASTROSTOMY N/A 10/02/2018   Procedure: FEEDING TUBE PLACEMENT;  Surgeon: [name], MD;  Location: [place];  Service: General;  Laterality: N/A;  . LAPAROSCOPY N/A 04/13/2018   Procedure: LAPAROSCOPY DIAGNOSTIC;  Surgeon: [name], MD;  Location: [facility];  Service: General;  Laterality: N/A;  . LAPAROSCOPY N/A 10/02/2018   Procedure: LAPAROSCOPY DIAGNOSTIC;  Surgeon: [name], MD;  Location: [place] [place];  Service: General;  Laterality: N/A;  . MASTECTOMY  2007  . PORTACATH PLACEMENT Left 04/13/2018   Procedure: INSERTION PORT-A-CATH ERAS PATHWAY;  Surgeon: [name], MD;  Location: [facility];  Service: General;  Laterality: Left;  subclavian    I have reviewed the social history and family history with the patient and they are unchanged from previous note.  ALLERGIES:  has No Known Allergies.  MEDICATIONS:  Current Outpatient Medications   Medication Sig Dispense Refill  . acyclovir (ZOVIRAX) 400 MG tablet Take 1 tablet (400 mg total) by mouth 3 (three) times daily. For five days. As needed for cold sore. (Patient taking differently: Take 400 mg by mouth 3 (three) times daily as needed (cold sores.). ) 15 tablet 0  . albuterol (VENTOLIN HFA) 108 (90 Base) MCG/ACT inhaler Inhale 1-2 puffs into the lungs every 4 (four) hours as needed for wheezing or shortness of breath. 18 g 11  . baclofen (LIORESAL) 10 MG tablet Take 1 tablet (10 mg total) by mouth 4 (four) times daily as needed for muscle spasms. 30 each 0  . calcium carbonate (TUMS - DOSED IN MG ELEMENTAL CALCIUM) 500 MG chewable tablet Chew 1 tablet (200 mg of elemental calcium total) by mouth 3 (three) times daily. 90 tablet 11  . fluticasone-salmeterol (ADVAIR HFA) 115-21 MCG/ACT inhaler Inhale 2 puffs into the lungs 2 (two) times daily. 3 Inhaler 99  . levothyroxine (SYNTHROID) 150 MCG tablet Take 1 tablet (150 mcg total) by mouth daily before breakfast. Need labs prior to further refills! 90 tablet 0  . lidocaine-prilocaine (EMLA) cream Apply 1 application topically daily as needed (prior to port being accessed). 30 g 1  . loperamide (IMODIUM) 2 MG capsule Take 1 capsule (2 mg total) by mouth 2 (two) times daily as needed for diarrhea or loose stools. 30 capsule 0  . vitamin B-12 1000 MCG tablet Take 1 tablet (1,000 mcg total) by mouth daily. 30 tablet 0   No current facility-administered medications for this visit.    PHYSICAL EXAMINATION: ECOG PERFORMANCE STATUS: 1 - Symptomatic but completely ambulatory  Vitals:   06/04/19 0904  BP: 112/81  Pulse: 90  Resp: 18  Temp: 98.2 F (36.8 C)  SpO2: 99%   Filed Weights   06/04/19 0904  Weight: 128 lb 12.8 oz (58.4 kg)    GENERAL:alert, no distress and comfortable SKIN: skin color, texture, turgor are normal, no rashes or significant lesions EYES: normal, Conjunctiva are pink and non-injected, sclera clear  NECK:  supple, thyroid normal size, non-tender, without nodularity LYMPH:  no palpable lymphadenopathy in the cervical, axillary  LUNGS: clear to auscultation and percussion with normal breathing effort HEART: regular rate & rhythm and no murmurs and no lower extremity edema ABDOMEN:abdomen soft, non-tender and normal bowel sounds (+) RUQ discomfort/very mild tenderness Musculoskeletal:no cyanosis of digits and no clubbing  NEURO: alert & oriented x 3 with fluent speech, no focal motor/sensory deficits  LABORATORY DATA:  I have reviewed the data as listed CBC Latest Ref Rng & Units 06/04/2019 05/07/2019 04/06/2019  WBC 4.0 - 10.5 K/uL 5.2 4.9 5.1  Hemoglobin 12.0 - 15.0 g/dL 11.3(L) 11.3(L) 11.4(L)  Hematocrit 36.0 - 46.0 % 32.7(L) 32.8(L) 33.0(L)  Platelets 150 - 400 K/uL 175 169 162     CMP Latest Ref Rng & Units 06/04/2019 05/07/2019 04/06/2019  Glucose 70 - 99 mg/dL 92 147(H) 75  BUN 6 - 20 mg/dL 9 11 8   Creatinine 0.44 - 1.00 mg/dL 0.59 0.62 0.59  Sodium 135 - 145 mmol/L 138 139 141  Potassium 3.5 - 5.1 mmol/L 3.6 3.7 3.7  Chloride 98 - 111 mmol/L 106 104 106  CO2 22 - 32 mmol/L 27 26 27  Calcium 8.9 - 10.3 mg/dL 9.4 9.3 8.7(L)  Total Protein 6.5 - 8.1 g/dL 7.2 7.7 7.2  Total Bilirubin 0.3 - 1.2 mg/dL 0.2(L) 0.2(L) 0.3  Alkaline Phos 38 - 126 U/L 91 87 84  AST 15 - 41 U/L 25 32 31  ALT 0 - 44 U/L 27 46(H) 41      RADIOGRAPHIC STUDIES: I have personally reviewed the radiological images as listed and agreed with the findings in the report. No results found.   ASSESSMENT & PLAN:  Mariah Kemp is a 59 y.o. female with    1. 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 Given her locally advanced disease, she completed 8 cycles ofneo-adjuvant chemoFLOT4. -She underwent total Gastrectomyand lymph node dissectionand had feeding tube placed on 10/02/18. She had complete pathological response with  allnodesnegative.  -Given herpCR, I do not recommend moreadjuvant radiation or chemotherapy. Will proceed with surveillance.  -I recommend she keep her PAC in place for at least a year given her risk of recurrence. Will continue flushes every 4-8 weeks.  -She is clinically stable. She continues to regulate her eating and maintaining her weight. Physical exam unremarkable with very mild RUQ tenderness. Labs reviewed, CBC and CMP WNL except hg 11.3.  -F/u in 3 months with CT CAP   2.Weight loss, Mild to moderate Malnutrition, Acid reflux  -With surgery she has feeding tube in place on 10/02/18. She previously switched from Osmolite due to acid reflux and nausea.  -She continues to work on oral eating and managing tolerance to eating. I encouraged high protein small frequent meals. Weight is currently stable, but has room to gain weight.    3. Anemia, B12 deficiency S/p gastric surgery -Secondary to GI blood loss  -Given 3u blood transfusion on 04/01/18 and s/p 2 doses of IV iron in 03/2018, she responded moderately wellwith improved anemia. -started monthly B12 injections in 01/2019. If not enough may increase injection to weekly.  -She has been taking Geritol for iron. I recommend Prenatal vitamins once daily.  -Mild anemia stable. Iron panel and B12 still pending today (06/04/19)  4. H/o stage 3 right breast cancer in 2007 -Treated with right mastectomy, reconstruction with 3 LN removed. She underwent chemotherapy for 5 months, likely AC-T. She completed Tamoxifen for at least 5 years.   5. Genetic Testing was not pursued for now due to cost  6. HTN andhypothyroidism -We will hold hydrochlorothiazide for now during the chemo -HTNverywell controlled -Continue to follow-up with PCP   PLAN: -I refilled her EMLA cream today  -Flush in 4 weeks  -B12 injection every 4 weeks X3 -F/u in 3 months with lab, flush and CT CAP w contrast a few days before.    No problem-specific  Assessment & Plan notes found for this encounter.   Orders Placed This Encounter  Procedures  . CT Abdomen Pelvis W Contrast    Standing Status:   Future    Standing Expiration Date:   06/03/2020    Order Specific Question:   If indicated for the ordered procedure, I authorize the administration of contrast media per Radiology protocol    Answer:   Yes    Order Specific Question:   Is patient pregnant?    Answer:   No    Order Specific Question:   Preferred imaging location?    Answer:   [facility]    Order Specific Question:   Is Oral Contrast requested  for this exam?    Answer:   Yes, Per Radiology protocol    Order Specific Question:   Radiology Contrast Protocol - do NOT remove file path    Answer:   \\[email or web address]  . CT Chest W Contrast    Standing Status:   Future    Standing Expiration Date:   06/03/2020    Order Specific Question:   If indicated for the ordered procedure, I authorize the administration of contrast media per Radiology protocol    Answer:   Yes    Order Specific Question:   Is patient pregnant?    Answer:   No    Order Specific Question:   Preferred imaging location?    Answer:   [facility]    Order Specific Question:   Radiology Contrast Protocol - do NOT remove file path    Answer:   \\[email or web address]   All questions were answered. The patient knows to call the clinic with any problems, questions or concerns. No barriers to learning was detected. The total time spent in the appointment was 30 minutes.     [name], MD 06/04/2019   I, Joslyn Devon, am acting as scribe for [name], MD.   I have reviewed the above documentation for accuracy and completeness, and I agree with the above.

## 2019-06-02 ENCOUNTER — Telehealth: Payer: Self-pay

## 2019-06-02 NOTE — Telephone Encounter (Signed)
Nutrition  Called patient for nutrition follow-up.  No answer.  Left message with call back number.   Jarrette Dehner B. Aiesha Leland, RD, LDN Registered Dietitian [phone] (pager)   

## 2019-06-04 ENCOUNTER — Inpatient Hospital Stay (HOSPITAL_BASED_OUTPATIENT_CLINIC_OR_DEPARTMENT_OTHER): Payer: BC Managed Care – PPO | Admitting: Hematology

## 2019-06-04 ENCOUNTER — Inpatient Hospital Stay: Payer: BC Managed Care – PPO

## 2019-06-04 ENCOUNTER — Encounter: Payer: Self-pay | Admitting: Hematology

## 2019-06-04 ENCOUNTER — Other Ambulatory Visit: Payer: Self-pay

## 2019-06-04 VITALS — BP 112/81 | HR 90 | Temp 98.2°F | Resp 18 | Ht 61.0 in | Wt 128.8 lb

## 2019-06-04 VITALS — BP 128/72 | HR 62 | Temp 98.2°F | Resp 18

## 2019-06-04 DIAGNOSIS — D500 Iron deficiency anemia secondary to blood loss (chronic): Secondary | ICD-10-CM | POA: Diagnosis not present

## 2019-06-04 DIAGNOSIS — C162 Malignant neoplasm of body of stomach: Secondary | ICD-10-CM

## 2019-06-04 DIAGNOSIS — I10 Essential (primary) hypertension: Secondary | ICD-10-CM | POA: Diagnosis not present

## 2019-06-04 DIAGNOSIS — Z95828 Presence of other vascular implants and grafts: Secondary | ICD-10-CM

## 2019-06-04 DIAGNOSIS — E538 Deficiency of other specified B group vitamins: Secondary | ICD-10-CM | POA: Diagnosis not present

## 2019-06-04 DIAGNOSIS — K922 Gastrointestinal hemorrhage, unspecified: Secondary | ICD-10-CM | POA: Diagnosis not present

## 2019-06-04 DIAGNOSIS — C169 Malignant neoplasm of stomach, unspecified: Secondary | ICD-10-CM

## 2019-06-04 LAB — CBC WITH DIFFERENTIAL (CANCER CENTER ONLY)
Abs Immature Granulocytes: 0 10*3/uL (ref 0.00–0.07)
Basophils Absolute: 0 10*3/uL (ref 0.0–0.1)
Basophils Relative: 1 %
Eosinophils Absolute: 0.4 10*3/uL (ref 0.0–0.5)
Eosinophils Relative: 7 %
HCT: 32.7 % — ABNORMAL LOW (ref 36.0–46.0)
Hemoglobin: 11.3 g/dL — ABNORMAL LOW (ref 12.0–15.0)
Immature Granulocytes: 0 %
Lymphocytes Relative: 34 %
Lymphs Abs: 1.8 10*3/uL (ref 0.7–4.0)
MCH: 33.4 pg (ref 26.0–34.0)
MCHC: 34.6 g/dL (ref 30.0–36.0)
MCV: 96.7 fL (ref 80.0–100.0)
Monocytes Absolute: 0.5 10*3/uL (ref 0.1–1.0)
Monocytes Relative: 10 %
Neutro Abs: 2.5 10*3/uL (ref 1.7–7.7)
Neutrophils Relative %: 48 %
Platelet Count: 175 10*3/uL (ref 150–400)
RBC: 3.38 MIL/uL — ABNORMAL LOW (ref 3.87–5.11)
RDW: 13.2 % (ref 11.5–15.5)
WBC Count: 5.2 10*3/uL (ref 4.0–10.5)
nRBC: 0 % (ref 0.0–0.2)

## 2019-06-04 LAB — CMP (CANCER CENTER ONLY)
ALT: 27 U/L (ref 0–44)
AST: 25 U/L (ref 15–41)
Albumin: 3.6 g/dL (ref 3.5–5.0)
Alkaline Phosphatase: 91 U/L (ref 38–126)
Anion gap: 5 (ref 5–15)
BUN: 9 mg/dL (ref 6–20)
CO2: 27 mmol/L (ref 22–32)
Calcium: 9.4 mg/dL (ref 8.9–10.3)
Chloride: 106 mmol/L (ref 98–111)
Creatinine: 0.59 mg/dL (ref 0.44–1.00)
GFR, Est AFR Am: >60 mL/min (ref >60)
GFR, Estimated: >60 mL/min (ref >60)
Glucose, Bld: 92 mg/dL (ref 70–99)
Potassium: 3.6 mmol/L (ref 3.5–5.1)
Sodium: 138 mmol/L (ref 135–145)
Total Bilirubin: 0.2 mg/dL — ABNORMAL LOW (ref 0.3–1.2)
Total Protein: 7.2 g/dL (ref 6.5–8.1)

## 2019-06-04 LAB — VITAMIN B12: Vitamin B-12: 225 pg/mL (ref 180–914)

## 2019-06-04 LAB — FERRITIN: Ferritin: 1355 ng/mL — ABNORMAL HIGH (ref 11–307)

## 2019-06-04 MED ORDER — HEPARIN SOD (PORK) LOCK FLUSH 100 UNIT/ML IV SOLN
500.0000 [IU] | Freq: Once | INTRAVENOUS | Status: AC
  Administered 2019-06-04: 500 [IU]
  Filled 2019-06-04: qty 5

## 2019-06-04 MED ORDER — SODIUM CHLORIDE 0.9% FLUSH
10.0000 mL | Freq: Once | INTRAVENOUS | Status: AC
  Administered 2019-06-04: 10 mL
  Filled 2019-06-04: qty 10

## 2019-06-04 MED ORDER — CYANOCOBALAMIN 1000 MCG/ML IJ SOLN
1000.0000 ug | Freq: Once | INTRAMUSCULAR | Status: AC
  Administered 2019-06-04: 1000 ug via INTRAMUSCULAR

## 2019-06-04 MED ORDER — LIDOCAINE-PRILOCAINE 2.5-2.5 % EX CREA: 1.0000 "application " | TOPICAL_CREAM | Freq: Every day | CUTANEOUS | 1 refills | Status: DC | PRN

## 2019-06-05 LAB — CANCER ANTIGEN 19-9: CA 19-9: 9 U/mL (ref 0–35)

## 2019-06-07 ENCOUNTER — Ambulatory Visit: Payer: BC Managed Care – PPO | Admitting: Osteopathic Medicine

## 2019-06-07 ENCOUNTER — Telehealth: Payer: Self-pay | Admitting: Emergency Medicine

## 2019-06-07 ENCOUNTER — Other Ambulatory Visit: Payer: Self-pay | Admitting: Osteopathic Medicine

## 2019-06-07 ENCOUNTER — Telehealth: Payer: Self-pay | Admitting: Hematology

## 2019-06-07 DIAGNOSIS — E039 Hypothyroidism, unspecified: Secondary | ICD-10-CM

## 2019-06-07 LAB — TSH: TSH: 18.98 mIU/L — ABNORMAL HIGH (ref 0.40–4.50)

## 2019-06-07 NOTE — Progress Notes (Signed)
Patient called to cancel her appointment and reports that she was only needing her TSH checked before she had a follow up. She is going to get labs done and then if she needs a visit she will come for the visit. FYI.   Labs ordered.

## 2019-06-07 NOTE — Telephone Encounter (Signed)
-----   Message from [name], MD sent at 06/07/2019  7:52 AM EDT ----- Please let pt know her lab results, iron (ferritin) level is high due to her recent iv iron, will come down overtime. B12 on low end of normal range, continue injections. Tumor marker WNL, no concerns, thanks   [name]  06/07/2019

## 2019-06-07 NOTE — Telephone Encounter (Signed)
Scheduled appt per 3/19 los.  Sent a message to HIM pool to get a calendar mailed out.

## 2019-06-07 NOTE — Telephone Encounter (Signed)
Results and recommendations per MD Burr Medico given to pt who verbalized understanding and denies any further questions/concerns at this time.

## 2019-06-08 ENCOUNTER — Encounter: Payer: Self-pay | Admitting: Hematology

## 2019-06-09 ENCOUNTER — Other Ambulatory Visit: Payer: Self-pay | Admitting: Osteopathic Medicine

## 2019-06-09 ENCOUNTER — Encounter: Payer: Self-pay | Admitting: Osteopathic Medicine

## 2019-06-09 MED ORDER — LEVOTHYROXINE SODIUM 200 MCG PO TABS: 200.0000 ug | ORAL_TABLET | Freq: Every day | ORAL | 0 refills | Status: DC

## 2019-06-09 MED ORDER — LEVOTHYROXINE SODIUM 150 MCG PO TABS: 150.0000 ug | ORAL_TABLET | Freq: Every day | ORAL | 0 refills | Status: DC

## 2019-06-10 ENCOUNTER — Ambulatory Visit: Payer: BC Managed Care – PPO | Attending: Internal Medicine

## 2019-06-10 DIAGNOSIS — Z23 Encounter for immunization: Secondary | ICD-10-CM

## 2019-06-10 NOTE — Progress Notes (Signed)
   Covid-19 Vaccination Clinic  Name:  Mariah Kemp    MRN: [id number] DOB: 1961-02-09  06/10/2019  [name] was observed post Covid-19 immunization for 15 minutes without incident. She was provided with Vaccine Information Sheet and instruction to access the V-Safe system.   [name] was instructed to call 911 with any severe reactions post vaccine: Marland Kitchen Difficulty breathing  . Swelling of face and throat  . A fast heartbeat  . A bad rash all over body  . Dizziness and weakness   Immunizations Administered    Name Date Dose VIS Date Route   Pfizer COVID-19 Vaccine 06/10/2019 12:35 PM 0.3 mL 02/26/2019 Intramuscular   Manufacturer: [place]   Lot: [id number]   [place]: [id number]

## 2019-06-17 DIAGNOSIS — C162 Malignant neoplasm of body of stomach: Secondary | ICD-10-CM | POA: Diagnosis not present

## 2019-07-05 ENCOUNTER — Other Ambulatory Visit: Payer: Self-pay

## 2019-07-05 ENCOUNTER — Inpatient Hospital Stay: Payer: BC Managed Care – PPO | Attending: Hematology

## 2019-07-05 ENCOUNTER — Inpatient Hospital Stay: Payer: BC Managed Care – PPO

## 2019-07-05 ENCOUNTER — Ambulatory Visit: Payer: BC Managed Care – PPO | Attending: Internal Medicine

## 2019-07-05 VITALS — BP 118/78 | HR 78 | Temp 98.2°F | Resp 18

## 2019-07-05 DIAGNOSIS — Z95828 Presence of other vascular implants and grafts: Secondary | ICD-10-CM

## 2019-07-05 DIAGNOSIS — E538 Deficiency of other specified B group vitamins: Secondary | ICD-10-CM | POA: Diagnosis not present

## 2019-07-05 DIAGNOSIS — Z23 Encounter for immunization: Secondary | ICD-10-CM

## 2019-07-05 DIAGNOSIS — D500 Iron deficiency anemia secondary to blood loss (chronic): Secondary | ICD-10-CM

## 2019-07-05 MED ORDER — HEPARIN SOD (PORK) LOCK FLUSH 100 UNIT/ML IV SOLN
500.0000 [IU] | Freq: Once | INTRAVENOUS | Status: AC
  Administered 2019-07-05: 500 [IU]
  Filled 2019-07-05: qty 5

## 2019-07-05 MED ORDER — SODIUM CHLORIDE 0.9% FLUSH
10.0000 mL | Freq: Once | INTRAVENOUS | Status: AC
  Administered 2019-07-05: 10 mL
  Filled 2019-07-05: qty 10

## 2019-07-05 MED ORDER — CYANOCOBALAMIN 1000 MCG/ML IJ SOLN
1000.0000 ug | Freq: Once | INTRAMUSCULAR | Status: AC
  Administered 2019-07-05: 1000 ug via INTRAMUSCULAR

## 2019-07-05 NOTE — Progress Notes (Signed)
   Covid-19 Vaccination Clinic  Name:  Mariah Kemp    MRN: [id number] DOB: 04-09-1960  07/05/2019  [name] was observed post Covid-19 immunization for 15 minutes without incident. She was provided with Vaccine Information Sheet and instruction to access the V-Safe system.   [name] was instructed to call 911 with any severe reactions post vaccine: Marland Kitchen Difficulty breathing  . Swelling of face and throat  . A fast heartbeat  . A bad rash all over body  . Dizziness and weakness   Immunizations Administered    Name Date Dose VIS Date Route   Pfizer COVID-19 Vaccine 07/05/2019 11:06 AM 0.3 mL 05/12/2018 Intramuscular   Manufacturer: [place]   Lot: [id number]   [place]: [id number]

## 2019-07-17 DIAGNOSIS — C162 Malignant neoplasm of body of stomach: Secondary | ICD-10-CM | POA: Diagnosis not present

## 2019-08-04 ENCOUNTER — Ambulatory Visit: Payer: BC Managed Care – PPO

## 2019-08-06 ENCOUNTER — Inpatient Hospital Stay: Payer: BC Managed Care – PPO | Attending: Hematology

## 2019-08-06 ENCOUNTER — Other Ambulatory Visit: Payer: Self-pay

## 2019-08-06 DIAGNOSIS — E538 Deficiency of other specified B group vitamins: Secondary | ICD-10-CM | POA: Diagnosis not present

## 2019-08-06 DIAGNOSIS — Z95828 Presence of other vascular implants and grafts: Secondary | ICD-10-CM

## 2019-08-06 DIAGNOSIS — D500 Iron deficiency anemia secondary to blood loss (chronic): Secondary | ICD-10-CM

## 2019-08-06 MED ORDER — CYANOCOBALAMIN 1000 MCG/ML IJ SOLN
1000.0000 ug | Freq: Once | INTRAMUSCULAR | Status: AC
  Administered 2019-08-06: 1000 ug via INTRAMUSCULAR

## 2019-08-17 DIAGNOSIS — C162 Malignant neoplasm of body of stomach: Secondary | ICD-10-CM | POA: Diagnosis not present

## 2019-08-31 ENCOUNTER — Inpatient Hospital Stay: Payer: BC Managed Care – PPO

## 2019-08-31 ENCOUNTER — Inpatient Hospital Stay: Payer: BC Managed Care – PPO | Attending: Hematology

## 2019-08-31 ENCOUNTER — Other Ambulatory Visit: Payer: Self-pay

## 2019-08-31 ENCOUNTER — Ambulatory Visit (HOSPITAL_COMMUNITY)
Admission: RE | Admit: 2019-08-31 | Discharge: 2019-08-31 | Disposition: A | Discharge status: Home / Self Care | Payer: BC Managed Care – PPO | Source: Ambulatory Visit | Attending: Hematology | Admitting: Hematology

## 2019-08-31 DIAGNOSIS — C162 Malignant neoplasm of body of stomach: Secondary | ICD-10-CM | POA: Insufficient documentation

## 2019-08-31 DIAGNOSIS — R918 Other nonspecific abnormal finding of lung field: Secondary | ICD-10-CM | POA: Diagnosis not present

## 2019-08-31 DIAGNOSIS — E538 Deficiency of other specified B group vitamins: Secondary | ICD-10-CM | POA: Insufficient documentation

## 2019-08-31 DIAGNOSIS — N1330 Unspecified hydronephrosis: Secondary | ICD-10-CM | POA: Diagnosis not present

## 2019-08-31 DIAGNOSIS — C169 Malignant neoplasm of stomach, unspecified: Secondary | ICD-10-CM | POA: Insufficient documentation

## 2019-08-31 DIAGNOSIS — N3289 Other specified disorders of bladder: Secondary | ICD-10-CM | POA: Diagnosis not present

## 2019-08-31 DIAGNOSIS — Z95828 Presence of other vascular implants and grafts: Secondary | ICD-10-CM

## 2019-08-31 DIAGNOSIS — D500 Iron deficiency anemia secondary to blood loss (chronic): Secondary | ICD-10-CM

## 2019-08-31 LAB — CBC WITH DIFFERENTIAL (CANCER CENTER ONLY)
Abs Immature Granulocytes: 0 10*3/uL (ref 0.00–0.07)
Basophils Absolute: 0 10*3/uL (ref 0.0–0.1)
Basophils Relative: 1 %
Eosinophils Absolute: 0.5 10*3/uL (ref 0.0–0.5)
Eosinophils Relative: 8 %
HCT: 32.7 % — ABNORMAL LOW (ref 36.0–46.0)
Hemoglobin: 11.7 g/dL — ABNORMAL LOW (ref 12.0–15.0)
Immature Granulocytes: 0 %
Lymphocytes Relative: 41 %
Lymphs Abs: 2.4 10*3/uL (ref 0.7–4.0)
MCH: 33.5 pg (ref 26.0–34.0)
MCHC: 35.8 g/dL (ref 30.0–36.0)
MCV: 93.7 fL (ref 80.0–100.0)
Monocytes Absolute: 0.5 10*3/uL (ref 0.1–1.0)
Monocytes Relative: 8 %
Neutro Abs: 2.5 10*3/uL (ref 1.7–7.7)
Neutrophils Relative %: 42 %
Platelet Count: 158 10*3/uL (ref 150–400)
RBC: 3.49 MIL/uL — ABNORMAL LOW (ref 3.87–5.11)
RDW: 11.7 % (ref 11.5–15.5)
WBC Count: 5.9 10*3/uL (ref 4.0–10.5)
nRBC: 0 % (ref 0.0–0.2)

## 2019-08-31 LAB — CMP (CANCER CENTER ONLY)
ALT: 24 U/L (ref 0–44)
AST: 22 U/L (ref 15–41)
Albumin: 3.6 g/dL (ref 3.5–5.0)
Alkaline Phosphatase: 78 U/L (ref 38–126)
Anion gap: 10 (ref 5–15)
BUN: 9 mg/dL (ref 6–20)
CO2: 25 mmol/L (ref 22–32)
Calcium: 9.6 mg/dL (ref 8.9–10.3)
Chloride: 104 mmol/L (ref 98–111)
Creatinine: 0.57 mg/dL (ref 0.44–1.00)
GFR, Est AFR Am: >60 mL/min (ref >60)
GFR, Estimated: >60 mL/min (ref >60)
Glucose, Bld: 85 mg/dL (ref 70–99)
Potassium: 3.9 mmol/L (ref 3.5–5.1)
Sodium: 139 mmol/L (ref 135–145)
Total Bilirubin: 0.4 mg/dL (ref 0.3–1.2)
Total Protein: 7.3 g/dL (ref 6.5–8.1)

## 2019-08-31 LAB — FERRITIN: Ferritin: 925 ng/mL — ABNORMAL HIGH (ref 11–307)

## 2019-08-31 MED ORDER — IOHEXOL 300 MG/ML  SOLN
100.0000 mL | Freq: Once | INTRAMUSCULAR | Status: AC | PRN
  Administered 2019-08-31: 100 mL via INTRAVENOUS

## 2019-08-31 MED ORDER — SODIUM CHLORIDE 0.9% FLUSH
10.0000 mL | Freq: Once | INTRAVENOUS | Status: AC
  Administered 2019-08-31: 10 mL
  Filled 2019-08-31: qty 10

## 2019-08-31 MED ORDER — HEPARIN SOD (PORK) LOCK FLUSH 100 UNIT/ML IV SOLN
INTRAVENOUS | Status: AC
  Filled 2019-08-31: qty 5

## 2019-08-31 MED ORDER — HEPARIN SOD (PORK) LOCK FLUSH 100 UNIT/ML IV SOLN
500.0000 [IU] | Freq: Once | INTRAVENOUS | Status: AC
  Administered 2019-08-31: 500 [IU]

## 2019-08-31 MED ORDER — SODIUM CHLORIDE (PF) 0.9 % IJ SOLN
INTRAMUSCULAR | Status: AC
  Filled 2019-08-31: qty 50

## 2019-08-31 NOTE — Progress Notes (Signed)
[place]   [phone] [phone]   Clinic Follow up Note   Patient Care Team: Emeterio Reeve, DO as PCP - General (Osteopathic Medicine) [name], MD as Consulting Physician (General Surgery) [name], MD as Consulting Physician (Hematology) Alla Feeling, NP as Nurse Practitioner (Nurse Practitioner)  Date of Service:  09/03/2019  CHIEF COMPLAINT: f/u of Gastric Cancer  SUMMARY OF ONCOLOGIC HISTORY: Oncology History Overview Note  Cancer Staging Gastric cancer [facility]) Staging form: Stomach, AJCC 8th Edition - Clinical stage from 03/29/2018: Stage IVA (cT4b, cN1, cM0) - Signed by [name], MD on 04/03/2018 - Pathologic stage from 10/02/2018: ypT0, pN0, cM0 - Signed by Alla Feeling, NP on 01/19/2019     Gastric cancer ([place])  03/19/2018 Procedure   Upper Endoscopy by Dr. Michail Sermon 03/29/18  IMPRESSION - Normal esophagus. - Z-line regular, 38 cm from the incisors. - Non-obstructing oozing gastric ulcer with pigmented material. Biopsied. - Normal examined duodenum. - Acute gastritis.   03/29/2018 Procedure   Colonoscopy by Dr. Michail Sermon 03/29/18 IMPRESSION - Preparation of the colon was fair. - Internal hemorrhoids. - The examined portion of the ileum was normal. - No specimens collected.   03/29/2018 Initial Biopsy   Diagnosis 03/29/18 Stomach, biopsy, Proximal - ADENOCARCINOMA. - GOBLET CELL METAPLASIA. - SEE COMMENT. Microscopic Comment A Warthin Starry stain is negative for the presence of Helicobacter pylori organisms. Dr Vic Ripper has reviewed the case and concurs with this interpretation. Dr Michail Sermon was paged on 03/31/2018. (JBK:ecj 03/31/2018)   03/29/2018 Cancer Staging   Staging form: Stomach, AJCC 8th Edition - Clinical stage from 03/29/2018: Stage IVA (cT4b, cN1, cM0) - Signed by [name], MD on 04/03/2018   04/02/2018 Initial Diagnosis   Gastric cancer ([place])   04/02/2018 Imaging   CT CAP W Contrast  04/02/18 IMPRESSION: 1. Focal thickening lateral wall proximal stomach with protrusion of low-attenuation soft tissue beyond the expected confines of the gastric wall into the splenic hilum. Imaging features highly concerning for transmural tumor extension into the splenic hilum. Several small nodules in this region are likely lymph nodes, concerning for metastatic disease. 2. Scattered bilateral pulmonary nodules measuring up to 5 mm. Nonspecific, but close attention on follow-up recommended as metastatic disease not excluded. 3. Small lymph nodes in the gastrohepatic ligament, but there is no gastrohepatic or hepato duodenal ligament lymphadenopathy. No evidence for liver metastases. 4.  Aortic Atherosclerois (ICD10-170.0   04/15/2018 - 07/22/2018 Chemotherapy   FLOT4 every 2 weeks starting 04/15/18. She completed 8 cycles on 07/22/18.   08/21/2018 Imaging   CT CAP W Contrast  IMPRESSION: 1. No evidence of residual gastric mass. No abdominopelvic adenopathy. 2. Similar bilateral pulmonary nodules, indeterminate. 3. Development of upper lung and peribronchovascular predominant pulmonary opacities. Differential considerations include drug toxicity or atypical infection. Lymphangitic tumor spread felt less likely but cannot be excluded. Consider chest CT follow-up at 3-6 months. 4.  Aortic Atherosclerosis (ICD10-I70.0).    10/02/2018 Surgery   LAPAROSCOPY DIAGNOSTIC, TOTAL GASTRECTOMY, FEEDING TUBE PLACEMENT by Dr. 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 - ADENOCARCINOMA IS NOT IDENTIFIED. - THERE IS NO EVIDENCE OF CARCINOMA IN 34 OF 34 LYMPH NODES (0/34). - SEE ONCOLOGY TABLE BELOW.   10/02/2018 Cancer Staging   Staging form: Stomach, AJCC 8th Edition - Pathologic stage from 10/02/2018: ypT0, pN0, cM0 - Signed by Alla Feeling, NP on 01/19/2019  01/20/2019 Survivorship   Per Cira Rue, NP    03/10/2019 Imaging   CT CAP W contrast IMPRESSION: 1. Interval Roux-en-Y esophagojejunostomy with no the definite findings of metastatic disease in the chest, abdomen, or pelvis. 2. Multiple irregular bilateral pulmonary nodules are smaller than on the previous study. This may be infectious/inflammatory etiology but continued close attention on follow-up recommended. 3. Central mesenteric lymph nodes measure upper normal for size and are slightly increased since the preoperative exam. Attention on follow-up recommended. 4.  Aortic Atherosclerois (ICD10-170.0)   08/31/2019 Imaging   CT CAP w contrast  IMPRESSION: 1. Stable exam. No new or progressive findings in the chest, abdomen, or pelvis. 2. Numerous bilateral irregular nodular opacities in both lungs are stable in the interval. No new suspicious pulmonary nodule or mass. 3. Stable borderline enlarged portocaval lymph node. 4. Aortic Atherosclerosis (ICD10-I70.0).      CURRENT THERAPY:  Surveillance  INTERVAL HISTORY:  Mariah Kemp is here for a follow up gastric cancer. She presents to the clinic alone. She notes she is doing well. She notes she feels weaker than her usual fatigue and she lost 6 pounds. She notes tries to eat constantly but in small doses. She feels like her foods gets stuck in her throat but work up with Dr Barry Dienes shows adequate swallowing but has mild regurgitation at her anastomosis. This sensation happens with soft and solid foods. She notes she uses inhaler due to SOB prior to cancer diagnosis. She used to smokes from [age] years old with less than 1ppd.     REVIEW OF SYSTEMS:   Constitutional: Denies fevers, chills or abnormal weight loss Eyes: Denies blurriness of vision Ears, nose, mouth, throat, and face: Denies mucositis or sore throat Respiratory: Denies cough, dyspnea or wheezes Cardiovascular: Denies palpitation, chest discomfort or  lower extremity swelling Gastrointestinal:  Denies nausea, heartburn or change in bowel habits Skin: Denies abnormal skin rashes Lymphatics: Denies new lymphadenopathy or easy bruising Neurological:Denies numbness, tingling or new weaknesses Behavioral/Psych: Mood is stable, no new changes  All other systems were reviewed with the patient and are negative.  MEDICAL HISTORY:  Past Medical History:  Diagnosis Date  . Anemia   . Asthma   . Breast cancer ([place])   . Dyspnea    over exertion  . Hypertension   . Hypothyroidism   . Stomach cancer [facility])     SURGICAL HISTORY: Past Surgical History:  Procedure Laterality Date  . ABDOMINAL HYSTERECTOMY  1996  . BARIATRIC SURGERY    . BILATERAL SALPINGOOPHORECTOMY  2007  . BIOPSY  03/29/2018   Procedure: BIOPSY;  Surgeon: Wilford Corner, MD;  Location: [place];  Service: Endoscopy;;  . CARPAL TUNNEL RELEASE    . COLONOSCOPY N/A 03/29/2018   Procedure: COLONOSCOPY;  Surgeon: Wilford Corner, MD;  Location: [facility];  Service: Endoscopy;  Laterality: N/A;  . ESOPHAGOGASTRODUODENOSCOPY N/A 03/29/2018   Procedure: ESOPHAGOGASTRODUODENOSCOPY (EGD);  Surgeon: Wilford Corner, MD;  Location: [place];  Service: Endoscopy;  Laterality: N/A;  . GASTRECTOMY N/A 10/02/2018   Procedure: TOTAL GASTRECTOMY;  Surgeon: [name], MD;  Location: [place];  Service: General;  Laterality: N/A;  . GASTROSTOMY N/A 10/02/2018   Procedure: FEEDING TUBE PLACEMENT;  Surgeon: [name], MD;  Location: [place];  Service: General;  Laterality: N/A;  . LAPAROSCOPY N/A 04/13/2018   Procedure: LAPAROSCOPY DIAGNOSTIC;  Surgeon: [name], MD;  Location: [facility];  Service: General;  Laterality: N/A;  . LAPAROSCOPY N/A 10/02/2018   Procedure: LAPAROSCOPY  DIAGNOSTIC;  Surgeon: [name], MD;  Location: [place];  Service: General;  Laterality: N/A;  . MASTECTOMY  2007  . PORTACATH PLACEMENT Left 04/13/2018   Procedure: INSERTION PORT-A-CATH ERAS  PATHWAY;  Surgeon: [name], MD;  Location: [facility];  Service: General;  Laterality: Left;  subclavian    I have reviewed the social history and family history with the patient and they are unchanged from previous note.  ALLERGIES:  has No Known Allergies.  MEDICATIONS:  Current Outpatient Medications  Medication Sig Dispense Refill  . acyclovir (ZOVIRAX) 400 MG tablet Take 1 tablet (400 mg total) by mouth 3 (three) times daily. For five days. As needed for cold sore. (Patient taking differently: Take 400 mg by mouth 3 (three) times daily as needed (cold sores.). ) 15 tablet 0  . albuterol (VENTOLIN HFA) 108 (90 Base) MCG/ACT inhaler Inhale 1-2 puffs into the lungs every 4 (four) hours as needed for wheezing or shortness of breath. 18 g 11  . baclofen (LIORESAL) 10 MG tablet Take 1 tablet (10 mg total) by mouth 4 (four) times daily as needed for muscle spasms. 30 each 0  . calcium carbonate (TUMS - DOSED IN MG ELEMENTAL CALCIUM) 500 MG chewable tablet Chew 1 tablet (200 mg of elemental calcium total) by mouth 3 (three) times daily. 90 tablet 11  . fluticasone-salmeterol (ADVAIR HFA) 115-21 MCG/ACT inhaler Inhale 2 puffs into the lungs 2 (two) times daily. 3 Inhaler 99  . levothyroxine (SYNTHROID) 200 MCG tablet Take 1 tablet (200 mcg total) by mouth daily before breakfast. Need labs prior to further refills! 90 tablet 0  . lidocaine-prilocaine (EMLA) cream Apply 1 application topically daily as needed (prior to port being accessed). 30 g 1  . loperamide (IMODIUM) 2 MG capsule Take 1 capsule (2 mg total) by mouth 2 (two) times daily as needed for diarrhea or loose stools. 30 capsule 0  . vitamin B-12 1000 MCG tablet Take 1 tablet (1,000 mcg total) by mouth daily. 30 tablet 0   No current facility-administered medications for this visit.   Facility-Administered Medications Ordered in Other Visits  Medication Dose Route Frequency Provider Last Rate Last Admin  . cyanocobalamin ((VITAMIN  B-12)) injection 1,000 mcg  1,000 mcg Intramuscular Once Alla Feeling, NP        PHYSICAL EXAMINATION: ECOG PERFORMANCE STATUS: 1 - Symptomatic but completely ambulatory  Vitals:   09/03/19 0941  BP: 132/80  Pulse: 67  Resp: 20  Temp: (!) 97.4 F (36.3 C)  SpO2: 100%   Filed Weights   09/03/19 0941  Weight: 123 lb 8 oz (56 kg)    GENERAL:alert, no distress and comfortable SKIN: skin color, texture, turgor are normal, no rashes or significant lesions EYES: normal, Conjunctiva are pink and non-injected, sclera clear  NECK: supple, thyroid normal size, non-tender, without nodularity LYMPH:  no palpable lymphadenopathy in the cervical, axillary  LUNGS: clear to auscultation and percussion with normal breathing effort HEART: regular rate & rhythm and no murmurs and no lower extremity edema ABDOMEN:abdomen soft, non-tender and normal bowel sounds. No hepatomegaly (+) Surgical incisions healed well Musculoskeletal:no cyanosis of digits and no clubbing  NEURO: alert & oriented x 3 with fluent speech, no focal motor/sensory deficits  LABORATORY DATA:  I have reviewed the data as listed CBC Latest Ref Rng & Units 08/31/2019 06/04/2019 05/07/2019  WBC 4.0 - 10.5 K/uL 5.9 5.2 4.9  Hemoglobin 12.0 - 15.0 g/dL 11.7(L) 11.3(L) 11.3(L)  Hematocrit 36 - 46 %  32.7(L) 32.7(L) 32.8(L)  Platelets 150 - 400 K/uL 158 175 169     CMP Latest Ref Rng & Units 08/31/2019 06/04/2019 05/07/2019  Glucose 70 - 99 mg/dL 85 92 147(H)  BUN 6 - 20 mg/dL 9 9 11   Creatinine 0.44 - 1.00 mg/dL 0.57 0.59 0.62  Sodium 135 - 145 mmol/L 139 138 139  Potassium 3.5 - 5.1 mmol/L 3.9 3.6 3.7  Chloride 98 - 111 mmol/L 104 106 104  CO2 22 - 32 mmol/L 25 27 26   Calcium 8.9 - 10.3 mg/dL 9.6 9.4 9.3  Total Protein 6.5 - 8.1 g/dL 7.3 7.2 7.7  Total Bilirubin 0.3 - 1.2 mg/dL 0.4 0.2(L) 0.2(L)  Alkaline Phos 38 - 126 U/L 78 91 87  AST 15 - 41 U/L 22 25 32  ALT 0 - 44 U/L 24 27 46(H)      RADIOGRAPHIC STUDIES: I  have personally reviewed the radiological images as listed and agreed with the findings in the report. No results found.   ASSESSMENT & PLAN:  Mariah Kemp is a 59 y.o. female with    1. 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 Given her locally advanced disease, she completed 8 cycles ofneo-adjuvant chemoFLOT4. -She underwent total Gastrectomyand lymph node dissectionand had feeding tube placed on 10/02/18.She hadcomplete pathological response with allnodesnegative.  -Given herpCR, I do not recommend moreadjuvant radiation or chemotherapy. Will proceed with surveillance.  -I recommend she keep her PAC in place for at least a year given her risk of recurrence. Will continue flushes every4-8weeks.  -Her weight is trending down lately and more week. She notes her food gets stuck in her throat mildly. Her work up with Dr Barry Dienes shows adequate swallowing but has possible stricture at her anastomosis. This sensation occurs with soft and solid foods. I discussed seeing GI Dr Loletha Carrow for possible procedure. -We discussed her CT CAP from 08/31/19 which is stable with no new or progressive findings. She has numerous b/l nodularity which are stable and likely benign. Will monitor.  -Labs reviewed, CBC and CMP WNL except Hg 11.7. Physical exam unremarkable. Continue Surveillance. Next scan in 6 months.  -F/u in 4 months. She will f/u with Dr Barry Dienes in 2 months.    2.Weight loss, Mild to moderate Malnutrition, Acid reflux  -With surgery she has feeding tube in place on 10/02/18.Shepreviouslyswitched from Osmolite due to acid reflux and nausea. -Given her sensation of food getting stuck with eating, she has been snacking and not eating as much. Her weight continues to trend down and she is getting weaker.  -I encouraged her to start nutritional supplements and work on gaining weight.   3. Anemia,B12  deficiency S/p gastric surgery -Secondary to GI blood loss  -Given 3u blood transfusion on 1/15/20ands/p 2 doses of IV iron in 03/2018, she responded moderately wellwith improved anemia. -started monthly B12 injections in 01/2019. -She has been taking Geritol for iron. I previously recommended Prenatal vitamins once daily. -After receiving 2 doses of IV Ferahame in 2-05/2019, her 05/2019 Ferritin was elevated at 1355 and B12 225.  -Labs from this week show mild and stable anemia, Ferritin 925 (08/31/19). -Continue Monthly B12 injection. I prescribed home B12 injections to continue inbetween visits (09/03/19)  4. H/o stage 3 right breast cancer in 2007 -Treated with right mastectomy, reconstruction with 3 LN removed. She underwent chemotherapy for 5 months, likely AC-T. She completed Tamoxifen for at least 5 years.   5.  GeneticTesting was not pursued for now due to cost  6. HTN andhypothyroidism -We will hold hydrochlorothiazide for now during the chemo -HTNverywell controlled -Continue to follow-up with PCP   PLAN: -B12 injection today  -I will prescrib home B12 injections to continue inbetween visits.  -CT CAP reviewed, NED  -Flush in 2 months  -Lab, flush, F/u and B12 injection in 4 months  -Copy note to Dr Loletha Carrow and Barry Dienes, I suggest her to see Dr. Barry Dienes in 2 months    No problem-specific Assessment & Plan notes found for this encounter.   Orders Placed This Encounter  Procedures  . Vitamin B12    Standing Status:   Future    Standing Expiration Date:   09/02/2020   All questions were answered. The patient knows to call the clinic with any problems, questions or concerns. No barriers to learning was detected. The total time spent in the appointment was 30 minutes.     [name], MD 09/03/2019   I, Joslyn Devon, am acting as scribe for [name], MD.   I have reviewed the above documentation for accuracy and completeness, and I agree with the above.

## 2019-09-01 ENCOUNTER — Encounter: Payer: Self-pay | Admitting: Hematology

## 2019-09-01 LAB — CANCER ANTIGEN 19-9: CA 19-9: 9 U/mL (ref 0–35)

## 2019-09-03 ENCOUNTER — Inpatient Hospital Stay: Payer: BC Managed Care – PPO

## 2019-09-03 ENCOUNTER — Encounter: Payer: Self-pay | Admitting: Hematology

## 2019-09-03 ENCOUNTER — Inpatient Hospital Stay (HOSPITAL_BASED_OUTPATIENT_CLINIC_OR_DEPARTMENT_OTHER): Payer: BC Managed Care – PPO | Admitting: Hematology

## 2019-09-03 ENCOUNTER — Other Ambulatory Visit: Payer: Self-pay

## 2019-09-03 VITALS — BP 132/80 | HR 67 | Temp 97.4°F | Resp 20 | Wt 123.5 lb

## 2019-09-03 DIAGNOSIS — D500 Iron deficiency anemia secondary to blood loss (chronic): Secondary | ICD-10-CM

## 2019-09-03 DIAGNOSIS — E538 Deficiency of other specified B group vitamins: Secondary | ICD-10-CM

## 2019-09-03 DIAGNOSIS — C169 Malignant neoplasm of stomach, unspecified: Secondary | ICD-10-CM | POA: Diagnosis not present

## 2019-09-03 DIAGNOSIS — C162 Malignant neoplasm of body of stomach: Secondary | ICD-10-CM

## 2019-09-03 DIAGNOSIS — I10 Essential (primary) hypertension: Secondary | ICD-10-CM | POA: Diagnosis not present

## 2019-09-03 DIAGNOSIS — Z95828 Presence of other vascular implants and grafts: Secondary | ICD-10-CM

## 2019-09-03 LAB — VITAMIN B12: Vitamin B-12: >7500 pg/mL — ABNORMAL HIGH (ref 180–914)

## 2019-09-03 MED ORDER — CYANOCOBALAMIN 1000 MCG/ML IJ SOLN
1000.0000 ug | INTRAMUSCULAR | 2 refills | Status: DC
Start: 2019-09-03 — End: 2020-01-31

## 2019-09-03 MED ORDER — CYANOCOBALAMIN 1000 MCG/ML IJ SOLN
INTRAMUSCULAR | Status: AC
  Filled 2019-09-03: qty 1

## 2019-09-03 MED ORDER — SODIUM CHLORIDE 0.9% FLUSH
10.0000 mL | Freq: Once | INTRAVENOUS | Status: AC
  Administered 2019-09-03: 10 mL
  Filled 2019-09-03: qty 10

## 2019-09-03 MED ORDER — CYANOCOBALAMIN 1000 MCG/ML IJ SOLN
1000.0000 ug | Freq: Once | INTRAMUSCULAR | Status: AC
  Administered 2019-09-03: 1000 ug via INTRAMUSCULAR

## 2019-09-03 MED ORDER — HEPARIN SOD (PORK) LOCK FLUSH 100 UNIT/ML IV SOLN
500.0000 [IU] | Freq: Once | INTRAVENOUS | Status: AC
  Administered 2019-09-03: 500 [IU]
  Filled 2019-09-03: qty 5

## 2019-09-06 ENCOUNTER — Telehealth: Payer: Self-pay | Admitting: Hematology

## 2019-09-06 NOTE — Telephone Encounter (Signed)
Scheduled per 6/18 los. Unable to reach pt. Left voicemail- appts added on 8/18 and 10/18.

## 2019-09-09 ENCOUNTER — Encounter: Payer: Self-pay | Admitting: Hematology

## 2019-09-10 ENCOUNTER — Other Ambulatory Visit: Payer: Self-pay | Admitting: *Deleted

## 2019-09-10 MED ORDER — NEEDLES & SYRINGES MISC: 99 refills | Status: DC

## 2019-09-14 ENCOUNTER — Encounter: Payer: Self-pay | Admitting: Osteopathic Medicine

## 2019-09-16 DIAGNOSIS — C162 Malignant neoplasm of body of stomach: Secondary | ICD-10-CM | POA: Diagnosis not present

## 2019-09-17 LAB — TSH: TSH: 0.07 mIU/L — ABNORMAL LOW (ref 0.40–4.50)

## 2019-09-19 ENCOUNTER — Other Ambulatory Visit: Payer: Self-pay | Admitting: Osteopathic Medicine

## 2019-09-22 ENCOUNTER — Other Ambulatory Visit: Payer: Self-pay | Admitting: Osteopathic Medicine

## 2019-09-22 DIAGNOSIS — E039 Hypothyroidism, unspecified: Secondary | ICD-10-CM

## 2019-09-22 MED ORDER — LEVOTHYROXINE SODIUM 175 MCG PO TABS: 175.0000 ug | ORAL_TABLET | Freq: Every day | ORAL | 0 refills | Status: DC

## 2019-10-19 ENCOUNTER — Telehealth: Payer: Self-pay | Admitting: *Deleted

## 2019-10-19 NOTE — Telephone Encounter (Signed)
"  Mariah Kemp [phone]).  Trying to find out if my disability insurance company has called or notified Dr. Burr Medico of a form I need her to sign.  I can fax it or do I need to drop it off?"  "Fax completed form to [facility] fax number on the form.  Mail originals to my home."  Provided fax number [phone] with this call.  Asked to please allow 7 to 10 business days for processing forms.  Denies further current questions or needs.  Awaiting receipt of financial insurance disability form from patient.

## 2019-10-20 NOTE — Telephone Encounter (Signed)
Form received 10/20/2019.

## 2019-10-28 ENCOUNTER — Telehealth: Payer: Self-pay | Admitting: *Deleted

## 2019-10-28 NOTE — Telephone Encounter (Signed)
Connected with gentleman in claims department.   Bad connection.  Ended call after five minute wait communicating no response or audible sounds.    Re-sent original fax request for disability claim completed by patient as N/A with addendum for chemotherapy treatment dates (04/15/2018 through 07/22/2018) and weekly B12 injections as current/ongoing.

## 2019-10-28 NOTE — Telephone Encounter (Signed)
-----   Message from Royston Bake, RN sent at 10/27/2019  3:07 PM EDT ----- Regarding: disability [place],   Someone from [employer] group (I think that is what she said) left a vm stating she needs more information regarding dates of treatment. Call back number is [phone].  Thanks  Tollie Pizza

## 2019-11-03 ENCOUNTER — Other Ambulatory Visit: Payer: Self-pay

## 2019-11-03 ENCOUNTER — Inpatient Hospital Stay: Payer: BC Managed Care – PPO | Attending: Hematology

## 2019-11-03 DIAGNOSIS — Z452 Encounter for adjustment and management of vascular access device: Secondary | ICD-10-CM | POA: Diagnosis not present

## 2019-11-03 DIAGNOSIS — C162 Malignant neoplasm of body of stomach: Secondary | ICD-10-CM | POA: Diagnosis not present

## 2019-11-03 DIAGNOSIS — D500 Iron deficiency anemia secondary to blood loss (chronic): Secondary | ICD-10-CM

## 2019-11-03 DIAGNOSIS — Z95828 Presence of other vascular implants and grafts: Secondary | ICD-10-CM

## 2019-11-03 MED ORDER — HEPARIN SOD (PORK) LOCK FLUSH 100 UNIT/ML IV SOLN
500.0000 [IU] | Freq: Once | INTRAVENOUS | Status: DC
  Filled 2019-11-03: qty 5

## 2019-11-03 MED ORDER — SODIUM CHLORIDE 0.9% FLUSH
10.0000 mL | Freq: Once | INTRAVENOUS | Status: DC
  Filled 2019-11-03: qty 10

## 2019-11-05 LAB — TSH: TSH: 5.67 mIU/L — ABNORMAL HIGH (ref 0.40–4.50)

## 2019-11-12 NOTE — Progress Notes (Signed)
Olin Hauser,   Dr. Loni Muse is out of office. My name is Iran Planas PA-C. Your TSH is elevated indicating a hypo(low) thyroid state are you taking the 186mcg daily in the morning without any other medications or food. If so there is indication to increase that dose and recheck in 6 weeks. I know last TSH you were low meaning too much thyroid hormone. How much were you taking then?   -Luvenia Starch

## 2019-11-16 ENCOUNTER — Other Ambulatory Visit: Payer: Self-pay | Admitting: Osteopathic Medicine

## 2019-11-16 DIAGNOSIS — E039 Hypothyroidism, unspecified: Secondary | ICD-10-CM

## 2019-11-16 MED ORDER — LEVOTHYROXINE SODIUM 200 MCG PO TABS: 200.0000 ug | ORAL_TABLET | Freq: Every day | ORAL | 0 refills | Status: DC

## 2019-12-27 DIAGNOSIS — Z903 Acquired absence of stomach [part of]: Secondary | ICD-10-CM | POA: Diagnosis not present

## 2019-12-27 DIAGNOSIS — R109 Unspecified abdominal pain: Secondary | ICD-10-CM | POA: Diagnosis not present

## 2019-12-27 DIAGNOSIS — R1310 Dysphagia, unspecified: Secondary | ICD-10-CM | POA: Diagnosis not present

## 2019-12-27 DIAGNOSIS — C160 Malignant neoplasm of cardia: Secondary | ICD-10-CM | POA: Diagnosis not present

## 2020-01-03 ENCOUNTER — Other Ambulatory Visit: Payer: BC Managed Care – PPO

## 2020-01-03 ENCOUNTER — Ambulatory Visit: Payer: BC Managed Care – PPO | Admitting: Hematology

## 2020-01-05 NOTE — Progress Notes (Signed)
[place]   [phone] [phone]   Clinic Follow up Note   Patient Care Team: Emeterio Reeve, DO as PCP - General (Osteopathic Medicine) [name], MD as Consulting Physician (General Surgery) [name], MD as Consulting Physician (Hematology) Alla Feeling, NP as Nurse Practitioner (Nurse Practitioner)  Date of Service:  01/07/2020  CHIEF COMPLAINT:  f/u of Gastric Cancer  SUMMARY OF ONCOLOGIC HISTORY: Oncology History Overview Note  Cancer Staging Gastric cancer [facility]) Staging form: Stomach, AJCC 8th Edition - Clinical stage from 03/29/2018: Stage IVA (cT4b, cN1, cM0) - Signed by [name], MD on 04/03/2018 - Pathologic stage from 10/02/2018: ypT0, pN0, cM0 - Signed by Alla Feeling, NP on 01/19/2019     Gastric cancer ([place])  03/19/2018 Procedure   Upper Endoscopy by Dr. Michail Sermon 03/29/18  IMPRESSION - Normal esophagus. - Z-line regular, 38 cm from the incisors. - Non-obstructing oozing gastric ulcer with pigmented material. Biopsied. - Normal examined duodenum. - Acute gastritis.   03/29/2018 Procedure   Colonoscopy by Dr. Michail Sermon 03/29/18 IMPRESSION - Preparation of the colon was fair. - Internal hemorrhoids. - The examined portion of the ileum was normal. - No specimens collected.   03/29/2018 Initial Biopsy   Diagnosis 03/29/18 Stomach, biopsy, Proximal - ADENOCARCINOMA. - GOBLET CELL METAPLASIA. - SEE COMMENT. Microscopic Comment A Warthin Starry stain is negative for the presence of Helicobacter pylori organisms. Dr Vic Ripper has reviewed the case and concurs with this interpretation. Dr Michail Sermon was paged on 03/31/2018. (JBK:ecj 03/31/2018)   03/29/2018 Cancer Staging   Staging form: Stomach, AJCC 8th Edition - Clinical stage from 03/29/2018: Stage IVA (cT4b, cN1, cM0) - Signed by [name], MD on 04/03/2018   04/02/2018 Initial Diagnosis   Gastric cancer ([place])   04/02/2018 Imaging   CT CAP W Contrast  04/02/18 IMPRESSION: 1. Focal thickening lateral wall proximal stomach with protrusion of low-attenuation soft tissue beyond the expected confines of the gastric wall into the splenic hilum. Imaging features highly concerning for transmural tumor extension into the splenic hilum. Several small nodules in this region are likely lymph nodes, concerning for metastatic disease. 2. Scattered bilateral pulmonary nodules measuring up to 5 mm. Nonspecific, but close attention on follow-up recommended as metastatic disease not excluded. 3. Small lymph nodes in the gastrohepatic ligament, but there is no gastrohepatic or hepato duodenal ligament lymphadenopathy. No evidence for liver metastases. 4.  Aortic Atherosclerois (ICD10-170.0   04/15/2018 - 07/22/2018 Chemotherapy   FLOT4 every 2 weeks starting 04/15/18. She completed 8 cycles on 07/22/18.   08/21/2018 Imaging   CT CAP W Contrast  IMPRESSION: 1. No evidence of residual gastric mass. No abdominopelvic adenopathy. 2. Similar bilateral pulmonary nodules, indeterminate. 3. Development of upper lung and peribronchovascular predominant pulmonary opacities. Differential considerations include drug toxicity or atypical infection. Lymphangitic tumor spread felt less likely but cannot be excluded. Consider chest CT follow-up at 3-6 months. 4.  Aortic Atherosclerosis (ICD10-I70.0).    10/02/2018 Surgery   LAPAROSCOPY DIAGNOSTIC, TOTAL GASTRECTOMY, FEEDING TUBE PLACEMENT by Dr. 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 - ADENOCARCINOMA IS NOT IDENTIFIED. - THERE IS NO EVIDENCE OF CARCINOMA IN 34 OF 34 LYMPH NODES (0/34). - SEE ONCOLOGY TABLE BELOW.   10/02/2018 Cancer Staging   Staging form: Stomach, AJCC 8th Edition - Pathologic stage from 10/02/2018: ypT0, pN0, cM0 - Signed by Alla Feeling, NP on  01/19/2019    01/20/2019 Survivorship   Per Cira Rue, NP    03/10/2019 Imaging   CT CAP W contrast IMPRESSION: 1. Interval Roux-en-Y esophagojejunostomy with no the definite findings of metastatic disease in the chest, abdomen, or pelvis. 2. Multiple irregular bilateral pulmonary nodules are smaller than on the previous study. This may be infectious/inflammatory etiology but continued close attention on follow-up recommended. 3. Central mesenteric lymph nodes measure upper normal for size and are slightly increased since the preoperative exam. Attention on follow-up recommended. 4.  Aortic Atherosclerois (ICD10-170.0)   08/31/2019 Imaging   CT CAP w contrast  IMPRESSION: 1. Stable exam. No new or progressive findings in the chest, abdomen, or pelvis. 2. Numerous bilateral irregular nodular opacities in both lungs are stable in the interval. No new suspicious pulmonary nodule or mass. 3. Stable borderline enlarged portocaval lymph node. 4. Aortic Atherosclerosis (ICD10-I70.0).      CURRENT THERAPY:  Surveillance  INTERVAL HISTORY:  Mariah Kemp is here for a follow up of gastric cancer. She presents to the clinic alone. She notes she is clinically doing well. She notes her weight fluctuates but overall stable in the past 3 months. She is eating frequent meals but appetite is fair. Any large amount of food intake can lead to vomiting. She notes Dr Ival Bible referred her for esophageal stretching.  She notes due to insurance Dr Loletha Carrow is her new GI.  She notes cold or room temperature hurts her so she drinks hot water and tea she can keep down. She tries to not drink while she drinks. She drinks 8-12 ounces of liquid a day. She notes her drinking symptoms were present after surgery but improved. It recently progressed in the past month. She notes right upper back pain only when eating, which she is not sure if this is related to gas. Dr Barry Dienes also referred her to PT given tightness of  abdominal muscle.    REVIEW OF SYSTEMS:   Constitutional: Denies fevers, chills or abnormal weight loss Eyes: Denies blurriness of vision Ears, nose, mouth, throat, and face: Denies mucositis or sore throat Respiratory: Denies cough, dyspnea or wheezes Cardiovascular: Denies palpitation, chest discomfort or lower extremity swelling Gastrointestinal:  Denies nausea, heartburn or change in bowel habits (+) occasional post prandial vomiting  Skin: Denies abnormal skin rashes MKS: (+) Right upper back pain post prandial  Lymphatics: Denies new lymphadenopathy or easy bruising Neurological:Denies numbness, tingling or new weaknesses Behavioral/Psych: Mood is stable, no new changes  All other systems were reviewed with the patient and are negative.  MEDICAL HISTORY:  Past Medical History:  Diagnosis Date   Anemia    Asthma    Breast cancer ([place])    Dyspnea    over exertion   Hypertension    Hypothyroidism    Stomach cancer [facility])     SURGICAL HISTORY: Past Surgical History:  Procedure Laterality Date   ABDOMINAL HYSTERECTOMY  1996   BARIATRIC SURGERY     BILATERAL SALPINGOOPHORECTOMY  2007   BIOPSY  03/29/2018   Procedure: BIOPSY;  Surgeon: Wilford Corner, MD;  Location: [place];  Service: Endoscopy;;   CARPAL TUNNEL RELEASE     COLONOSCOPY N/A 03/29/2018   Procedure: COLONOSCOPY;  Surgeon: Wilford Corner, MD;  Location: [place];  Service: Endoscopy;  Laterality: N/A;   ESOPHAGOGASTRODUODENOSCOPY N/A 03/29/2018   Procedure: ESOPHAGOGASTRODUODENOSCOPY (EGD);  Surgeon: Wilford Corner, MD;  Location: [place];  Service: Endoscopy;  Laterality: N/A;   GASTRECTOMY N/A 10/02/2018   Procedure:  TOTAL GASTRECTOMY;  Surgeon: [name], MD;  Location: [place];  Service: General;  Laterality: N/A;   GASTROSTOMY N/A 10/02/2018   Procedure: FEEDING TUBE PLACEMENT;  Surgeon: [name], MD;  Location: [place];  Service: General;  Laterality: N/A;    LAPAROSCOPY N/A 04/13/2018   Procedure: LAPAROSCOPY DIAGNOSTIC;  Surgeon: [name], MD;  Location: [facility];  Service: General;  Laterality: N/A;   LAPAROSCOPY N/A 10/02/2018   Procedure: LAPAROSCOPY DIAGNOSTIC;  Surgeon: [name], MD;  Location: [place];  Service: General;  Laterality: N/A;   MASTECTOMY  2007   PORTACATH PLACEMENT Left 04/13/2018   Procedure: INSERTION [place];  Surgeon: [name], MD;  Location: [facility];  Service: General;  Laterality: Left;  subclavian    I have reviewed the social history and family history with the patient and they are unchanged from previous note.  ALLERGIES:  has No Known Allergies.  MEDICATIONS:  Current Outpatient Medications  Medication Sig Dispense Refill   acyclovir (ZOVIRAX) 400 MG tablet Take 1 tablet (400 mg total) by mouth 3 (three) times daily. For five days. As needed for cold sore. (Patient taking differently: Take 400 mg by mouth 3 (three) times daily as needed (cold sores.). ) 15 tablet 0   albuterol (VENTOLIN HFA) 108 (90 Base) MCG/ACT inhaler Inhale 1-2 puffs into the lungs every 4 (four) hours as needed for wheezing or shortness of breath. 18 g 11   baclofen (LIORESAL) 10 MG tablet Take 1 tablet (10 mg total) by mouth 4 (four) times daily as needed for muscle spasms. 30 each 0   calcium carbonate (TUMS - DOSED IN MG ELEMENTAL CALCIUM) 500 MG chewable tablet Chew 1 tablet (200 mg of elemental calcium total) by mouth 3 (three) times daily. 90 tablet 11   cyanocobalamin (,VITAMIN B-12,) 1000 MCG/ML injection Inject 1 mL (1,000 mcg total) into the muscle once a week. 4 mL 2   fluticasone-salmeterol (ADVAIR HFA) 115-21 MCG/ACT inhaler Inhale 2 puffs into the lungs 2 (two) times daily. 3 Inhaler 99   levothyroxine (SYNTHROID) 200 MCG tablet Take 1 tablet (200 mcg total) by mouth daily before breakfast. Need labs prior to further refills! 60 tablet 0   lidocaine-prilocaine (EMLA) cream Apply 1 application  topically daily as needed (prior to port being accessed). 30 g 1   loperamide (IMODIUM) 2 MG capsule Take 1 capsule (2 mg total) by mouth 2 (two) times daily as needed for diarrhea or loose stools. 30 capsule 0   Needles & Syringes MISC 25 ga x 1 " IM needle/syringe 6 each prn   vitamin B-12 1000 MCG tablet Take 1 tablet (1,000 mcg total) by mouth daily. 30 tablet 0   No current facility-administered medications for this visit.    PHYSICAL EXAMINATION: ECOG PERFORMANCE STATUS: 1 - Symptomatic but completely ambulatory  Vitals:   01/07/20 0943  BP: 128/70  Pulse: (!) 58  Resp: 17  Temp: (!) 96.6 F (35.9 C)  SpO2: 100%   Filed Weights   01/07/20 0943  Weight: 120 lb (54.4 kg)    GENERAL:alert, no distress and comfortable SKIN: skin color, texture, turgor are normal, no rashes or significant lesions EYES: normal, Conjunctiva are pink and non-injected, sclera clear  NECK: supple, thyroid normal size, non-tender, without nodularity LYMPH:  no palpable lymphadenopathy in the cervical, axillary  LUNGS: clear to auscultation and percussion with normal breathing effort HEART: regular rate & rhythm and no murmurs and no lower extremity edema ABDOMEN:abdomen soft, non-tender  and normal bowel sounds (+) Right abdominal tenderness  Musculoskeletal:no cyanosis of digits and no clubbing (+) Tenderness over right scapula NEURO: alert & oriented x 3 with fluent speech, no focal motor/sensory deficits  LABORATORY DATA:  I have reviewed the data as listed CBC Latest Ref Rng & Units 01/07/2020 08/31/2019 06/04/2019  WBC 4.0 - 10.5 K/uL 5.3 5.9 5.2  Hemoglobin 12.0 - 15.0 g/dL 10.6(L) 11.7(L) 11.3(L)  Hematocrit 36 - 46 % 30.3(L) 32.7(L) 32.7(L)  Platelets 150 - 400 K/uL 151 158 175     CMP Latest Ref Rng & Units 01/07/2020 08/31/2019 06/04/2019  Glucose 70 - 99 mg/dL 73 85 92  BUN 6 - 20 mg/dL 11 9 9   Creatinine 0.44 - 1.00 mg/dL 0.51 0.57 0.59  Sodium 135 - 145 mmol/L 140 139 138   Potassium 3.5 - 5.1 mmol/L 3.8 3.9 3.6  Chloride 98 - 111 mmol/L 107 104 106  CO2 22 - 32 mmol/L 27 25 27   Calcium 8.9 - 10.3 mg/dL 9.2 9.6 9.4  Total Protein 6.5 - 8.1 g/dL 6.6 7.3 7.2  Total Bilirubin 0.3 - 1.2 mg/dL 0.2(L) 0.4 0.2(L)  Alkaline Phos 38 - 126 U/L 78 78 91  AST 15 - 41 U/L 18 22 25   ALT 0 - 44 U/L 14 24 27       RADIOGRAPHIC STUDIES: I have personally reviewed the radiological images as listed and agreed with the findings in the report. No results found.   ASSESSMENT & PLAN:  Mariah Kemp is a 59 y.o. female with   1. 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 Given her locally advanced disease, she completed 8 cycles ofneo-adjuvant chemoFLOT4. -She underwent total Gastrectomyand lymph node dissectionand had feeding tube placed on 10/02/18.She hadcomplete pathological response with allnodesnegative.  -Given herpCR, I do not recommend moreadjuvant radiation or chemotherapy. Will proceed with surveillance.  -I recommend she keep her PAC in place for at least a year given her risk of recurrence. Will continue flushes every4-8weeks. -She is clinically doing well, but notes decreased liquid intake as she can only tolerate hot liquids, right upper back pain and occasional post prandial vomiting based on how much she eats. Labs reviewed, Hg 10.6. Physical exam showed Right abdominal tenderness and tenderness over right scapula.  -I recommend CT CAP and Endoscopy with Dr. Loletha Carrow soon for further evaluation of recent symptoms. She is agreeable -Phone call after scan.   2.Weight loss s/p gastric surgery, Dehydration, Acid reflux, Right upper back pain -Her weight has fluctuated lately, but mostly stable with small frequent meals.  -She notes pain with swallowing cold and room tempeture liquids after her surgery. This did improve but recently progressed again in the past month.   -Given post prandial vomiting based on how much she eats, Dr Barry Dienes referred her for esophageal stretching and to PT given right abdominal muscle tightness. -She notes recent right upper back pain mainly with eating. I recommend endoscopy and CT CAP for further evaluation. She is agreeable.    3. Anemia,B12 deficiency S/p gastric surgery  -Given 3u blood transfusion on 1/15/20andIV Feraheme in 03/2018 and  2-05/2019. -Started monthly B12 injections in 01/2019.Switched to home injections after 09/03/19 -Anemia has been mild and stable lately.   4. H/o stage 3 right breast cancer in 2007, GeneticTesting was not pursued for now due to cost -Treated with right mastectomy, reconstruction with 3 LN removed. She underwent chemotherapy for 5 months, likely AC-T. She  completed Tamoxifen for at least 5 years.   5. HTN andhypothyroidism -Continue to follow-up with PCP   PLAN: -CT CAP W contrast next week, phone call 2 days after.  -Send message to Dr Loletha Carrow about endoscopy, copy Dr Barry Dienes    No problem-specific Assessment & Plan notes found for this encounter.   Orders Placed This Encounter  Procedures   CT Abdomen Pelvis W Contrast    Standing Status:   Future    Standing Expiration Date:   01/06/2021    Order Specific Question:   If indicated for the ordered procedure, I authorize the administration of contrast media per Radiology protocol    Answer:   Yes    Order Specific Question:   Is patient pregnant?    Answer:   No    Order Specific Question:   Preferred imaging location?    Answer:   [facility]    Order Specific Question:   Release to patient    Answer:   Immediate    Order Specific Question:   Is Oral Contrast requested for this exam?    Answer:   Yes, Per Radiology protocol   CT Chest W Contrast    New right scapular pain and tenderness, rule out metastasis    Standing Status:   Future    Standing Expiration Date:   01/06/2021    Order Specific  Question:   If indicated for the ordered procedure, I authorize the administration of contrast media per Radiology protocol    Answer:   Yes    Order Specific Question:   Is patient pregnant?    Answer:   No    Order Specific Question:   Preferred imaging location?    Answer:   [facility]    Order Specific Question:   Release to patient    Answer:   Immediate   All questions were answered. The patient knows to call the clinic with any problems, questions or concerns. No barriers to learning was detected. The total time spent in the appointment was 30 minutes.     [name], MD 01/07/2020   I, Joslyn Devon, am acting as scribe for [name], MD.   I have reviewed the above documentation for accuracy and completeness, and I agree with the above.

## 2020-01-07 ENCOUNTER — Encounter: Payer: Self-pay | Admitting: Hematology

## 2020-01-07 ENCOUNTER — Other Ambulatory Visit: Payer: Self-pay

## 2020-01-07 ENCOUNTER — Inpatient Hospital Stay: Payer: BC Managed Care – PPO

## 2020-01-07 ENCOUNTER — Inpatient Hospital Stay (HOSPITAL_BASED_OUTPATIENT_CLINIC_OR_DEPARTMENT_OTHER): Payer: BC Managed Care – PPO | Admitting: Hematology

## 2020-01-07 ENCOUNTER — Inpatient Hospital Stay: Payer: BC Managed Care – PPO | Attending: Hematology

## 2020-01-07 VITALS — BP 128/70 | HR 58 | Temp 96.6°F | Resp 17 | Ht 61.0 in | Wt 120.0 lb

## 2020-01-07 DIAGNOSIS — Z9011 Acquired absence of right breast and nipple: Secondary | ICD-10-CM | POA: Diagnosis not present

## 2020-01-07 DIAGNOSIS — D649 Anemia, unspecified: Secondary | ICD-10-CM | POA: Diagnosis not present

## 2020-01-07 DIAGNOSIS — E039 Hypothyroidism, unspecified: Secondary | ICD-10-CM | POA: Insufficient documentation

## 2020-01-07 DIAGNOSIS — I10 Essential (primary) hypertension: Secondary | ICD-10-CM | POA: Insufficient documentation

## 2020-01-07 DIAGNOSIS — Z95828 Presence of other vascular implants and grafts: Secondary | ICD-10-CM

## 2020-01-07 DIAGNOSIS — K219 Gastro-esophageal reflux disease without esophagitis: Secondary | ICD-10-CM | POA: Diagnosis not present

## 2020-01-07 DIAGNOSIS — Z9884 Bariatric surgery status: Secondary | ICD-10-CM | POA: Insufficient documentation

## 2020-01-07 DIAGNOSIS — E538 Deficiency of other specified B group vitamins: Secondary | ICD-10-CM | POA: Insufficient documentation

## 2020-01-07 DIAGNOSIS — Z9221 Personal history of antineoplastic chemotherapy: Secondary | ICD-10-CM | POA: Insufficient documentation

## 2020-01-07 DIAGNOSIS — D500 Iron deficiency anemia secondary to blood loss (chronic): Secondary | ICD-10-CM

## 2020-01-07 DIAGNOSIS — C162 Malignant neoplasm of body of stomach: Secondary | ICD-10-CM | POA: Diagnosis not present

## 2020-01-07 DIAGNOSIS — C169 Malignant neoplasm of stomach, unspecified: Secondary | ICD-10-CM

## 2020-01-07 DIAGNOSIS — Z853 Personal history of malignant neoplasm of breast: Secondary | ICD-10-CM | POA: Insufficient documentation

## 2020-01-07 LAB — CBC WITH DIFFERENTIAL (CANCER CENTER ONLY)
Abs Immature Granulocytes: 0.01 10*3/uL (ref 0.00–0.07)
Basophils Absolute: 0 10*3/uL (ref 0.0–0.1)
Basophils Relative: 1 %
Eosinophils Absolute: 0.4 10*3/uL (ref 0.0–0.5)
Eosinophils Relative: 8 %
HCT: 30.3 % — ABNORMAL LOW (ref 36.0–46.0)
Hemoglobin: 10.6 g/dL — ABNORMAL LOW (ref 12.0–15.0)
Immature Granulocytes: 0 %
Lymphocytes Relative: 43 %
Lymphs Abs: 2.3 10*3/uL (ref 0.7–4.0)
MCH: 32.7 pg (ref 26.0–34.0)
MCHC: 35 g/dL (ref 30.0–36.0)
MCV: 93.5 fL (ref 80.0–100.0)
Monocytes Absolute: 0.5 10*3/uL (ref 0.1–1.0)
Monocytes Relative: 9 %
Neutro Abs: 2.1 10*3/uL (ref 1.7–7.7)
Neutrophils Relative %: 39 %
Platelet Count: 151 10*3/uL (ref 150–400)
RBC: 3.24 MIL/uL — ABNORMAL LOW (ref 3.87–5.11)
RDW: 11.7 % (ref 11.5–15.5)
WBC Count: 5.3 10*3/uL (ref 4.0–10.5)
nRBC: 0 % (ref 0.0–0.2)

## 2020-01-07 LAB — CMP (CANCER CENTER ONLY)
ALT: 14 U/L (ref 0–44)
AST: 18 U/L (ref 15–41)
Albumin: 3.4 g/dL — ABNORMAL LOW (ref 3.5–5.0)
Alkaline Phosphatase: 78 U/L (ref 38–126)
Anion gap: 6 (ref 5–15)
BUN: 11 mg/dL (ref 6–20)
CO2: 27 mmol/L (ref 22–32)
Calcium: 9.2 mg/dL (ref 8.9–10.3)
Chloride: 107 mmol/L (ref 98–111)
Creatinine: 0.51 mg/dL (ref 0.44–1.00)
GFR, Estimated: >60 mL/min (ref >60)
Glucose, Bld: 73 mg/dL (ref 70–99)
Potassium: 3.8 mmol/L (ref 3.5–5.1)
Sodium: 140 mmol/L (ref 135–145)
Total Bilirubin: 0.2 mg/dL — ABNORMAL LOW (ref 0.3–1.2)
Total Protein: 6.6 g/dL (ref 6.5–8.1)

## 2020-01-07 LAB — VITAMIN B12: Vitamin B-12: 324 pg/mL (ref 180–914)

## 2020-01-07 LAB — FERRITIN: Ferritin: 871 ng/mL — ABNORMAL HIGH (ref 11–307)

## 2020-01-07 MED ORDER — HEPARIN SOD (PORK) LOCK FLUSH 100 UNIT/ML IV SOLN
500.0000 [IU] | Freq: Once | INTRAVENOUS | Status: AC
  Administered 2020-01-07: 500 [IU]
  Filled 2020-01-07: qty 5

## 2020-01-07 MED ORDER — SODIUM CHLORIDE 0.9% FLUSH
10.0000 mL | Freq: Once | INTRAVENOUS | Status: AC
  Administered 2020-01-07: 10 mL
  Filled 2020-01-07: qty 10

## 2020-01-07 NOTE — Patient Instructions (Signed)

## 2020-01-08 LAB — CANCER ANTIGEN 19-9: CA 19-9: 8 U/mL (ref 0–35)

## 2020-01-10 ENCOUNTER — Telehealth: Payer: Self-pay | Admitting: Hematology

## 2020-01-10 NOTE — Telephone Encounter (Signed)
Scheduled appointment per 10/22 los. Spoke to patient who is aware of appointment date and time.  

## 2020-01-12 ENCOUNTER — Telehealth: Payer: Self-pay | Admitting: Gastroenterology

## 2020-01-12 NOTE — Telephone Encounter (Signed)
I rec'd a message from Dr. Burr Medico on this patient I saw in clinic Jan of this year.  She has worsening dysphagia and needs an EGD.  Please contact her to get it on my schedule in [place] within next two weeks.  There is currently open time.  Thanks  - HD

## 2020-01-12 NOTE — Telephone Encounter (Signed)
Spoke with patient, she has been scheduled for a pre-visit on 01/13/20 at 10:30 AM. Patient is scheduled for an EGD with Dr. Loletha Carrow on 01/26/20 at 1:30 pm with a 12:30 pm arrival time. Patient verbalized understanding and had no other concerns at the end of this call.

## 2020-01-13 ENCOUNTER — Encounter: Payer: Self-pay | Admitting: Gastroenterology

## 2020-01-13 ENCOUNTER — Other Ambulatory Visit: Payer: Self-pay

## 2020-01-13 ENCOUNTER — Ambulatory Visit (AMBULATORY_SURGERY_CENTER): Payer: Self-pay | Admitting: *Deleted

## 2020-01-13 VITALS — Ht 61.0 in | Wt 118.8 lb

## 2020-01-13 DIAGNOSIS — R1319 Other dysphagia: Secondary | ICD-10-CM

## 2020-01-13 NOTE — Progress Notes (Signed)
Completed covid vaccines 07-05-19  Pt is aware that care partner will wait in the car during procedure; if they feel like they will be too hot or cold to wait in the car; they may wait in the 4 th floor lobby. Patient is aware to bring only one care partner. We want them to wear a mask (we do not have any that we can provide them), practice social distancing, and we will check their temperatures when they get here.  I did remind the patient that their care partner needs to stay in the parking lot the entire time and have a cell phone available, we will call them when the pt is ready for discharge. Patient will wear mask into building.   No trouble with anesthesia, difficulty with intubation or hx/fam hx of malignant hyperthermia per pt   No egg or soy allergy  No home oxygen use   No medications for weight loss taken

## 2020-01-17 NOTE — Progress Notes (Signed)
[place]   [phone] [phone]) [phone]   Clinic Follow up Note   Patient Care Team: Emeterio Reeve, DO as PCP - General (Osteopathic Medicine) [name], MD as Consulting Physician (General Surgery) [name], MD as Consulting Physician (Hematology) Alla Feeling, NP as Nurse Practitioner (Nurse Practitioner)   I connected with Mariah Kemp on 01/20/2020 at  8:00 AM EDT by telephone visit and verified that I am speaking with the correct person using two identifiers.  I discussed the limitations, risks, security and privacy concerns of performing an evaluation and management service by telephone and the availability of in person appointments. I also discussed with the patient that there may be a patient responsible charge related to this service. The patient expressed understanding and agreed to proceed.   Other persons participating in the visit and their role in the encounter:  Home   Patient's location:  home Provider's location:  Office   CHIEF COMPLAINT: F/u of Gastric Cancer  SUMMARY OF ONCOLOGIC HISTORY: Oncology History Overview Note  Cancer Staging Gastric cancer [facility]) Staging form: Stomach, AJCC 8th Edition - Clinical stage from 03/29/2018: Stage IVA (cT4b, cN1, cM0) - Signed by [name], MD on 04/03/2018 - Pathologic stage from 10/02/2018: ypT0, pN0, cM0 - Signed by Alla Feeling, NP on 01/19/2019     Gastric cancer ([place])  03/19/2018 Procedure   Upper Endoscopy by Dr. Michail Sermon 03/29/18  IMPRESSION - Normal esophagus. - Z-line regular, 38 cm from the incisors. - Non-obstructing oozing gastric ulcer with pigmented material. Biopsied. - Normal examined duodenum. - Acute gastritis.   03/29/2018 Procedure   Colonoscopy by Dr. Michail Sermon 03/29/18 IMPRESSION - Preparation of the colon was fair. - Internal hemorrhoids. - The examined portion of the ileum was normal. - No specimens collected.   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 - SEE COMMENT. Microscopic Comment A Warthin Starry stain is negative for the presence of Helicobacter pylori organisms. Dr Vic Ripper has reviewed the case and concurs with this interpretation. Dr Michail Sermon was paged on 03/31/2018. (JBK:ecj 03/31/2018)   03/29/2018 Cancer Staging   Staging form: Stomach, AJCC 8th Edition - Clinical stage from 03/29/2018: Stage IVA (cT4b, cN1, cM0) - Signed by [name], MD on 04/03/2018   04/02/2018 Initial Diagnosis   Gastric cancer ([place])   04/02/2018 Imaging   CT CAP W Contrast 04/02/18 IMPRESSION: 1. Focal thickening lateral wall proximal stomach with protrusion of low-attenuation soft tissue beyond the expected confines of the gastric wall into the splenic hilum. Imaging features highly concerning for transmural tumor extension into the splenic hilum. Several small nodules in this region are likely lymph nodes, concerning for metastatic disease. 2. Scattered bilateral pulmonary nodules measuring up to 5 mm. Nonspecific, but close attention on follow-up recommended as metastatic disease not excluded. 3. Small lymph nodes in the gastrohepatic ligament, but there is no gastrohepatic or hepato duodenal ligament lymphadenopathy. No evidence for liver metastases. 4.  Aortic Atherosclerois (ICD10-170.0   04/15/2018 - 07/22/2018 Chemotherapy   FLOT4 every 2 weeks starting 04/15/18. She completed 8 cycles on 07/22/18.   08/21/2018 Imaging   CT CAP W Contrast  IMPRESSION: 1. No evidence of residual gastric mass. No abdominopelvic adenopathy. 2. Similar bilateral pulmonary nodules, indeterminate. 3. Development of upper lung and peribronchovascular predominant pulmonary opacities. Differential considerations include drug toxicity or atypical infection. Lymphangitic tumor spread felt less likely but cannot be excluded. Consider chest CT follow-up at  3-6 months. 4.  Aortic  Atherosclerosis (ICD10-I70.0).    10/02/2018 Surgery   LAPAROSCOPY DIAGNOSTIC, TOTAL GASTRECTOMY, FEEDING TUBE PLACEMENT by Dr. 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 - ADENOCARCINOMA IS NOT IDENTIFIED. - THERE IS NO EVIDENCE OF CARCINOMA IN 34 OF 34 LYMPH NODES (0/34). - SEE ONCOLOGY TABLE BELOW.   10/02/2018 Cancer Staging   Staging form: Stomach, AJCC 8th Edition - Pathologic stage from 10/02/2018: ypT0, pN0, cM0 - Signed by Alla Feeling, NP on 01/19/2019   01/20/2019 Survivorship   Per Cira Rue, NP    03/10/2019 Imaging   CT CAP W contrast IMPRESSION: 1. Interval Roux-en-Y esophagojejunostomy with no the definite findings of metastatic disease in the chest, abdomen, or pelvis. 2. Multiple irregular bilateral pulmonary nodules are smaller than on the previous study. This may be infectious/inflammatory etiology but continued close attention on follow-up recommended. 3. Central mesenteric lymph nodes measure upper normal for size and are slightly increased since the preoperative exam. Attention on follow-up recommended. 4.  Aortic Atherosclerois (ICD10-170.0)   08/31/2019 Imaging   CT CAP w contrast  IMPRESSION: 1. Stable exam. No new or progressive findings in the chest, abdomen, or pelvis. 2. Numerous bilateral irregular nodular opacities in both lungs are stable in the interval. No new suspicious pulmonary nodule or mass. 3. Stable borderline enlarged portocaval lymph node. 4. Aortic Atherosclerosis (ICD10-I70.0).      CURRENT THERAPY:  Surveillance  INTERVAL HISTORY:  Mariah Kemp is scheduled for a phone visit to discuss her restaging CT scan.  She is clinically stable, still has some pain in the back, for which she was referred to physical therapy.  She has occasional wheezing, no cough or dyspnea, she takes inhalers.  Weight  is stable, no other new complaints.  All other systems were reviewed with the patient and are negative.  MEDICAL HISTORY:  Past Medical History:  Diagnosis Date  . Anemia   . Asthma   . Blood transfusion without reported diagnosis   . Breast cancer ([place])   . Dyspnea    over exertion  . Hypertension   . Hypothyroidism   . Stomach cancer [facility])     SURGICAL HISTORY: Past Surgical History:  Procedure Laterality Date  . ABDOMINAL HYSTERECTOMY  1996  . BARIATRIC SURGERY     stomach removal  . BILATERAL SALPINGOOPHORECTOMY  2007  . BIOPSY  03/29/2018   Procedure: BIOPSY;  Surgeon: Wilford Corner, MD;  Location: [place];  Service: Endoscopy;;  . CARPAL TUNNEL RELEASE    . COLONOSCOPY N/A 03/29/2018   Procedure: COLONOSCOPY;  Surgeon: Wilford Corner, MD;  Location[facility] ENDOSCOPY;  Service: Endoscopy;  Laterality: N/A;  . COLONOSCOPY    . ESOPHAGOGASTRODUODENOSCOPY N/A 03/29/2018   Procedure: ESOPHAGOGASTRODUODENOSCOPY (EGD);  Surgeon: Wilford Corner, MD;  Location: [place];  Service: Endoscopy;  Laterality: N/A;  . GASTRECTOMY N/A 10/02/2018   Procedure: TOTAL GASTRECTOMY;  Surgeon: [name], MD;  Location: [place];  Service: General;  Laterality: N/A;  . GASTROSTOMY N/A 10/02/2018   Procedure: FEEDING TUBE PLACEMENT;  Surgeon: [name], MD;  Location: [place];  Service: General;  Laterality: N/A;  . LAPAROSCOPY N/A 04/13/2018   Procedure: LAPAROSCOPY DIAGNOSTIC;  Surgeon: [name], MD;  Location: [facility];  Service: General;  Laterality: N/A;  . LAPAROSCOPY N/A 10/02/2018   Procedure: LAPAROSCOPY DIAGNOSTIC;  Surgeon: [name], MD;  Location: [facility]  OR;  Service: General;  Laterality: N/A;  . MASTECTOMY  2007  . PORTACATH PLACEMENT Left 04/13/2018   Procedure: INSERTION PORT-A-CATH ERAS PATHWAY;  Surgeon: [name], MD;  Location: [facility];  Service: General;  Laterality: Left;  subclavian  . UPPER GASTROINTESTINAL ENDOSCOPY      I have reviewed the social  history and family history with the patient and they are unchanged from previous note.  ALLERGIES:  has No Known Allergies.  MEDICATIONS:  Current Outpatient Medications  Medication Sig Dispense Refill  . acyclovir (ZOVIRAX) 400 MG tablet Take 1 tablet (400 mg total) by mouth 3 (three) times daily. For five days. As needed for cold sore. (Patient taking differently: Take 400 mg by mouth 3 (three) times daily as needed (cold sores.). ) 15 tablet 0  . albuterol (VENTOLIN HFA) 108 (90 Base) MCG/ACT inhaler Inhale 1-2 puffs into the lungs every 4 (four) hours as needed for wheezing or shortness of breath. 18 g 11  . baclofen (LIORESAL) 10 MG tablet Take 1 tablet (10 mg total) by mouth 4 (four) times daily as needed for muscle spasms. (Patient not taking: Reported on 01/13/2020) 30 each 0  . calcium carbonate (TUMS - DOSED IN MG ELEMENTAL CALCIUM) 500 MG chewable tablet Chew 1 tablet (200 mg of elemental calcium total) by mouth 3 (three) times daily. 90 tablet 11  . cyanocobalamin (,VITAMIN B-12,) 1000 MCG/ML injection Inject 1 mL (1,000 mcg total) into the muscle once a week. 4 mL 2  . fluticasone-salmeterol (ADVAIR HFA) 115-21 MCG/ACT inhaler Inhale 2 puffs into the lungs 2 (two) times daily. 3 Inhaler 99  . levothyroxine (SYNTHROID) 200 MCG tablet Take 1 tablet (200 mcg total) by mouth daily before breakfast. Need labs prior to further refills! 60 tablet 0  . lidocaine-prilocaine (EMLA) cream Apply 1 application topically daily as needed (prior to port being accessed). 30 g 1  . loperamide (IMODIUM) 2 MG capsule Take 1 capsule (2 mg total) by mouth 2 (two) times daily as needed for diarrhea or loose stools. 30 capsule 0  . Needles & Syringes MISC 25 ga x 1 " IM needle/syringe 6 each prn  . vitamin B-12 1000 MCG tablet Take 1 tablet (1,000 mcg total) by mouth daily. 30 tablet 0   No current facility-administered medications for this visit.    PHYSICAL EXAMINATION: ECOG PERFORMANCE STATUS: 1 -  Symptomatic but completely ambulatory  No vitals taken today, Exam not performed today   LABORATORY DATA:  I have reviewed the data as listed CBC Latest Ref Rng & Units 01/07/2020 08/31/2019 06/04/2019  WBC 4.0 - 10.5 K/uL 5.3 5.9 5.2  Hemoglobin 12.0 - 15.0 g/dL 10.6(L) 11.7(L) 11.3(L)  Hematocrit 36 - 46 % 30.3(L) 32.7(L) 32.7(L)  Platelets 150 - 400 K/uL 151 158 175     CMP Latest Ref Rng & Units 01/07/2020 08/31/2019 06/04/2019  Glucose 70 - 99 mg/dL 73 85 92  BUN 6 - 20 mg/dL 11 9 9   Creatinine 0.44 - 1.00 mg/dL 0.51 0.57 0.59  Sodium 135 - 145 mmol/L 140 139 138  Potassium 3.5 - 5.1 mmol/L 3.8 3.9 3.6  Chloride 98 - 111 mmol/L 107 104 106  CO2 22 - 32 mmol/L 27 25 27   Calcium 8.9 - 10.3 mg/dL 9.2 9.6 9.4  Total Protein 6.5 - 8.1 g/dL 6.6 7.3 7.2  Total Bilirubin 0.3 - 1.2 mg/dL 0.2(L) 0.4 0.2(L)  Alkaline Phos 38 - 126 U/L 78 78 91  AST 15 - 41 U/L  18 22 25   ALT 0 - 44 U/L 14 24 27       RADIOGRAPHIC STUDIES: I have personally reviewed the radiological images as listed and agreed with the findings in the report. CT Chest W Contrast  Result Date: 01/18/2020 CLINICAL DATA:  Gastric cancer, diagnosed February 2020, status post gastrectomy, chemotherapy complete. History of breast cancer, status post chemotherapy and surgery. EXAM: CT CHEST, ABDOMEN, AND PELVIS WITH CONTRAST TECHNIQUE: Multidetector CT imaging of the chest, abdomen and pelvis was performed following the standard protocol during bolus administration of intravenous contrast. CONTRAST:  164mL OMNIPAQUE IOHEXOL 300 MG/ML  SOLN COMPARISON:  08/31/2019 FINDINGS: CT CHEST FINDINGS Cardiovascular: The heart is normal in size. No pericardial effusion. Mild atherosclerotic calcifications of the aortic arch. Mild coronary atherosclerosis of the LAD. Left chest port terminates in the mid SVC. Mediastinum/Nodes: No suspicious mediastinal lymphadenopathy. Status post right axillary lymph node dissection. Lungs/Pleura: Scattered  peribronchovascular nodular opacities, upper lobe predominant with bronchial wall thickening and subpleural reticulation, unchanged. Dominant opacity measures 15 x 10 mm in the right upper lobe (series 6/images 30-31), previously 16 x 10 mm when measured in a similar fashion, unchanged. No new/suspicious pulmonary nodules. No focal consolidation. No pleural effusion or pneumothorax. Musculoskeletal: Status post right mastectomy with tram flap reconstruction. Mild degenerative changes of the thoracic spine. No focal osseous lesions. CT ABDOMEN PELVIS FINDINGS Hepatobiliary: Liver is within normal limits. No suspicious/enhancing hepatic lesions. Gallbladder is unremarkable. No intrahepatic or extrahepatic ductal dilatation. Pancreas: Within normal limits. Spleen: Within normal limits. Adrenals/Urinary Tract: Adrenal glands are within normal limits. 6 mm anterior left lower pole renal cyst (series 7/image 19). Left kidney is within normal limits. No hydronephrosis. Bladder is within normal limits. Stomach/Bowel: Status post gastrectomy. No evidence of bowel obstruction. Normal appendix (series 2/image 97). Vascular/Lymphatic: No evidence of abdominal aortic aneurysm. Atherosclerotic calcifications of the abdominal aorta and branch vessels. No suspicious abdominopelvic lymphadenopathy. Reproductive: Status post hysterectomy. No adnexal masses. Other: No abdominopelvic ascites. Postsurgical changes along the anterior abdominal wall. Musculoskeletal: Visualized osseous structures are within normal limits. No focal osseous lesions. IMPRESSION: Status post gastrectomy. Status post right mastectomy with right axillary lymph node dissection. Stable peribronchovascular nodular opacities, as described above, favored to be inflammatory. No findings specific for recurrent or metastatic disease. Electronically Signed   By: Julian Hy M.D.   On: 01/18/2020 12:47   CT Abdomen Pelvis W Contrast  Result Date:  01/18/2020 CLINICAL DATA:  Gastric cancer, diagnosed February 2020, status post gastrectomy, chemotherapy complete. History of breast cancer, status post chemotherapy and surgery. EXAM: CT CHEST, ABDOMEN, AND PELVIS WITH CONTRAST TECHNIQUE: Multidetector CT imaging of the chest, abdomen and pelvis was performed following the standard protocol during bolus administration of intravenous contrast. CONTRAST:  127mL OMNIPAQUE IOHEXOL 300 MG/ML  SOLN COMPARISON:  08/31/2019 FINDINGS: CT CHEST FINDINGS Cardiovascular: The heart is normal in size. No pericardial effusion. Mild atherosclerotic calcifications of the aortic arch. Mild coronary atherosclerosis of the LAD. Left chest port terminates in the mid SVC. Mediastinum/Nodes: No suspicious mediastinal lymphadenopathy. Status post right axillary lymph node dissection. Lungs/Pleura: Scattered peribronchovascular nodular opacities, upper lobe predominant with bronchial wall thickening and subpleural reticulation, unchanged. Dominant opacity measures 15 x 10 mm in the right upper lobe (series 6/images 30-31), previously 16 x 10 mm when measured in a similar fashion, unchanged. No new/suspicious pulmonary nodules. No focal consolidation. No pleural effusion or pneumothorax. Musculoskeletal: Status post right mastectomy with tram flap reconstruction. Mild degenerative changes of the thoracic spine. No  focal osseous lesions. CT ABDOMEN PELVIS FINDINGS Hepatobiliary: Liver is within normal limits. No suspicious/enhancing hepatic lesions. Gallbladder is unremarkable. No intrahepatic or extrahepatic ductal dilatation. Pancreas: Within normal limits. Spleen: Within normal limits. Adrenals/Urinary Tract: Adrenal glands are within normal limits. 6 mm anterior left lower pole renal cyst (series 7/image 19). Left kidney is within normal limits. No hydronephrosis. Bladder is within normal limits. Stomach/Bowel: Status post gastrectomy. No evidence of bowel obstruction. Normal appendix  (series 2/image 97). Vascular/Lymphatic: No evidence of abdominal aortic aneurysm. Atherosclerotic calcifications of the abdominal aorta and branch vessels. No suspicious abdominopelvic lymphadenopathy. Reproductive: Status post hysterectomy. No adnexal masses. Other: No abdominopelvic ascites. Postsurgical changes along the anterior abdominal wall. Musculoskeletal: Visualized osseous structures are within normal limits. No focal osseous lesions. IMPRESSION: Status post gastrectomy. Status post right mastectomy with right axillary lymph node dissection. Stable peribronchovascular nodular opacities, as described above, favored to be inflammatory. No findings specific for recurrent or metastatic disease. Electronically Signed   By: Julian Hy M.D.   On: 01/18/2020 12:47     ASSESSMENT & PLAN:  Mariah Kemp is a 59 y.o. female with    1. 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 Given her locally advanced disease, she completed 8 cycles ofneo-adjuvant chemoFLOT4. -She underwent total Gastrectomyand lymph node dissectionand had feeding tube placed on 10/02/18.She hadcomplete pathological response with allnodesnegative.  -Given herpCR, I do not recommend moreadjuvant radiation or chemotherapy. Will proceed with surveillance.  -I recommend she keep her PAC in place for at least a year given her risk of recurrence. Will continue flushes every4-8weeks. -Given recent symptoms of decreased liquid intake as she can only tolerate hot liquids, right upper back pain and occasional post prandial vomiting I recommended a CT CAP for further evaluation.  -I personally reviewed and discussed her CT CAP from 01/18/20 shows no evidence of cancer recurrence, or other concerning findings. -I encouraged her to follow-up with Dr. Loletha Carrow to see if she needs a repeated EGD  2.Weight loss s/p gastric surgery, Dehydration, Acid  reflux, Right upper back pain -Her weight has fluctuated lately, but mostly stable with small frequent meals.  -She notes pain with swallowing cold and room tempeture liquids after her surgery. This did improve but recently progressed again in the past month.  -Given post prandial vomiting based on how much she eats, Dr Barry Dienes referred her for esophageal stretching and to PT given right abdominal muscle tightness. -She notes recent right upper back pain mainly with eating.  -stable   3. Anemia,B12 deficiency S/p gastric surgery  -Given 3u blood transfusion on 1/15/20andIV Feraheme in 03/2018 and  2-05/2019. -Started monthly B12 injections in 01/2019.Switched to home injections after 09/03/19 -Anemia has been mild and stable lately.   4. H/o stage 3 right breast cancer in 2007, GeneticTesting was not pursued for now due to cost -Treated with right mastectomy, reconstruction with 3 LN removed. She underwent chemotherapy for 5 months, likely AC-T. She completed Tamoxifen for at least 5 years.   5. HTN andhypothyroidism -Continue to follow-up with PCP   PLAN: -CT scan reviewed, no evidence of recurrence. -Her B12 level was low on last visit, she forgot about injection for 2 weeks.  She has restarted, and I recommend her to change from weekly to every 2 weeks. -Lab and follow-up in 3 months, with port flush every 6 weeks  No problem-specific Assessment & Plan notes found for this encounter.   No orders  of the defined types were placed in this encounter.  I discussed the assessment and treatment plan with the patient. The patient was provided an opportunity to ask questions and all were answered. The patient agreed with the plan and demonstrated an understanding of the instructions.  The patient was advised to call back or seek an in-person evaluation if the symptoms worsen or if the condition fails to improve as anticipated.  The total time spent in the appointment was 20 minutes.     [name], MD 01/20/2020   I, Joslyn Devon, am acting as scribe for [name], MD.   I have reviewed the above documentation for accuracy and completeness, and I agree with the above.

## 2020-01-18 ENCOUNTER — Encounter (HOSPITAL_COMMUNITY): Payer: Self-pay

## 2020-01-18 ENCOUNTER — Other Ambulatory Visit: Payer: Self-pay

## 2020-01-18 ENCOUNTER — Ambulatory Visit (HOSPITAL_COMMUNITY)
Admission: RE | Admit: 2020-01-18 | Discharge: 2020-01-18 | Disposition: A | Discharge status: Home / Self Care | Payer: BC Managed Care – PPO | Source: Ambulatory Visit | Attending: Hematology | Admitting: Hematology

## 2020-01-18 DIAGNOSIS — C162 Malignant neoplasm of body of stomach: Secondary | ICD-10-CM | POA: Insufficient documentation

## 2020-01-18 DIAGNOSIS — N281 Cyst of kidney, acquired: Secondary | ICD-10-CM | POA: Diagnosis not present

## 2020-01-18 DIAGNOSIS — M47814 Spondylosis without myelopathy or radiculopathy, thoracic region: Secondary | ICD-10-CM | POA: Diagnosis not present

## 2020-01-18 DIAGNOSIS — C169 Malignant neoplasm of stomach, unspecified: Secondary | ICD-10-CM | POA: Diagnosis not present

## 2020-01-18 DIAGNOSIS — I700 Atherosclerosis of aorta: Secondary | ICD-10-CM | POA: Diagnosis not present

## 2020-01-18 DIAGNOSIS — I2510 Atherosclerotic heart disease of native coronary artery without angina pectoris: Secondary | ICD-10-CM | POA: Diagnosis not present

## 2020-01-18 MED ORDER — HEPARIN SOD (PORK) LOCK FLUSH 100 UNIT/ML IV SOLN
500.0000 [IU] | Freq: Once | INTRAVENOUS | Status: AC
  Administered 2020-01-18: 500 [IU] via INTRAVENOUS

## 2020-01-18 MED ORDER — IOHEXOL 9 MG/ML PO SOLN
ORAL | Status: AC
  Filled 2020-01-18: qty 1000

## 2020-01-18 MED ORDER — IOHEXOL 9 MG/ML PO SOLN
1000.0000 mL | ORAL | Status: AC
  Administered 2020-01-18: 1000 mL via ORAL

## 2020-01-18 MED ORDER — IOHEXOL 300 MG/ML  SOLN
100.0000 mL | Freq: Once | INTRAMUSCULAR | Status: AC | PRN
  Administered 2020-01-18: 100 mL via INTRAVENOUS

## 2020-01-18 MED ORDER — HEPARIN SOD (PORK) LOCK FLUSH 100 UNIT/ML IV SOLN
INTRAVENOUS | Status: AC
  Filled 2020-01-18: qty 5

## 2020-01-19 ENCOUNTER — Telehealth: Payer: Self-pay | Admitting: Hematology

## 2020-01-20 ENCOUNTER — Encounter: Payer: Self-pay | Admitting: Hematology

## 2020-01-20 ENCOUNTER — Ambulatory Visit: Payer: BC Managed Care – PPO

## 2020-01-20 ENCOUNTER — Inpatient Hospital Stay: Payer: BC Managed Care – PPO | Attending: Hematology | Admitting: Hematology

## 2020-01-20 DIAGNOSIS — C162 Malignant neoplasm of body of stomach: Secondary | ICD-10-CM

## 2020-01-20 DIAGNOSIS — I10 Essential (primary) hypertension: Secondary | ICD-10-CM | POA: Diagnosis not present

## 2020-01-20 DIAGNOSIS — E538 Deficiency of other specified B group vitamins: Secondary | ICD-10-CM | POA: Diagnosis not present

## 2020-01-21 ENCOUNTER — Telehealth: Payer: Self-pay | Admitting: Hematology

## 2020-01-21 NOTE — Telephone Encounter (Signed)
Scheduled per 11/4 los. Pt is aware of appt times and dates.

## 2020-01-26 ENCOUNTER — Ambulatory Visit (AMBULATORY_SURGERY_CENTER): Payer: BC Managed Care – PPO | Admitting: Gastroenterology

## 2020-01-26 ENCOUNTER — Other Ambulatory Visit: Payer: Self-pay

## 2020-01-26 ENCOUNTER — Encounter: Payer: Self-pay | Admitting: Gastroenterology

## 2020-01-26 VITALS — BP 143/67 | HR 60 | Temp 98.0°F | Resp 10 | Ht 61.0 in | Wt 118.8 lb

## 2020-01-26 DIAGNOSIS — R1310 Dysphagia, unspecified: Secondary | ICD-10-CM | POA: Diagnosis not present

## 2020-01-26 MED ORDER — SODIUM CHLORIDE 0.9 % IV SOLN: 500.0000 mL | Freq: Once | INTRAVENOUS | Status: DC

## 2020-01-26 NOTE — Progress Notes (Signed)
Pt's states no medical or surgical changes since previsit or office visit.   Vs by CW in adm 

## 2020-01-26 NOTE — Patient Instructions (Signed)
Resume previous diet Continue current medications  YOU HAD AN ENDOSCOPIC PROCEDURE TODAY AT [facility]:   Refer to the procedure report that was given to you for any specific questions about what was found during the examination.  If the procedure report does not answer your questions, please call your gastroenterologist to clarify.  If you requested that your care partner not be given the details of your procedure findings, then the procedure report has been included in a sealed envelope for you to review at your convenience later.  YOU SHOULD EXPECT: Some feelings of bloating in the abdomen. Passage of more gas than usual.  Walking can help get rid of the air that was put into your GI tract during the procedure and reduce the bloating. If you had a lower endoscopy (such as a colonoscopy or flexible sigmoidoscopy) you may notice spotting of blood in your stool or on the toilet paper. If you underwent a bowel prep for your procedure, you may not have a normal bowel movement for a few days.  Please Note:  You might notice some irritation and congestion in your nose or some drainage.  This is from the oxygen used during your procedure.  There is no need for concern and it should clear up in a day or so.  SYMPTOMS TO REPORT IMMEDIATELY:  YOU HAD AN ENDOSCOPIC PROCEDURE TODAY AT [facility]:   Refer to the procedure report that was given to you for any specific questions about what was found during the examination.  If the procedure report does not answer your questions, please call your gastroenterologist to clarify.  If you requested that your care partner not be given the details of your procedure findings, then the procedure report has been included in a sealed envelope for you to review at your convenience later.  YOU SHOULD EXPECT: Some feelings of bloating in the abdomen. Passage of more gas than usual.  Walking can help get rid of the air that was put into your GI  tract during the procedure and reduce the bloating. If you had a lower endoscopy (such as a colonoscopy or flexible sigmoidoscopy) you may notice spotting of blood in your stool or on the toilet paper. If you underwent a bowel prep for your procedure, you may not have a normal bowel movement for a few days.  Please Note:  You might notice some irritation and congestion in your nose or some drainage.  This is from the oxygen used during your procedure.  There is no need for concern and it should clear up in a day or so.  SYMPTOMS TO REPORT IMMEDIATELY:   Following upper endoscopy (EGD)  Vomiting of blood or coffee ground material  New chest pain or pain under the shoulder blades  Painful or persistently difficult swallowing  New shortness of breath  Fever of 100F or higher  Black, tarry-looking stools  For urgent or emergent issues, a gastroenterologist can be reached at any hour by calling [phone]. Do not use MyChart messaging for urgent concerns.    DIET:  We do recommend a small meal at first, but then you may proceed to your regular diet.  Drink plenty of fluids but you should avoid alcoholic beverages for 24 hours.  ACTIVITY:  You should plan to take it easy for the rest of today and you should NOT DRIVE or use heavy machinery until tomorrow (because of the sedation medicines used during the test).  FOLLOW UP: Our staff will call the number listed on your records 48-72 hours following your procedure to check on you and address any questions or concerns that you may have regarding the information given to you following your procedure. If we do not reach you, we will leave a message.  We will attempt to reach you two times.  During this call, we will ask if you have developed any symptoms of COVID 19. If you develop any symptoms (ie: fever, flu-like symptoms, shortness of breath, cough etc.) before then, please call [phone].  If you test positive for Covid 19 in the 2 weeks  post procedure, please call and report this information to [place].    If any biopsies were taken you will be contacted by phone or by letter within the next 1-3 weeks.  Please call us at [phone] if you have not heard about the biopsies in 3 weeks.    SIGNATURES/CONFIDENTIALITY: You and/or your care partner have signed paperwork which will be entered into your electronic medical record.  These signatures attest to the fact that that the information above on your After Visit Summary has been reviewed and is understood.  Full responsibility of the confidentiality of this discharge information lies with you and/or your care-partner. For urgent or emergent issues, a gastroenterologist can be reached at any hour by calling [phone]. Do not use MyChart messaging for urgent concerns.   DIET:  We do recommend a small meal at first, but then you may proceed to your regular diet.  Drink plenty of fluids but you should avoid alcoholic beverages for 24 hours.  ACTIVITY:  You should plan to take it easy for the rest of today and you should NOT DRIVE or use heavy machinery until tomorrow (because of the sedation medicines used during the test).    FOLLOW UP: Our staff will call the number listed on your records 48-72 hours following your procedure to check on you and address any questions or concerns that you may have regarding the information given to you following your procedure. If we do not reach you, we will leave a message.  We will attempt to reach you two times.  During this call, we will ask if you have developed any symptoms of COVID 19. If you develop any symptoms (ie: fever, flu-like symptoms, shortness of breath, cough etc.) before then, please call [phone].  If you test positive for Covid 19 in the 2 weeks post procedure, please call and report this information to [place].    If any biopsies were taken you will be contacted by phone or by letter within the next 1-3 weeks.  Please call us at  [phone] if you have not heard about the biopsies in 3 weeks.   SIGNATURES/CONFIDENTIALITY: You and/or your care partner have signed paperwork which will be entered into your electronic medical record.  These signatures attest to the fact that that the information above on your After Visit Summary has been reviewed and is understood.  Full responsibility of the confidentiality of this discharge information lies with you and/or your care-partner.

## 2020-01-26 NOTE — Progress Notes (Signed)
PT taken to PACU. Monitors in place. VSS. Report given to RN. 

## 2020-01-26 NOTE — Op Note (Signed)
[facility] Patient Name: Mariah Kemp Procedure Date: 01/26/2020 1:29 PM MRN: [id number] Endoscopist: Mallie Mussel L. Loletha Carrow , MD Age: 59 Referring MD:  Date of Birth: Apr 06, 1960 Gender: Female Account #: [id number] Procedure:                Upper GI endoscopy Indications:              Esophageal dysphagia (s/p gastrectomy for cancer,                            esophago-jejunostomy) sensation of food, and                            sometime liquid,stuck in lower chest/epigastrium.                            UGIS done last year Medicines:                Monitored Anesthesia Care Procedure:                Pre-Anesthesia Assessment:                           - Prior to the procedure, a History and Physical                            was performed, and patient medications and                            allergies were reviewed. The patient's tolerance of                            previous anesthesia was also reviewed. The risks                            and benefits of the procedure and the sedation                            options and risks were discussed with the patient.                            All questions were answered, and informed consent                            was obtained. Prior Anticoagulants: The patient has                            taken no previous anticoagulant or antiplatelet                            agents. ASA Grade Assessment: III - A patient with                            severe systemic disease. After reviewing the risks  and benefits, the patient was deemed in                            satisfactory condition to undergo the procedure.                           After obtaining informed consent, the endoscope was                            passed under direct vision. Throughout the                            procedure, the patient's blood pressure, pulse, and                            oxygen saturations were monitored  continuously. The                            Endoscope was introduced through the mouth, and                            advanced to the efferent jejunal loop. The upper GI                            endoscopy was accomplished without difficulty. The                            patient tolerated the procedure well. Scope In: Scope Out: Findings:                 The esophagus was normal.                           Total gastrectomy with G-J anastomosis. Short blind                            end and then efferent loop (deeply intubated). The                            anastomosis appears healthy and without stricture                            or inflammation.                           The examined jejunum was normal. Complications:            No immediate complications. Estimated Blood Loss:     Estimated blood loss: none. Impression:               - Normal esophagus. s/p gastrectomy                           - Normal examined jejunum.                           - No specimens  collected.                           There is no stricture at the anastomosis or in the                            small bowel. Symptoms occuring because of the                            normal shape of the anastomosis, with the effert                            limb not directly in line with the esophageal lumen. Recommendation:           - Patient has a contact number available for                            emergencies. The signs and symptoms of potential                            delayed complications were discussed with the                            patient. Return to normal activities tomorrow.                            Written discharge instructions were provided to the                            patient.                           - Resume previous diet. Patient has accommodated                            with portion size, food consistency and slow eating.                           - Continue present  medications. Rochanda Harpham L. Loletha Carrow, MD 01/26/2020 2:06:21 PM This report has been signed electronically. CC Letter to:             [name], MD

## 2020-01-28 ENCOUNTER — Telehealth: Payer: Self-pay

## 2020-01-28 NOTE — Telephone Encounter (Signed)
  Follow up Call-  Call back number 01/26/2020  Post procedure Call Back phone  # [phone]  Permission to leave phone message Yes  Some recent data might be hidden     Patient questions:  Do you have a fever, pain , or abdominal swelling? No. Pain Score  0 *  Have you tolerated food without any problems? Yes.    Have you been able to return to your normal activities? Yes.    Do you have any questions about your discharge instructions: Diet   No. Medications  No. Follow up visit  No.  Do you have questions or concerns about your Care? No.  Actions: * If pain score is 4 or above: No action needed, pain <4.  1. Have you developed a fever since your procedure? no  2.   Have you had an respiratory symptoms (SOB or cough) since your procedure? no  3.   Have you tested positive for COVID 19 since your procedure no  4.   Have you had any family members/close contacts diagnosed with the COVID 19 since your procedure?  no   If yes to any of these questions please route to Joylene John, RN and Joella Prince, RN

## 2020-01-31 ENCOUNTER — Other Ambulatory Visit: Payer: Self-pay

## 2020-01-31 DIAGNOSIS — E538 Deficiency of other specified B group vitamins: Secondary | ICD-10-CM

## 2020-01-31 MED ORDER — CYANOCOBALAMIN 1000 MCG/ML IJ SOLN: 1000.0000 ug | INTRAMUSCULAR | 6 refills | Status: DC

## 2020-02-23 ENCOUNTER — Ambulatory Visit: Payer: BC Managed Care – PPO | Admitting: Physical Therapy

## 2020-03-02 ENCOUNTER — Inpatient Hospital Stay: Payer: BC Managed Care – PPO | Attending: Hematology

## 2020-03-02 ENCOUNTER — Other Ambulatory Visit: Payer: Self-pay

## 2020-03-02 ENCOUNTER — Ambulatory Visit: Payer: BC Managed Care – PPO | Admitting: Physical Therapy

## 2020-03-02 DIAGNOSIS — D500 Iron deficiency anemia secondary to blood loss (chronic): Secondary | ICD-10-CM

## 2020-03-02 DIAGNOSIS — Z95828 Presence of other vascular implants and grafts: Secondary | ICD-10-CM

## 2020-03-02 DIAGNOSIS — C162 Malignant neoplasm of body of stomach: Secondary | ICD-10-CM | POA: Diagnosis not present

## 2020-03-02 DIAGNOSIS — Z452 Encounter for adjustment and management of vascular access device: Secondary | ICD-10-CM | POA: Insufficient documentation

## 2020-03-02 MED ORDER — HEPARIN SOD (PORK) LOCK FLUSH 100 UNIT/ML IV SOLN
500.0000 [IU] | Freq: Once | INTRAVENOUS | Status: AC
  Administered 2020-03-02: 09:00:00 500 [IU]
  Filled 2020-03-02: qty 5

## 2020-03-02 MED ORDER — SODIUM CHLORIDE 0.9% FLUSH
10.0000 mL | Freq: Once | INTRAVENOUS | Status: AC
  Administered 2020-03-02: 09:00:00 10 mL
  Filled 2020-03-02: qty 10

## 2020-03-02 NOTE — Patient Instructions (Signed)

## 2020-04-19 NOTE — Progress Notes (Signed)
[place]   [phone] [phone]) [phone]   Clinic Follow up Note   Patient Care Team: Emeterio Reeve, DO as PCP - General (Osteopathic Medicine) [name], MD as Consulting Physician (General Surgery) [name], MD as Consulting Physician (Hematology) Alla Feeling, NP as Nurse Practitioner (Nurse Practitioner)  Date of Service:  04/21/2020  CHIEF COMPLAINT: F/u of Gastric Cancer  SUMMARY OF ONCOLOGIC HISTORY: Oncology History Overview Note  Cancer Staging Gastric cancer [facility]) Staging form: Stomach, AJCC 8th Edition - Clinical stage from 03/29/2018: Stage IVA (cT4b, cN1, cM0) - Signed by [name], MD on 04/03/2018 - Pathologic stage from 10/02/2018: ypT0, pN0, cM0 - Signed by Alla Feeling, NP on 01/19/2019     Gastric cancer ([place])  03/19/2018 Procedure   Upper Endoscopy by Dr. Michail Sermon 03/29/18  IMPRESSION - Normal esophagus. - Z-line regular, 38 cm from the incisors. - Non-obstructing oozing gastric ulcer with pigmented material. Biopsied. - Normal examined duodenum. - Acute gastritis.   03/29/2018 Procedure   Colonoscopy by Dr. Michail Sermon 03/29/18 IMPRESSION - Preparation of the colon was fair. - Internal hemorrhoids. - The examined portion of the ileum was normal. - No specimens collected.   03/29/2018 Initial Biopsy   Diagnosis 03/29/18 Stomach, biopsy, Proximal - ADENOCARCINOMA. - GOBLET CELL METAPLASIA. - SEE COMMENT. Microscopic Comment A Warthin Starry stain is negative for the presence of Helicobacter pylori organisms. Dr Vic Ripper has reviewed the case and concurs with this interpretation. Dr Michail Sermon was paged on 03/31/2018. (JBK:ecj 03/31/2018)   03/29/2018 Cancer Staging   Staging form: Stomach, AJCC 8th Edition - Clinical stage from 03/29/2018: Stage IVA (cT4b, cN1, cM0) - Signed by [name], MD on 04/03/2018   04/02/2018 Initial Diagnosis   Gastric cancer ([place])   04/02/2018 Imaging   CT CAP W Contrast  04/02/18 IMPRESSION: 1. Focal thickening lateral wall proximal stomach with protrusion of low-attenuation soft tissue beyond the expected confines of the gastric wall into the splenic hilum. Imaging features highly concerning for transmural tumor extension into the splenic hilum. Several small nodules in this region are likely lymph nodes, concerning for metastatic disease. 2. Scattered bilateral pulmonary nodules measuring up to 5 mm. Nonspecific, but close attention on follow-up recommended as metastatic disease not excluded. 3. Small lymph nodes in the gastrohepatic ligament, but there is no gastrohepatic or hepato duodenal ligament lymphadenopathy. No evidence for liver metastases. 4.  Aortic Atherosclerois (ICD10-170.0   04/15/2018 - 07/22/2018 Chemotherapy   FLOT4 every 2 weeks starting 04/15/18. She completed 8 cycles on 07/22/18.   08/21/2018 Imaging   CT CAP W Contrast  IMPRESSION: 1. No evidence of residual gastric mass. No abdominopelvic adenopathy. 2. Similar bilateral pulmonary nodules, indeterminate. 3. Development of upper lung and peribronchovascular predominant pulmonary opacities. Differential considerations include drug toxicity or atypical infection. Lymphangitic tumor spread felt less likely but cannot be excluded. Consider chest CT follow-up at 3-6 months. 4.  Aortic Atherosclerosis (ICD10-I70.0).    10/02/2018 Surgery   LAPAROSCOPY DIAGNOSTIC, TOTAL GASTRECTOMY, FEEDING TUBE PLACEMENT by Dr. 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 - ADENOCARCINOMA IS NOT IDENTIFIED. - THERE IS NO EVIDENCE OF CARCINOMA IN 34 OF 34 LYMPH NODES (0/34). - SEE ONCOLOGY TABLE BELOW.   10/02/2018 Cancer Staging   Staging form: Stomach, AJCC 8th Edition - Pathologic stage from 10/02/2018: ypT0, pN0, cM0 - Signed by Alla Feeling, NP on 01/19/2019  01/20/2019 Survivorship   Per Cira Rue, NP    03/10/2019 Imaging   CT CAP W contrast IMPRESSION: 1. Interval Roux-en-Y esophagojejunostomy with no the definite findings of metastatic disease in the chest, abdomen, or pelvis. 2. Multiple irregular bilateral pulmonary nodules are smaller than on the previous study. This may be infectious/inflammatory etiology but continued close attention on follow-up recommended. 3. Central mesenteric lymph nodes measure upper normal for size and are slightly increased since the preoperative exam. Attention on follow-up recommended. 4.  Aortic Atherosclerois (ICD10-170.0)   08/31/2019 Imaging   CT CAP w contrast  IMPRESSION: 1. Stable exam. No new or progressive findings in the chest, abdomen, or pelvis. 2. Numerous bilateral irregular nodular opacities in both lungs are stable in the interval. No new suspicious pulmonary nodule or mass. 3. Stable borderline enlarged portocaval lymph node. 4. Aortic Atherosclerosis (ICD10-I70.0).      CURRENT THERAPY:  Gastric Cancer Surveillance B12 injection q3weeks at home.    INTERVAL HISTORY:  Mariah Kemp is here for a follow up of gastric cancer. She presents to the clinic alone. She notes she is doing well and stable. 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 She notes she continues to manage her diet. She has been able to maintain her weight lately. She notes she cannot keep up with Ensure but does snack through the day. She notes she get gas easy from Acid reflux. She uses tums or Pepto bismol after eating each meal. She denies any new pain. She notes regular BM, but will have diarrhea based on what she eats. She continues B12 injection at home every 3 weeks.     REVIEW OF SYSTEMS:   Constitutional: Denies fevers, chills or abnormal weight loss Eyes: Denies blurriness of vision Ears, nose, mouth, throat, and face: Denies mucositis or sore throat Respiratory: Denies cough, dyspnea or  wheezes Cardiovascular: Denies palpitation, chest discomfort or lower extremity swelling Gastrointestinal:  Denies nausea or change in bowel habits (+) Acid reflux (+) Gas Skin: Denies abnormal skin rashes Lymphatics: Denies new lymphadenopathy or easy bruising Neurological:Denies numbness, tingling or new weaknesses Behavioral/Psych: Mood is stable, no new changes  All other systems were reviewed with the patient and are negative.  MEDICAL HISTORY:  Past Medical History:  Diagnosis Date  . Anemia   . Asthma   . Blood transfusion without reported diagnosis   . Breast cancer ([place])   . Dyspnea    over exertion  . Hypertension   . Hypothyroidism   . Stomach cancer [facility])     SURGICAL HISTORY: Past Surgical History:  Procedure Laterality Date  . ABDOMINAL HYSTERECTOMY  1996  . BARIATRIC SURGERY     stomach removal  . BILATERAL SALPINGOOPHORECTOMY  2007  . BIOPSY  03/29/2018   Procedure: BIOPSY;  Surgeon: Wilford Corner, MD;  Location: [place];  Service: Endoscopy;;  . CARPAL TUNNEL RELEASE    . COLONOSCOPY N/A 03/29/2018   Procedure: COLONOSCOPY;  Surgeon: Wilford Corner, MD;  Location: [facility] ENDOSCOPY;  Service: Endoscopy;  Laterality: N/A;  . COLONOSCOPY    . ESOPHAGOGASTRODUODENOSCOPY N/A 03/29/2018   Procedure: ESOPHAGOGASTRODUODENOSCOPY (EGD);  Surgeon: Wilford Corner, MD;  Location: [place];  Service: Endoscopy;  Laterality: N/A;  . GASTRECTOMY N/A 10/02/2018   Procedure: TOTAL GASTRECTOMY;  Surgeon: [name], MD;  Location: [place];  Service: General;  Laterality: N/A;  . GASTROSTOMY N/A 10/02/2018   Procedure: FEEDING TUBE PLACEMENT;  Surgeon: [name], MD;  Location: [place];  Service: General;  Laterality: N/A;  . LAPAROSCOPY N/A 04/13/2018   Procedure: LAPAROSCOPY DIAGNOSTIC;  Surgeon: [name], MD;  Location: [facility];  Service: General;  Laterality: N/A;  . LAPAROSCOPY N/A 10/02/2018   Procedure: LAPAROSCOPY DIAGNOSTIC;  Surgeon: [name],  MD;  Location: [place];  Service: General;  Laterality: N/A;  . MASTECTOMY  2007  . PORTACATH PLACEMENT Left 04/13/2018   Procedure: INSERTION PORT-A-CATH ERAS PATHWAY;  Surgeon: [name], MD;  Location: [facility];  Service: General;  Laterality: Left;  subclavian  . UPPER GASTROINTESTINAL ENDOSCOPY      I have reviewed the social history and family history with the patient and they are unchanged from previous note.  ALLERGIES:  has No Known Allergies.  MEDICATIONS:  Current Outpatient Medications  Medication Sig Dispense Refill  . acyclovir (ZOVIRAX) 400 MG tablet Take 1 tablet (400 mg total) by mouth 3 (three) times daily. For five days. As needed for cold sore. (Patient taking differently: Take 400 mg by mouth 3 (three) times daily as needed (cold sores.). ) 15 tablet 0  . albuterol (VENTOLIN HFA) 108 (90 Base) MCG/ACT inhaler Inhale 1-2 puffs into the lungs every 4 (four) hours as needed for wheezing or shortness of breath. 18 g 11  . baclofen (LIORESAL) 10 MG tablet Take 1 tablet (10 mg total) by mouth 4 (four) times daily as needed for muscle spasms. (Patient not taking: Reported on 01/13/2020) 30 each 0  . calcium carbonate (TUMS - DOSED IN MG ELEMENTAL CALCIUM) 500 MG chewable tablet Chew 1 tablet (200 mg of elemental calcium total) by mouth 3 (three) times daily. 90 tablet 11  . cyanocobalamin (,VITAMIN B-12,) 1000 MCG/ML injection Inject 1 mL (1,000 mcg total) into the muscle once a week. 4 mL 6  . fluticasone-salmeterol (ADVAIR HFA) 115-21 MCG/ACT inhaler Inhale 2 puffs into the lungs 2 (two) times daily. 3 Inhaler 99  . levothyroxine (SYNTHROID) 200 MCG tablet Take 1 tablet (200 mcg total) by mouth daily before breakfast. Need labs prior to further refills! 60 tablet 0  . lidocaine-prilocaine (EMLA) cream Apply 1 application topically daily as needed (prior to port being accessed). 30 g 1  . loperamide (IMODIUM) 2 MG capsule Take 1 capsule (2 mg total) by mouth 2 (two) times daily as  needed for diarrhea or loose stools. 30 capsule 0  . Needles & Syringes MISC 25 ga x 1 " IM needle/syringe 6 each prn   No current facility-administered medications for this visit.    PHYSICAL EXAMINATION: ECOG PERFORMANCE STATUS: 1 - Symptomatic but completely ambulatory  Vitals:   04/21/20 0928  BP: 104/67  Pulse: 82  Resp: 16  Temp: 98.6 F (37 C)  SpO2: 100%   Filed Weights   04/21/20 0928  Weight: 119 lb 3.2 oz (54.1 kg)    GENERAL:alert, no distress and comfortable SKIN: skin color, texture, turgor are normal, no rashes or significant lesions EYES: normal, Conjunctiva are pink and non-injected, sclera clear  NECK: supple, thyroid normal size, non-tender, without nodularity LYMPH:  no palpable lymphadenopathy in the cervical, axillary  LUNGS: clear to auscultation and percussion with normal breathing effort HEART: regular rate & rhythm and no murmurs and no lower extremity edema ABDOMEN:abdomen soft, non-tender and normal bowel sounds (+) Midline surgical incision with scar tissue. (+) Minimal tenderness around surgical incision.  Musculoskeletal:no cyanosis of digits and no clubbing  NEURO: alert & oriented x 3 with fluent speech, no focal motor/sensory deficits  LABORATORY DATA:  I have reviewed the  data as listed CBC Latest Ref Rng & Units 04/21/2020 01/07/2020 08/31/2019  WBC 4.0 - 10.5 K/uL 4.3 5.3 5.9  Hemoglobin 12.0 - 15.0 g/dL 10.8(L) 10.6(L) 11.7(L)  Hematocrit 36.0 - 46.0 % 32.0(L) 30.3(L) 32.7(L)  Platelets 150 - 400 K/uL 162 151 158     CMP Latest Ref Rng & Units 04/21/2020 01/07/2020 08/31/2019  Glucose 70 - 99 mg/dL 81 73 85  BUN 6 - 20 mg/dL 17 11 9   Creatinine 0.44 - 1.00 mg/dL 0.61 0.51 0.57  Sodium 135 - 145 mmol/L 143 140 139  Potassium 3.5 - 5.1 mmol/L 3.9 3.8 3.9  Chloride 98 - 111 mmol/L 107 107 104  CO2 22 - 32 mmol/L 25 27 25   Calcium 8.9 - 10.3 mg/dL 9.3 9.2 9.6  Total Protein 6.5 - 8.1 g/dL 7.0 6.6 7.3  Total Bilirubin 0.3 - 1.2 mg/dL  0.3 0.2(L) 0.4  Alkaline Phos 38 - 126 U/L 77 78 78  AST 15 - 41 U/L 19 18 22   ALT 0 - 44 U/L 14 14 24       RADIOGRAPHIC STUDIES: I have personally reviewed the radiological images as listed and agreed with the findings in the report. No results found.   ASSESSMENT & PLAN:  Mariah Kemp is a 60 y.o. female with   1. 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 Given her locally advanced disease, she completed 8 cycles ofneo-adjuvant chemoFLOT4. -She underwent total Gastrectomyand lymph node dissectionand had feeding tube placed on 10/02/18.She hadcomplete pathological response with allnodesnegative.  -Given herpCR, I do not recommend moreadjuvant radiation or chemotherapy. She is currently on surveillance.  -I recommend she keep her PAC in place for at least a year given her risk of recurrence. Will continue flushes every4-8weeks. -Her CT CAP from 01/18/20 shows no evidence of cancer recurrence, or other concerning findings. Her 01/26/20 Upper endoscopy was unremarkable  -She is clinically doing well and stable. She is recently able to maintain weight and working to [employer]. Physical exam benign. Labs reviewed, CBC and CMP WNL except stable anemia.  -Will continue surveillance. Next CT CAP in 07/2020 -F/u in 3 months with next CT CAP results.    2.Weight losss/p gastric surgery, Acid reflux -After months of major weight loss then fluctuating weight, her weight has been stable recently. Will continue to manage diet closely -She has Acid reflux and gas s/p gastric surgery. She will continue Tums as needed.   3. Anemia,B12 deficiency S/p gastric surgery -Given 3u blood transfusion on 1/15/20andIVFeraheme in1/2020 and2-05/2019. -Started monthly B12 injections in 01/2019.Switched to home injections after 09/03/19, now every 3 weeks.  -Anemia mild and stable.   4. H/o stage 3 right  breast cancer in 2007,GeneticTesting was not pursued for now due to cost -Treated with right mastectomy, reconstruction with 3 LN removed. She underwent chemotherapy for 5 months, likely AC-T. She completed Tamoxifen for at least 5 years.   5. HTN, hypothyroidism, Health maintenance  -Continue to follow-up with PCP -Her 03/2018 Colonoscopy was overall normal and benign. Recommended to repeat 10 years later.  -She is overdue for mammogram. I recommend she continue yearly. I will order to be done in February or March 2022. She is agreeable.    PLAN: -Mammogram in February or March 2022 in [place]  -Lab and [place] flush in 6 weeks  -F/u in 3 months with lab, flush and CT CAP w contrast a few days before    No  problem-specific Assessment & Plan notes found for this encounter.   Orders Placed This Encounter  Procedures  . MM Digital Screening    Standing Status:   Future    Standing Expiration Date:   04/21/2021    Order Specific Question:   Reason for Exam (SYMPTOM  OR DIAGNOSIS REQUIRED)    Answer:   screening    Order Specific Question:   Is the patient pregnant?    Answer:   No    Order Specific Question:   Preferred imaging location?    Answer:   [facility]  . CT CHEST ABDOMEN PELVIS W CONTRAST    Standing Status:   Future    Standing Expiration Date:   04/21/2021    Order Specific Question:   If indicated for the ordered procedure, I authorize the administration of contrast media per Radiology protocol    Answer:   Yes    Order Specific Question:   Is patient pregnant?    Answer:   No    Order Specific Question:   Preferred imaging location?    Answer:   [facility]    Order Specific Question:   Release to patient    Answer:   Immediate    Order Specific Question:   Is Oral Contrast requested for this exam?    Answer:   Yes, Per Radiology protocol    Order Specific Question:   Reason for Exam (SYMPTOM  OR DIAGNOSIS REQUIRED)    Answer:   rule out  recurrence   All questions were answered. The patient knows to call the clinic with any problems, questions or concerns. No barriers to learning was detected. The total time spent in the appointment was 30 minutes.     [name], MD 04/21/2020   I, Joslyn Devon, am acting as scribe for [name], MD.   I have reviewed the above documentation for accuracy and completeness, and I agree with the above.

## 2020-04-21 ENCOUNTER — Inpatient Hospital Stay: Payer: BC Managed Care – PPO

## 2020-04-21 ENCOUNTER — Other Ambulatory Visit: Payer: Self-pay

## 2020-04-21 ENCOUNTER — Telehealth: Payer: Self-pay | Admitting: Hematology

## 2020-04-21 ENCOUNTER — Inpatient Hospital Stay: Payer: BC Managed Care – PPO | Attending: Hematology | Admitting: Hematology

## 2020-04-21 ENCOUNTER — Encounter: Payer: Self-pay | Admitting: Hematology

## 2020-04-21 VITALS — BP 104/67 | HR 82 | Temp 98.6°F | Resp 16 | Ht 61.0 in | Wt 119.2 lb

## 2020-04-21 DIAGNOSIS — I10 Essential (primary) hypertension: Secondary | ICD-10-CM | POA: Diagnosis not present

## 2020-04-21 DIAGNOSIS — E538 Deficiency of other specified B group vitamins: Secondary | ICD-10-CM | POA: Insufficient documentation

## 2020-04-21 DIAGNOSIS — D500 Iron deficiency anemia secondary to blood loss (chronic): Secondary | ICD-10-CM

## 2020-04-21 DIAGNOSIS — D649 Anemia, unspecified: Secondary | ICD-10-CM | POA: Diagnosis not present

## 2020-04-21 DIAGNOSIS — Z95828 Presence of other vascular implants and grafts: Secondary | ICD-10-CM

## 2020-04-21 DIAGNOSIS — Z9221 Personal history of antineoplastic chemotherapy: Secondary | ICD-10-CM | POA: Diagnosis not present

## 2020-04-21 DIAGNOSIS — K219 Gastro-esophageal reflux disease without esophagitis: Secondary | ICD-10-CM | POA: Diagnosis not present

## 2020-04-21 DIAGNOSIS — E039 Hypothyroidism, unspecified: Secondary | ICD-10-CM | POA: Insufficient documentation

## 2020-04-21 DIAGNOSIS — C162 Malignant neoplasm of body of stomach: Secondary | ICD-10-CM | POA: Insufficient documentation

## 2020-04-21 DIAGNOSIS — Z853 Personal history of malignant neoplasm of breast: Secondary | ICD-10-CM | POA: Diagnosis not present

## 2020-04-21 DIAGNOSIS — C169 Malignant neoplasm of stomach, unspecified: Secondary | ICD-10-CM

## 2020-04-21 DIAGNOSIS — Z1231 Encounter for screening mammogram for malignant neoplasm of breast: Secondary | ICD-10-CM | POA: Diagnosis not present

## 2020-04-21 LAB — CMP (CANCER CENTER ONLY)
ALT: 14 U/L (ref 0–44)
AST: 19 U/L (ref 15–41)
Albumin: 3.3 g/dL — ABNORMAL LOW (ref 3.5–5.0)
Alkaline Phosphatase: 77 U/L (ref 38–126)
Anion gap: 11 (ref 5–15)
BUN: 17 mg/dL (ref 6–20)
CO2: 25 mmol/L (ref 22–32)
Calcium: 9.3 mg/dL (ref 8.9–10.3)
Chloride: 107 mmol/L (ref 98–111)
Creatinine: 0.61 mg/dL (ref 0.44–1.00)
GFR, Estimated: >60 mL/min (ref >60)
Glucose, Bld: 81 mg/dL (ref 70–99)
Potassium: 3.9 mmol/L (ref 3.5–5.1)
Sodium: 143 mmol/L (ref 135–145)
Total Bilirubin: 0.3 mg/dL (ref 0.3–1.2)
Total Protein: 7 g/dL (ref 6.5–8.1)

## 2020-04-21 LAB — VITAMIN B12: Vitamin B-12: 442 pg/mL (ref 180–914)

## 2020-04-21 LAB — CBC WITH DIFFERENTIAL (CANCER CENTER ONLY)
Abs Immature Granulocytes: 0.01 10*3/uL (ref 0.00–0.07)
Basophils Absolute: 0 10*3/uL (ref 0.0–0.1)
Basophils Relative: 1 %
Eosinophils Absolute: 0.3 10*3/uL (ref 0.0–0.5)
Eosinophils Relative: 8 %
HCT: 32 % — ABNORMAL LOW (ref 36.0–46.0)
Hemoglobin: 10.8 g/dL — ABNORMAL LOW (ref 12.0–15.0)
Immature Granulocytes: 0 %
Lymphocytes Relative: 25 %
Lymphs Abs: 1.1 10*3/uL (ref 0.7–4.0)
MCH: 31.7 pg (ref 26.0–34.0)
MCHC: 33.8 g/dL (ref 30.0–36.0)
MCV: 93.8 fL (ref 80.0–100.0)
Monocytes Absolute: 0.6 10*3/uL (ref 0.1–1.0)
Monocytes Relative: 13 %
Neutro Abs: 2.3 10*3/uL (ref 1.7–7.7)
Neutrophils Relative %: 53 %
Platelet Count: 162 10*3/uL (ref 150–400)
RBC: 3.41 MIL/uL — ABNORMAL LOW (ref 3.87–5.11)
RDW: 12.2 % (ref 11.5–15.5)
WBC Count: 4.3 10*3/uL (ref 4.0–10.5)
nRBC: 0 % (ref 0.0–0.2)

## 2020-04-21 LAB — FERRITIN: Ferritin: 937 ng/mL — ABNORMAL HIGH (ref 11–307)

## 2020-04-21 MED ORDER — SODIUM CHLORIDE 0.9% FLUSH
10.0000 mL | Freq: Once | INTRAVENOUS | Status: AC
  Administered 2020-04-21: 10 mL
  Filled 2020-04-21: qty 10

## 2020-04-21 MED ORDER — HEPARIN SOD (PORK) LOCK FLUSH 100 UNIT/ML IV SOLN
500.0000 [IU] | Freq: Once | INTRAVENOUS | Status: AC
  Administered 2020-04-21: 500 [IU]
  Filled 2020-04-21: qty 5

## 2020-04-21 NOTE — Telephone Encounter (Signed)
Scheduled appointments per 2/4 los. Spoke to patient who is aware of appointments dates and times.  

## 2020-04-22 LAB — CANCER ANTIGEN 19-9: CA 19-9: 6 U/mL (ref 0–35)

## 2020-04-24 ENCOUNTER — Encounter: Payer: Self-pay | Admitting: Hematology

## 2020-04-24 DIAGNOSIS — Z20822 Contact with and (suspected) exposure to covid-19: Secondary | ICD-10-CM | POA: Diagnosis not present

## 2020-04-24 DIAGNOSIS — R509 Fever, unspecified: Secondary | ICD-10-CM | POA: Diagnosis not present

## 2020-04-24 DIAGNOSIS — J029 Acute pharyngitis, unspecified: Secondary | ICD-10-CM | POA: Diagnosis not present

## 2020-05-25 ENCOUNTER — Ambulatory Visit: Payer: BC Managed Care – PPO

## 2020-05-31 ENCOUNTER — Encounter: Payer: Self-pay | Admitting: Osteopathic Medicine

## 2020-06-01 ENCOUNTER — Other Ambulatory Visit: Payer: Self-pay | Admitting: Osteopathic Medicine

## 2020-06-01 DIAGNOSIS — E039 Hypothyroidism, unspecified: Secondary | ICD-10-CM

## 2020-06-01 LAB — TSH: TSH: 0.02 mIU/L — ABNORMAL LOW (ref 0.40–4.50)

## 2020-06-02 ENCOUNTER — Encounter: Payer: Self-pay | Admitting: Osteopathic Medicine

## 2020-06-02 ENCOUNTER — Inpatient Hospital Stay: Payer: Self-pay | Attending: Hematology

## 2020-06-02 ENCOUNTER — Inpatient Hospital Stay: Payer: Self-pay

## 2020-06-02 ENCOUNTER — Other Ambulatory Visit: Payer: Self-pay

## 2020-06-02 DIAGNOSIS — C169 Malignant neoplasm of stomach, unspecified: Secondary | ICD-10-CM

## 2020-06-02 DIAGNOSIS — E538 Deficiency of other specified B group vitamins: Secondary | ICD-10-CM | POA: Insufficient documentation

## 2020-06-02 DIAGNOSIS — Z95828 Presence of other vascular implants and grafts: Secondary | ICD-10-CM

## 2020-06-02 DIAGNOSIS — C162 Malignant neoplasm of body of stomach: Secondary | ICD-10-CM | POA: Insufficient documentation

## 2020-06-02 DIAGNOSIS — D500 Iron deficiency anemia secondary to blood loss (chronic): Secondary | ICD-10-CM

## 2020-06-02 LAB — CBC WITH DIFFERENTIAL (CANCER CENTER ONLY)
Abs Immature Granulocytes: 0 10*3/uL (ref 0.00–0.07)
Basophils Absolute: 0 10*3/uL (ref 0.0–0.1)
Basophils Relative: 1 %
Eosinophils Absolute: 0.4 10*3/uL (ref 0.0–0.5)
Eosinophils Relative: 9 %
HCT: 30.6 % — ABNORMAL LOW (ref 36.0–46.0)
Hemoglobin: 10.8 g/dL — ABNORMAL LOW (ref 12.0–15.0)
Immature Granulocytes: 0 %
Lymphocytes Relative: 39 %
Lymphs Abs: 1.9 10*3/uL (ref 0.7–4.0)
MCH: 32.4 pg (ref 26.0–34.0)
MCHC: 35.3 g/dL (ref 30.0–36.0)
MCV: 91.9 fL (ref 80.0–100.0)
Monocytes Absolute: 0.4 10*3/uL (ref 0.1–1.0)
Monocytes Relative: 7 %
Neutro Abs: 2.2 10*3/uL (ref 1.7–7.7)
Neutrophils Relative %: 44 %
Platelet Count: 168 10*3/uL (ref 150–400)
RBC: 3.33 MIL/uL — ABNORMAL LOW (ref 3.87–5.11)
RDW: 12.4 % (ref 11.5–15.5)
WBC Count: 5 10*3/uL (ref 4.0–10.5)
nRBC: 0 % (ref 0.0–0.2)

## 2020-06-02 LAB — CMP (CANCER CENTER ONLY)
ALT: 26 U/L (ref 0–44)
AST: 26 U/L (ref 15–41)
Albumin: 3.4 g/dL — ABNORMAL LOW (ref 3.5–5.0)
Alkaline Phosphatase: 71 U/L (ref 38–126)
Anion gap: 6 (ref 5–15)
BUN: 11 mg/dL (ref 6–20)
CO2: 24 mmol/L (ref 22–32)
Calcium: 9.1 mg/dL (ref 8.9–10.3)
Chloride: 108 mmol/L (ref 98–111)
Creatinine: 0.53 mg/dL (ref 0.44–1.00)
GFR, Estimated: >60 mL/min (ref >60)
Glucose, Bld: 81 mg/dL (ref 70–99)
Potassium: 3.7 mmol/L (ref 3.5–5.1)
Sodium: 138 mmol/L (ref 135–145)
Total Bilirubin: 0.3 mg/dL (ref 0.3–1.2)
Total Protein: 6.9 g/dL (ref 6.5–8.1)

## 2020-06-02 LAB — FERRITIN: Ferritin: 933 ng/mL — ABNORMAL HIGH (ref 11–307)

## 2020-06-02 LAB — VITAMIN B12: Vitamin B-12: 397 pg/mL (ref 180–914)

## 2020-06-02 MED ORDER — SODIUM CHLORIDE 0.9% FLUSH
10.0000 mL | Freq: Once | INTRAVENOUS | Status: AC
  Administered 2020-06-02: 10 mL
  Filled 2020-06-02: qty 10

## 2020-06-02 MED ORDER — HEPARIN SOD (PORK) LOCK FLUSH 100 UNIT/ML IV SOLN
500.0000 [IU] | Freq: Once | INTRAVENOUS | Status: AC
  Administered 2020-06-02: 500 [IU]
  Filled 2020-06-02: qty 5

## 2020-06-02 NOTE — Patient Instructions (Signed)

## 2020-06-03 LAB — CANCER ANTIGEN 19-9: CA 19-9: 6 U/mL (ref 0–35)

## 2020-06-05 ENCOUNTER — Other Ambulatory Visit: Payer: Self-pay

## 2020-06-05 ENCOUNTER — Encounter: Payer: Self-pay | Admitting: Hematology

## 2020-06-05 DIAGNOSIS — E039 Hypothyroidism, unspecified: Secondary | ICD-10-CM

## 2020-06-05 MED ORDER — LEVOTHYROXINE SODIUM 200 MCG PO TABS: 200.0000 ug | ORAL_TABLET | Freq: Every day | ORAL | 5 refills | Status: DC

## 2020-06-19 ENCOUNTER — Ambulatory Visit: Payer: BC Managed Care – PPO | Admitting: Physical Therapy

## 2020-07-14 NOTE — Progress Notes (Signed)
[place]   [phone] [phone]) [phone]   Clinic Follow up Note   Patient Care Team: Emeterio Reeve, DO as PCP - General (Osteopathic Medicine) [name], MD as Consulting Physician (General Surgery) [name], MD as Consulting Physician (Hematology) Alla Feeling, NP as Nurse Practitioner (Nurse Practitioner)  Date of Service:  07/19/2020  CHIEF COMPLAINT: F/u of Gastric Cancer  SUMMARY OF ONCOLOGIC HISTORY: Oncology History Overview Note  Cancer Staging Gastric cancer [facility]) Staging form: Stomach, AJCC 8th Edition - Clinical stage from 03/29/2018: Stage IVA (cT4b, cN1, cM0) - Signed by [name], MD on 04/03/2018 - Pathologic stage from 10/02/2018: ypT0, pN0, cM0 - Signed by Alla Feeling, NP on 01/19/2019     Gastric cancer ([place])  03/19/2018 Procedure   Upper Endoscopy by Dr. Michail Sermon 03/29/18  IMPRESSION - Normal esophagus. - Z-line regular, 38 cm from the incisors. - Non-obstructing oozing gastric ulcer with pigmented material. Biopsied. - Normal examined duodenum. - Acute gastritis.   03/29/2018 Procedure   Colonoscopy by Dr. Michail Sermon 03/29/18 IMPRESSION - Preparation of the colon was fair. - Internal hemorrhoids. - The examined portion of the ileum was normal. - No specimens collected.   03/29/2018 Initial Biopsy   Diagnosis 03/29/18 Stomach, biopsy, Proximal - ADENOCARCINOMA. - GOBLET CELL METAPLASIA. - SEE COMMENT. Microscopic Comment A Warthin Starry stain is negative for the presence of Helicobacter pylori organisms. Dr Vic Ripper has reviewed the case and concurs with this interpretation. Dr Michail Sermon was paged on 03/31/2018. (JBK:ecj 03/31/2018)   03/29/2018 Cancer Staging   Staging form: Stomach, AJCC 8th Edition - Clinical stage from 03/29/2018: Stage IVA (cT4b, cN1, cM0) - Signed by [name], MD on 04/03/2018   04/02/2018 Initial Diagnosis   Gastric cancer ([place])   04/02/2018 Imaging   CT CAP W Contrast  04/02/18 IMPRESSION: 1. Focal thickening lateral wall proximal stomach with protrusion of low-attenuation soft tissue beyond the expected confines of the gastric wall into the splenic hilum. Imaging features highly concerning for transmural tumor extension into the splenic hilum. Several small nodules in this region are likely lymph nodes, concerning for metastatic disease. 2. Scattered bilateral pulmonary nodules measuring up to 5 mm. Nonspecific, but close attention on follow-up recommended as metastatic disease not excluded. 3. Small lymph nodes in the gastrohepatic ligament, but there is no gastrohepatic or hepato duodenal ligament lymphadenopathy. No evidence for liver metastases. 4.  Aortic Atherosclerois (ICD10-170.0   04/15/2018 - 07/22/2018 Chemotherapy   FLOT4 every 2 weeks starting 04/15/18. She completed 8 cycles on 07/22/18.   08/21/2018 Imaging   CT CAP W Contrast  IMPRESSION: 1. No evidence of residual gastric mass. No abdominopelvic adenopathy. 2. Similar bilateral pulmonary nodules, indeterminate. 3. Development of upper lung and peribronchovascular predominant pulmonary opacities. Differential considerations include drug toxicity or atypical infection. Lymphangitic tumor spread felt less likely but cannot be excluded. Consider chest CT follow-up at 3-6 months. 4.  Aortic Atherosclerosis (ICD10-I70.0).    10/02/2018 Surgery   LAPAROSCOPY DIAGNOSTIC, TOTAL GASTRECTOMY, FEEDING TUBE PLACEMENT by Dr. 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 - ADENOCARCINOMA IS NOT IDENTIFIED. - THERE IS NO EVIDENCE OF CARCINOMA IN 34 OF 34 LYMPH NODES (0/34). - SEE ONCOLOGY TABLE BELOW.   10/02/2018 Cancer Staging   Staging form: Stomach, AJCC 8th Edition - Pathologic stage from 10/02/2018: ypT0, pN0, cM0 - Signed by Alla Feeling, NP on 01/19/2019  01/20/2019 Survivorship   Per Cira Rue, NP    03/10/2019 Imaging   CT CAP W contrast IMPRESSION: 1. Interval Roux-en-Y esophagojejunostomy with no the definite findings of metastatic disease in the chest, abdomen, or pelvis. 2. Multiple irregular bilateral pulmonary nodules are smaller than on the previous study. This may be infectious/inflammatory etiology but continued close attention on follow-up recommended. 3. Central mesenteric lymph nodes measure upper normal for size and are slightly increased since the preoperative exam. Attention on follow-up recommended. 4.  Aortic Atherosclerois (ICD10-170.0)   08/31/2019 Imaging   CT CAP w contrast  IMPRESSION: 1. Stable exam. No new or progressive findings in the chest, abdomen, or pelvis. 2. Numerous bilateral irregular nodular opacities in both lungs are stable in the interval. No new suspicious pulmonary nodule or mass. 3. Stable borderline enlarged portocaval lymph node. 4. Aortic Atherosclerosis (ICD10-I70.0).   07/17/2020 Imaging   CT CAP  IMPRESSION: 1. No substantial interval change in exam. No new or progressive findings to suggest recurrent or metastatic disease in the chest, abdomen, or pelvis. 2. Bilateral pulmonary nodules, similar to prior. Given upper lung predominance and interval stability, these are probably infectious/inflammatory. Continued attention on follow-up recommended. 3. Aortic Atherosclerosis (ICD10-I70.0).      CURRENT THERAPY:  Gastric Cancer Surveillance B12 injection q3weeks at home.    INTERVAL HISTORY:  Mariah Kemp is here for a follow up of gastric cancer. She was last seen by me 3 months ago. She presents to the clinic alone. She has been able to gain a few pounds in the past 2 months. She notes she had abdominal severe cramps. She notes her BMs were normal. This did resolve after a few days. She denies cough, SOB but has allergies. She uses her inhaler slightly more. She notes  she does not notice much change with her B12 injections.  She plans to see endocrinologist today to consult about her Thyroid condition. She notes she is really tired. She notes she currently lives in [place]. [place] but wants to continue to be seen at our clinic.    REVIEW OF SYSTEMS:   Constitutional: Denies fevers, chills or abnormal weight loss (+) Fatigue  Eyes: Denies blurriness of vision Ears, nose, mouth, throat, and face: Denies mucositis or sore throat Respiratory: Denies cough, dyspnea or wheezes Cardiovascular: Denies palpitation, chest discomfort (+) lower extremity swelling Gastrointestinal:  Denies nausea, heartburn or change in bowel habits Skin: Denies abnormal skin rashes Lymphatics: Denies new lymphadenopathy or easy bruising Neurological:Denies numbness, tingling or new weaknesses Behavioral/Psych: Mood is stable, no new changes  All other systems were reviewed with the patient and are negative.  MEDICAL HISTORY:  Past Medical History:  Diagnosis Date  . Anemia   . Asthma   . Blood transfusion without reported diagnosis   . Breast cancer ([place])   . Dyspnea    over exertion  . Hypertension   . Hypothyroidism   . Stomach cancer [facility])     SURGICAL HISTORY: Past Surgical History:  Procedure Laterality Date  . ABDOMINAL HYSTERECTOMY  1996  . BARIATRIC SURGERY     stomach removal  . BILATERAL SALPINGOOPHORECTOMY  2007  . BIOPSY  03/29/2018   Procedure: BIOPSY;  Surgeon: Wilford Corner, MD;  Location: [facility];  Service: Endoscopy;;  . CARPAL TUNNEL RELEASE    . COLONOSCOPY N/A 03/29/2018   Procedure: COLONOSCOPY;  Surgeon: Wilford Corner, MD;  [facility];  Service: Endoscopy;  Laterality: N/A;  . COLONOSCOPY    . ESOPHAGOGASTRODUODENOSCOPY  N/A 03/29/2018   Procedure: ESOPHAGOGASTRODUODENOSCOPY (EGD);  Surgeon: Wilford Corner, MD;  Location: [facility];  Service: Endoscopy;  Laterality: N/A;  . GASTRECTOMY N/A 10/02/2018   Procedure: TOTAL  GASTRECTOMY;  Surgeon: [name], MD;  Location: [place];  Service: General;  Laterality: N/A;  . GASTROSTOMY N/A 10/02/2018   Procedure: FEEDING TUBE PLACEMENT;  Surgeon: [name], MD;  Location: [facility];  Service: General;  Laterality: N/A;  . LAPAROSCOPY N/A 04/13/2018   Procedure: LAPAROSCOPY DIAGNOSTIC;  Surgeon: [name], MD;  Location: [facility];  Service: General;  Laterality: N/A;  . LAPAROSCOPY N/A 10/02/2018   Procedure: LAPAROSCOPY DIAGNOSTIC;  Surgeon: [name], MD;  Location: [facility];  Service: General;  Laterality: N/A;  . MASTECTOMY  2007  . PORTACATH PLACEMENT Left 04/13/2018   Procedure: INSERTION PORT-A-CATH ERAS PATHWAY;  Surgeon: [name], MD;  Location: [facility];  Service: General;  Laterality: Left;  subclavian  . UPPER GASTROINTESTINAL ENDOSCOPY      I have reviewed the social history and family history with the patient and they are unchanged from previous note.  ALLERGIES:  has No Known Allergies.  MEDICATIONS:  Current Outpatient Medications  Medication Sig Dispense Refill  . acyclovir (ZOVIRAX) 400 MG tablet Take 1 tablet (400 mg total) by mouth 3 (three) times daily. For five days. As needed for cold sore. (Patient taking differently: Take 400 mg by mouth 3 (three) times daily as needed (cold sores.). ) 15 tablet 0  . albuterol (VENTOLIN HFA) 108 (90 Base) MCG/ACT inhaler Inhale 1-2 puffs into the lungs every 4 (four) hours as needed for wheezing or shortness of breath. 18 g 11  . baclofen (LIORESAL) 10 MG tablet Take 1 tablet (10 mg total) by mouth 4 (four) times daily as needed for muscle spasms. (Patient not taking: Reported on 01/13/2020) 30 each 0  . calcium carbonate (TUMS - DOSED IN MG ELEMENTAL CALCIUM) 500 MG chewable tablet Chew 1 tablet (200 mg of elemental calcium total) by mouth 3 (three) times daily. 90 tablet 11  . cyanocobalamin (,VITAMIN B-12,) 1000 MCG/ML injection Inject 1 mL (1,000 mcg total) into the muscle once a week. 4 mL 6  .  fluticasone-salmeterol (ADVAIR HFA) 115-21 MCG/ACT inhaler Inhale 2 puffs into the lungs 2 (two) times daily. 3 Inhaler 99  . levothyroxine (SYNTHROID) 200 MCG tablet Take 1 tablet (200 mcg total) by mouth daily before breakfast. 30 tablet 5  . lidocaine-prilocaine (EMLA) cream Apply 1 application topically daily as needed (prior to port being accessed). 30 g 1  . loperamide (IMODIUM) 2 MG capsule Take 1 capsule (2 mg total) by mouth 2 (two) times daily as needed for diarrhea or loose stools. 30 capsule 0  . Needles & Syringes MISC 25 ga x 1 " IM needle/syringe 6 each prn   No current facility-administered medications for this visit.    PHYSICAL EXAMINATION: ECOG PERFORMANCE STATUS: 1 - Symptomatic but completely ambulatory  Vitals:   07/19/20 1013  BP: 138/82  Pulse: 63  Resp: 18  Temp: (!) 97.2 F (36.2 C)  SpO2: 98%   Filed Weights   07/19/20 1013  Weight: 122 lb 12.8 oz (55.7 kg)    GENERAL:alert, no distress and comfortable SKIN: skin color, texture, turgor are normal, no rashes or significant lesions EYES: normal, Conjunctiva are pink and non-injected, sclera clear  NECK: supple, thyroid normal size, non-tender, without nodularity LYMPH:  no palpable lymphadenopathy in the cervical, axillary  LUNGS: clear to auscultation and percussion with  normal breathing effort HEART: regular rate & rhythm and no murmurs (+) Mild lower extremity edema at ankles.  ABDOMEN:abdomen soft, non-tender and normal bowel sounds Musculoskeletal:no cyanosis of digits and no clubbing  NEURO: alert & oriented x 3 with fluent speech, no focal motor/sensory deficits  LABORATORY DATA:  I have reviewed the data as listed CBC Latest Ref Rng & Units 07/17/2020 06/02/2020 04/21/2020  WBC 4.0 - 10.5 K/uL 5.3 5.0 4.3  Hemoglobin 12.0 - 15.0 g/dL 11.1(L) 10.8(L) 10.8(L)  Hematocrit 36.0 - 46.0 % 31.6(L) 30.6(L) 32.0(L)  Platelets 150 - 400 K/uL 197 168 162     CMP Latest Ref Rng & Units 07/17/2020 06/02/2020  04/21/2020  Glucose 70 - 99 mg/dL 78 81 81  BUN 6 - 20 mg/dL 11 11 17   Creatinine 0.44 - 1.00 mg/dL 0.54 0.53 0.61  Sodium 135 - 145 mmol/L 139 138 143  Potassium 3.5 - 5.1 mmol/L 3.8 3.7 3.9  Chloride 98 - 111 mmol/L 103 108 107  CO2 22 - 32 mmol/L 27 24 25   Calcium 8.9 - 10.3 mg/dL 9.1 9.1 9.3  Total Protein 6.5 - 8.1 g/dL 7.0 6.9 7.0  Total Bilirubin 0.3 - 1.2 mg/dL 0.2(L) 0.3 0.3  Alkaline Phos 38 - 126 U/L 76 71 77  AST 15 - 41 U/L 77(H) 26 19  ALT 0 - 44 U/L 100(H) 26 14      RADIOGRAPHIC STUDIES: I have personally reviewed the radiological images as listed and agreed with the findings in the report. No results found.   ASSESSMENT & PLAN:  Mariah Kemp is a 60 y.o. female with    1. 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 Given her locally advanced disease, she completed 8 cycles ofneo-adjuvant chemoFLOT4. -She underwent total Gastrectomyand lymph node dissectionand had feeding tube placed on 10/02/18.She hadcomplete pathological response with allnodesnegative.  -Given herpCR, I do not recommend moreadjuvant radiation or chemotherapy. She is currently on surveillance.  -I recommend she keep her PAC in place for at least a year given her risk of recurrence. Will continue flushes every4-8weeks. -Her CT CAP from 07/17/20 shows No new or progressive findings to suggest recurrent or metastatic disease in the chest, abdomen, or pelvis. She does have Bilateral pulmonary nodules, similar to prior and these are probably infectious/inflammatory. I personally reviewed scan images with patient today.  -She is clinically doing well. Labs reviewed, CBC and CMP WNL except Hg 11.1, albumin 3.4, AST 77, ALT 100. Physical exam unremarkable.  -She is over 2 years since her cancer diagnosis. Continue surveillance.  -She still has PAC in place. She is fine to remove this now and can return to Dr Barry Dienes  for removal. If she keeps port, will do flush every 6-8 weeks.  -F/u with Dr Barry Dienes in 3 months and F/u with me in 6 months.   2.Weight losss/p gastric surgery, Acid reflux -After months of major weight loss then fluctuating weight, her weight has been stable recently. Will continue to manage diet closely -She most recently has been able to gain a few pounds in early 2022.  -She has Acid reflux and gas s/p gastric surgery. She will continue Tums as needed.   3. Anemia,B12 deficiency S/p gastric surgery -Given 3u blood transfusion on 1/15/20andIVFeraheme in1/2020 and2-05/2019. -Started monthly B12 injections in 01/2019.Switched to home injections after 09/03/19, now every 3 weeks.  -Her anemia is mild and her ferritin is elevates. Hg at 11.1 and Ferritin  525 (07/19/20).  4. H/o stage 3 right breast cancer in 2007,GeneticTesting was not pursued for now due to cost -Treated with right mastectomy, reconstruction with 3 LN removed. She underwent chemotherapy for 5 months, likely AC-T. She completed Tamoxifen for at least 5 years.   5. HTN, hypothyroidism, Health maintenance  -Continue to follow-up with PCP -Her 03/2018 Colonoscopy was overall normal and benign. Recommended to repeat 10 years later.  -She is overdue for mammogram. I recommend she continue yearly. I will order to be done in February or March 2022. She is agreeable.    PLAN: -CT CAP reviewed, NED, her pulmonary nodules are stable  -Lab and Port flush in 6 weeks  -Copy note to Dr Barry Dienes for [facility] removal. She will see Dr. Barry Dienes in 2-3 months  -Lab and F/u in 6 months    No problem-specific Assessment & Plan notes found for this encounter.   Orders Placed This Encounter  Procedures  . Vitamin B12    Standing Status:   Future    Standing Expiration Date:   07/19/2021  . Iron and TIBC    Standing Status:   Future    Standing Expiration Date:   07/19/2021   All questions were answered. The patient knows to call  the clinic with any problems, questions or concerns. No barriers to learning was detected. The total time spent in the appointment was 30 minutes.     [name], MD 07/19/2020   I, Joslyn Devon, am acting as scribe for [name], MD.   I have reviewed the above documentation for accuracy and completeness, and I agree with the above.

## 2020-07-15 IMAGING — RF DG UGI W/ HIGH DENSITY W/O KUB
11 of 12 series · 14 of 24 positions shown · non-contrast
Comparison: 10/16/2018

CLINICAL DATA: Status post gastrectomy with gastrojejunal
anastomosis.

EXAM:
UPPER GI SERIES WITH KUB
TECHNIQUE: After obtaining a scout radiograph a routine upper GI series was
performed using thin barium.
FLUOROSCOPY TIME:  Fluoroscopy Time:  2 minutes and 18 seconds.
Radiation Exposure Index (if provided by the fluoroscopic device):
154 mGy
Number of Acquired Spot Images:

[Series 1: one shot · 0.14mm/px · 1 of 1 slices shown (1 of 2)]
[im 1/1]
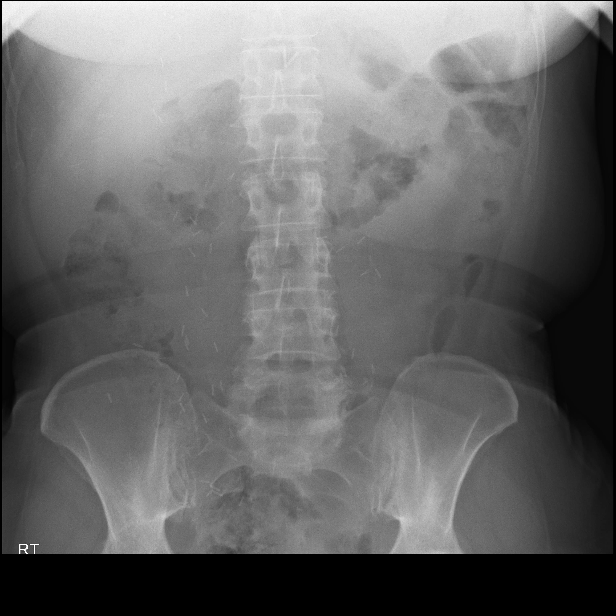

[Series 2: sequence · 0.29mm/px · 1 of 11 frames shown (1 of 9)]
[frame 10/11]
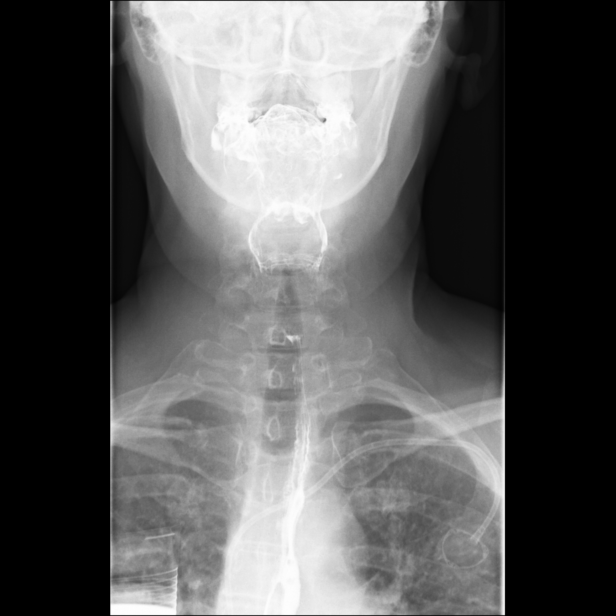

[Series 3: sequence · 0.29mm/px · 1 of 8 frames shown (2 of 9)]
[frame 5/8]
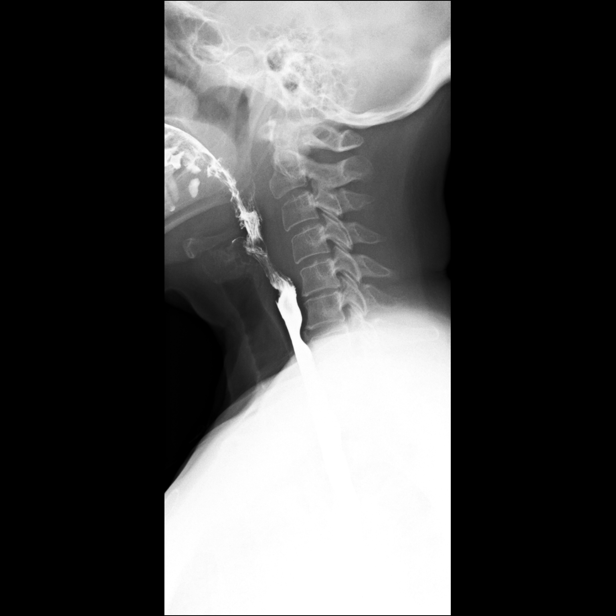

[Series 4: sequence · 0.29mm/px · 2 of 28 frames shown (3 of 9)]
[frame 15/28]
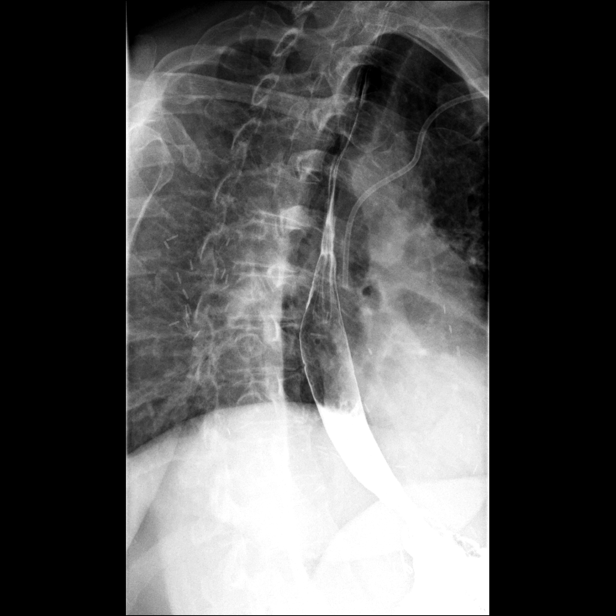
[frame 24/28]
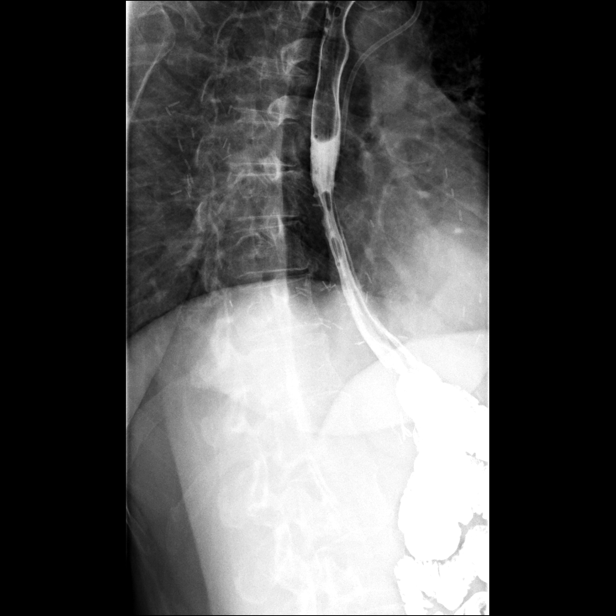

[Series 6: sequence · 0.29mm/px · 1 of 1 slices shown (4 of 9)]
[im 1/1]
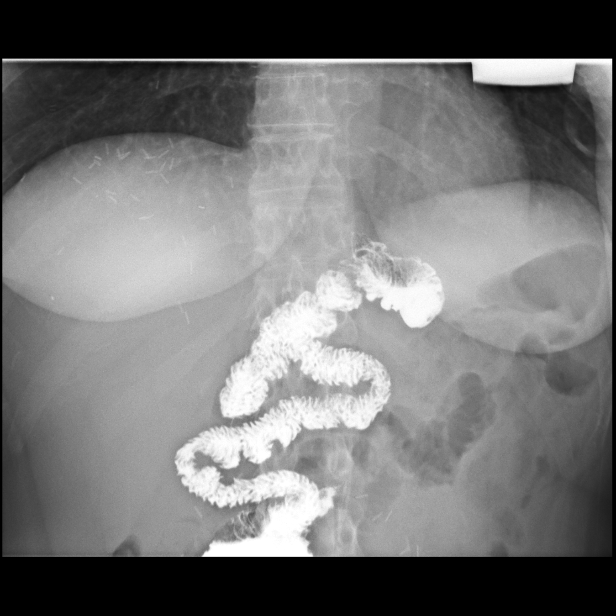

[Series 7: sequence · 0.29mm/px · 1 of 16 frames shown (5 of 9)]
[frame 13/16]
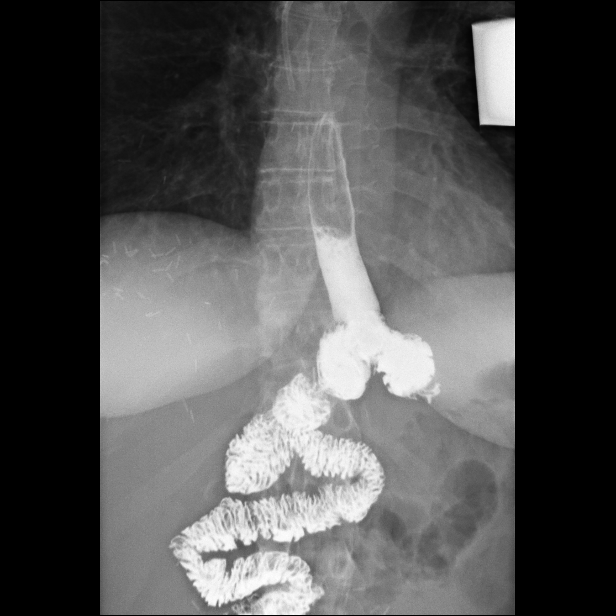

[Series 8: one shot · 1 of 1 slices shown (2 of 2)]
[im 1/1]
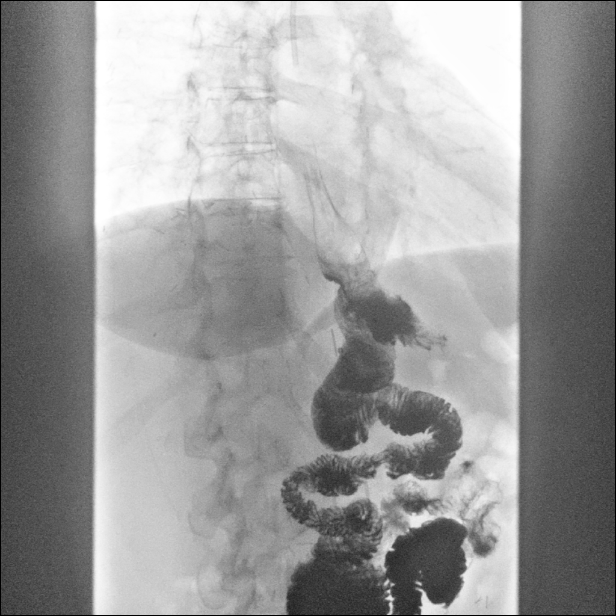

[Series 9: sequence · 1 of 58 frames shown (6 of 9)]
[frame 30/58]
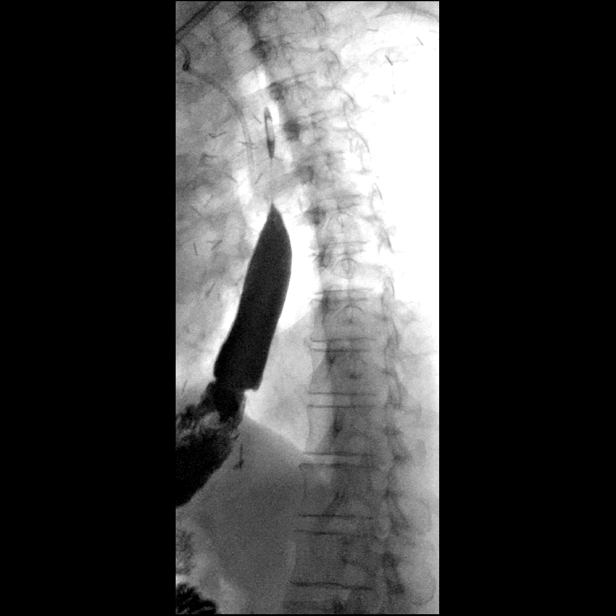

[Series 10: sequence · 1 of 50 frames shown (7 of 9)]
[frame 26/50]
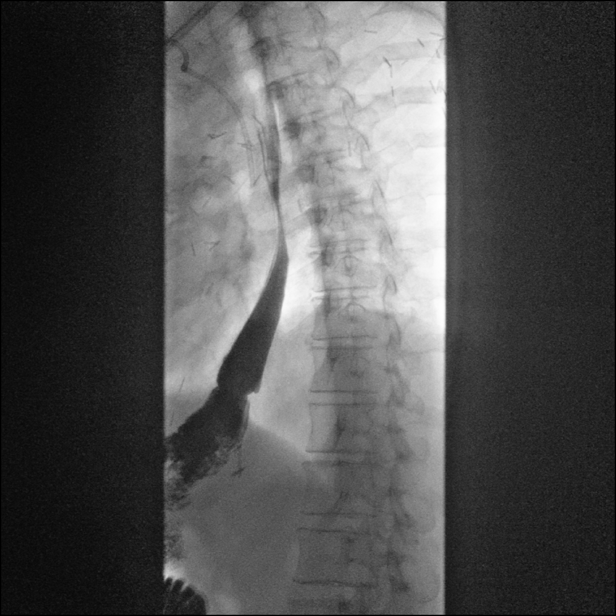

[Series 11: sequence · 2 of 32 frames shown (8 of 9)]
[frame 5/32]
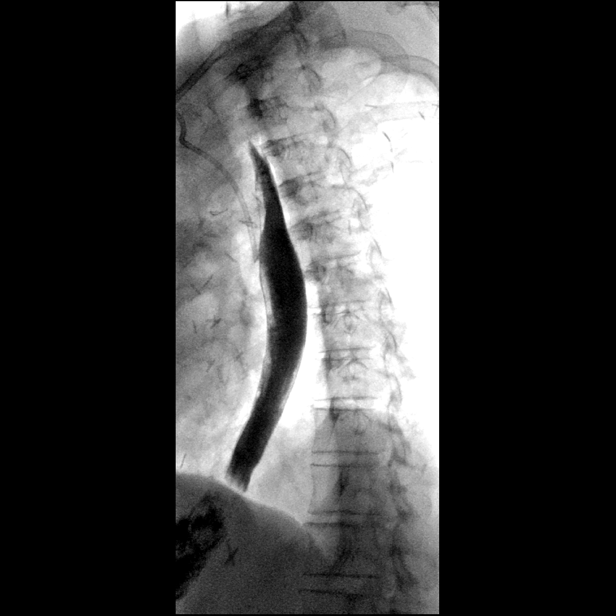
[frame 17/32]
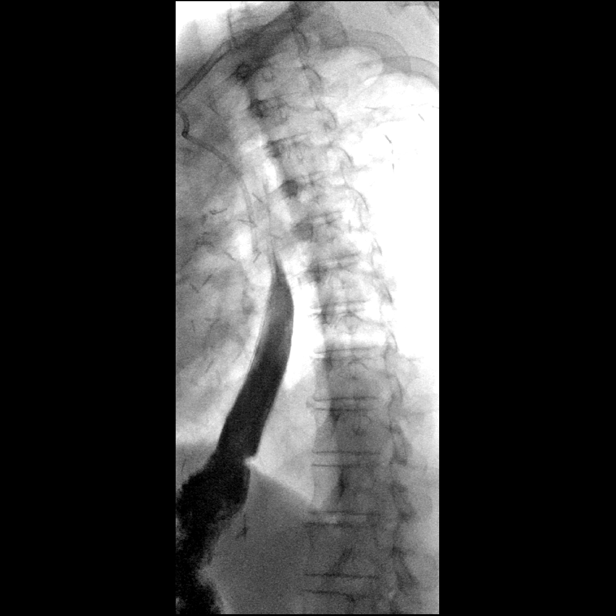

[Series 12: sequence · 2 of 32 frames shown (9 of 9)]
[frame 3/32]
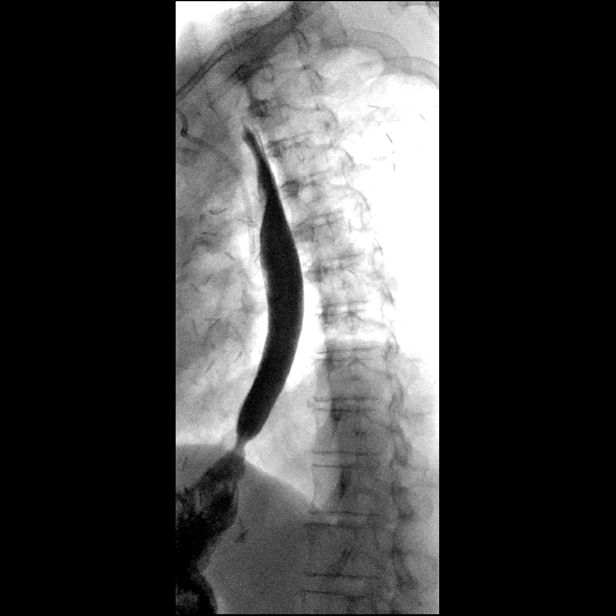
[frame 28/32]
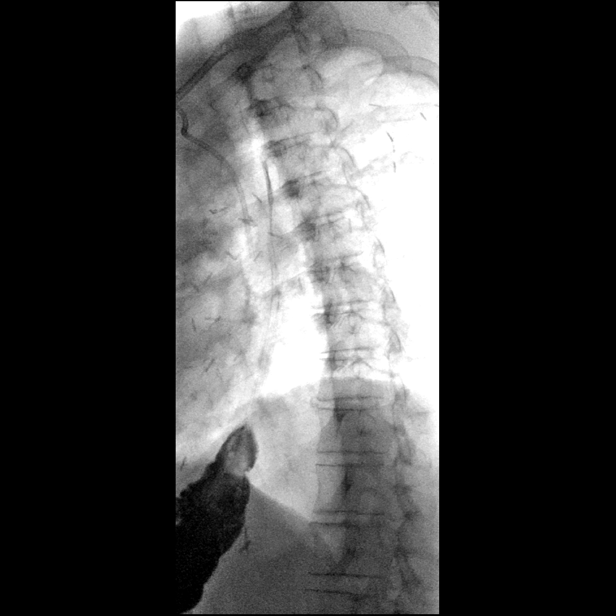

[14 of 24 positions shown; findings below may reference images not displayed]

FINDINGS: Pre-procedure KUB shows numerous surgical clips overlying the
central abdomen and pelvis. Bowel gas pattern is normal.

Frontal and lateral views of the hypopharynx while swallowing thick
barium are normal. Single contrast evaluation of the esophagus
reveals no diverticulum, stricture, mass lesion, or gross mucosal
ulceration. While the patient was in an upright position, 13 mm
barium tablet was given with water. The barium tablet passes readily
through the anastomosis into the jejunum, but did reflux back up
into the distal esophagus while the patient was upright. When the
tablet refluxed back into the distal esophagus, the patient stated
that she felt like the tablet was stuck at this location and that
these with the symptoms she is having at home.

Patient was subsequently placed RAO prone on the horizontal fluoro
table and monitored fluoroscopically while swallowing. There is good
preservation of primary peristalsis without tertiary contractions or
presbyesophagus. No evidence for herniation of small bowel into the
lower thorax. Of note, while the patient was in this position, the
barium tablet was observed refluxing back and forth through the
esophagojejunostomy.

There is brisk migration of contrast down the efferent the limb
which is nondilated and shows normal mucosal fold thickness. There
is no evidence of a narrowing or stricture in the limb that would
obstruct passage of the barium tablet.
IMPRESSION: 1. Expected postoperative appearance in this patient status post
gastrectomy with esophagojejunal anastomosis.
2. No esophageal stricture or diverticulum. No gross mucosal
ulceration. Esophageal motility is normal.
3. Normal appearance of the efferent limb.
4. 13 mm barium tablet passed readily through the anastomosis but
did reflux back and forth through the anastomosis for a period of
time after it was swallowed.

## 2020-07-17 ENCOUNTER — Encounter (HOSPITAL_COMMUNITY): Payer: Self-pay

## 2020-07-17 ENCOUNTER — Ambulatory Visit (HOSPITAL_COMMUNITY)
Admission: RE | Admit: 2020-07-17 | Discharge: 2020-07-17 | Disposition: A | Discharge status: Home / Self Care | Payer: 59 | Source: Ambulatory Visit | Attending: Hematology | Admitting: Hematology

## 2020-07-17 ENCOUNTER — Other Ambulatory Visit: Payer: Self-pay

## 2020-07-17 ENCOUNTER — Inpatient Hospital Stay: Payer: 59 | Attending: Hematology

## 2020-07-17 ENCOUNTER — Other Ambulatory Visit: Payer: BC Managed Care – PPO

## 2020-07-17 DIAGNOSIS — C169 Malignant neoplasm of stomach, unspecified: Secondary | ICD-10-CM

## 2020-07-17 DIAGNOSIS — R918 Other nonspecific abnormal finding of lung field: Secondary | ICD-10-CM | POA: Insufficient documentation

## 2020-07-17 DIAGNOSIS — I10 Essential (primary) hypertension: Secondary | ICD-10-CM | POA: Insufficient documentation

## 2020-07-17 DIAGNOSIS — Z9221 Personal history of antineoplastic chemotherapy: Secondary | ICD-10-CM | POA: Insufficient documentation

## 2020-07-17 DIAGNOSIS — K219 Gastro-esophageal reflux disease without esophagitis: Secondary | ICD-10-CM | POA: Insufficient documentation

## 2020-07-17 DIAGNOSIS — C162 Malignant neoplasm of body of stomach: Secondary | ICD-10-CM | POA: Diagnosis not present

## 2020-07-17 DIAGNOSIS — Z9011 Acquired absence of right breast and nipple: Secondary | ICD-10-CM | POA: Insufficient documentation

## 2020-07-17 DIAGNOSIS — E039 Hypothyroidism, unspecified: Secondary | ICD-10-CM | POA: Insufficient documentation

## 2020-07-17 DIAGNOSIS — E538 Deficiency of other specified B group vitamins: Secondary | ICD-10-CM | POA: Insufficient documentation

## 2020-07-17 DIAGNOSIS — D500 Iron deficiency anemia secondary to blood loss (chronic): Secondary | ICD-10-CM

## 2020-07-17 DIAGNOSIS — D649 Anemia, unspecified: Secondary | ICD-10-CM | POA: Insufficient documentation

## 2020-07-17 DIAGNOSIS — Z853 Personal history of malignant neoplasm of breast: Secondary | ICD-10-CM | POA: Insufficient documentation

## 2020-07-17 DIAGNOSIS — Z95828 Presence of other vascular implants and grafts: Secondary | ICD-10-CM

## 2020-07-17 LAB — CBC WITH DIFFERENTIAL (CANCER CENTER ONLY)
Abs Immature Granulocytes: 0.01 10*3/uL (ref 0.00–0.07)
Basophils Absolute: 0.1 10*3/uL (ref 0.0–0.1)
Basophils Relative: 1 %
Eosinophils Absolute: 0.4 10*3/uL (ref 0.0–0.5)
Eosinophils Relative: 7 %
HCT: 31.6 % — ABNORMAL LOW (ref 36.0–46.0)
Hemoglobin: 11.1 g/dL — ABNORMAL LOW (ref 12.0–15.0)
Immature Granulocytes: 0 %
Lymphocytes Relative: 39 %
Lymphs Abs: 2.1 10*3/uL (ref 0.7–4.0)
MCH: 32.8 pg (ref 26.0–34.0)
MCHC: 35.1 g/dL (ref 30.0–36.0)
MCV: 93.5 fL (ref 80.0–100.0)
Monocytes Absolute: 0.3 10*3/uL (ref 0.1–1.0)
Monocytes Relative: 6 %
Neutro Abs: 2.5 10*3/uL (ref 1.7–7.7)
Neutrophils Relative %: 47 %
Platelet Count: 197 10*3/uL (ref 150–400)
RBC: 3.38 MIL/uL — ABNORMAL LOW (ref 3.87–5.11)
RDW: 13 % (ref 11.5–15.5)
WBC Count: 5.3 10*3/uL (ref 4.0–10.5)
nRBC: 0 % (ref 0.0–0.2)

## 2020-07-17 LAB — CMP (CANCER CENTER ONLY)
ALT: 100 U/L — ABNORMAL HIGH (ref 0–44)
AST: 77 U/L — ABNORMAL HIGH (ref 15–41)
Albumin: 3.4 g/dL — ABNORMAL LOW (ref 3.5–5.0)
Alkaline Phosphatase: 76 U/L (ref 38–126)
Anion gap: 9 (ref 5–15)
BUN: 11 mg/dL (ref 6–20)
CO2: 27 mmol/L (ref 22–32)
Calcium: 9.1 mg/dL (ref 8.9–10.3)
Chloride: 103 mmol/L (ref 98–111)
Creatinine: 0.54 mg/dL (ref 0.44–1.00)
GFR, Estimated: >60 mL/min (ref >60)
Glucose, Bld: 78 mg/dL (ref 70–99)
Potassium: 3.8 mmol/L (ref 3.5–5.1)
Sodium: 139 mmol/L (ref 135–145)
Total Bilirubin: 0.2 mg/dL — ABNORMAL LOW (ref 0.3–1.2)
Total Protein: 7 g/dL (ref 6.5–8.1)

## 2020-07-17 LAB — FERRITIN: Ferritin: 525 ng/mL — ABNORMAL HIGH (ref 11–307)

## 2020-07-17 MED ORDER — HEPARIN SOD (PORK) LOCK FLUSH 100 UNIT/ML IV SOLN
INTRAVENOUS | Status: AC
  Filled 2020-07-17: qty 5

## 2020-07-17 MED ORDER — HEPARIN SOD (PORK) LOCK FLUSH 100 UNIT/ML IV SOLN
500.0000 [IU] | Freq: Once | INTRAVENOUS | Status: AC
  Administered 2020-07-17: 500 [IU] via INTRAVENOUS

## 2020-07-17 MED ORDER — SODIUM CHLORIDE 0.9% FLUSH
10.0000 mL | Freq: Once | INTRAVENOUS | Status: AC
  Administered 2020-07-17: 10 mL
  Filled 2020-07-17: qty 10

## 2020-07-17 MED ORDER — IOHEXOL 300 MG/ML  SOLN
100.0000 mL | Freq: Once | INTRAMUSCULAR | Status: AC | PRN
  Administered 2020-07-17: 100 mL via INTRAVENOUS

## 2020-07-18 LAB — CANCER ANTIGEN 19-9: CA 19-9: 8 U/mL (ref 0–35)

## 2020-07-19 ENCOUNTER — Ambulatory Visit (INDEPENDENT_AMBULATORY_CARE_PROVIDER_SITE_OTHER): Payer: 59 | Admitting: Internal Medicine

## 2020-07-19 ENCOUNTER — Inpatient Hospital Stay (HOSPITAL_BASED_OUTPATIENT_CLINIC_OR_DEPARTMENT_OTHER): Payer: 59 | Admitting: Hematology

## 2020-07-19 ENCOUNTER — Other Ambulatory Visit: Payer: Self-pay

## 2020-07-19 ENCOUNTER — Encounter: Payer: Self-pay | Admitting: Hematology

## 2020-07-19 ENCOUNTER — Encounter: Payer: Self-pay | Admitting: Internal Medicine

## 2020-07-19 VITALS — BP 138/82 | HR 63 | Temp 97.2°F | Resp 18 | Ht 61.0 in | Wt 122.8 lb

## 2020-07-19 VITALS — BP 124/78 | HR 60 | Ht 61.0 in | Wt 122.2 lb

## 2020-07-19 DIAGNOSIS — Z9011 Acquired absence of right breast and nipple: Secondary | ICD-10-CM | POA: Diagnosis not present

## 2020-07-19 DIAGNOSIS — R918 Other nonspecific abnormal finding of lung field: Secondary | ICD-10-CM | POA: Diagnosis not present

## 2020-07-19 DIAGNOSIS — I10 Essential (primary) hypertension: Secondary | ICD-10-CM | POA: Diagnosis not present

## 2020-07-19 DIAGNOSIS — Z853 Personal history of malignant neoplasm of breast: Secondary | ICD-10-CM | POA: Diagnosis not present

## 2020-07-19 DIAGNOSIS — D649 Anemia, unspecified: Secondary | ICD-10-CM | POA: Diagnosis not present

## 2020-07-19 DIAGNOSIS — E039 Hypothyroidism, unspecified: Secondary | ICD-10-CM | POA: Diagnosis not present

## 2020-07-19 DIAGNOSIS — D500 Iron deficiency anemia secondary to blood loss (chronic): Secondary | ICD-10-CM | POA: Diagnosis not present

## 2020-07-19 DIAGNOSIS — E538 Deficiency of other specified B group vitamins: Secondary | ICD-10-CM

## 2020-07-19 DIAGNOSIS — C162 Malignant neoplasm of body of stomach: Secondary | ICD-10-CM

## 2020-07-19 DIAGNOSIS — K219 Gastro-esophageal reflux disease without esophagitis: Secondary | ICD-10-CM | POA: Diagnosis not present

## 2020-07-19 DIAGNOSIS — Z9221 Personal history of antineoplastic chemotherapy: Secondary | ICD-10-CM | POA: Diagnosis not present

## 2020-07-19 LAB — TSH: TSH: 39.12 u[IU]/mL — ABNORMAL HIGH (ref 0.35–4.50)

## 2020-07-19 NOTE — Patient Instructions (Signed)

## 2020-07-19 NOTE — Progress Notes (Signed)
Name: Mariah Kemp  MRN/ DOB: [id number], Oct 31, 1960    Age/ Sex: 60 y.o., female    PCP: Emeterio Reeve, DO   Reason for Endocrinology Evaluation: Hypothyroidism     Date of Initial Endocrinology Evaluation: 07/19/2020     HPI: Mariah Kemp is a 60 y.o. female with a past medical history of Hx of breast Ca ( S/P right mastectomy ) , Hx of gastric cancer HTn and hypothyroidism. The patient presented for initial endocrinology clinic visit on 07/19/2020 for consultative assistance with her hypothyroidism   She has been diagnosed with gastric cancer in 2020. S/P chemo from 1-07/2018 followed by total gastrectomy 09/2018.  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    She has been diagnosed with Hypothyroidism ~ 30 yrs ago . She has been on LT-4 for years.     She is currently on Levothyroxine 200 mcg , has been on half a tablet since 05/31/2020   Weight stable No constipation or diarrhea  No radiation exposure in the past  No local neck symptoms   No Biotin intake  She takes levothyroxine in the morning, not always 30 minutes before eating.  Has been off MVI    No FH of thyroid disease    HISTORY:  Past Medical History:  Past Medical History:  Diagnosis Date  . Anemia   . Asthma   . Blood transfusion without reported diagnosis   . Breast cancer ([place])   . Dyspnea    over exertion  . Hypertension   . Hypothyroidism   . Stomach cancer [facility])    Past Surgical History:  Past Surgical History:  Procedure Laterality Date  . ABDOMINAL HYSTERECTOMY  1996  . BARIATRIC SURGERY     stomach removal  . BILATERAL SALPINGOOPHORECTOMY  2007  . BIOPSY  03/29/2018   Procedure: BIOPSY;  Surgeon: Wilford Corner, MD;  Location: [place];  Service: Endoscopy;;  . CARPAL TUNNEL RELEASE    . COLONOSCOPY N/A 03/29/2018   Procedure: COLONOSCOPY;  Surgeon: Wilford Corner, MD;  Location: [facility]COPY;  Service: Endoscopy;  Laterality: N/A;  . COLONOSCOPY    .  ESOPHAGOGASTRODUODENOSCOPY N/A 03/29/2018   Procedure: ESOPHAGOGASTRODUODENOSCOPY (EGD);  Surgeon: Wilford Corner, MD;  Location: [place];  Service: Endoscopy;  Laterality: N/A;  . GASTRECTOMY N/A 10/02/2018   Procedure: TOTAL GASTRECTOMY;  Surgeon: [name], MD;  Location: [place];  Service: General;  Laterality: N/A;  . GASTROSTOMY N/A 10/02/2018   Procedure: FEEDING TUBE PLACEMENT;  Surgeon: [name], MD;  Location: [place];  Service: General;  Laterality: N/A;  . LAPAROSCOPY N/A 04/13/2018   Procedure: LAPAROSCOPY DIAGNOSTIC;  Surgeon: [name], MD;  Location: [facility];  Service: General;  Laterality: N/A;  . LAPAROSCOPY N/A 10/02/2018   Procedure: LAPAROSCOPY DIAGNOSTIC;  Surgeon: [name], MD;  Location: [place];  Service: General;  Laterality: N/A;  . MASTECTOMY  2007  . PORTACATH PLACEMENT Left 04/13/2018   Procedure: INSERTION PORT-A-CATH ERAS PATHWAY;  Surgeon: [name], MD;  Location: [facility];  Service: General;  Laterality: Left;  subclavian  . UPPER GASTROINTESTINAL ENDOSCOPY        Social History:  reports that she quit smoking about 26 years ago. She has a 7.50 pack-year smoking history. She has never used smokeless tobacco. She reports that she does not drink alcohol and does not use drugs.  Family History: family history includes Cancer in her paternal uncle; Cirrhosis in her mother; Liver cancer in her father.   HOME MEDICATIONS:  Allergies as of 07/19/2020   No Known Allergies     Medication List       Accurate as of Jul 19, 2020  1:31 PM. If you have any questions, ask your nurse or doctor.        acyclovir 400 MG tablet Commonly known as: Zovirax Take 1 tablet (400 mg total) by mouth 3 (three) times daily. For five days. As needed for cold sore. What changed:   when to take this  reasons to take this  additional instructions   Advair HFA 115-21 MCG/ACT inhaler Generic drug: fluticasone-salmeterol Inhale 2 puffs into the lungs 2 (two)  times daily.   albuterol 108 (90 Base) MCG/ACT inhaler Commonly known as: VENTOLIN HFA Inhale 1-2 puffs into the lungs every 4 (four) hours as needed for wheezing or shortness of breath.   baclofen 10 MG tablet Commonly known as: LIORESAL Take 1 tablet (10 mg total) by mouth 4 (four) times daily as needed for muscle spasms.   calcium carbonate 500 MG chewable tablet Commonly known as: TUMS - dosed in mg elemental calcium Chew 1 tablet (200 mg of elemental calcium total) by mouth 3 (three) times daily.   cyanocobalamin 1000 MCG/ML injection Commonly known as: (VITAMIN B-12) Inject 1 mL (1,000 mcg total) into the muscle once a week.   levothyroxine 200 MCG tablet Commonly known as: SYNTHROID Take 1 tablet (200 mcg total) by mouth daily before breakfast.   lidocaine-prilocaine cream Commonly known as: EMLA Apply 1 application topically daily as needed (prior to port being accessed).   loperamide 2 MG capsule Commonly known as: IMODIUM Take 1 capsule (2 mg total) by mouth 2 (two) times daily as needed for diarrhea or loose stools.   Needles & Syringes Misc 25 ga x 1 " IM needle/syringe         REVIEW OF SYSTEMS: A comprehensive ROS was conducted with the patient and is negative except as per HPI      OBJECTIVE:  VS: BP 124/78   Pulse 60   Ht 5\' 1"  (1.549 m)   Wt 122 lb 4 oz (55.5 kg)   SpO2 98%   BMI 23.10 kg/m    Wt Readings from Last 3 Encounters:  07/19/20 122 lb 4 oz (55.5 kg)  07/19/20 122 lb 12.8 oz (55.7 kg)  04/21/20 119 lb 3.2 oz (54.1 kg)     EXAM: General: Pt appears well and is in NAD  Neck: General: Supple without adenopathy. Thyroid: Thyroid size normal.  No goiter or nodules appreciated. No thyroid bruit.  Lungs: Clear with good BS bilat with no rales, rhonchi, or wheezes  Heart: Auscultation: RRR.  Abdomen: Normoactive bowel sounds, soft, nontender, without masses or organomegaly palpable  Extremities:  BL LE: No pretibial edema normal ROM  and strength.  Skin: Hair: Texture and amount normal with gender appropriate distribution Skin Inspection: No rashes Skin Palpation: Skin temperature, texture, and thickness normal to palpation  Neuro: Cranial nerves: II - XII grossly intact  Motor: Normal strength throughout DTRs: 2+ and symmetric in UE without delay in relaxation phase  Mental Status: Judgment, insight: Intact Orientation: Oriented to time, place, and person Mood and affect: No depression, anxiety, or agitation     DATA REVIEWED:   Results for [place], PASCH [facility] (MRN [id number]) as of 07/20/2020 11:58  Ref. Range 07/19/2020 13:37  TSH Latest Ref Range: 0.35 - 4.50 uIU/mL 39.12 (H)    ASSESSMENT/PLAN/RECOMMENDATIONS:   1. Hypothyroidism:  - Pt is clinically  euthyroid  - No local neck symptoms - Pt educated extensively on the correct way to take levothyroxine (first thing in the morning with water, 30 minutes before eating or taking other medications). - Pt encouraged to double dose the following day if she were to miss a dose given long half-life of levothyroxine. - TSH high on LT-4 replacement of 100 mcg   Medications : Stop Levothyroxine 200 mcg  Start Levothyroxine 175 mcg daily     F/U in 4 months Labs in 8 weeks     Signed electronically by: Mack Guise, MD  [facility] Endocrinology  [facility] [address] [place], [place] [address] Phone: [phone] FAX: [phone]   CC: Florinda Marker [address] [facility] [address] [place] [address] Phone: [phone] Fax: [phone]   Return to Endocrinology clinic as below: No future appointments.

## 2020-07-20 ENCOUNTER — Telehealth: Payer: Self-pay | Admitting: Hematology

## 2020-07-20 MED ORDER — LEVOTHYROXINE SODIUM 175 MCG PO TABS: 175.0000 ug | ORAL_TABLET | Freq: Every day | ORAL | 4 refills | Status: DC

## 2020-07-20 NOTE — Telephone Encounter (Signed)
Scheduled follow-up appointments per 5/4 los. Patient is aware. 

## 2020-08-30 ENCOUNTER — Other Ambulatory Visit: Payer: Self-pay

## 2020-08-30 ENCOUNTER — Inpatient Hospital Stay: Payer: 59 | Attending: Hematology

## 2020-08-30 DIAGNOSIS — E538 Deficiency of other specified B group vitamins: Secondary | ICD-10-CM | POA: Insufficient documentation

## 2020-08-30 DIAGNOSIS — C162 Malignant neoplasm of body of stomach: Secondary | ICD-10-CM | POA: Insufficient documentation

## 2020-08-30 DIAGNOSIS — D500 Iron deficiency anemia secondary to blood loss (chronic): Secondary | ICD-10-CM

## 2020-08-30 DIAGNOSIS — Z95828 Presence of other vascular implants and grafts: Secondary | ICD-10-CM

## 2020-08-30 DIAGNOSIS — C169 Malignant neoplasm of stomach, unspecified: Secondary | ICD-10-CM

## 2020-08-30 LAB — CMP (CANCER CENTER ONLY)
ALT: 22 U/L (ref 0–44)
AST: 22 U/L (ref 15–41)
Albumin: 3.2 g/dL — ABNORMAL LOW (ref 3.5–5.0)
Alkaline Phosphatase: 76 U/L (ref 38–126)
Anion gap: 10 (ref 5–15)
BUN: 12 mg/dL (ref 6–20)
CO2: 24 mmol/L (ref 22–32)
Calcium: 9 mg/dL (ref 8.9–10.3)
Chloride: 110 mmol/L (ref 98–111)
Creatinine: 0.56 mg/dL (ref 0.44–1.00)
GFR, Estimated: >60 mL/min (ref >60)
Glucose, Bld: 168 mg/dL — ABNORMAL HIGH (ref 70–99)
Potassium: 3.2 mmol/L — ABNORMAL LOW (ref 3.5–5.1)
Sodium: 144 mmol/L (ref 135–145)
Total Bilirubin: 0.3 mg/dL (ref 0.3–1.2)
Total Protein: 6.2 g/dL — ABNORMAL LOW (ref 6.5–8.1)

## 2020-08-30 LAB — CBC WITH DIFFERENTIAL (CANCER CENTER ONLY)
Abs Immature Granulocytes: 0.01 10*3/uL (ref 0.00–0.07)
Basophils Absolute: 0 10*3/uL (ref 0.0–0.1)
Basophils Relative: 1 %
Eosinophils Absolute: 0.3 10*3/uL (ref 0.0–0.5)
Eosinophils Relative: 9 %
HCT: 28.5 % — ABNORMAL LOW (ref 36.0–46.0)
Hemoglobin: 9.8 g/dL — ABNORMAL LOW (ref 12.0–15.0)
Immature Granulocytes: 0 %
Lymphocytes Relative: 37 %
Lymphs Abs: 1.5 10*3/uL (ref 0.7–4.0)
MCH: 32.6 pg (ref 26.0–34.0)
MCHC: 34.4 g/dL (ref 30.0–36.0)
MCV: 94.7 fL (ref 80.0–100.0)
Monocytes Absolute: 0.3 10*3/uL (ref 0.1–1.0)
Monocytes Relative: 8 %
Neutro Abs: 1.8 10*3/uL (ref 1.7–7.7)
Neutrophils Relative %: 45 %
Platelet Count: 134 10*3/uL — ABNORMAL LOW (ref 150–400)
RBC: 3.01 MIL/uL — ABNORMAL LOW (ref 3.87–5.11)
RDW: 12.2 % (ref 11.5–15.5)
WBC Count: 4 10*3/uL (ref 4.0–10.5)
nRBC: 0 % (ref 0.0–0.2)

## 2020-08-30 LAB — VITAMIN B12: Vitamin B-12: 297 pg/mL (ref 180–914)

## 2020-08-30 LAB — IRON AND TIBC
Iron: 94 ug/dL (ref 41–142)
Saturation Ratios: 36 % (ref 21–57)
TIBC: 263 ug/dL (ref 236–444)
UIBC: 168 ug/dL (ref 120–384)

## 2020-08-30 LAB — FERRITIN: Ferritin: 886 ng/mL — ABNORMAL HIGH (ref 11–307)

## 2020-08-30 MED ORDER — HEPARIN SOD (PORK) LOCK FLUSH 100 UNIT/ML IV SOLN
500.0000 [IU] | Freq: Once | INTRAVENOUS | Status: AC
Start: 2020-08-30 — End: 2020-08-30
  Administered 2020-08-30: 500 [IU]
  Filled 2020-08-30: qty 5

## 2020-08-30 MED ORDER — SODIUM CHLORIDE 0.9% FLUSH
10.0000 mL | Freq: Once | INTRAVENOUS | Status: AC
  Administered 2020-08-30: 10 mL
  Filled 2020-08-30: qty 10

## 2020-08-31 LAB — CANCER ANTIGEN 19-9: CA 19-9: 6 U/mL (ref 0–35)

## 2020-09-01 ENCOUNTER — Telehealth: Payer: Self-pay

## 2020-09-01 NOTE — Telephone Encounter (Signed)
Patient returned call to clinic.  Made aware of Dr Lewayne Bunting results and recommendations.  Patient request a MyChart message be sent with instructions due to her driving.  Message sent.  No further questions or concerns at this time.  Patient knows to call clinic with any questions, concerns or problems.

## 2020-09-01 NOTE — Telephone Encounter (Signed)
-----   Message from [name], MD sent at 09/01/2020  7:00 AM EDT ----- Please let pt know her lab results, iron level good, B12 level on low end of normal, make sure she is taking B12 injection at home, may increase to twice a month if she is on every 3 weeks now. Tumor marker is good, K slightly low, call in KCL 80meq daily for 2 weeks for her. Please add lab in 2-3 months, thanks   [name]  09/01/2020

## 2020-09-13 ENCOUNTER — Other Ambulatory Visit: Payer: 59

## 2020-09-20 ENCOUNTER — Other Ambulatory Visit (INDEPENDENT_AMBULATORY_CARE_PROVIDER_SITE_OTHER): Payer: Medicare Other

## 2020-09-20 ENCOUNTER — Other Ambulatory Visit: Payer: Self-pay

## 2020-09-20 ENCOUNTER — Encounter: Payer: Self-pay | Admitting: Hematology

## 2020-09-20 DIAGNOSIS — E039 Hypothyroidism, unspecified: Secondary | ICD-10-CM | POA: Diagnosis not present

## 2020-09-20 LAB — TSH: TSH: 0.01 u[IU]/mL — ABNORMAL LOW (ref 0.35–5.50)

## 2020-09-20 NOTE — Addendum Note (Signed)
Addended by: Kaylyn Lim I on: 09/20/2020 10:09 AM   Modules accepted: Orders

## 2020-10-23 ENCOUNTER — Other Ambulatory Visit: Payer: Self-pay | Admitting: General Surgery

## 2020-11-06 ENCOUNTER — Encounter (HOSPITAL_COMMUNITY): Payer: Self-pay | Admitting: General Surgery

## 2020-11-06 ENCOUNTER — Other Ambulatory Visit: Payer: Self-pay

## 2020-11-06 NOTE — H&P (Signed)
PROVIDER:  Georgianne Fick, MD   MRN: [id number] DOB: March 23, 1960 DATE OF ENCOUNTER: 10/23/2020 Subjective    Chief Complaint: Gastric Cancer       History of Present Illness: Pt is a 60 yo F referred for consultation by Dr. Burr Medico for a diagnosis of gastric cancer 03/2018.  She presented with pain, extreme fatigue, and tachycardia.  She was found to be anemic and was admitted to the hospital where she got blood transfusions, EGD, and colonoscopy.  Her hgb was 4.4 upon admit.  She was mound to have a large fungating mass with ulcer.  Staging scans were performed which showed potential invasion into the spleen and splenic nodes.  She is getting set up for neoadjuvant chemotherapy.  She is still quite fatigued.  She does not have really abdominal pain, but she does have early satiety.  She denies any significant weight loss.  She has a history of breast cancer which was addressed in [place].  She did require chemo for that.  Her father had liver cancer.     She underwent neoadjuvant chemotherapy.  She tolerated it relatively well.  Her weight was down around 25 pounds with chemotherapy.  Her appetite was down.  She underwent dx laparoscopy followed by total gastrectomy with J tube 10/02/2018.  She did quite well overall.  She had a complete pathologic response.    She had an endoscopy by Dr. Loletha Carrow for dysphagia 01/2020. There was no evidence of stricture.  Repeat restaging scans in May 2022 were negative for recurrent/metastatic disease.     She is doing better maintaining her weight, but still has issues with certain foods feeling like are stuck.  She also has quite a bit of gas pain.  She occasionally will have reflux and wake up at night from this.  She hasn't had any new health issues.       Review of Systems: A complete review of systems was obtained from the patient.  I have reviewed this information and discussed as appropriate with the patient.  See HPI as well for other ROS.   Review of  Systems  All other systems reviewed and are negative.       Medical History: Past Medical History      Past Medical History:  Diagnosis Date   Asthma, unspecified asthma severity, unspecified whether complicated, unspecified whether persistent     History of cancer     Thyroid disease             Patient Active Problem List  Diagnosis   Acquired hypothyroidism, unspecified   Gastric cancer (CMS-HCC)   History of breast cancer   Moderate intermittent asthma without complication   S/P gastrectomy      Past Surgical History       Past Surgical History:  Procedure Laterality Date   GASTROSTOMY N/A     MASTECTOMY            Allergies  No Known Allergies           Current Outpatient Medications on File Prior to Visit  Medication Sig Dispense Refill   levothyroxine (SYNTHROID) 175 MCG tablet Take by mouth       ADVAIR HFA 115-21 mcg/actuation inhaler 2 inhalations 2 (two) times daily        No current facility-administered medications on file prior to visit.      Family History       Family History  Problem Relation Age of Onset   Breast  cancer Mother          Social History       Tobacco Use  Smoking Status Never Smoker  Smokeless Tobacco Never Used      Social History  Social History        Socioeconomic History   Marital status: Married  Tobacco Use   Smoking status: Never Smoker   Smokeless tobacco: Never Used  Scientific laboratory technician Use: Never used  Substance and Sexual Activity   Alcohol use: Not Currently   Drug use: Never        Objective:         Vitals:    10/23/20 1338  BP: 118/70  Pulse: 98  Temp: 36.6 C (97.8 F)  SpO2: 100%  Weight: 53.8 kg (118 lb 9.6 oz)  Height: 154.9 cm ('5\' 1"'$ )    Body mass index is 22.41 kg/m.     Head:   Normocephalic and atraumatic. Thin but not cachectic.  Eyes:    Conjunctivae are normal. Pupils are equal, round, and reactive to light. No scleral icterus.  Neck:   Normal range of motion.  Neck supple. No tracheal deviation present. No thyromegaly present.  Resp:   No respiratory distress, normal effort. Abd:      Abdomen is soft, non distended and non tender. No masses are palpable.  There is no rebound and no guarding. No hernia appreciable.  Neurological: Alert and oriented to person, place, and time. Coordination normal.  Skin:    Skin is warm and dry. No rash noted. No diaphoretic. No erythema. No pallor.  Psychiatric: Normal mood and affect. Normal behavior. Judgment and thought content normal.      Labs, Imaging and Diagnostic Testing:   CT chest/abd/pelvis 07/17/20   IMPRESSION: 1. No substantial interval change in exam. No new or progressive findings to suggest recurrent or metastatic disease in the chest, abdomen, or pelvis. 2. Bilateral pulmonary nodules, similar to prior. Given upper lung predominance and interval stability, these are probably infectious/inflammatory. Continued attention on follow-up recommended. 3. Aortic Atherosclerosis (ICD10-I70.0).       Assessment and Plan:  Diagnoses and all orders for this visit:   Malignant neoplasm of overlapping sites of stomach (CMS-HCC) Assessment & Plan: Will plan port removal.     No clinical evidence of disease.   Dr. Burr Medico to continue restaging scans as needed.     I reviewed port removal and risks, recovery.       S/P gastrectomy Assessment & Plan: Continues to have anticipated issues post total gastrectomy.  Given maintenance of weight now, do not think we need to repeat endoscopy.

## 2020-11-06 NOTE — Progress Notes (Addendum)
PCP - Dr. Emeterio Reeve Cardiologist - denies EKG - DOS - pt claims she used to have HTN but is no longer after her weightloss surgery.  Chest x-ray -  ECHO - 04/09/18 Cardiac Cath -  CPAP -   ERAS Protcol - clears 1100  COVID TEST- n/a - ambulatory  Anesthesia review: n/a  -------------  SDW INSTRUCTIONS:  Your procedure is scheduled on Tuesday 8/23. Please report to [facility] Main Entrance "A" at 1130 A.M., and check in at the Admitting office. Call this number if you have problems the morning of surgery: [phone]   Remember: Do not eat after midnight the night before your surgery  You may drink clear liquids until 11:00 AM the morning of your surgery.   Clear liquids allowed are: Water, Non-Citrus Juices (without pulp), Carbonated Beverages, Clear Tea, Black Coffee Only, and Gatorade   Medications to take morning of surgery with a sip of water include: levothyroxine (SYNTHROID)  If needed: acyclovir (ZOVIRAX) albuterol (VENTOLIN HFA)  --- Please bring all inhalers with you the day of surgery.   As of today, STOP taking any Aspirin (unless otherwise instructed by your surgeon), Aleve, Naproxen, Ibuprofen, Motrin, Advil, Goody's, BC's, all herbal medications, fish oil, and all vitamins.    The Morning of Surgery Do not wear jewelry, make-up or nail polish. Do not wear lotions, powders, or perfumes, or deodorant Do not shave 48 hours prior to surgery.   Do not bring valuables to the hospital. [facility] is not responsible for any belongings or valuables.  If you are a smoker, DO NOT Smoke 24 hours prior to surgery  If you wear a CPAP at night please bring your mask the morning of surgery   Remember that you must have someone to transport you home after your surgery, and remain with you for 24 hours if you are discharged the same day.  Please bring cases for contacts, glasses, hearing aids, dentures or bridgework because it cannot be worn into surgery.    Patients discharged the day of surgery will not be allowed to drive home.   Please shower the NIGHT BEFORE/MORNING OF SURGERY (use antibacterial soap like DIAL soap if possible). Wear comfortable clothes the morning of surgery. Oral Hygiene is also important to reduce your risk of infection.  Remember - BRUSH YOUR TEETH THE MORNING OF SURGERY WITH YOUR REGULAR TOOTHPASTE  Patient denies shortness of breath, fever, cough and chest pain.

## 2020-11-07 ENCOUNTER — Ambulatory Visit (HOSPITAL_COMMUNITY)
Admission: RE | Admit: 2020-11-07 | Discharge: 2020-11-07 | Disposition: A | Discharge status: Home / Self Care | Payer: Medicare Other | Attending: General Surgery | Admitting: General Surgery

## 2020-11-07 ENCOUNTER — Ambulatory Visit (HOSPITAL_COMMUNITY): Payer: Medicare Other | Admitting: Certified Registered"

## 2020-11-07 ENCOUNTER — Encounter (HOSPITAL_COMMUNITY): Payer: Self-pay | Admitting: General Surgery

## 2020-11-07 ENCOUNTER — Encounter (HOSPITAL_COMMUNITY)
Admission: RE | Disposition: A | Discharge status: Home / Self Care | Payer: Self-pay | Source: Home / Self Care | Attending: General Surgery

## 2020-11-07 DIAGNOSIS — Z853 Personal history of malignant neoplasm of breast: Secondary | ICD-10-CM | POA: Insufficient documentation

## 2020-11-07 DIAGNOSIS — Z452 Encounter for adjustment and management of vascular access device: Secondary | ICD-10-CM | POA: Insufficient documentation

## 2020-11-07 DIAGNOSIS — Z903 Acquired absence of stomach [part of]: Secondary | ICD-10-CM | POA: Insufficient documentation

## 2020-11-07 DIAGNOSIS — C169 Malignant neoplasm of stomach, unspecified: Secondary | ICD-10-CM | POA: Insufficient documentation

## 2020-11-07 DIAGNOSIS — Z803 Family history of malignant neoplasm of breast: Secondary | ICD-10-CM | POA: Diagnosis not present

## 2020-11-07 DIAGNOSIS — Z800 Family history of malignant neoplasm of digestive organs: Secondary | ICD-10-CM | POA: Diagnosis not present

## 2020-11-07 DIAGNOSIS — Z7951 Long term (current) use of inhaled steroids: Secondary | ICD-10-CM | POA: Diagnosis not present

## 2020-11-07 DIAGNOSIS — Z79890 Hormone replacement therapy (postmenopausal): Secondary | ICD-10-CM | POA: Diagnosis not present

## 2020-11-07 DIAGNOSIS — E039 Hypothyroidism, unspecified: Secondary | ICD-10-CM | POA: Insufficient documentation

## 2020-11-07 DIAGNOSIS — Z9010 Acquired absence of unspecified breast and nipple: Secondary | ICD-10-CM | POA: Insufficient documentation

## 2020-11-07 HISTORY — PX: PORT-A-CATH REMOVAL: SHX5289

## 2020-11-07 SURGERY — REMOVAL PORT-A-CATH
Anesthesia: Monitor Anesthesia Care | Site: Chest | Laterality: Left

## 2020-11-07 MED ORDER — LIDOCAINE-EPINEPHRINE (PF) 1 %-1:200000 IJ SOLN
INTRAMUSCULAR | Status: AC
  Filled 2020-11-07: qty 30

## 2020-11-07 MED ORDER — CEFAZOLIN SODIUM-DEXTROSE 2-4 GM/100ML-% IV SOLN
2.0000 g | INTRAVENOUS | Status: AC
  Administered 2020-11-07: 2 g via INTRAVENOUS
  Filled 2020-11-07: qty 100

## 2020-11-07 MED ORDER — BUPIVACAINE HCL (PF) 0.25 % IJ SOLN
INTRAMUSCULAR | Status: AC
  Filled 2020-11-07: qty 30

## 2020-11-07 MED ORDER — BUPIVACAINE HCL (PF) 0.25 % IJ SOLN
INTRAMUSCULAR | Status: DC | PRN
  Administered 2020-11-07: 2.5 mL

## 2020-11-07 MED ORDER — CHLORHEXIDINE GLUCONATE 0.12 % MT SOLN
15.0000 mL | OROMUCOSAL | Status: DC
  Filled 2020-11-07: qty 15

## 2020-11-07 MED ORDER — MEPERIDINE HCL 25 MG/ML IJ SOLN: 6.2500 mg | INTRAMUSCULAR | Status: DC | PRN

## 2020-11-07 MED ORDER — PROMETHAZINE HCL 25 MG/ML IJ SOLN: 6.2500 mg | INTRAMUSCULAR | Status: DC | PRN

## 2020-11-07 MED ORDER — 0.9 % SODIUM CHLORIDE (POUR BTL) OPTIME
TOPICAL | Status: DC | PRN
  Administered 2020-11-07: 1000 mL

## 2020-11-07 MED ORDER — LACTATED RINGERS IV SOLN: INTRAVENOUS | Status: DC

## 2020-11-07 MED ORDER — CHLORHEXIDINE GLUCONATE CLOTH 2 % EX PADS
6.0000 | MEDICATED_PAD | Freq: Once | CUTANEOUS | Status: DC
Start: 2020-11-07 — End: 2020-11-07

## 2020-11-07 MED ORDER — MIDAZOLAM HCL 2 MG/2ML IJ SOLN: 0.5000 mg | Freq: Once | INTRAMUSCULAR | Status: DC | PRN

## 2020-11-07 MED ORDER — ACETAMINOPHEN 500 MG PO TABS
1000.0000 mg | ORAL_TABLET | ORAL | Status: AC
  Administered 2020-11-07: 1000 mg via ORAL
  Filled 2020-11-07: qty 2

## 2020-11-07 MED ORDER — LACTATED RINGERS IV SOLN: INTRAVENOUS | Status: DC | PRN

## 2020-11-07 MED ORDER — LIDOCAINE-EPINEPHRINE 1 %-1:100000 IJ SOLN
INTRAMUSCULAR | Status: DC | PRN
  Administered 2020-11-07: 2.5 mL

## 2020-11-07 MED ORDER — CHLORHEXIDINE GLUCONATE CLOTH 2 % EX PADS: 6.0000 | MEDICATED_PAD | Freq: Once | CUTANEOUS | Status: DC

## 2020-11-07 MED ORDER — FENTANYL CITRATE (PF) 100 MCG/2ML IJ SOLN: 25.0000 ug | INTRAMUSCULAR | Status: DC | PRN

## 2020-11-07 MED ORDER — OXYCODONE HCL 5 MG PO TABS: 5.0000 mg | ORAL_TABLET | Freq: Once | ORAL | Status: DC | PRN

## 2020-11-07 MED ORDER — PROPOFOL 10 MG/ML IV BOLUS
INTRAVENOUS | Status: DC | PRN
  Administered 2020-11-07: 40 mg via INTRAVENOUS

## 2020-11-07 MED ORDER — ONDANSETRON HCL 4 MG/2ML IJ SOLN
INTRAMUSCULAR | Status: DC | PRN
  Administered 2020-11-07: 4 mg via INTRAVENOUS

## 2020-11-07 MED ORDER — PROPOFOL 500 MG/50ML IV EMUL
INTRAVENOUS | Status: DC | PRN
  Administered 2020-11-07: 100 ug/kg/min via INTRAVENOUS

## 2020-11-07 MED ORDER — OXYCODONE HCL 5 MG/5ML PO SOLN: 5.0000 mg | Freq: Once | ORAL | Status: DC | PRN

## 2020-11-07 MED ORDER — CHLORHEXIDINE GLUCONATE 0.12 % MT SOLN
OROMUCOSAL | Status: AC
  Filled 2020-11-07: qty 15

## 2020-11-07 SURGICAL SUPPLY — 28 items
BAG COUNTER SPONGE SURGICOUNT (BAG) ×2 IMPLANT
CHLORAPREP W/TINT 10.5 ML (MISCELLANEOUS) ×2 IMPLANT
COVER SURGICAL LIGHT HANDLE (MISCELLANEOUS) ×2 IMPLANT
DECANTER SPIKE VIAL GLASS SM (MISCELLANEOUS) IMPLANT
DERMABOND ADVANCED (GAUZE/BANDAGES/DRESSINGS) ×1
DERMABOND ADVANCED .7 DNX12 (GAUZE/BANDAGES/DRESSINGS) ×1 IMPLANT
DRAPE LAPAROTOMY 100X72 PEDS (DRAPES) ×2 IMPLANT
ELECT CAUTERY BLADE 6.4 (BLADE) ×2 IMPLANT
ELECT REM PT RETURN 9FT ADLT (ELECTROSURGICAL) ×2
ELECTRODE REM PT RTRN 9FT ADLT (ELECTROSURGICAL) ×1 IMPLANT
GAUZE 4X4 16PLY ~~LOC~~+RFID DBL (SPONGE) IMPLANT
GLOVE SURG ENC MOIS LTX SZ6 (GLOVE) ×2 IMPLANT
GLOVE SURG UNDER LTX SZ6.5 (GLOVE) ×2 IMPLANT
GOWN STRL REUS W/ TWL LRG LVL3 (GOWN DISPOSABLE) ×1 IMPLANT
GOWN STRL REUS W/TWL 2XL LVL3 (GOWN DISPOSABLE) ×2 IMPLANT
GOWN STRL REUS W/TWL LRG LVL3 (GOWN DISPOSABLE) ×1
KIT BASIN OR (CUSTOM PROCEDURE TRAY) ×2 IMPLANT
KIT TURNOVER KIT B (KITS) ×2 IMPLANT
NEEDLE HYPO 25GX1X1/2 BEV (NEEDLE) ×2 IMPLANT
NS IRRIG 1000ML POUR BTL (IV SOLUTION) ×2 IMPLANT
PACK GENERAL/GYN (CUSTOM PROCEDURE TRAY) ×2 IMPLANT
PAD ARMBOARD 7.5X6 YLW CONV (MISCELLANEOUS) ×4 IMPLANT
SUT MON AB 4-0 PC3 18 (SUTURE) ×2 IMPLANT
SUT VIC AB 3-0 SH 27 (SUTURE) ×1
SUT VIC AB 3-0 SH 27X BRD (SUTURE) ×1 IMPLANT
SYR CONTROL 10ML LL (SYRINGE) ×2 IMPLANT
TOWEL GREEN STERILE (TOWEL DISPOSABLE) ×2 IMPLANT
TOWEL GREEN STERILE FF (TOWEL DISPOSABLE) ×2 IMPLANT

## 2020-11-07 NOTE — Anesthesia Procedure Notes (Signed)
Procedure Name: MAC Date/Time: 11/07/2020 3:50 PM Performed by: Annamary Carolin, CRNA Pre-anesthesia Checklist: Patient identified, Emergency Drugs available, Suction available and Patient being monitored Patient Re-evaluated:Patient Re-evaluated prior to induction Oxygen Delivery Method: Simple face mask Preoxygenation: Pre-oxygenation with 100% oxygen Induction Type: IV induction Dental Injury: Teeth and Oropharynx as per pre-operative assessment

## 2020-11-07 NOTE — Op Note (Signed)
  PRE-OPERATIVE DIAGNOSIS:  un-needed Port-A-Cath for gastric cancer  POST-OPERATIVE DIAGNOSIS:  Same   PROCEDURE:  Procedure(s):  REMOVAL PORT-A-CATH  SURGEON:  Surgeon(s):  [name], MD  ANESTHESIA:   MAC + local  ASA: 3  EBL:   Minimal  SPECIMEN:  None  Complications : none known  Procedure:   Pt was  identified in the holding area and taken to the operating room where she was placed supine on the operating room table.  MAC anesthesia was induced.  The left upper chest was prepped and draped.  The prior incision was anesthetized with local anesthetic.  The incision was opened with a #15 blade.  The subcutaneous tissue was divided with the cautery.  The port was identified and the capsule opened.  The four 2-0 prolene sutures were removed.  The port was then removed and pressure held on the tract.  The catheter appeared intact without evidence of breakage, length was 22 cm.  The wound was inspected for hemostasis, which was achieved with cautery.  The wound was closed with 3-0 vicryl deep dermal interrupted sutures and 4-0 Monocryl running subcuticular suture.  The wound was cleaned, dried, and dressed with dermabond.  The patient was awakened from anesthesia and taken to the PACU in stable condition.  Needle, sponge, and instrument counts are correct.

## 2020-11-07 NOTE — Anesthesia Preprocedure Evaluation (Addendum)
Anesthesia Evaluation  Patient identified by MRN, date of birth, ID band Patient awake    Reviewed: Allergy & Precautions, NPO status , Patient's Chart, lab work & pertinent test results  History of Anesthesia Complications Negative for: history of anesthetic complications  Airway Mallampati: II  TM Distance: >3 FB Neck ROM: Full    Dental  (+) Dental Advisory Given, Teeth Intact   Pulmonary asthma , COPD,  COPD inhaler, former smoker,    breath sounds clear to auscultation       Cardiovascular hypertension (no longer requires BP meds),  Rhythm:Regular Rate:Normal  '20 ECHO: EF 60-65%, normal LVF, mild TR   Neuro/Psych negative neurological ROS  negative psych ROS   GI/Hepatic Neg liver ROS, PUD, GERD  Controlled,Gastric cancer H/o bariatric surgery   Endo/Other  Hypothyroidism   Renal/GU negative Renal ROS     Musculoskeletal   Abdominal   Peds  Hematology   Anesthesia Other Findings Breast cancer  Reproductive/Obstetrics                            Anesthesia Physical Anesthesia Plan  ASA: 3  Anesthesia Plan: MAC   Post-op Pain Management:    Induction:   PONV Risk Score and Plan: 2 and Ondansetron and Treatment may vary due to age or medical condition  Airway Management Planned: Natural Airway and Simple Face Mask  Additional Equipment: None  Intra-op Plan:   Post-operative Plan:   Informed Consent: I have reviewed the patients History and Physical, chart, labs and discussed the procedure including the risks, benefits and alternatives for the proposed anesthesia with the patient or authorized representative who has indicated his/her understanding and acceptance.     Dental advisory given  Plan Discussed with: CRNA and Surgeon  Anesthesia Plan Comments:        Anesthesia Quick Evaluation

## 2020-11-07 NOTE — Anesthesia Postprocedure Evaluation (Signed)
Anesthesia Post Note  Patient: Mariah Kemp  Procedure(s) Performed: PORT REMOVAL (Left: Chest)     Patient location during evaluation: PACU Anesthesia Type: MAC Level of consciousness: awake and alert, patient cooperative and oriented Pain management: pain level controlled Vital Signs Assessment: post-procedure vital signs reviewed and stable Respiratory status: spontaneous breathing, nonlabored ventilation and respiratory function stable Cardiovascular status: blood pressure returned to baseline and stable Postop Assessment: no apparent nausea or vomiting Anesthetic complications: no   No notable events documented.  Last Vitals:  Vitals:   11/07/20 1615 11/07/20 1630  BP: (!) 142/83 138/79  Pulse: 73 64  Resp: 13 14  Temp:    SpO2: 100% 99%    Last Pain:  Vitals:   11/07/20 1630  TempSrc:   PainSc: 0-No pain                 Valyn Latchford,E. Marisol Glazer

## 2020-11-07 NOTE — Transfer of Care (Signed)
Immediate Anesthesia Transfer of Care Note  Patient: Terriana Barreras  Procedure(s) Performed: PORT REMOVAL (Left: Chest)  Patient Location: PACU  Anesthesia Type:MAC  Level of Consciousness: awake, alert  and oriented  Airway & Oxygen Therapy: Patient Spontanous Breathing  Post-op Assessment: Report given to RN, Post -op Vital signs reviewed and stable and Patient moving all extremities X 4  Post vital signs: Reviewed and stable  Last Vitals:  Vitals Value Taken Time  BP 123/73 11/07/20 1612  Temp    Pulse 72 11/07/20 1613  Resp 15 11/07/20 1613  SpO2 100 % 11/07/20 1613  Vitals shown include unvalidated device data.  Last Pain:  Vitals:   11/07/20 1147  TempSrc:   PainSc: 0-No pain      Patients Stated Pain Goal: 0 (02/89/02 2840)  Complications: No notable events documented.

## 2020-11-07 NOTE — Interval H&P Note (Signed)
History and Physical Interval Note:  11/07/2020 2:55 PM  Mariah Kemp Guardian  has presented today for surgery, with the diagnosis of HISTORY OF GASTRIC CANCER.  The various methods of treatment have been discussed with the patient and family. After consideration of risks, benefits and other options for treatment, the patient has consented to  Procedure(s): PORT REMOVAL (N/A) as a surgical intervention.  The patient's history has been reviewed, patient examined, no change in status, stable for surgery.  I have reviewed the patient's chart and labs.  Questions were answered to the patient's satisfaction.     [name]

## 2020-11-07 NOTE — Discharge Instructions (Signed)
[place] Office Phone Number [phone]   POST OP INSTRUCTIONS  Always review your discharge instruction sheet given to you by the facility where your surgery was performed.  IF YOU HAVE DISABILITY OR FAMILY LEAVE FORMS, YOU MUST BRING THEM TO THE OFFICE FOR PROCESSING.  DO NOT GIVE THEM TO YOUR DOCTOR.  A prescription for pain medication may be given to you upon discharge.  Take your pain medication as prescribed, if needed.  If narcotic pain medicine is not needed, then you may take acetaminophen (Tylenol) or ibuprofen (Advil) as needed. Take your usually prescribed medications unless otherwise directed If you need a refill on your pain medication, please contact your pharmacy.  They will contact our office to request authorization.  Prescriptions will not be filled after 5pm or on week-ends. You should eat very light the first 24 hours after surgery, such as soup, crackers, pudding, etc.  Resume your normal diet the day after surgery It is common to experience some constipation if taking pain medication after surgery.  Increasing fluid intake and taking a stool softener will usually help or prevent this problem from occurring.  A mild laxative (Milk of Magnesia or Miralax) should be taken according to package directions if there are no bowel movements after 48 hours. You may shower in 48 hours.  The surgical glue will flake off in 2-3 weeks.   ACTIVITIES:  No strenuous activity or heavy lifting for 1 week.   You may drive when you no longer are taking prescription pain medication, you can comfortably wear a seatbelt, and you can safely maneuver your car and apply brakes. RETURN TO WORK:  __________1-7 days_______________ Dennis Bast should see your doctor in the office for a follow-up appointment approximately three-four weeks after your surgery.    WHEN TO CALL YOUR DOCTOR: Fever over 101.0 Nausea and/or vomiting. Extreme swelling or bruising. Continued bleeding from  incision. Increased pain, redness, or drainage from the incision.  The clinic staff is available to answer your questions during regular business hours.  Please don't hesitate to call and ask to speak to one of the nurses for clinical concerns.  If you have a medical emergency, go to the nearest emergency room or call 911.  A surgeon from [facility] is always on call at the hospital.  For further questions, please visit [email or web address]

## 2020-11-08 ENCOUNTER — Encounter (HOSPITAL_COMMUNITY): Payer: Self-pay | Admitting: General Surgery

## 2020-11-17 ENCOUNTER — Other Ambulatory Visit: Payer: Self-pay | Admitting: Osteopathic Medicine

## 2020-11-17 ENCOUNTER — Encounter: Payer: Self-pay | Admitting: Osteopathic Medicine

## 2020-11-29 ENCOUNTER — Other Ambulatory Visit: Payer: Self-pay

## 2020-11-29 ENCOUNTER — Ambulatory Visit (INDEPENDENT_AMBULATORY_CARE_PROVIDER_SITE_OTHER): Payer: Medicare Other | Admitting: Internal Medicine

## 2020-11-29 ENCOUNTER — Encounter: Payer: Self-pay | Admitting: Internal Medicine

## 2020-11-29 VITALS — BP 102/74 | HR 79 | Ht 60.0 in | Wt 117.6 lb

## 2020-11-29 DIAGNOSIS — E039 Hypothyroidism, unspecified: Secondary | ICD-10-CM | POA: Diagnosis not present

## 2020-11-29 LAB — TSH: TSH: 0.03 u[IU]/mL — ABNORMAL LOW (ref 0.35–5.50)

## 2020-11-29 NOTE — Progress Notes (Signed)
Name: Mariah Kemp  MRN/ DOB: [id number], 1960/05/14    Age/ Sex: 60 y.o., female     PCP: Emeterio Reeve, DO   Reason for Endocrinology Evaluation: Hypothyroidism     Initial Endocrinology Clinic Visit: 07/19/2020    PATIENT IDENTIFIER: Mariah Kemp is a 60 y.o., female with a past medical history of Hx of breast Ca ( S/P right mastectomy ) , Hx of gastric cancer HTn and hypothyroidism. She has followed with [facility] since 07/19/2020 for consultative assistance with management of her Hypothyroidism.   HISTORICAL SUMMARY:  She has been diagnosed with gastric cancer in 2020. S/P chemo from 1-07/2018 followed by total gastrectomy 09/2018.  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      She has been diagnosed with Hypothyroidism ~ 30 yrs ago . She has been on LT-4 for years.    No FH of thyroid disease  SUBJECTIVE:    Today (11/29/2020):  Mariah Kemp is here for hypothyroidism  Pt has been noted with weight loss  Denies hot flashes , she is cold natured   Denies loose stools or diarrhea  Denies palpitations   Recent removal of the portal   Levothyroxine 175 mcg daily except Sundays     HISTORY:  Past Medical History:  Past Medical History:  Diagnosis Date   Anemia    Asthma    Blood transfusion without reported diagnosis    Breast cancer ([place])    Dyspnea    over exertion   Hypertension    Hypothyroidism    Stomach cancer [facility])    Past Surgical History:  Past Surgical History:  Procedure Laterality Date   ABDOMINAL HYSTERECTOMY  1996   BARIATRIC SURGERY     stomach removal   BILATERAL SALPINGOOPHORECTOMY  2007   BIOPSY  03/29/2018   Procedure: BIOPSY;  Surgeon: Wilford Corner, MD;  Location: [place];  Service: Endoscopy;;   CARPAL TUNNEL RELEASE     COLONOSCOPY N/A 03/29/2018   Procedure: COLONOSCOPY;  Surgeon: Wilford Corner, MD;  Location: [place];  Service: Endoscopy;  Laterality: N/A;   COLONOSCOPY      ESOPHAGOGASTRODUODENOSCOPY N/A 03/29/2018   Procedure: ESOPHAGOGASTRODUODENOSCOPY (EGD);  Surgeon: Wilford Corner, MD;  Location: [place];  Service: Endoscopy;  Laterality: N/A;   GASTRECTOMY N/A 10/02/2018   Procedure: TOTAL GASTRECTOMY;  Surgeon: [name], MD;  Location: [place];  Service: General;  Laterality: N/A;   GASTROSTOMY N/A 10/02/2018   Procedure: FEEDING TUBE PLACEMENT;  Surgeon: [name], MD;  Location: [place];  Service: General;  Laterality: N/A;   LAPAROSCOPY N/A 04/13/2018   Procedure: LAPAROSCOPY DIAGNOSTIC;  Surgeon: [name], MD;  Location: [facility];  Service: General;  Laterality: N/A;   LAPAROSCOPY N/A 10/02/2018   Procedure: LAPAROSCOPY DIAGNOSTIC;  Surgeon: [name], MD;  Location: [place];  Service: General;  Laterality: N/A;   MASTECTOMY Right 2007   PORT-A-CATH REMOVAL Left 11/07/2020   Procedure: PORT REMOVAL;  Surgeon: [name], MD;  Location: [place];  Service: General;  Laterality: Left;   PORTACATH PLACEMENT Left 04/13/2018   Procedure: [place];  Surgeon: [name], MD;  Location: [facility];  Service: General;  Laterality: Left;  subclavian   UPPER GASTROINTESTINAL ENDOSCOPY     Social History:  reports that she quit smoking about 26 years ago. Her smoking use included cigarettes. She has a 7.50 pack-year smoking history. She has never used smokeless tobacco. She reports that she does not drink alcohol and does  not use drugs. Family History:  Family History  Problem Relation Age of Onset   Liver cancer Father    Cancer Paternal Uncle        unknown type cancer   Cirrhosis Mother    Colon cancer Neg Hx    Esophageal cancer Neg Hx    Rectal cancer Neg Hx    Stomach cancer Neg Hx      HOME MEDICATIONS: Allergies as of 11/29/2020   No Known Allergies      Medication List        Accurate as of November 29, 2020  1:14 PM. If you have any questions, ask your nurse or doctor.          STOP  taking these medications    lidocaine-prilocaine cream Commonly known as: EMLA Stopped by: Dorita Sciara, MD       TAKE these medications    acyclovir 400 MG tablet Commonly known as: Zovirax Take 1 tablet (400 mg total) by mouth 3 (three) times daily. For five days. As needed for cold sore. What changed:  when to take this reasons to take this additional instructions   Advair HFA 115-21 MCG/ACT inhaler Generic drug: fluticasone-salmeterol Inhale 2 puffs into the lungs 2 (two) times daily. What changed:  when to take this reasons to take this   albuterol 108 (90 Base) MCG/ACT inhaler Commonly known as: VENTOLIN HFA Inhale 1-2 puffs into the lungs every 4 (four) hours as needed for wheezing or shortness of breath.   baclofen 10 MG tablet Commonly known as: LIORESAL Take 1 tablet (10 mg total) by mouth 4 (four) times daily as needed for muscle spasms.   calcium carbonate 500 MG chewable tablet Commonly known as: TUMS - dosed in mg elemental calcium Chew 1 tablet (200 mg of elemental calcium total) by mouth 3 (three) times daily.   cyanocobalamin 1000 MCG/ML injection Commonly known as: (VITAMIN B-12) Inject 1 mL (1,000 mcg total) into the muscle once a week. What changed: when to take this   levothyroxine 175 MCG tablet Commonly known as: SYNTHROID Take 1 tablet (175 mcg total) by mouth daily. What changed: how much to take   loperamide 2 MG capsule Commonly known as: IMODIUM Take 1 capsule (2 mg total) by mouth 2 (two) times daily as needed for diarrhea or loose stools.   Needles & Syringes Misc 25 ga x 1 " IM needle/syringe   Potassium 99 MG Tabs Take 99 mg by mouth daily.          OBJECTIVE:   PHYSICAL EXAM: VS: BP 102/74 (BP Location: Left Arm, Patient Position: Sitting, Cuff Size: Small)   Pulse 79   Ht 5' (1.524 m)   Wt 117 lb 9.6 oz (53.3 kg)   SpO2 99%   BMI 22.97 kg/m    EXAM: General: Pt appears well and is in NAD  Neck:  General: Supple without adenopathy. Thyroid: Thyroid size normal.  No goiter or nodules appreciated.  Lungs: Clear with good BS bilat with no rales, rhonchi, or wheezes  Heart: Auscultation: RRR.  Abdomen: Normoactive bowel sounds, soft, nontender, without masses or organomegaly palpable  Extremities:  BL LE: No pretibial edema normal ROM and strength.  Mental Status: Judgment, insight: Intact Memory: Intact for recent and remote events Mood and affect: No depression, anxiety, or agitation     DATA REVIEWED: Results for SIMRAT, GRGAS [facility] (MRN [id number]) as of 11/30/2020 12:16  Ref. Range 11/29/2020 13:58  TSH Latest Ref  Range: 0.35 - 5.50 uIU/mL 0.03 (L)   ASSESSMENT / PLAN / RECOMMENDATIONS:   Hypothyroidism:   - Pt is clinically euthyorid  - No local neck symptoms  - She is compliant with proper LT-4 intake  -TSH continues to be low, we will reduce the dose  Medications   Stop levothyroxine 175 mcg Start levothyroxine 137 MCG daily  Follow-up in 4 months Labs in 8 weeks Signed electronically by: Mack Guise, MD  [facility]  [facility] [address] [place], [place] [address] Phone: [phone] FAX: [phone]      CC: Florinda Marker [id number] [facility] [address] [place] [address] Phone: [phone]  Fax: [phone]   Return to Endocrinology clinic as below: Future Appointments  Date Time Provider [place]  11/29/2020  1:20 PM Naheem Mosco, Melanie Crazier, MD [facility] None  01/19/2021  8:45 AM [facility] None  01/19/2021  9:20 AM [name], MD [facility] None

## 2020-11-29 NOTE — Patient Instructions (Signed)

## 2020-11-30 LAB — T4, FREE: Free T4: 1.47 ng/dL (ref 0.60–1.60)

## 2020-11-30 MED ORDER — LEVOTHYROXINE SODIUM 137 MCG PO CAPS: 137.0000 ug | ORAL_CAPSULE | Freq: Every day | ORAL | 1 refills | Status: DC

## 2021-01-19 ENCOUNTER — Other Ambulatory Visit: Payer: Self-pay

## 2021-01-19 ENCOUNTER — Encounter: Payer: Self-pay | Admitting: Hematology

## 2021-01-19 ENCOUNTER — Other Ambulatory Visit: Payer: 59

## 2021-01-19 ENCOUNTER — Inpatient Hospital Stay: Payer: Medicare Other

## 2021-01-19 ENCOUNTER — Inpatient Hospital Stay: Payer: Medicare Other | Attending: Hematology | Admitting: Hematology

## 2021-01-19 VITALS — BP 126/78 | HR 80 | Temp 97.5°F | Resp 18 | Ht 60.0 in | Wt 118.8 lb

## 2021-01-19 DIAGNOSIS — E538 Deficiency of other specified B group vitamins: Secondary | ICD-10-CM | POA: Insufficient documentation

## 2021-01-19 DIAGNOSIS — Z9221 Personal history of antineoplastic chemotherapy: Secondary | ICD-10-CM | POA: Insufficient documentation

## 2021-01-19 DIAGNOSIS — E039 Hypothyroidism, unspecified: Secondary | ICD-10-CM | POA: Insufficient documentation

## 2021-01-19 DIAGNOSIS — C162 Malignant neoplasm of body of stomach: Secondary | ICD-10-CM | POA: Insufficient documentation

## 2021-01-19 DIAGNOSIS — D649 Anemia, unspecified: Secondary | ICD-10-CM | POA: Insufficient documentation

## 2021-01-19 DIAGNOSIS — R918 Other nonspecific abnormal finding of lung field: Secondary | ICD-10-CM | POA: Insufficient documentation

## 2021-01-19 DIAGNOSIS — I10 Essential (primary) hypertension: Secondary | ICD-10-CM | POA: Insufficient documentation

## 2021-01-19 DIAGNOSIS — Z903 Acquired absence of stomach [part of]: Secondary | ICD-10-CM | POA: Diagnosis not present

## 2021-01-19 DIAGNOSIS — K219 Gastro-esophageal reflux disease without esophagitis: Secondary | ICD-10-CM | POA: Diagnosis not present

## 2021-01-19 DIAGNOSIS — D500 Iron deficiency anemia secondary to blood loss (chronic): Secondary | ICD-10-CM

## 2021-01-19 DIAGNOSIS — C169 Malignant neoplasm of stomach, unspecified: Secondary | ICD-10-CM

## 2021-01-19 DIAGNOSIS — Z853 Personal history of malignant neoplasm of breast: Secondary | ICD-10-CM | POA: Diagnosis not present

## 2021-01-19 LAB — CBC WITH DIFFERENTIAL (CANCER CENTER ONLY)
Abs Immature Granulocytes: 0.02 10*3/uL (ref 0.00–0.07)
Basophils Absolute: 0.1 10*3/uL (ref 0.0–0.1)
Basophils Relative: 1 %
Eosinophils Absolute: 0.6 10*3/uL — ABNORMAL HIGH (ref 0.0–0.5)
Eosinophils Relative: 9 %
HCT: 36.9 % (ref 36.0–46.0)
Hemoglobin: 13 g/dL (ref 12.0–15.0)
Immature Granulocytes: 0 %
Lymphocytes Relative: 32 %
Lymphs Abs: 2 10*3/uL (ref 0.7–4.0)
MCH: 32.6 pg (ref 26.0–34.0)
MCHC: 35.2 g/dL (ref 30.0–36.0)
MCV: 92.5 fL (ref 80.0–100.0)
Monocytes Absolute: 0.5 10*3/uL (ref 0.1–1.0)
Monocytes Relative: 7 %
Neutro Abs: 3.1 10*3/uL (ref 1.7–7.7)
Neutrophils Relative %: 51 %
Platelet Count: 219 10*3/uL (ref 150–400)
RBC: 3.99 MIL/uL (ref 3.87–5.11)
RDW: 12.5 % (ref 11.5–15.5)
WBC Count: 6.2 10*3/uL (ref 4.0–10.5)
nRBC: 0 % (ref 0.0–0.2)

## 2021-01-19 LAB — CMP (CANCER CENTER ONLY)
ALT: 26 U/L (ref 0–44)
AST: 25 U/L (ref 15–41)
Albumin: 4 g/dL (ref 3.5–5.0)
Alkaline Phosphatase: 100 U/L (ref 38–126)
Anion gap: 10 (ref 5–15)
BUN: 14 mg/dL (ref 6–20)
CO2: 27 mmol/L (ref 22–32)
Calcium: 9.8 mg/dL (ref 8.9–10.3)
Chloride: 104 mmol/L (ref 98–111)
Creatinine: 0.62 mg/dL (ref 0.44–1.00)
GFR, Estimated: >60 mL/min (ref >60)
Glucose, Bld: 89 mg/dL (ref 70–99)
Potassium: 4 mmol/L (ref 3.5–5.1)
Sodium: 141 mmol/L (ref 135–145)
Total Bilirubin: 0.3 mg/dL (ref 0.3–1.2)
Total Protein: 8.2 g/dL — ABNORMAL HIGH (ref 6.5–8.1)

## 2021-01-19 LAB — VITAMIN B12: Vitamin B-12: 439 pg/mL (ref 180–914)

## 2021-01-19 LAB — FERRITIN: Ferritin: 1125 ng/mL — ABNORMAL HIGH (ref 11–307)

## 2021-01-19 MED ORDER — CYANOCOBALAMIN 1000 MCG/ML IJ SOLN
1000.0000 ug | INTRAMUSCULAR | 6 refills | Status: DC
Start: 2021-01-19 — End: 2021-02-12

## 2021-01-19 NOTE — Progress Notes (Signed)
Mariah Kemp   [phone] [phone]   Clinic Follow up Note   Patient Care Team: Emeterio Reeve, DO as PCP - General (Osteopathic Medicine) [name], MD as Consulting Physician (General Surgery) [name], MD as Consulting Physician (Hematology) Alla Feeling, NP as Nurse Practitioner (Nurse Practitioner)  Date of Service:  01/19/2021  CHIEF COMPLAINT: f/u of gastric cancer  CURRENT THERAPY:  Gastric Cancer Surveillance  B12 injection weekly at home.    ASSESSMENT & PLAN:  Mariah Kemp is a 60 y.o. female with   1. 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 Given her locally advanced disease, she completed 8 cycles of neo-adjuvant chemo FLOT4.  -She underwent total Gastrectomy and lymph node dissection and had feeding tube placed on 10/02/18. She had complete pathological response with all nodes negative.  -Given her pCR, I do not recommend more adjuvant radiation or chemotherapy. She is currently on surveillance.  -Her CT CAP from 07/17/20 shows No new or progressive findings to suggest recurrent or metastatic disease in the chest, abdomen, or pelvis. She does have Bilateral pulmonary nodules, similar to prior and these are probably infectious/inflammatory.  -port removed 11/07/20, healing well -she is clinically doing well. Labs reviewed, CBC overall WNL, other labs pending. Physical exam was unremarkable. She is close to 3 years out, risk of recurrence has decreased now  -f/u in 6 months after surveillance CT scan.   2. Weight loss s/p gastric surgery, Acid reflux -After months of major weight loss then fluctuating weight, her weight has been stable recently. Will continue to manage diet closely -She has Acid reflux and gas s/p gastric surgery. She will continue Tums as needed.    3. Anemia, B12 deficiency S/p gastric surgery  -Given 3u blood transfusion  on 04/01/18 and IV Feraheme in 03/2018 and  2-05/2019. -Started monthly B12 injections in 01/2019. Switched to home injections after 09/03/19, now every week.  -iron panel pending, Hg now WNL at 13 today (01/19/21)   4. H/o stage 3 right breast cancer in 2007, Genetic Testing was not pursued  -Treated with right mastectomy, reconstruction with 3 LN removed. She underwent chemotherapy for 5 months, likely AC-T. She completed Tamoxifen for at least 5 years.    5. HTN, hypothyroidism, Health maintenance  -Continue to follow-up with PCP -Her 03/2018 Colonoscopy was overall normal and benign. Recommended to repeat 10 years later.  -She is overdue for mammogram, last 01/2019.     PLAN: -continue B12 weekly at home -F/u in 6 months, with lab and restaging CT several days before. She will f/u with PCP in the interim    No problem-specific Assessment & Plan notes found for this encounter.   SUMMARY OF ONCOLOGIC HISTORY: Oncology History Overview Note  Cancer Staging Gastric cancer [facility]) Staging form: Stomach, AJCC 8th Edition - Clinical stage from 03/29/2018: Stage IVA (cT4b, cN1, cM0) - Signed by [name], MD on 04/03/2018 - Pathologic stage from 10/02/2018: ypT0, pN0, cM0 - Signed by Alla Feeling, NP on 01/19/2019     Gastric cancer ([place])  03/19/2018 Procedure   Upper Endoscopy by Dr. Michail Sermon 03/29/18  IMPRESSION - Normal esophagus. - Z-line regular, 38 cm from the incisors. - Non-obstructing oozing gastric ulcer with pigmented material. Biopsied. - Normal examined duodenum. - Acute gastritis.   03/29/2018 Procedure   Colonoscopy by Dr. Michail Sermon 03/29/18 IMPRESSION - Preparation of the colon was fair. - Internal hemorrhoids. -  The examined portion of the ileum was normal. - No specimens collected.   03/29/2018 Initial Biopsy   Diagnosis 03/29/18 Stomach, biopsy, Proximal - ADENOCARCINOMA. - GOBLET CELL METAPLASIA. - SEE COMMENT. Microscopic Comment A Warthin Starry stain is  negative for the presence of Helicobacter pylori organisms. Dr Vic Ripper has reviewed the case and concurs with this interpretation. Dr Michail Sermon was paged on 03/31/2018. (JBK:ecj 03/31/2018)   03/29/2018 Cancer Staging   Staging form: Stomach, AJCC 8th Edition - Clinical stage from 03/29/2018: Stage IVA (cT4b, cN1, cM0) - Signed by [name], MD on 04/03/2018    04/02/2018 Initial Diagnosis   Gastric cancer ([place])   04/02/2018 Imaging   CT CAP W Contrast 04/02/18 IMPRESSION: 1. Focal thickening lateral wall proximal stomach with protrusion of low-attenuation soft tissue beyond the expected confines of the gastric wall into the splenic hilum. Imaging features highly concerning for transmural tumor extension into the splenic hilum. Several small nodules in this region are likely lymph nodes, concerning for metastatic disease. 2. Scattered bilateral pulmonary nodules measuring up to 5 mm. Nonspecific, but close attention on follow-up recommended as metastatic disease not excluded. 3. Small lymph nodes in the gastrohepatic ligament, but there is no gastrohepatic or hepato duodenal ligament lymphadenopathy. No evidence for liver metastases. 4.  Aortic Atherosclerois (ICD10-170.0   04/15/2018 - 07/22/2018 Chemotherapy   FLOT4 every 2 weeks starting 04/15/18. She completed 8 cycles on 07/22/18.   08/21/2018 Imaging   CT CAP W Contrast  IMPRESSION: 1. No evidence of residual gastric mass. No abdominopelvic adenopathy. 2. Similar bilateral pulmonary nodules, indeterminate. 3. Development of upper lung and peribronchovascular predominant pulmonary opacities. Differential considerations include drug toxicity or atypical infection. Lymphangitic tumor spread felt less likely but cannot be excluded. Consider chest CT follow-up at 3-6 months. 4.  Aortic Atherosclerosis (ICD10-I70.0).     10/02/2018 Surgery   LAPAROSCOPY DIAGNOSTIC, TOTAL GASTRECTOMY, FEEDING TUBE PLACEMENT by Dr. Barry Dienes  10/02/18     10/02/2018 Pathology Results   Diagnosis 10/02/18 Stomach, resection for tumor, Total - CHRONIC GASTRITIS WITH GOBLET CELL METAPLASIA AND ULCERATION. - ADENOCARCINOMA IS NOT IDENTIFIED. - THERE IS NO EVIDENCE OF CARCINOMA IN 34 OF 34 LYMPH NODES (0/34). - SEE ONCOLOGY TABLE BELOW.   10/02/2018 Cancer Staging   Staging form: Stomach, AJCC 8th Edition - Pathologic stage from 10/02/2018: ypT0, pN0, cM0 - Signed by Alla Feeling, NP on 01/19/2019    01/20/2019 Survivorship   Per Cira Rue, NP    03/10/2019 Imaging   CT CAP W contrast IMPRESSION: 1. Interval Roux-en-Y esophagojejunostomy with no the definite findings of metastatic disease in the chest, abdomen, or pelvis. 2. Multiple irregular bilateral pulmonary nodules are smaller than on the previous study. This may be infectious/inflammatory etiology but continued close attention on follow-up recommended. 3. Central mesenteric lymph nodes measure upper normal for size and are slightly increased since the preoperative exam. Attention on follow-up recommended. 4.  Aortic Atherosclerois (ICD10-170.0)   08/31/2019 Imaging   CT CAP w contrast  IMPRESSION: 1. Stable exam. No new or progressive findings in the chest, abdomen, or pelvis. 2. Numerous bilateral irregular nodular opacities in both lungs are stable in the interval. No new suspicious pulmonary nodule or mass. 3. Stable borderline enlarged portocaval lymph node. 4. Aortic Atherosclerosis (ICD10-I70.0).   07/17/2020 Imaging   CT CAP  IMPRESSION: 1. No substantial interval change in exam. No new or progressive findings to suggest recurrent or metastatic disease in the chest, abdomen, or pelvis. 2. Bilateral pulmonary nodules, similar  to prior. Given upper lung predominance and interval stability, these are probably infectious/inflammatory. Continued attention on follow-up recommended. 3. Aortic Atherosclerosis (ICD10-I70.0).      INTERVAL HISTORY:  Nasya Vincent is here for a follow up of gastric cancer. She was last seen by me on 07/19/20. She presents to the clinic alone. She reports doing well overall, continuing to improve since surgery.   All other systems were reviewed with the patient and are negative.  MEDICAL HISTORY:  Past Medical History:  Diagnosis Date   Anemia    Asthma    Blood transfusion without reported diagnosis    Breast cancer ([place])    Dyspnea    over exertion   Hypertension    Hypothyroidism    Stomach cancer ([place])     SURGICAL HISTORY: Past Surgical History:  Procedure Laterality Date   ABDOMINAL HYSTERECTOMY  1996   BARIATRIC SURGERY     stomach removal   BILATERAL SALPINGOOPHORECTOMY  2007   BIOPSY  03/29/2018   Procedure: BIOPSY;  Surgeon: Wilford Corner, MD;  Location: [place];  Service: Endoscopy;;   CARPAL TUNNEL RELEASE     COLONOSCOPY N/A 03/29/2018   Procedure: COLONOSCOPY;  Surgeon: Wilford Corner, MD;  Location: [place];  Service: Endoscopy;  Laterality: N/A;   COLONOSCOPY     ESOPHAGOGASTRODUODENOSCOPY N/A 03/29/2018   Procedure: ESOPHAGOGASTRODUODENOSCOPY (EGD);  Surgeon: Wilford Corner, MD;  Location: [place];  Service: Endoscopy;  Laterality: N/A;   GASTRECTOMY N/A 10/02/2018   Procedure: TOTAL GASTRECTOMY;  Surgeon: [name], MD;  Location: [place];  Service: General;  Laterality: N/A;   GASTROSTOMY N/A 10/02/2018   Procedure: FEEDING TUBE PLACEMENT;  Surgeon: [name], MD;  Location: [place];  Service: General;  Laterality: N/A;   LAPAROSCOPY N/A 04/13/2018   Procedure: LAPAROSCOPY DIAGNOSTIC;  Surgeon: [name], MD;  [facility];  Service: General;  Laterality: N/A;   LAPAROSCOPY N/A 10/02/2018   Procedure: LAPAROSCOPY DIAGNOSTIC;  Surgeon: [name], MD;  Location: [place];  Service: General;  Laterality: N/A;   MASTECTOMY Right 2007   PORT-A-CATH REMOVAL Left 11/07/2020   Procedure: PORT REMOVAL;  Surgeon: [name], MD;  Location: [place];   Service: General;  Laterality: Left;   PORTACATH PLACEMENT Left 04/13/2018   Procedure: [place];  Surgeon: [name], MD;  [facility];  Service: General;  Laterality: Left;  subclavian   UPPER GASTROINTESTINAL ENDOSCOPY      I have reviewed the social history and family history with the patient and they are unchanged from previous note.  ALLERGIES:  has No Known Allergies.  MEDICATIONS:  Current Outpatient Medications  Medication Sig Dispense Refill   acyclovir (ZOVIRAX) 400 MG tablet Take 1 tablet (400 mg total) by mouth 3 (three) times daily. For five days. As needed for cold sore. (Patient taking differently: Take 400 mg by mouth 3 (three) times daily as needed (cold sores.).) 15 tablet 0   albuterol (VENTOLIN HFA) 108 (90 Base) MCG/ACT inhaler Inhale 1-2 puffs into the lungs every 4 (four) hours as needed for wheezing or shortness of breath. 18 g 11   baclofen (LIORESAL) 10 MG tablet Take 1 tablet (10 mg total) by mouth 4 (four) times daily as needed for muscle spasms. 30 each 0   calcium carbonate (TUMS - DOSED IN MG ELEMENTAL CALCIUM) 500 MG chewable tablet Chew 1 tablet (200 mg of elemental calcium total) by mouth 3 (three) times daily. 90 tablet 11   cyanocobalamin (,  VITAMIN B-12,) 1000 MCG/ML injection Inject 1 mL (1,000 mcg total) into the muscle once a week. (Patient taking differently: Inject 1,000 mcg into the muscle every 14 (fourteen) days.) 4 mL 6   fluticasone-salmeterol (ADVAIR HFA) 115-21 MCG/ACT inhaler Inhale 2 puffs into the lungs 2 (two) times daily. (Patient taking differently: Inhale 2 puffs into the lungs daily as needed (Wheezy).) 3 Inhaler 99   Levothyroxine Sodium 137 MCG CAPS Take 1 capsule (137 mcg total) by mouth daily before breakfast. 90 capsule 1   loperamide (IMODIUM) 2 MG capsule Take 1 capsule (2 mg total) by mouth 2 (two) times daily as needed for diarrhea or loose stools. 30 capsule 0   Needles & Syringes MISC 25 ga x 1  " IM needle/syringe 6 each prn   Potassium 99 MG TABS Take 99 mg by mouth daily.     No current facility-administered medications for this visit.    PHYSICAL EXAMINATION: ECOG PERFORMANCE STATUS: 0 - Asymptomatic  Vitals:   01/19/21 0855  BP: 126/78  Pulse: 80  Resp: 18  Temp: (!) 97.5 F (36.4 C)  SpO2: 99%   Wt Readings from Last 3 Encounters:  01/19/21 118 lb 12.8 oz (53.9 kg)  11/29/20 117 lb 9.6 oz (53.3 kg)  11/07/20 117 lb 15.1 oz (53.5 kg)     GENERAL:alert, no distress and comfortable SKIN: skin color, texture, turgor are normal, no rashes or significant lesions EYES: normal, Conjunctiva are pink and non-injected, sclera clear  LUNGS: clear to auscultation and percussion with normal breathing effort HEART: regular rate & rhythm and no murmurs and no lower extremity edema ABDOMEN:abdomen soft, non-tender and normal bowel sounds Musculoskeletal:no cyanosis of digits and no clubbing  NEURO: alert & oriented x 3 with fluent speech, no focal motor/sensory deficits  LABORATORY DATA:  I have reviewed the data as listed CBC Latest Ref Rng & Units 01/19/2021 08/30/2020 07/17/2020  WBC 4.0 - 10.5 K/uL 6.2 4.0 5.3  Hemoglobin 12.0 - 15.0 g/dL 13.0 9.8(L) 11.1(L)  Hematocrit 36.0 - 46.0 % 36.9 28.5(L) 31.6(L)  Platelets 150 - 400 K/uL 219 134(L) 197     CMP Latest Ref Rng & Units 08/30/2020 07/17/2020 06/02/2020  Glucose 70 - 99 mg/dL 168(H) 78 81  BUN 6 - 20 mg/dL 12 11 11   Creatinine 0.44 - 1.00 mg/dL 0.56 0.54 0.53  Sodium 135 - 145 mmol/L 144 139 138  Potassium 3.5 - 5.1 mmol/L 3.2(L) 3.8 3.7  Chloride 98 - 111 mmol/L 110 103 108  CO2 22 - 32 mmol/L 24 27 24   Calcium 8.9 - 10.3 mg/dL 9.0 9.1 9.1  Total Protein 6.5 - 8.1 g/dL 6.2(L) 7.0 6.9  Total Bilirubin 0.3 - 1.2 mg/dL 0.3 0.2(L) 0.3  Alkaline Phos 38 - 126 U/L 76 76 71  AST 15 - 41 U/L 22 77(H) 26  ALT 0 - 44 U/L 22 100(H) 26      RADIOGRAPHIC STUDIES: I have personally reviewed the radiological images as  listed and agreed with the findings in the report. No results found.    Orders Placed This Encounter  Procedures   CT CHEST ABDOMEN PELVIS W CONTRAST    Standing Status:   Future    Standing Expiration Date:   01/19/2022    Order Specific Question:   Is patient pregnant?    Answer:   No    Order Specific Question:   Preferred imaging location?    Answer:   [facility]    Order  Specific Question:   Is Oral Contrast requested for this exam?    Answer:   Yes, Per Radiology protocol   All questions were answered. The patient knows to call the clinic with any problems, questions or concerns. No barriers to learning was detected. The total time spent in the appointment was 30 minutes.     [name], MD 01/19/2021   I, Wilburn Mylar, am acting as scribe for [name], MD.   I have reviewed the above documentation for accuracy and completeness, and I agree with the above.

## 2021-01-20 ENCOUNTER — Encounter: Payer: Self-pay | Admitting: Hematology

## 2021-01-20 LAB — CANCER ANTIGEN 19-9: CA 19-9: 8 U/mL (ref 0–35)

## 2021-01-22 ENCOUNTER — Telehealth: Payer: Self-pay | Admitting: Hematology

## 2021-01-22 ENCOUNTER — Encounter: Payer: Self-pay | Admitting: Hematology

## 2021-01-22 NOTE — Telephone Encounter (Signed)
Scheduled follow-up appointments per 11/4 los. Patient is aware.

## 2021-01-24 ENCOUNTER — Other Ambulatory Visit: Payer: Medicare Other

## 2021-01-31 ENCOUNTER — Other Ambulatory Visit (INDEPENDENT_AMBULATORY_CARE_PROVIDER_SITE_OTHER): Payer: Medicare Other

## 2021-01-31 ENCOUNTER — Other Ambulatory Visit: Payer: Self-pay

## 2021-01-31 DIAGNOSIS — E039 Hypothyroidism, unspecified: Secondary | ICD-10-CM | POA: Diagnosis not present

## 2021-01-31 LAB — TSH: TSH: 0.02 u[IU]/mL — ABNORMAL LOW (ref 0.35–5.50)

## 2021-02-01 LAB — T4, FREE: Free T4: 1.54 ng/dL (ref 0.60–1.60)

## 2021-02-02 ENCOUNTER — Telehealth: Payer: Self-pay | Admitting: Internal Medicine

## 2021-02-02 MED ORDER — LEVOTHYROXINE SODIUM 100 MCG PO TABS: 100.0000 ug | ORAL_TABLET | Freq: Every day | ORAL | 6 refills | Status: DC

## 2021-02-02 NOTE — Telephone Encounter (Signed)
The patient has not logged into the portal to check on her labs   Please call the patient and let her know that her thyroid testing continues to show that she is on too much thyroid hormone.    Stop levothyroxine 137 MCG  Start levothyroxine 100 MCG daily (including Sundays from now on)   Thanks   Abby Nena Jordan, MD  [facility] Endocrinology  [facility] [address] [place], [place] [address] Phone: [phone] FAX: [phone]

## 2021-02-02 NOTE — Telephone Encounter (Signed)
Patient has been notified and will pick up new dose

## 2021-02-11 ENCOUNTER — Other Ambulatory Visit: Payer: Self-pay | Admitting: Hematology

## 2021-02-11 DIAGNOSIS — E538 Deficiency of other specified B group vitamins: Secondary | ICD-10-CM

## 2021-04-03 NOTE — Progress Notes (Signed)
Name: Mariah Kemp  MRN/ DOB: [id number], [birth year]    Age/ Sex: 61 y.o., female     PCP: Emeterio Reeve, DO   Reason for Endocrinology Evaluation: Hypothyroidism     Initial Endocrinology Clinic Visit: 07/19/2020    PATIENT IDENTIFIER: Mariah Kemp is a 61 y.o., female with a past medical history of Hx of breast Ca ( S/P right mastectomy ) , Hx of gastric cancer HTn and hypothyroidism. She has followed with [place] [facility] clinic since 07/19/2020 for consultative assistance with management of her Hypothyroidism.   HISTORICAL SUMMARY:  She has been diagnosed with gastric cancer in 2020. S/P chemo from 1-07/2018 followed by total gastrectomy 09/2018.  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      She has been diagnosed with Hypothyroidism ~ 30 yrs ago . She has been on LT-4 for years.    No FH of thyroid disease  SUBJECTIVE:    Today (04/04/2021):  Mariah Kemp is here for hypothyroidism.  Saw oncology 01/2021 Has been noted with stable weight with recent increase in weight    She is not feeling quite right  Hair has been falling  She is on Vitamin 12 weekly injections She has been tired as well as dry skin   Denies constipation or diarrhea  Denies local neck symptoms    Levothyroxine 100  mcg daily     HISTORY:  Past Medical History:  Past Medical History:  Diagnosis Date   Anemia    Asthma    Blood transfusion without reported diagnosis    Breast cancer ([place])    Dyspnea    over exertion   Hypertension    Hypothyroidism    Stomach cancer [facility])    Past Surgical History:  Past Surgical History:  Procedure Laterality Date   ABDOMINAL HYSTERECTOMY  1996   BARIATRIC SURGERY     stomach removal   BILATERAL SALPINGOOPHORECTOMY  2007   BIOPSY  03/29/2018   Procedure: BIOPSY;  Surgeon: Wilford Corner, MD;  Location: [place];  Service: Endoscopy;;   CARPAL TUNNEL RELEASE     COLONOSCOPY N/A 03/29/2018   Procedure: COLONOSCOPY;   Surgeon: Wilford Corner, MD;  Location: [place];  Service: Endoscopy;  Laterality: N/A;   COLONOSCOPY     ESOPHAGOGASTRODUODENOSCOPY N/A 03/29/2018   Procedure: ESOPHAGOGASTRODUODENOSCOPY (EGD);  Surgeon: Wilford Corner, MD;  Location: [place];  Service: Endoscopy;  Laterality: N/A;   GASTRECTOMY N/A 10/02/2018   Procedure: TOTAL GASTRECTOMY;  Surgeon: [name], MD;  Location: [place];  Service: General;  Laterality: N/A;   GASTROSTOMY N/A 10/02/2018   Procedure: FEEDING TUBE PLACEMENT;  Surgeon: [name], MD;  Location: [place];  Service: General;  Laterality: N/A;   LAPAROSCOPY N/A 04/13/2018   Procedure: LAPAROSCOPY DIAGNOSTIC;  Surgeon: [name], MD;  [facility];  Service: General;  Laterality: N/A;   LAPAROSCOPY N/A 10/02/2018   Procedure: LAPAROSCOPY DIAGNOSTIC;  Surgeon: [name], MD;  Location: [place];  Service: General;  Laterality: N/A;   MASTECTOMY Right 2007   PORT-A-CATH REMOVAL Left 11/07/2020   Procedure: PORT REMOVAL;  Surgeon: [name], MD;  Location: [place];  Service: General;  Laterality: Left;   PORTACATH PLACEMENT Left 04/13/2018   Procedure: [place];  Surgeon: [name], MD;  [facility];  Service: General;  Laterality: Left;  subclavian   UPPER GASTROINTESTINAL ENDOSCOPY     Social History:  reports that she quit smoking about 26 years ago. Her smoking use included cigarettes.  She has a 7.50 pack-year smoking history. She has never used smokeless tobacco. She reports that she does not drink alcohol and does not use drugs. Family History:  Family History  Problem Relation Age of Onset   Liver cancer Father    Cancer Paternal Uncle        unknown type cancer   Cirrhosis Mother    Colon cancer Neg Hx    Esophageal cancer Neg Hx    Rectal cancer Neg Hx    Stomach cancer Neg Hx      HOME MEDICATIONS: Allergies as of 04/04/2021   No Known Allergies      Medication List         Accurate as of April 04, 2021  1:46 PM. If you have any questions, ask your nurse or doctor.          acyclovir 400 MG tablet Commonly known as: Zovirax Take 1 tablet (400 mg total) by mouth 3 (three) times daily. For five days. As needed for cold sore. What changed:  when to take this reasons to take this additional instructions   Advair HFA 115-21 MCG/ACT inhaler Generic drug: fluticasone-salmeterol Inhale 2 puffs into the lungs 2 (two) times daily. What changed:  when to take this reasons to take this   albuterol 108 (90 Base) MCG/ACT inhaler Commonly known as: VENTOLIN HFA Inhale 1-2 puffs into the lungs every 4 (four) hours as needed for wheezing or shortness of breath.   calcium carbonate 500 MG chewable tablet Commonly known as: TUMS - dosed in mg elemental calcium Chew 1 tablet (200 mg of elemental calcium total) by mouth 3 (three) times daily.   cyanocobalamin 1000 MCG/ML injection Commonly known as: (VITAMIN B-12) INJECT 1 ML (1,000 MCG TOTAL) INTO THE MUSCLE ONCE A WEEK.   levothyroxine 100 MCG tablet Commonly known as: SYNTHROID Take 1 tablet (100 mcg total) by mouth daily.   loperamide 2 MG capsule Commonly known as: IMODIUM Take 1 capsule (2 mg total) by mouth 2 (two) times daily as needed for diarrhea or loose stools.   Needles & Syringes Misc 25 ga x 1 " IM needle/syringe   Potassium 99 MG Tabs Take 99 mg by mouth daily.          OBJECTIVE:   PHYSICAL EXAM: VS: BP 120/74 (BP Location: Left Arm, Patient Position: Sitting, Cuff Size: Small)    Pulse 85    Ht 5' (1.524 m)    Wt 122 lb 6.4 oz (55.5 kg)    SpO2 96%    BMI 23.90 kg/m    EXAM: General: Pt appears well and is in NAD  Neck: General: Supple without adenopathy. Thyroid: Thyroid size normal.  No goiter or nodules appreciated.  Lungs: Clear with good BS bilat with no rales, rhonchi, or wheezes  Heart: Auscultation: RRR.  Abdomen: Normoactive bowel sounds, soft, nontender, without  masses or organomegaly palpable  Extremities:  BL LE: No pretibial edema normal ROM and strength.  Mental Status: Judgment, insight: Intact Memory: Intact for recent and remote events Mood and affect: No depression, anxiety, or agitation     DATA REVIEWED:   Latest Reference Range & Units 04/04/21 13:41  TSH 0.35 - 5.50 uIU/mL 1.61   ASSESSMENT / PLAN / RECOMMENDATIONS:   Hypothyroidism:   - Pt has not feeling quite right with hair loss and less energetic  - No local neck symptoms  - She is compliant with proper LT-4 intake  - I have advised her  to start Biotin to see if it would help with hair and nails as long as she remembers to hold it 2-3 days before any future TFT checks  -TSH has normalized    Medications   Continue Levothyroxine 100 mcg daily    Follow-up in 4 months     Signed electronically by: Mack Guise, MD  [facility] Endocrinology  [facility] [address] [place], [place] [address] Phone: [phone] FAX: [phone]      CC: Florinda Marker [address] [facility] [address] [place] [address] Phone: [phone]  Fax: [phone]   Return to Endocrinology clinic as below: Future Appointments  Date Time Provider [place]  04/17/2021  1:20 PM Samuel Bouche, NP [facility] None  07/16/2021  9:00 AM [facility] None  07/18/2021 11:20 AM [name], MD [facility] None

## 2021-04-04 ENCOUNTER — Other Ambulatory Visit: Payer: Self-pay

## 2021-04-04 ENCOUNTER — Ambulatory Visit: Payer: Medicare Other | Admitting: Internal Medicine

## 2021-04-04 ENCOUNTER — Encounter: Payer: Self-pay | Admitting: Internal Medicine

## 2021-04-04 VITALS — BP 120/74 | HR 85 | Ht 60.0 in | Wt 122.4 lb

## 2021-04-04 DIAGNOSIS — E039 Hypothyroidism, unspecified: Secondary | ICD-10-CM | POA: Diagnosis not present

## 2021-04-04 LAB — TSH: TSH: 1.61 u[IU]/mL (ref 0.35–5.50)

## 2021-04-04 NOTE — Patient Instructions (Signed)
You are on levothyroxine - which is your thyroid hormone supplement. You MUST take this consistently.  You should take this first thing in the morning on an empty stomach with water. You should not take it with other medications. Wait 38min to 1hr prior to eating. If you are taking any vitamins - please take these in the evening.   If you miss a dose, please take your missed dose the following day (double the dose for that day). You should have a pill box for ONLY levothyroxine on your bedside table to help you remember to take your medications.    HOLD Biotin 2-3 days before future thyroid blood work

## 2021-04-05 MED ORDER — LEVOTHYROXINE SODIUM 100 MCG PO TABS: 100.0000 ug | ORAL_TABLET | Freq: Every day | ORAL | 3 refills | Status: DC

## 2021-04-17 ENCOUNTER — Other Ambulatory Visit: Payer: Self-pay

## 2021-04-17 ENCOUNTER — Ambulatory Visit (INDEPENDENT_AMBULATORY_CARE_PROVIDER_SITE_OTHER): Payer: Medicare Other | Admitting: Medical-Surgical

## 2021-04-17 ENCOUNTER — Encounter: Payer: Self-pay | Admitting: Medical-Surgical

## 2021-04-17 VITALS — BP 111/70 | HR 66 | Resp 20 | Ht 60.0 in | Wt 122.2 lb

## 2021-04-17 DIAGNOSIS — I10 Essential (primary) hypertension: Secondary | ICD-10-CM | POA: Diagnosis not present

## 2021-04-17 DIAGNOSIS — J410 Simple chronic bronchitis: Secondary | ICD-10-CM

## 2021-04-17 DIAGNOSIS — J4540 Moderate persistent asthma, uncomplicated: Secondary | ICD-10-CM | POA: Diagnosis not present

## 2021-04-17 DIAGNOSIS — B001 Herpesviral vesicular dermatitis: Secondary | ICD-10-CM

## 2021-04-17 DIAGNOSIS — Z7689 Persons encountering health services in other specified circumstances: Secondary | ICD-10-CM | POA: Diagnosis not present

## 2021-04-17 DIAGNOSIS — T753XXA Motion sickness, initial encounter: Secondary | ICD-10-CM

## 2021-04-17 MED ORDER — SCOPOLAMINE 1 MG/3DAYS TD PT72: 1.0000 | MEDICATED_PATCH | TRANSDERMAL | Status: DC

## 2021-04-17 MED ORDER — ACYCLOVIR 400 MG PO TABS: 400.0000 mg | ORAL_TABLET | Freq: Three times a day (TID) | ORAL | 0 refills | Status: DC | PRN

## 2021-04-17 MED ORDER — ALBUTEROL SULFATE HFA 108 (90 BASE) MCG/ACT IN AERS: 1.0000 | INHALATION_SPRAY | RESPIRATORY_TRACT | 11 refills | Status: DC | PRN

## 2021-04-17 MED ORDER — ADVAIR HFA 115-21 MCG/ACT IN AERO: 2.0000 | INHALATION_SPRAY | Freq: Two times a day (BID) | RESPIRATORY_TRACT | 99 refills | Status: DC

## 2021-04-17 NOTE — Progress Notes (Signed)
°  HPI with pertinent ROS:   CC: Transfer of care  HPI: Very pleasant 61 year old female presenting today to transfer care to new PCP and for the following:  Cold sores-taking acyclovir 400 mg as prescribed on an as-needed basis.  Unfortunately, she has a current cold sore which is particularly bothersome on her lower lip.  Notes that she gets them a lot and winter due to the dry air and frequent weather changes.  Requesting a refill on her medication.  Asthma/orchitis-using Advair as prescribed, tolerating well without side effects.  Notes that she does forget doses on Sundays but uses it fairly regularly.  Has an albuterol inhaler which she uses on an as-needed basis and reports needing this infrequently.  Denies shortness of breath at rest.  Tolerates regular daily activities without dyspnea.  Hypothyroidism-followed by endocrinology.  Taking levothyroxine 100 mcg daily.  Has been on thyroid replacement medication for the past 30 years  Will be going on a cruise in February.  Has a history of motion sickness while on cruises and wonders if there is a prescription medication that may be beneficial.  She does have Dramamine as well as the wrist bands but is nervous that she may get on ship and they will not be effective.  I reviewed the past medical history, family history, social history, surgical history, and allergies today and no changes were needed.  Please see the problem list section below in epic for further details.   Physical exam:   General: Well Developed, well nourished, and in no acute distress.  Neuro: Alert and oriented x3.  HEENT: Normocephalic, atraumatic.  Skin: Warm and dry. Cardiac: Regular rate and rhythm, no murmurs rubs or gallops, no lower extremity edema.  Respiratory: Clear to auscultation bilaterally. Not using accessory muscles, speaking in full sentences.  Impression and Recommendations:    1. Encounter to establish care Reviewed available information and  discussed care concerns with patient.   2. Essential hypertension Well-controlled and at goal today.  3. Simple chronic bronchitis ([place]) 4. Moderate persistent asthma without complication Continue Advair twice daily.  Continue albuterol as needed.  Symptoms well controlled on current regimen.  5. Motion sickness, initial encounter Sending in scopolamine patches.  Discussed appropriate use of this medication while on her cruise.  Okay to use Dramamine or motion sickness bands as desired. - scopolamine (TRANSDERM-SCOP) 1 MG/3DAYS 1.5 mg  6. Cold sore Continue acyclovir 3 times daily x5 days as needed for cold sores.  Refill sent to pharmacy.  Return in about 6 months (around 10/15/2021) for chronic disease follow up. ___________________________________________ Clearnce Sorrel, DNP, APRN, FNP-BC [facility] [place]

## 2021-04-24 ENCOUNTER — Other Ambulatory Visit: Payer: Self-pay | Admitting: Hematology

## 2021-05-30 ENCOUNTER — Encounter: Payer: Self-pay | Admitting: Internal Medicine

## 2021-05-30 ENCOUNTER — Other Ambulatory Visit (INDEPENDENT_AMBULATORY_CARE_PROVIDER_SITE_OTHER): Payer: Medicare Other

## 2021-05-30 ENCOUNTER — Other Ambulatory Visit: Payer: Self-pay

## 2021-05-30 DIAGNOSIS — E039 Hypothyroidism, unspecified: Secondary | ICD-10-CM

## 2021-05-30 LAB — TSH: TSH: 95.04 u[IU]/mL — ABNORMAL HIGH (ref 0.35–5.50)

## 2021-05-31 ENCOUNTER — Telehealth: Payer: Self-pay

## 2021-05-31 NOTE — Telephone Encounter (Signed)
Patient left message on voicemail requesting for a prescription to help with motion sickness for her cruise.  ?

## 2021-06-01 MED ORDER — SCOPOLAMINE 1 MG/3DAYS TD PT72: 1.0000 | MEDICATED_PATCH | TRANSDERMAL | 2 refills | Status: DC

## 2021-06-01 NOTE — Telephone Encounter (Signed)
Scopolamine patches resent to her pharmacy. Please pass along my apologies for the error in order entry. Let her know to call us asap if she has any trouble getting them from the pharmacy.  ? ?___________________________________________ ?Clearnce Sorrel, DNP, APRN, FNP-BC ?[facility] ?[place] ? ?

## 2021-06-01 NOTE — Telephone Encounter (Signed)
Patient stated she was never able to pick up prescription because pharmacy stated they never received it. Prescription on 04/17/21 for the patch looks like it may have been entered as a clinic administer medication instead of prescription to pharmacy.  ?

## 2021-06-01 NOTE — Telephone Encounter (Signed)
Patient advised.

## 2021-07-10 ENCOUNTER — Telehealth: Payer: Self-pay | Admitting: Hematology

## 2021-07-10 ENCOUNTER — Other Ambulatory Visit (INDEPENDENT_AMBULATORY_CARE_PROVIDER_SITE_OTHER): Payer: Medicare Other

## 2021-07-10 DIAGNOSIS — E039 Hypothyroidism, unspecified: Secondary | ICD-10-CM

## 2021-07-10 DIAGNOSIS — C162 Malignant neoplasm of body of stomach: Secondary | ICD-10-CM

## 2021-07-10 LAB — TSH: TSH: >46.3 u[IU]/mL — ABNORMAL HIGH (ref 0.35–5.50)

## 2021-07-10 LAB — T4, FREE: Free T4: 0.87 ng/dL (ref 0.60–1.60)

## 2021-07-10 MED ORDER — LEVOTHYROXINE SODIUM 100 MCG PO TABS: 150.0000 ug | ORAL_TABLET | Freq: Every day | ORAL | 1 refills | Status: DC

## 2021-07-10 NOTE — Telephone Encounter (Signed)
Per 4/25 in basket called pt and spoke to pt about appointment  pt confirmed appointment  ?

## 2021-07-16 ENCOUNTER — Ambulatory Visit (HOSPITAL_COMMUNITY)
Admission: RE | Admit: 2021-07-16 | Discharge: 2021-07-16 | Disposition: A | Discharge status: Home / Self Care | Payer: Medicare Other | Source: Ambulatory Visit | Attending: Hematology | Admitting: Hematology

## 2021-07-16 ENCOUNTER — Inpatient Hospital Stay: Payer: Medicare Other | Attending: Hematology

## 2021-07-16 ENCOUNTER — Other Ambulatory Visit: Payer: Self-pay

## 2021-07-16 DIAGNOSIS — D500 Iron deficiency anemia secondary to blood loss (chronic): Secondary | ICD-10-CM

## 2021-07-16 DIAGNOSIS — C162 Malignant neoplasm of body of stomach: Secondary | ICD-10-CM | POA: Insufficient documentation

## 2021-07-16 DIAGNOSIS — R918 Other nonspecific abnormal finding of lung field: Secondary | ICD-10-CM | POA: Diagnosis not present

## 2021-07-16 DIAGNOSIS — I700 Atherosclerosis of aorta: Secondary | ICD-10-CM | POA: Diagnosis not present

## 2021-07-16 DIAGNOSIS — Z903 Acquired absence of stomach [part of]: Secondary | ICD-10-CM | POA: Insufficient documentation

## 2021-07-16 DIAGNOSIS — C169 Malignant neoplasm of stomach, unspecified: Secondary | ICD-10-CM | POA: Insufficient documentation

## 2021-07-16 DIAGNOSIS — D649 Anemia, unspecified: Secondary | ICD-10-CM | POA: Insufficient documentation

## 2021-07-16 DIAGNOSIS — Z853 Personal history of malignant neoplasm of breast: Secondary | ICD-10-CM | POA: Insufficient documentation

## 2021-07-16 DIAGNOSIS — E538 Deficiency of other specified B group vitamins: Secondary | ICD-10-CM | POA: Insufficient documentation

## 2021-07-16 DIAGNOSIS — E039 Hypothyroidism, unspecified: Secondary | ICD-10-CM | POA: Insufficient documentation

## 2021-07-16 DIAGNOSIS — Z85028 Personal history of other malignant neoplasm of stomach: Secondary | ICD-10-CM | POA: Insufficient documentation

## 2021-07-16 DIAGNOSIS — I10 Essential (primary) hypertension: Secondary | ICD-10-CM | POA: Insufficient documentation

## 2021-07-16 DIAGNOSIS — Z9884 Bariatric surgery status: Secondary | ICD-10-CM | POA: Insufficient documentation

## 2021-07-16 LAB — CBC WITH DIFFERENTIAL (CANCER CENTER ONLY)
Abs Immature Granulocytes: 0.01 10*3/uL (ref 0.00–0.07)
Basophils Absolute: 0.1 10*3/uL (ref 0.0–0.1)
Basophils Relative: 2 %
Eosinophils Absolute: 0.5 10*3/uL (ref 0.0–0.5)
Eosinophils Relative: 9 %
HCT: 38.1 % (ref 36.0–46.0)
Hemoglobin: 13 g/dL (ref 12.0–15.0)
Immature Granulocytes: 0 %
Lymphocytes Relative: 27 %
Lymphs Abs: 1.6 10*3/uL (ref 0.7–4.0)
MCH: 33.1 pg (ref 26.0–34.0)
MCHC: 34.1 g/dL (ref 30.0–36.0)
MCV: 96.9 fL (ref 80.0–100.0)
Monocytes Absolute: 0.4 10*3/uL (ref 0.1–1.0)
Monocytes Relative: 7 %
Neutro Abs: 3.3 10*3/uL (ref 1.7–7.7)
Neutrophils Relative %: 55 %
Platelet Count: 179 10*3/uL (ref 150–400)
RBC: 3.93 MIL/uL (ref 3.87–5.11)
RDW: 13.4 % (ref 11.5–15.5)
WBC Count: 5.9 10*3/uL (ref 4.0–10.5)
nRBC: 0 % (ref 0.0–0.2)

## 2021-07-16 LAB — CMP (CANCER CENTER ONLY)
ALT: 45 U/L — ABNORMAL HIGH (ref 0–44)
AST: 38 U/L (ref 15–41)
Albumin: 4.3 g/dL (ref 3.5–5.0)
Alkaline Phosphatase: 89 U/L (ref 38–126)
Anion gap: 7 (ref 5–15)
BUN: 11 mg/dL (ref 6–20)
CO2: 32 mmol/L (ref 22–32)
Calcium: 9.9 mg/dL (ref 8.9–10.3)
Chloride: 102 mmol/L (ref 98–111)
Creatinine: 0.55 mg/dL (ref 0.44–1.00)
GFR, Estimated: >60 mL/min (ref >60)
Glucose, Bld: 86 mg/dL (ref 70–99)
Potassium: 3.7 mmol/L (ref 3.5–5.1)
Sodium: 141 mmol/L (ref 135–145)
Total Bilirubin: 0.3 mg/dL (ref 0.3–1.2)
Total Protein: 8.2 g/dL — ABNORMAL HIGH (ref 6.5–8.1)

## 2021-07-16 LAB — FERRITIN: Ferritin: 592 ng/mL — ABNORMAL HIGH (ref 11–307)

## 2021-07-16 LAB — VITAMIN B12: Vitamin B-12: 781 pg/mL (ref 180–914)

## 2021-07-16 MED ORDER — IOHEXOL 300 MG/ML  SOLN
100.0000 mL | Freq: Once | INTRAMUSCULAR | Status: AC | PRN
  Administered 2021-07-16: 100 mL via INTRAVENOUS

## 2021-07-16 MED ORDER — SODIUM CHLORIDE (PF) 0.9 % IJ SOLN
INTRAMUSCULAR | Status: AC
  Filled 2021-07-16: qty 50

## 2021-07-17 LAB — CANCER ANTIGEN 19-9: CA 19-9: 10 U/mL (ref 0–35)

## 2021-07-18 ENCOUNTER — Inpatient Hospital Stay: Payer: Medicare Other | Admitting: Hematology

## 2021-07-23 ENCOUNTER — Encounter: Payer: Self-pay | Admitting: Hematology

## 2021-07-23 ENCOUNTER — Other Ambulatory Visit: Payer: Self-pay

## 2021-07-23 ENCOUNTER — Inpatient Hospital Stay (HOSPITAL_BASED_OUTPATIENT_CLINIC_OR_DEPARTMENT_OTHER): Payer: Medicare Other | Admitting: Hematology

## 2021-07-23 VITALS — BP 139/79 | HR 86 | Temp 97.7°F | Resp 18 | Ht 60.0 in | Wt 126.9 lb

## 2021-07-23 DIAGNOSIS — C162 Malignant neoplasm of body of stomach: Secondary | ICD-10-CM

## 2021-07-23 DIAGNOSIS — I10 Essential (primary) hypertension: Secondary | ICD-10-CM | POA: Diagnosis not present

## 2021-07-23 DIAGNOSIS — Z853 Personal history of malignant neoplasm of breast: Secondary | ICD-10-CM | POA: Diagnosis not present

## 2021-07-23 DIAGNOSIS — Z9884 Bariatric surgery status: Secondary | ICD-10-CM | POA: Diagnosis not present

## 2021-07-23 DIAGNOSIS — E039 Hypothyroidism, unspecified: Secondary | ICD-10-CM | POA: Diagnosis not present

## 2021-07-23 DIAGNOSIS — E538 Deficiency of other specified B group vitamins: Secondary | ICD-10-CM | POA: Diagnosis not present

## 2021-07-23 DIAGNOSIS — D649 Anemia, unspecified: Secondary | ICD-10-CM | POA: Diagnosis not present

## 2021-07-23 DIAGNOSIS — Z903 Acquired absence of stomach [part of]: Secondary | ICD-10-CM | POA: Diagnosis not present

## 2021-07-23 DIAGNOSIS — C169 Malignant neoplasm of stomach, unspecified: Secondary | ICD-10-CM | POA: Diagnosis not present

## 2021-07-23 MED ORDER — NEEDLES & SYRINGES MISC: 99 refills | Status: DC

## 2021-07-23 NOTE — Progress Notes (Addendum)
?[facility]   ?[phone] [phone]   ?Clinic Follow up Note  ? ?Patient Care Team: ?Samuel Bouche, NP as PCP - General (Nurse Practitioner) ?[name], MD as Consulting Physician (General Surgery) ?[name], MD as Consulting Physician (Hematology) ?Alla Feeling, NP as Nurse Practitioner (Nurse Practitioner) ? ?Date of Service:  07/23/2021 ? ?CHIEF COMPLAINT: f/u of gastric cancer ? ?CURRENT THERAPY:  ?Surveillance ? ?ASSESSMENT & PLAN:  ?Mariah Kemp is a 61 y.o. female with  ? ?1. 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 Given her locally advanced disease, she completed 8 cycles of neoadjuvant FLOT4.  ?-s/p total Gastrectomy and lymph node dissection and had feeding tube placed on 10/02/18. She had complete pathologic response with all nodes negative.  ?-Given her pCR, I do not recommend more adjuvant radiation or chemotherapy. She is currently on surveillance.  ?-surveillance CT CAP from 07/16/21 showed NED. I reviewed the images and discussed the results with her today. ?-she is clinically doing well. Labs reviewed, CBC overall WNL, other labs pending. She is now over 3 years out, risk of recurrence is much less.  ?-f/u in 6 months ?  ?2. Anemia, B12 deficiency S/p gastric surgery  ?-Given 3u blood transfusion on 04/01/18 and IV Feraheme in 03/2018 and  2-05/2019. ?-Started monthly B12 injections in 01/2019. Switched to home injections after 09/03/19, now every week.  ?-ferritin remains elevated, B12 WNL on 07/16/21 ?-Hg now WNL ?  ?3. H/o stage 3 right breast cancer in 2007, Genetic Testing was not pursued  ?-Treated with right mastectomy, reconstruction with 3 LN removed. She underwent chemotherapy for 5 months, likely AC-T. She completed Tamoxifen for at least 5 years.  ?  ?4. HTN, hypothyroidism, Health maintenance  ?-Continue to follow-up with PCP ?-Her 03/2018 Colonoscopy was overall normal and  benign. Recommended to repeat 10 years later.  ?-She is overdue for mammogram, last 01/2019. ?  ?  ?PLAN: ?-continue B12 weekly at home ?-lab and f/u in 6 months ?-She will f/u with PCP in the interim  ? ? ?No problem-specific Assessment & Plan notes found for this encounter. ? ? ?SUMMARY OF ONCOLOGIC HISTORY: ?Oncology History Overview Note  ?Cancer Staging ?Gastric cancer ([place]) ?Staging form: Stomach, AJCC 8th Edition ?- Clinical stage from 03/29/2018: Stage IVA (cT4b, cN1, cM0) - Signed by [name], MD on 04/03/2018 ?- Pathologic stage from 10/02/2018: ypT0, pN0, cM0 - Signed by Alla Feeling, NP on 01/19/2019 ? ? ?  ?Gastric cancer ([place])  ?03/19/2018 Procedure  ? Upper Endoscopy by Dr. Michail Sermon 03/29/18  ?IMPRESSION ?- Normal esophagus. ?- Z-line regular, 38 cm from the incisors. ?- Non-obstructing oozing gastric ulcer with pigmented material. Biopsied. ?- Normal examined duodenum. ?- Acute gastritis. ? ?  ?03/29/2018 Procedure  ? Colonoscopy by Dr. Michail Sermon 03/29/18 ?IMPRESSION ?- Preparation of the colon was fair. ?- Internal hemorrhoids. ?- The examined portion of the ileum was normal. ?- No specimens collected. ? ?  ?03/29/2018 Initial Biopsy  ? Diagnosis 03/29/18 ?Stomach, biopsy, Proximal ?- ADENOCARCINOMA. ?- GOBLET CELL METAPLASIA. ?- SEE COMMENT. ?Microscopic Comment ?A Warthin Starry stain is negative for the presence of Helicobacter pylori organisms. Dr Vic Ripper has reviewed the case ?and concurs with this interpretation. Dr Michail Sermon was paged on 03/31/2018. (JBK:ecj 03/31/2018) ? ?  ?03/29/2018 Cancer Staging  ? Staging form: Stomach, AJCC 8th Edition ?- Clinical stage from 03/29/2018: Stage IVA (cT4b, cN1, cM0) - Signed by [name], MD  on 04/03/2018 ? ?  ?04/02/2018 Initial Diagnosis  ? Gastric cancer ([place]) ? ?  ?04/02/2018 Imaging  ? CT CAP W Contrast 04/02/18 ?IMPRESSION: ?1. Focal thickening lateral wall proximal stomach with protrusion of ?low-attenuation soft tissue beyond the expected confines of  the ?gastric wall into the splenic hilum. Imaging features highly ?concerning for transmural tumor extension into the splenic hilum. ?Several small nodules in this region are likely lymph nodes, ?concerning for metastatic disease. ?2. Scattered bilateral pulmonary nodules measuring up to 5 mm. ?Nonspecific, but close attention on follow-up recommended as ?metastatic disease not excluded. ?3. Small lymph nodes in the gastrohepatic ligament, but there is no ?gastrohepatic or hepato duodenal ligament lymphadenopathy. No ?evidence for liver metastases. ?4.  Aortic Atherosclerois (ICD10-170.0 ? ?  ?04/15/2018 - 07/22/2018 Chemotherapy  ? FLOT4 every 2 weeks starting 04/15/18. She completed 8 cycles on 07/22/18. ?  ?08/21/2018 Imaging  ? CT CAP W Contrast  ?IMPRESSION: ?1. No evidence of residual gastric mass. No abdominopelvic ?adenopathy. ?2. Similar bilateral pulmonary nodules, indeterminate. ?3. Development of upper lung and peribronchovascular predominant ?pulmonary opacities. Differential considerations include drug ?toxicity or atypical infection. Lymphangitic tumor spread felt less ?likely but cannot be excluded. Consider chest CT follow-up at 3-6 ?months. ?4.  Aortic Atherosclerosis (ICD10-I70.0). ?  ? ?  ?10/02/2018 Surgery  ? LAPAROSCOPY DIAGNOSTIC, TOTAL GASTRECTOMY, FEEDING TUBE PLACEMENT by Dr. Barry Dienes  ?10/02/18  ?  ?10/02/2018 Pathology Results  ? Diagnosis 10/02/18 ?Stomach, resection for tumor, Total ?- CHRONIC GASTRITIS WITH GOBLET CELL METAPLASIA AND ULCERATION. ?- ADENOCARCINOMA IS NOT IDENTIFIED. ?- THERE IS NO EVIDENCE OF CARCINOMA IN 34 OF 34 LYMPH NODES (0/34). ?- SEE ONCOLOGY TABLE BELOW. ?  ?10/02/2018 Cancer Staging  ? Staging form: Stomach, AJCC 8th Edition ?- Pathologic stage from 10/02/2018: ypT0, pN0, cM0 - Signed by Alla Feeling, NP on 01/19/2019 ? ?  ?01/20/2019 Survivorship  ? Per Cira Rue, NP  ?  ?03/10/2019 Imaging  ? CT CAP W contrast ?IMPRESSION: ?1. Interval Roux-en-Y esophagojejunostomy with  no the definite ?findings of metastatic disease in the chest, abdomen, or pelvis. ?2. Multiple irregular bilateral pulmonary nodules are smaller than ?on the previous study. This may be infectious/inflammatory etiology ?but continued close attention on follow-up recommended. ?3. Central mesenteric lymph nodes measure upper normal for size and ?are slightly increased since the preoperative exam. Attention on ?follow-up recommended. ?4.  Aortic Atherosclerois (ICD10-170.0) ?  ?08/31/2019 Imaging  ? CT CAP w contrast  ?IMPRESSION: ?1. Stable exam. No new or progressive findings in the chest, ?abdomen, or pelvis. ?2. Numerous bilateral irregular nodular opacities in both lungs are ?stable in the interval. No new suspicious pulmonary nodule or mass. ?3. Stable borderline enlarged portocaval lymph node. ?4. Aortic Atherosclerosis (ICD10-I70.0). ?  ?07/17/2020 Imaging  ? CT CAP  ?IMPRESSION: ?1. No substantial interval change in exam. No new or progressive ?findings to suggest recurrent or metastatic disease in the chest, ?abdomen, or pelvis. ?2. Bilateral pulmonary nodules, similar to prior. Given upper lung ?predominance and interval stability, these are probably ?infectious/inflammatory. Continued attention on follow-up ?recommended. ?3. Aortic Atherosclerosis (ICD10-I70.0). ?  ? ? ? ?INTERVAL HISTORY:  ?Mariah Kemp is here for a follow up of gastric cancer. She was last seen by me on 01/19/22. She presents to the clinic alone. ?She reports she is doing well overall. She notes continued issues with her esophagus and feeling full. She tells me her GI doctor explained her esophagus may take a while to fully adjust. ?She notes she is  dealing with supporting her husband, who has early Alzheimer's. ?  ?All other systems were reviewed with the patient and are negative. ? ?MEDICAL HISTORY:  ?Past Medical History:  ?Diagnosis Date  ? Anemia   ? Asthma   ? Blood transfusion without reported diagnosis   ? Breast cancer ([place])   ?  Dyspnea   ? over exertion  ? Hypertension   ? Hypothyroidism   ? Stomach cancer ([place])   ? ? ?SURGICAL HISTORY: ?Past Surgical History:  ?Procedure Laterality Date  ? ABDOMINAL HYSTERECTOMY  1996  ? BARIATRIC SURG

## 2021-08-02 ENCOUNTER — Ambulatory Visit: Payer: Medicare Other | Admitting: Internal Medicine

## 2021-08-06 ENCOUNTER — Other Ambulatory Visit: Payer: Self-pay | Admitting: Hematology

## 2021-08-28 ENCOUNTER — Ambulatory Visit (INDEPENDENT_AMBULATORY_CARE_PROVIDER_SITE_OTHER): Payer: Medicare Other | Admitting: Medical-Surgical

## 2021-08-28 DIAGNOSIS — Z1231 Encounter for screening mammogram for malignant neoplasm of breast: Secondary | ICD-10-CM | POA: Diagnosis not present

## 2021-08-28 DIAGNOSIS — Z0000 Encounter for general adult medical examination without abnormal findings: Secondary | ICD-10-CM | POA: Diagnosis not present

## 2021-08-28 NOTE — Progress Notes (Signed)
MEDICARE ANNUAL WELLNESS VISIT  08/28/2021  Telephone Visit Disclaimer This Medicare AWV was conducted by telephone due to national recommendations for restrictions regarding the COVID-19 Pandemic (e.g. social distancing).  I verified, using two identifiers, that I am speaking with Mariah Kemp or their authorized healthcare agent. I discussed the limitations, risks, security, and privacy concerns of performing an evaluation and management service by telephone and the potential availability of an in-person appointment in the future. The patient expressed understanding and agreed to proceed.  Location of Patient: Home Location of Provider (nurse):  In the office.  Subjective:    Mariah Kemp is a 61 y.o. female patient of Samuel Bouche, NP who had a Medicare Annual Wellness Visit today via telephone. Dexter is Disabled and lives with their spouse. she has 2 children. she reports that she is socially active and does interact with friends/family regularly. she is minimally physically active and enjoys reading and watching television.  Patient Care Team: Samuel Bouche, NP as PCP - General (Nurse Practitioner) [name], MD as Consulting Physician (General Surgery) [name], MD as Consulting Physician (Hematology) Alla Feeling, NP as Nurse Practitioner (Nurse Practitioner)     08/28/2021   10:09 AM 11/07/2020   11:49 AM 06/04/2019    9:22 AM 10/24/2018   11:02 AM 10/07/2018    8:00 PM 10/02/2018    9:43 AM 09/29/2018    9:11 AM  Advanced Directives  Does Patient Have a Medical Advance Directive? No No No No  Yes Yes  Type of Visual merchandiser of [place];Living will  [place];Living will  Does patient want to make changes to medical advance directive?    No - Kemp declined No - Patient declined  No - Patient declined  Copy of [place] in Chart?    No - copy requested   No - copy requested   Would patient like information on creating a medical advance directive? No - Patient declined No - Patient declined  No - Patient declined       Hospital Utilization Over the Past 12 Months: # of hospitalizations or ER visits: 0 # of surgeries: 0  Review of Systems    Patient reports that her overall health is unchanged compared to last year.  History obtained from chart review and the patient  Patient Reported Readings (BP, Pulse, CBG, Weight, etc) none  Pain Assessment Pain : No/denies pain     Current Medications & Allergies (verified) Allergies as of 08/28/2021   No Known Allergies      Medication List        Accurate as of August 28, 2021 10:18 AM. If you have any questions, ask your nurse or doctor.          acyclovir 400 MG tablet Commonly known as: Zovirax Take 1 tablet (400 mg total) by mouth 3 (three) times daily as needed. For five days. As needed for cold sore.   Advair HFA 115-21 MCG/ACT inhaler Generic drug: fluticasone-salmeterol Inhale 2 puffs into the lungs 2 (two) times daily.   albuterol 108 (90 Base) MCG/ACT inhaler Commonly known as: VENTOLIN HFA Inhale 1-2 puffs into the lungs every 4 (four) hours as needed for wheezing or shortness of breath.   B-D 3CC LUER-LOK SYR 25GX1" 25G X 1" 3 ML Misc Generic drug: SYRINGE-NEEDLE (DISP) 3 ML USE AS DIRECTED ONCE A WEEK   calcium carbonate 500 MG  chewable tablet Commonly known as: TUMS - dosed in mg elemental calcium Chew 1 tablet (200 mg of elemental calcium total) by mouth 3 (three) times daily.   cyanocobalamin 1000 MCG/ML injection Commonly known as: (VITAMIN B-12) INJECT 1 ML (1,000 MCG TOTAL) INTO THE MUSCLE ONCE A WEEK.   levothyroxine 100 MCG tablet Commonly known as: SYNTHROID Take 1.5 tablets (150 mcg total) by mouth daily.   loperamide 2 MG capsule Commonly known as: IMODIUM Take 1 capsule (2 mg total) by mouth 2 (two) times daily as needed for diarrhea or loose stools.    Needles & Syringes Misc 25 ga x 1 " IM needle/syringe   Potassium 99 MG Tabs Take 99 mg by mouth daily.   scopolamine 1 MG/3DAYS Commonly known as: TRANSDERM-SCOP Place 1 patch (1.5 mg total) onto the skin every 3 (three) days.        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[facility])    Past Surgical History:  Procedure Laterality Date   ABDOMINAL HYSTERECTOMY  1996   BARIATRIC SURGERY     stomach removal   BILATERAL SALPINGOOPHORECTOMY  2007   BIOPSY  03/29/2018   Procedure: BIOPSY;  Surgeon: Wilford Corner, MD;  Location: [place];  Service: Endoscopy;;   CARPAL TUNNEL RELEASE     COLONOSCOPY N/A 03/29/2018   Procedure: COLONOSCOPY;  Surgeon: Wilford Corner, MD;  Location: [place];  Service: Endoscopy;  Laterality: N/A;   COLONOSCOPY     ESOPHAGOGASTRODUODENOSCOPY N/A 03/29/2018   Procedure: ESOPHAGOGASTRODUODENOSCOPY (EGD);  Surgeon: Wilford Corner, MD;  Location: [place];  Service: Endoscopy;  Laterality: N/A;   GASTRECTOMY N/A 10/02/2018   Procedure: TOTAL GASTRECTOMY;  Surgeon: [name], MD;  Location: [place] [facility];  Service: General;  Laterality: N/A;   GASTROSTOMY N/A 10/02/2018   Procedure: FEEDING TUBE PLACEMENT;  Surgeon: [name], MD;  Location: [place] [facility];  Service: General;  Laterality: N/A;   LAPAROSCOPY N/A 04/13/2018   Procedure: LAPAROSCOPY DIAGNOSTIC;  Surgeon: [name], MD;  Location[facility];  Service: General;  Laterality: N/A;   LAPAROSCOPY N/A 10/02/2018   Procedure: LAPAROSCOPY DIAGNOSTIC;  Surgeon: [name], MD;  Location: [place];  Service: General;  Laterality: N/A;   MASTECTOMY Right 2007   PORT-A-CATH REMOVAL Left 11/07/2020   Procedure: PORT REMOVAL;  Surgeon: [name], MD;  Location: [place];  Service: General;  Laterality:  Left;   PORTACATH PLACEMENT Left 04/13/2018   Procedure: INSERTION PORT-A-CATH ERAS PATHWAY;  Surgeon: [name], MD;  Location[facility];  Service: General;  Laterality: Left;  subclavian   UPPER GASTROINTESTINAL ENDOSCOPY     Family History  Problem Relation Age of Onset   Liver cancer Father    Cancer Paternal Uncle        unknown type cancer   Cirrhosis Mother    Colon cancer Neg Hx    Esophageal cancer Neg Hx    Rectal cancer Neg Hx    Stomach cancer Neg Hx    Social History   Socioeconomic History   Marital status: Married    Spouse name: Kenda Kloehn   Number of children: 2   Years of education: 11   Highest education level: 11th grade  Occupational History   Occupation: Physiological scientist  Comment: Disabled  Tobacco Use   Smoking status: Former    Packs/day: 0.50    Years: 15.00    Total pack years: 7.50    Types: Cigarettes    Quit date: 04/27/1994    Years since quitting: 27.3   Smokeless tobacco: Never  Vaping Use   Vaping Use: Never used  Substance and Sexual Activity   Alcohol use: No    Alcohol/week: 0.0 standard drinks of alcohol   Drug use: No   Sexual activity: Never    Partners: Male  Other Topics Concern   Not on file  Social History Narrative   Lives with her husband. She enjoys reading and watching television.   Social Determinants of Health   Financial Resource Strain: Low Risk  (08/28/2021)   Overall Financial Resource Strain (CARDIA)    Difficulty of Paying Living Expenses: Not hard at all  Food Insecurity: No Food Insecurity (08/28/2021)   Hunger Vital Sign    Worried About Running Out of Food in the Last Year: Never true    Ran Out of Food in the Last Year: Never true  Transportation Needs: No Transportation Needs (08/28/2021)   PRAPARE - Hydrologist (Medical): No    Lack of Transportation (Non-Medical): No  Physical Activity: Inactive (08/28/2021)   Exercise Vital Sign    Days of Exercise per Week:  0 days    Minutes of Exercise per Session: 0 min  Stress: Stress Concern Present (08/28/2021)   [place]    Feeling of Stress : Rather much  Social Connections: Socially Isolated (08/28/2021)   Social Connection and Isolation Panel [NHANES]    Frequency of Communication with Friends and Family: Once a week    Frequency of Social Gatherings with Friends and Family: Once a week    Attends Religious Services: Never    Marine scientist or Organizations: No    Attends Archivist Meetings: Never    Marital Status: Married    Activities of Daily Living    08/28/2021   10:14 AM 11/07/2020   11:48 AM  In your present state of health, do you have any difficulty performing the following activities:  Hearing? 0 0  Vision? 0 0  Difficulty concentrating or making decisions? 0 0  Walking or climbing stairs? 0 0  Dressing or bathing? 0 0  Doing errands, shopping? 0   Preparing Food and eating ? Y   Comment eating-due to having her stomach removed.   Using the Toilet? N   In the past six months, have you accidently leaked urine? N   Do you have problems with loss of bowel control? N   Managing your Medications? N   Managing your Finances? N   Housekeeping or managing your Housekeeping? N     Patient Education/ Literacy How often do you need to have someone help you when you read instructions, pamphlets, or other written materials from your doctor or pharmacy?: 1 - Never What is the last grade level you completed in school?: 11th grade  Exercise Current Exercise Habits: The patient does not participate in regular exercise at present, Exercise limited by: respiratory conditions(s);Other - see comments (weakness)  Diet Patient reports consuming 1 meals a day and 2 snack(s) a day Patient reports that her primary diet is:  bland Patient reports that she does have regular access to food.   Depression Screen     08/28/2021  10:09 AM 04/17/2021    1:21 PM 12/25/2018   10:12 AM 09/16/2017    3:28 PM 09/24/2016    8:11 AM  PHQ 2/9 Scores  PHQ - 2 Score 0 0 0 0 0  PHQ- 9 Score    4      Fall Risk    08/28/2021   10:09 AM  [place] in the past year? 0  Number falls in past yr: 0  Injury with Fall? 0  Risk for fall due to : No Fall Risks  Follow up Falls evaluation completed     Objective:  Mariah Kemp seemed alert and oriented and she participated appropriately during our telephone visit.  Blood Pressure Weight BMI  BP Readings from Last 3 Encounters:  07/23/21 139/79  04/17/21 111/70  04/04/21 120/74   Wt Readings from Last 3 Encounters:  07/23/21 126 lb 14.4 oz (57.6 kg)  04/17/21 122 lb 3.2 oz (55.4 kg)  04/04/21 122 lb 6.4 oz (55.5 kg)   BMI Readings from Last 1 Encounters:  07/23/21 24.78 kg/m    *Unable to obtain current vital signs, weight, and BMI due to telephone visit type  Hearing/Vision  Mariah Kemp did not seem to have difficulty with hearing/understanding during the telephone conversation Reports that she has not had a formal eye exam by an eye care professional within the past year Reports that she has not had a formal hearing evaluation within the past year *Unable to fully assess hearing and vision during telephone visit type  Cognitive Function:    08/28/2021   10:16 AM  6CIT Screen  What Year? 0 points  What month? 0 points  What time? 0 points  Count back from 20 0 points  Months in reverse 0 points  Repeat phrase 0 points  Total Score 0 points   (Normal:0-7, Significant for Dysfunction: >8)  Normal Cognitive Function Screening: Yes   Immunization & Health Maintenance Record Immunization History  Administered Date(s) Administered   PFIZER(Purple Top)SARS-COV-2 Vaccination 06/10/2019, 07/05/2019    Health Maintenance  Topic Date Due   Zoster Vaccines- Shingrix (1 of 2) Never done   COVID-19 Vaccine (3 - Pfizer risk series) 08/02/2019    MAMMOGRAM  02/02/2021   Hepatitis C Screening  03/19/2023 (Originally 01/13/1979)   INFLUENZA VACCINE  01/27/2030 (Originally 10/16/2021)   TETANUS/TDAP  08/19/2034 (Originally [birth year]12/1981)   COLONOSCOPY (Pts 45-9yr Insurance coverage will need to be confirmed)  03/29/2028   HIV Screening  Completed   HPV VACCINES  Aged Out       Assessment  This is a routine wellness examination for Mariah Kemp  Health Maintenance: Due or Overdue Health Maintenance Due  Topic Date Due   Zoster Vaccines- Shingrix (1 of 2) Never done   COVID-19 Vaccine (3 - Pfizer risk series) 08/02/2019   MAMMOGRAM  02/02/2021    Mariah Kemp not need a referral for Community Assistance: Care Management:   no Social Work:    no Prescription Assistance:  no Nutrition/Diabetes Education:  no   Plan:  Personalized Goals  Goals Addressed               This Visit's Progress     Patient Stated (pt-stated)        Build her strength       Personalized Health Maintenance & Screening Recommendations  Td vaccine Screening mammography Shingrix vaccine  Patient declined the vaccines at this time.   Lung Cancer Screening Recommended:  no (Low Dose CT Chest recommended if Age 12-80 years, 30 pack-year currently smoking OR have quit w/in past 15 years) Hepatitis C Screening recommended: yes HIV Screening recommended: no  Advanced Directives: Written information was not prepared per patient's request.  Referrals & Orders No orders of the defined types were placed in this encounter.   Follow-up Plan Follow-up with Samuel Bouche, NP as planned Medicare wellness visit in one year. Patient will access AVS on my chart.   I have personally reviewed and noted the following in the patient's chart:   Medical and social history Use of alcohol, tobacco or illicit drugs  Current medications and supplements Functional ability and status Nutritional status Physical activity Advanced  directives List of other physicians Hospitalizations, surgeries, and ER visits in previous 12 months Vitals Screenings to include cognitive, depression, and falls Referrals and appointments  In addition, I have reviewed and discussed with Mariah Kemp certain preventive protocols, quality metrics, and best practice recommendations. A written personalized care plan for preventive services as well as general preventive health recommendations is available and can be mailed to the patient at her request.      Tinnie Gens, RN-BSN  08/28/2021

## 2021-08-28 NOTE — Patient Instructions (Addendum)
[facility] Maintenance Summary and Written Plan of Care  Mariah Kemp ,  Thank you for allowing me to perform your Medicare Annual Wellness Visit and for your ongoing commitment to your health.   Health Maintenance & Immunization History Health Maintenance  Topic Date Due  . COVID-19 Vaccine (3 - Pfizer risk series) 09/13/2021 (Originally 08/02/2019)  . Zoster Vaccines- Shingrix (1 of 2) 11/28/2021 (Originally 01/12/1980)  . MAMMOGRAM  08/29/2022 (Originally 02/02/2021)  . Hepatitis C Screening  03/19/2023 (Originally 01/13/1979)  . INFLUENZA VACCINE  01/27/2030 (Originally 10/16/2021)  . TETANUS/TDAP  08/19/2034 (Originally 01/12/1981)  . COLONOSCOPY (Pts 45-51yr Insurance coverage will need to be confirmed)  03/29/2028  . HIV Screening  Completed  . HPV VACCINES  Aged Out   Immunization History  Administered Date(s) Administered  . PFIZER(Purple Top)SARS-COV-2 Vaccination 06/10/2019, 07/05/2019    These are the patient goals that we discussed:  Goals Addressed              This Visit's Progress   .  Patient Stated (pt-stated)        Build her strength        This is a list of Health Maintenance Items that are overdue or due now: Td vaccine Screening mammography Shingrix vaccine  Patient declined the vaccines at this time.   Orders/Referrals Placed Today: Orders Placed This Encounter  Procedures  . Mammogram 3D SCREEN BREAST BILATERAL    Standing Status:   Future    Standing Expiration Date:   08/29/2022    Scheduling Instructions:     Please call the patient to schedule.    Order Specific Question:   Reason for Exam (SYMPTOM  OR DIAGNOSIS REQUIRED)    Answer:   Breast cancer screening    Order Specific Question:   Is the patient pregnant?    Answer:   No    Order Specific Question:   Preferred imaging location?    Answer:   [facility]   (Contact our referral department at [phone] you have not spoken with  someone about your referral appointment within the next 5 days)    Follow-up Plan Follow-up with JSamuel Bouche NP as planned Medicare wellness visit in one year. Patient will access AVS on my chart.      Health Maintenance, Female Adopting a healthy lifestyle and getting preventive care are important in promoting health and wellness. Ask your health care provider about: The right schedule for you to have regular tests and exams. Things you can do on your own to prevent diseases and keep yourself healthy. What should I know about diet, weight, and exercise? Eat a healthy diet  Eat a diet that includes plenty of vegetables, fruits, low-fat dairy products, and lean protein. Do not eat a lot of foods that are high in solid fats, added sugars, or sodium. Maintain a healthy weight Body mass index (BMI) is used to identify weight problems. It estimates body fat based on height and weight. Your health care provider can help determine your BMI and help you achieve or maintain a healthy weight. Get regular exercise Get regular exercise. This is one of the most important things you can do for your health. Most adults should: Exercise for at least 150 minutes each week. The exercise should increase your heart rate and make you sweat (moderate-intensity exercise). Do strengthening exercises at least twice a week. This is in addition to the moderate-intensity exercise. Spend less time sitting. Even light physical  activity can be beneficial. Watch cholesterol and blood lipids Have your blood tested for lipids and cholesterol at 60 years of age, then have this test every 5 years. Have your cholesterol levels checked more often if: Your lipid or cholesterol levels are high. You are older than 60 years of age. You are at high risk for heart disease. What should I know about cancer screening? Depending on your health history and family history, you may need to have cancer screening at various ages.  This may include screening for: Breast cancer. Cervical cancer. Colorectal cancer. Skin cancer. Lung cancer. What should I know about heart disease, diabetes, and high blood pressure? Blood pressure and heart disease High blood pressure causes heart disease and increases the risk of stroke. This is more likely to develop in people who have high blood pressure readings or are overweight. Have your blood pressure checked: Every 3-5 years if you are 20-43 years of age. Every year if you are 54 years old or older. Diabetes Have regular diabetes screenings. This checks your fasting blood sugar level. Have the screening done: Once every three years after age 39 if you are at a normal weight and have a low risk for diabetes. More often and at a younger age if you are overweight or have a high risk for diabetes. What should I know about preventing infection? Hepatitis B If you have a higher risk for hepatitis B, you should be screened for this virus. Talk with your health care provider to find out if you are at risk for hepatitis B infection. Hepatitis C Testing is recommended for: Everyone born from [birth year] through 1964. Anyone with known risk factors for hepatitis C. Sexually transmitted infections (STIs) Get screened for STIs, including gonorrhea and chlamydia, if: You are sexually active and are younger than 60 years of age. You are older than 60 years of age and your health care provider tells you that you are at risk for this type of infection. Your sexual activity has changed since you were last screened, and you are at increased risk for chlamydia or gonorrhea. Ask your health care provider if you are at risk. Ask your health care provider about whether you are at high risk for HIV. Your health care provider may recommend a prescription medicine to help prevent HIV infection. If you choose to take medicine to prevent HIV, you should first get tested for HIV. You should then be tested every 3  months for as long as you are taking the medicine. Pregnancy If you are about to stop having your period (premenopausal) and you may become pregnant, seek counseling before you get pregnant. Take 400 to 800 micrograms (mcg) of folic acid every day if you become pregnant. Ask for birth control (contraception) if you want to prevent pregnancy. Osteoporosis and menopause Osteoporosis is a disease in which the bones lose minerals and strength with aging. This can result in bone fractures. If you are 61 years old or older, or if you are at risk for osteoporosis and fractures, ask your health care provider if you should: Be screened for bone loss. Take a calcium or vitamin D supplement to lower your risk of fractures. Be given hormone replacement therapy (HRT) to treat symptoms of menopause. Follow these instructions at home: Alcohol use Do not drink alcohol if: Your health care provider tells you not to drink. You are pregnant, may be pregnant, or are planning to become pregnant. If you drink alcohol: Limit how much you have  to: 0-1 drink a day. Know how much alcohol is in your drink. In the [place] one drink equals one 12 oz bottle of beer (355 mL), one 5 oz glass of wine (148 mL), or one 1 oz glass of hard liquor (44 mL). Lifestyle Do not use any products that contain nicotine or tobacco. These products include cigarettes, chewing tobacco, and vaping devices, such as e-cigarettes. If you need help quitting, ask your health care provider. Do not use street drugs. Do not share needles. Ask your health care provider for help if you need support or information about quitting drugs. General instructions Schedule regular health, dental, and eye exams. Stay current with your vaccines. Tell your health care provider if: You often feel depressed. You have ever been abused or do not feel safe at home. Summary Adopting a healthy lifestyle and getting preventive care are important in promoting health  and wellness. Follow your health care provider's instructions about healthy diet, exercising, and getting tested or screened for diseases. Follow your health care provider's instructions on monitoring your cholesterol and blood pressure. This information is not intended to replace advice given to you by your health care provider. Make sure you discuss any questions you have with your health care provider. Document Revised: 07/24/2020 Document Reviewed: 07/24/2020 Elsevier Patient Education  [place].

## 2021-09-11 ENCOUNTER — Other Ambulatory Visit: Payer: Self-pay | Admitting: Hematology

## 2021-10-05 ENCOUNTER — Encounter (INDEPENDENT_AMBULATORY_CARE_PROVIDER_SITE_OTHER): Payer: Medicare Other | Admitting: Internal Medicine

## 2021-10-05 ENCOUNTER — Encounter: Payer: Self-pay | Admitting: Hematology

## 2021-10-05 DIAGNOSIS — E039 Hypothyroidism, unspecified: Secondary | ICD-10-CM

## 2021-10-05 NOTE — Telephone Encounter (Signed)

## 2021-10-09 ENCOUNTER — Other Ambulatory Visit (INDEPENDENT_AMBULATORY_CARE_PROVIDER_SITE_OTHER): Payer: Medicare Other

## 2021-10-09 DIAGNOSIS — E039 Hypothyroidism, unspecified: Secondary | ICD-10-CM

## 2021-10-09 LAB — T4, FREE: Free T4: 0.61 ng/dL (ref 0.60–1.60)

## 2021-10-09 LAB — TSH: TSH: 48.75 u[IU]/mL — ABNORMAL HIGH (ref 0.35–5.50)

## 2021-10-09 MED ORDER — LEVOTHYROXINE SODIUM 200 MCG PO TABS: 200.0000 ug | ORAL_TABLET | Freq: Every day | ORAL | 3 refills | Status: DC

## 2021-10-11 NOTE — Progress Notes (Signed)
Established Patient Office Visit  Subjective   Patient ID: Mariah Kemp, female   DOB: May 19, 1960 Age: 61 y.o. MRN: [id number]   Chief Complaint  Patient presents with   Follow-up   Hypertension    HPI Pleasant 61 year old female presenting today for the following:  Hypertension: Low sodium diet. No current medications. Has some difficulty with dietary behaviors.   Hyperlipidemia: Not currently taking any medications for high cholesterol.  Last cholesterol was checked 2 years ago with an elevated total cholesterol of 240.  She is working to follow a low-fat diet but notes that she often eats too much even though she knows she should not.  Chronic bronchitis: Using Advair twice daily as scheduled.  Has albuterol for as needed use but this is very sparingly needed.  Feels her condition is very stable and does not currently have any concerning symptoms.  Mood: struggling with some depression.  Her husband got a diagnosis of an early onset rare type of Alzheimer's last week and she is working to process that information and what the future may hold for them.  Having difficulty talking about it at this point and has not done any information seeking about the condition.  She is not currently taking any medications for mood management and is not currently doing counseling.   Objective:    Vitals:   10/12/21 1422  BP: 122/75  Pulse: 87  Resp: 20  Height: 5' (1.524 m)  Weight: 129 lb 9.6 oz (58.8 kg)  SpO2: 97%  BMI (Calculated): 25.31    Physical Exam Vitals and nursing note reviewed.  Constitutional:      General: She is not in acute distress.    Appearance: Normal appearance. She is not ill-appearing.  HENT:     Head: Normocephalic and atraumatic.  Cardiovascular:     Rate and Rhythm: Normal rate and regular rhythm.     Pulses: Normal pulses.     Heart sounds: Normal heart sounds.  Pulmonary:     Effort: Pulmonary effort is normal. No respiratory distress.     Breath  sounds: Normal breath sounds. No wheezing, rhonchi or rales.  Skin:    General: Skin is warm and dry.  Neurological:     Mental Status: She is alert and oriented to person, place, and time.  Psychiatric:        Mood and Affect: Mood normal.        Behavior: Behavior normal.        Thought Content: Thought content normal.        Judgment: Judgment normal.   No results found for this or any previous visit (from the past 24 hour(s)).     The 10-year ASCVD risk score (Arnett DK, et al., 2019) is: 4.3%   Values used to calculate the score:     Age: 61 years     Sex: Female     Is Non-Hispanic African American: No     Diabetic: No     Tobacco smoker: No     Systolic Blood Pressure: 833 mmHg     Is BP treated: No     HDL Cholesterol: 36 mg/dL     Total Cholesterol: 240 mg/dL   Assessment & Plan:   1. Essential hypertension Blood pressure well controlled with diet and lifestyle modifications.  Continue low-sodium diet.  2. Hypertriglyceridemia 3. Mixed hyperlipidemia Checking lipid panel today.  Continue low-fat diet. - Lipid panel  4. Simple chronic bronchitis (HCC) Stable.  Continue  Advair as scheduled.  Continue albuterol as needed  5. Emotional stress Understandable stress regarding her husband's recent health challenges.  Very tearful during discussion of this so we did not go into full detail per her request.  Advised patient she should reach out to [place] if she feels that she needs counseling or medication support during this difficult time.  Patient verbalized understanding is agreeable to the plan.   No follow-ups on file.  ___________________________________________ Clearnce Sorrel, DNP, APRN, FNP-BC [facility] [place]

## 2021-10-12 ENCOUNTER — Encounter: Payer: Self-pay | Admitting: Medical-Surgical

## 2021-10-12 ENCOUNTER — Ambulatory Visit (INDEPENDENT_AMBULATORY_CARE_PROVIDER_SITE_OTHER): Payer: Medicare Other | Admitting: Medical-Surgical

## 2021-10-12 VITALS — BP 122/75 | HR 87 | Resp 20 | Ht 60.0 in | Wt 129.6 lb

## 2021-10-12 DIAGNOSIS — J410 Simple chronic bronchitis: Secondary | ICD-10-CM

## 2021-10-12 DIAGNOSIS — E782 Mixed hyperlipidemia: Secondary | ICD-10-CM

## 2021-10-12 DIAGNOSIS — R457 State of emotional shock and stress, unspecified: Secondary | ICD-10-CM | POA: Insufficient documentation

## 2021-10-12 DIAGNOSIS — I10 Essential (primary) hypertension: Secondary | ICD-10-CM

## 2021-10-12 DIAGNOSIS — E781 Pure hyperglyceridemia: Secondary | ICD-10-CM

## 2021-10-16 ENCOUNTER — Other Ambulatory Visit: Payer: Self-pay | Admitting: Medical-Surgical

## 2021-10-16 LAB — LIPID PANEL
Cholesterol: 226 mg/dL — ABNORMAL HIGH (ref <200)
HDL: 47 mg/dL — ABNORMAL LOW (ref >=50)
LDL Cholesterol (Calc): 156 mg/dL (calc) — ABNORMAL HIGH
Non-HDL Cholesterol (Calc): 179 mg/dL (calc) — ABNORMAL HIGH (ref <130)
Total CHOL/HDL Ratio: 4.8 (calc) (ref <5.0)
Triglycerides: 116 mg/dL (ref <150)

## 2021-10-16 MED ORDER — ATORVASTATIN CALCIUM 10 MG PO TABS: 10.0000 mg | ORAL_TABLET | Freq: Every day | ORAL | 3 refills | Status: DC

## 2021-10-16 NOTE — Addendum Note (Signed)
Addended bySamuel Bouche on: 10/16/2021 05:32 PM   Modules accepted: Orders

## 2021-10-17 ENCOUNTER — Ambulatory Visit: Payer: Medicare Other

## 2021-10-17 ENCOUNTER — Telehealth: Payer: Self-pay

## 2021-10-17 NOTE — Telephone Encounter (Signed)
Patient has been scheduled for 12/11/21. AMUCK

## 2021-10-18 ENCOUNTER — Ambulatory Visit (INDEPENDENT_AMBULATORY_CARE_PROVIDER_SITE_OTHER): Payer: Medicare Other

## 2021-10-18 DIAGNOSIS — Z1231 Encounter for screening mammogram for malignant neoplasm of breast: Secondary | ICD-10-CM | POA: Diagnosis not present

## 2021-10-18 DIAGNOSIS — Z0000 Encounter for general adult medical examination without abnormal findings: Secondary | ICD-10-CM

## 2021-12-05 ENCOUNTER — Encounter: Payer: Self-pay | Admitting: Internal Medicine

## 2021-12-05 ENCOUNTER — Ambulatory Visit (INDEPENDENT_AMBULATORY_CARE_PROVIDER_SITE_OTHER): Payer: Medicare Other | Admitting: Internal Medicine

## 2021-12-05 VITALS — BP 116/74 | HR 75 | Ht 60.0 in | Wt 125.4 lb

## 2021-12-05 DIAGNOSIS — E039 Hypothyroidism, unspecified: Secondary | ICD-10-CM

## 2021-12-05 LAB — T4, FREE: Free T4: 0.92 ng/dL (ref 0.60–1.60)

## 2021-12-05 LAB — TSH: TSH: 4.69 u[IU]/mL (ref 0.35–5.50)

## 2021-12-05 NOTE — Progress Notes (Unsigned)
Name: Mariah Kemp  MRN/ DOB: [id number], 1961/02/05    Age/ Sex: 61 y.o., female     PCP: Samuel Bouche, NP   Reason for Endocrinology Evaluation: Hypothyroidism     Initial Endocrinology Clinic Visit: 07/19/2020    PATIENT IDENTIFIER: Mariah Kemp is a 61 y.o., female with a past medical history of Hx of breast Ca ( S/P right mastectomy ) , Hx of gastric cancer HTn and hypothyroidism. She has followed with [facility] since 07/19/2020 for consultative assistance with management of her Hypothyroidism.   HISTORICAL SUMMARY:  She has been diagnosed with gastric cancer in 2020. S/P chemo from 1-07/2018 followed by total gastrectomy 09/2018.  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      She has been diagnosed with Hypothyroidism ~ 30 yrs ago . She has been on LT-4 for years.    No FH of thyroid disease  SUBJECTIVE:    Today (12/05/2021):  Mariah Kemp is here for hypothyroidism.  Weight continues to fluctuate  Hair loss has improved   Denies constipation or diarrhea  Denies local neck symptoms  Denies palpitations  Denies palpitations  She is cold intolerance    Levothyroxine 200 mcg daily     HISTORY:  Past Medical History:  Past Medical History:  Diagnosis Date   Anemia    Asthma    Blood transfusion without reported diagnosis    Breast cancer ([place])    Dyspnea    over exertion   Hypertension    Hypothyroidism    Stomach cancer [facility])    Past Surgical History:  Past Surgical History:  Procedure Laterality Date   ABDOMINAL HYSTERECTOMY  1996   BARIATRIC SURGERY     stomach removal   BILATERAL SALPINGOOPHORECTOMY  2007   BIOPSY  03/29/2018   Procedure: BIOPSY;  Surgeon: Wilford Corner, MD;  Location: [place];  Service: Endoscopy;;   CARPAL TUNNEL RELEASE     COLONOSCOPY N/A 03/29/2018   Procedure: COLONOSCOPY;  Surgeon: Wilford Corner, MD;  Location: [place];  Service: Endoscopy;  Laterality: N/A;   COLONOSCOPY      ESOPHAGOGASTRODUODENOSCOPY N/A 03/29/2018   Procedure: ESOPHAGOGASTRODUODENOSCOPY (EGD);  Surgeon: Wilford Corner, MD;  Location: [place];  Service: Endoscopy;  Laterality: N/A;   GASTRECTOMY N/A 10/02/2018   Procedure: TOTAL GASTRECTOMY;  Surgeon: [name], MD;  Location: [place];  Service: General;  Laterality: N/A;   GASTROSTOMY N/A 10/02/2018   Procedure: FEEDING TUBE PLACEMENT;  Surgeon: [name], MD;  Location: [place];  Service: General;  Laterality: N/A;   LAPAROSCOPY N/A 04/13/2018   Procedure: LAPAROSCOPY DIAGNOSTIC;  Surgeon: [name], MD;  Location: [facility];  Service: General;  Laterality: N/A;   LAPAROSCOPY N/A 10/02/2018   Procedure: LAPAROSCOPY DIAGNOSTIC;  Surgeon: [name], MD;  Location: [place];  Service: General;  Laterality: N/A;   MASTECTOMY Right 2007   PORT-A-CATH REMOVAL Left 11/07/2020   Procedure: PORT REMOVAL;  Surgeon: [name], MD;  Location: [place];  Service: General;  Laterality: Left;   PORTACATH PLACEMENT Left 04/13/2018   Procedure: [place];  Surgeon: [name], MD;  Location: [facility];  Service: General;  Laterality: Left;  subclavian   UPPER GASTROINTESTINAL ENDOSCOPY     Social History:  reports that she quit smoking about 27 years ago. Her smoking use included cigarettes. She has a 7.50 pack-year smoking history. She has never used smokeless tobacco. She reports that she does not drink alcohol and does not use drugs.  Family History:  Family History  Problem Relation Age of Onset   Liver cancer Father    Cancer Paternal Uncle        unknown type cancer   Cirrhosis Mother    Colon cancer Neg Hx    Esophageal cancer Neg Hx    Rectal cancer Neg Hx    Stomach cancer Neg Hx      HOME MEDICATIONS: Allergies as of 12/05/2021   No Known Allergies      Medication List        Accurate as of December 05, 2021  9:24 AM. If you have any questions, ask your nurse or doctor.          acyclovir  400 MG tablet Commonly known as: Zovirax Take 1 tablet (400 mg total) by mouth 3 (three) times daily as needed. For five days. As needed for cold sore.   Advair HFA 115-21 MCG/ACT inhaler Generic drug: fluticasone-salmeterol Inhale 2 puffs into the lungs 2 (two) times daily.   albuterol 108 (90 Base) MCG/ACT inhaler Commonly known as: VENTOLIN HFA Inhale 1-2 puffs into the lungs every 4 (four) hours as needed for wheezing or shortness of breath.   atorvastatin 10 MG tablet Commonly known as: LIPITOR Take 1 tablet (10 mg total) by mouth daily.   B-D 3CC LUER-LOK SYR 25GX1" 25G X 1" 3 ML Misc Generic drug: SYRINGE-NEEDLE (DISP) 3 ML USE AS DIRECTED ONCE A WEEK   calcium carbonate 500 MG chewable tablet Commonly known as: TUMS - dosed in mg elemental calcium Chew 1 tablet (200 mg of elemental calcium total) by mouth 3 (three) times daily.   cyanocobalamin 1000 MCG/ML injection Commonly known as: VITAMIN B12 INJECT 1 ML (1,000 MCG TOTAL) INTO THE MUSCLE ONCE A WEEK.   levothyroxine 200 MCG tablet Commonly known as: SYNTHROID Take 1 tablet (200 mcg total) by mouth daily.   loperamide 2 MG capsule Commonly known as: IMODIUM Take 1 capsule (2 mg total) by mouth 2 (two) times daily as needed for diarrhea or loose stools.   Needles & Syringes Misc 25 ga x 1 " IM needle/syringe   Potassium 99 MG Tabs Take 99 mg by mouth daily.   scopolamine 1 MG/3DAYS Commonly known as: TRANSDERM-SCOP Place 1 patch (1.5 mg total) onto the skin every 3 (three) days.          OBJECTIVE:   PHYSICAL EXAM: VS: There were no vitals taken for this visit.   EXAM: General: Pt appears well and is in NAD  Neck: General: Supple without adenopathy. Thyroid: Thyroid size normal.  No goiter or nodules appreciated.  Lungs: Clear with good BS bilat with no rales, rhonchi, or wheezes  Heart: Auscultation: RRR.  Abdomen: Normoactive bowel sounds, soft, nontender, without masses or organomegaly  palpable  Extremities:  BL LE: No pretibial edema normal ROM and strength.  Mental Status: Judgment, insight: Intact Memory: Intact for recent and remote events Mood and affect: No depression, anxiety, or agitation     DATA REVIEWED:   Latest Reference Range & Units 04/04/21 13:41  TSH 0.35 - 5.50 uIU/mL 1.61   ASSESSMENT / PLAN / RECOMMENDATIONS:   Hypothyroidism:   - Pt has not feeling quite right with hair loss and less energetic  - No local neck symptoms  - She is compliant with proper LT-4 intake  - I have advised her to start Biotin to see if it would help with hair and nails as long as she remembers to hold  it 2-3 days before any future TFT checks  -TSH has normalized    Medications   Continue Levothyroxine 100 mcg daily    Follow-up in 4 months     Signed electronically by: Mack Guise, MD  [facility]  [address] [place], [place] [address] Phone: [phone] FAX: [phone]      CC: Samuel Bouche, [address] [place] [address] Phone: [phone]  Fax: [phone]   Return to Endocrinology clinic as below: Future Appointments  Date Time Provider [place]  12/05/2021  1:40 PM Keisuke Hollabaugh, Melanie Crazier, MD [facility] None  12/11/2021  9:00 AM [facility] None  01/23/2022 11:15 AM [facility] None  01/23/2022 11:40 AM [name], MD [facility] None  09/03/2022 10:00 AM [facility]K-NURSE HEALTH ADVISOR [facility] None

## 2021-12-06 MED ORDER — LIOTHYRONINE SODIUM 5 MCG PO TABS: 5.0000 ug | ORAL_TABLET | Freq: Every day | ORAL | 2 refills | Status: DC

## 2021-12-06 MED ORDER — LEVOTHYROXINE SODIUM 200 MCG PO TABS: 200.0000 ug | ORAL_TABLET | Freq: Every day | ORAL | 3 refills | Status: DC

## 2021-12-10 ENCOUNTER — Other Ambulatory Visit: Payer: Self-pay | Admitting: Hematology

## 2021-12-11 ENCOUNTER — Other Ambulatory Visit: Payer: Medicare Other

## 2021-12-12 LAB — COMPLETE METABOLIC PANEL WITH GFR
AG Ratio: 1.5 (calc) (ref 1.0–2.5)
ALT: 84 U/L — ABNORMAL HIGH (ref 6–29)
AST: 60 U/L — ABNORMAL HIGH (ref 10–35)
Albumin: 4.3 g/dL (ref 3.6–5.1)
Alkaline phosphatase (APISO): 81 U/L (ref 37–153)
BUN: 12 mg/dL (ref 7–25)
CO2: 30 mmol/L (ref 20–32)
Calcium: 9.6 mg/dL (ref 8.6–10.4)
Chloride: 105 mmol/L (ref 98–110)
Creat: 0.51 mg/dL (ref 0.50–1.03)
Globulin: 2.9 g/dL (calc) (ref 1.9–3.7)
Glucose, Bld: 84 mg/dL (ref 65–99)
Potassium: 3.8 mmol/L (ref 3.5–5.3)
Sodium: 142 mmol/L (ref 135–146)
Total Bilirubin: 0.4 mg/dL (ref 0.2–1.2)
Total Protein: 7.2 g/dL (ref 6.1–8.1)
eGFR: 107 mL/min/{1.73_m2} (ref >=60)

## 2021-12-12 LAB — LIPID PANEL
Cholesterol: 171 mg/dL (ref <200)
HDL: 50 mg/dL (ref >=50)
LDL Cholesterol (Calc): 98 mg/dL (calc)
Non-HDL Cholesterol (Calc): 121 mg/dL (calc) (ref <130)
Total CHOL/HDL Ratio: 3.4 (calc) (ref <5.0)
Triglycerides: 132 mg/dL (ref <150)

## 2022-01-11 ENCOUNTER — Other Ambulatory Visit: Payer: Self-pay

## 2022-01-16 ENCOUNTER — Encounter: Payer: Self-pay | Admitting: Hematology

## 2022-01-23 ENCOUNTER — Encounter: Payer: Self-pay | Admitting: Hematology

## 2022-01-23 ENCOUNTER — Inpatient Hospital Stay (HOSPITAL_BASED_OUTPATIENT_CLINIC_OR_DEPARTMENT_OTHER): Payer: Medicare Other | Admitting: Hematology

## 2022-01-23 ENCOUNTER — Inpatient Hospital Stay: Payer: Medicare Other | Attending: Hematology

## 2022-01-23 ENCOUNTER — Other Ambulatory Visit: Payer: Self-pay

## 2022-01-23 VITALS — BP 145/91 | HR 67 | Temp 98.0°F | Resp 15 | Ht 60.0 in | Wt 125.9 lb

## 2022-01-23 DIAGNOSIS — Z79899 Other long term (current) drug therapy: Secondary | ICD-10-CM | POA: Insufficient documentation

## 2022-01-23 DIAGNOSIS — E538 Deficiency of other specified B group vitamins: Secondary | ICD-10-CM | POA: Insufficient documentation

## 2022-01-23 DIAGNOSIS — C162 Malignant neoplasm of body of stomach: Secondary | ICD-10-CM | POA: Diagnosis not present

## 2022-01-23 DIAGNOSIS — Z853 Personal history of malignant neoplasm of breast: Secondary | ICD-10-CM | POA: Diagnosis not present

## 2022-01-23 DIAGNOSIS — C169 Malignant neoplasm of stomach, unspecified: Secondary | ICD-10-CM | POA: Insufficient documentation

## 2022-01-23 DIAGNOSIS — I10 Essential (primary) hypertension: Secondary | ICD-10-CM | POA: Insufficient documentation

## 2022-01-23 DIAGNOSIS — D649 Anemia, unspecified: Secondary | ICD-10-CM | POA: Insufficient documentation

## 2022-01-23 DIAGNOSIS — E039 Hypothyroidism, unspecified: Secondary | ICD-10-CM | POA: Insufficient documentation

## 2022-01-23 DIAGNOSIS — Z903 Acquired absence of stomach [part of]: Secondary | ICD-10-CM | POA: Insufficient documentation

## 2022-01-23 DIAGNOSIS — Z9011 Acquired absence of right breast and nipple: Secondary | ICD-10-CM | POA: Diagnosis not present

## 2022-01-23 DIAGNOSIS — D500 Iron deficiency anemia secondary to blood loss (chronic): Secondary | ICD-10-CM

## 2022-01-23 LAB — CBC WITH DIFFERENTIAL (CANCER CENTER ONLY)
Abs Immature Granulocytes: 0.01 10*3/uL (ref 0.00–0.07)
Basophils Absolute: 0.1 10*3/uL (ref 0.0–0.1)
Basophils Relative: 1 %
Eosinophils Absolute: 0.6 10*3/uL — ABNORMAL HIGH (ref 0.0–0.5)
Eosinophils Relative: 11 %
HCT: 35.7 % — ABNORMAL LOW (ref 36.0–46.0)
Hemoglobin: 12.2 g/dL (ref 12.0–15.0)
Immature Granulocytes: 0 %
Lymphocytes Relative: 32 %
Lymphs Abs: 1.8 10*3/uL (ref 0.7–4.0)
MCH: 32.6 pg (ref 26.0–34.0)
MCHC: 34.2 g/dL (ref 30.0–36.0)
MCV: 95.5 fL (ref 80.0–100.0)
Monocytes Absolute: 0.5 10*3/uL (ref 0.1–1.0)
Monocytes Relative: 9 %
Neutro Abs: 2.6 10*3/uL (ref 1.7–7.7)
Neutrophils Relative %: 47 %
Platelet Count: 173 10*3/uL (ref 150–400)
RBC: 3.74 MIL/uL — ABNORMAL LOW (ref 3.87–5.11)
RDW: 12.5 % (ref 11.5–15.5)
WBC Count: 5.6 10*3/uL (ref 4.0–10.5)
nRBC: 0 % (ref 0.0–0.2)

## 2022-01-23 LAB — CMP (CANCER CENTER ONLY)
ALT: 37 U/L (ref 0–44)
AST: 30 U/L (ref 15–41)
Albumin: 4.2 g/dL (ref 3.5–5.0)
Alkaline Phosphatase: 79 U/L (ref 38–126)
Anion gap: 6 (ref 5–15)
BUN: 12 mg/dL (ref 6–20)
CO2: 29 mmol/L (ref 22–32)
Calcium: 9.6 mg/dL (ref 8.9–10.3)
Chloride: 107 mmol/L (ref 98–111)
Creatinine: 0.62 mg/dL (ref 0.44–1.00)
GFR, Estimated: >60 mL/min (ref >60)
Glucose, Bld: 59 mg/dL — ABNORMAL LOW (ref 70–99)
Potassium: 3.7 mmol/L (ref 3.5–5.1)
Sodium: 142 mmol/L (ref 135–145)
Total Bilirubin: 0.3 mg/dL (ref 0.3–1.2)
Total Protein: 7.6 g/dL (ref 6.5–8.1)

## 2022-01-23 LAB — VITAMIN B12: Vitamin B-12: 634 pg/mL (ref 180–914)

## 2022-01-23 LAB — FERRITIN: Ferritin: 508 ng/mL — ABNORMAL HIGH (ref 11–307)

## 2022-01-23 MED ORDER — "BD LUER-LOK SYRINGE 25G X 1"" 3 ML MISC": 2 refills | Status: DC

## 2022-01-23 MED ORDER — CYANOCOBALAMIN 1000 MCG/ML IJ SOLN: 1000.0000 ug | INTRAMUSCULAR | 4 refills | Status: DC

## 2022-01-23 NOTE — Progress Notes (Signed)
[name]   [phone] [phone]   Clinic Follow up Note   Patient Care Team: Samuel Bouche, NP as PCP - General (Nurse Practitioner) [name], MD as Consulting Physician (General Surgery) [name], MD as Consulting Physician (Hematology) Alla Feeling, NP as Nurse Practitioner (Nurse Practitioner)  Date of Service:  01/23/2022  CHIEF COMPLAINT: f/u of gastric cancer  CURRENT THERAPY:  Surveillance  ASSESSMENT & PLAN:  Mariah Kemp is a 61 y.o. female with   1. 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 S/p 8 cycles of neoadjuvant FLOT4, then total Gastrectomy and lymph node dissection on 10/02/18. She had complete pathologic response with all nodes negative.  -Given her pCR, she is currently on surveillance.  -surveillance CT CAP from 07/16/21 showed NED. -she is clinically doing well. Labs reviewed, CBC overall WNL, other labs pending.  -She is coming up on 4 years out, risk of recurrence is much less. Plan to complete 5 years surveillance. -f/u in 03/2023 for last visit    2. Anemia, B12 deficiency S/p gastric surgery  -Given 3u blood transfusion on 04/01/18 and IV Feraheme in 03/2018 and  2-05/2019. -Started monthly B12 injections in 01/2019. Switched to home injections after 09/03/19, now every week.  -ferritin remains elevated, B12 WNL on 07/16/21 -Hg now WNL   3. H/o stage 3 right breast cancer in 2007 -Treated with right mastectomy, reconstruction with 3 LN removed, chemotherapy for 5 months (likely AC-T), and Tamoxifen for at least 5 years.  -most recent mammogram 10/18/21 was negative.   4. HTN, hypothyroidism, Health maintenance  -Continue to follow-up with PCP, endocrinology -Her 03/2018 Colonoscopy was overall normal and benign. Recommended to repeat 10 years later.      PLAN: -continue B12 injection weekly at home -lab and f/u in 03/2023   No problem-specific  Assessment & Plan notes found for this encounter.   SUMMARY OF ONCOLOGIC HISTORY: Oncology History Overview Note  Cancer Staging Gastric cancer [facility]) Staging form: Stomach, AJCC 8th Edition - Clinical stage from 03/29/2018: Stage IVA (cT4b, cN1, cM0) - Signed by [name], MD on 04/03/2018 - Pathologic stage from 10/02/2018: ypT0, pN0, cM0 - Signed by Alla Feeling, NP on 01/19/2019     Gastric cancer ([place])  03/19/2018 Procedure   Upper Endoscopy by Dr. Michail Sermon 03/29/18  IMPRESSION - Normal esophagus. - Z-line regular, 38 cm from the incisors. - Non-obstructing oozing gastric ulcer with pigmented material. Biopsied. - Normal examined duodenum. - Acute gastritis.   03/29/2018 Procedure   Colonoscopy by Dr. Michail Sermon 03/29/18 IMPRESSION - Preparation of the colon was fair. - Internal hemorrhoids. - The examined portion of the ileum was normal. - No specimens collected.   03/29/2018 Initial Biopsy   Diagnosis 03/29/18 Stomach, biopsy, Proximal - ADENOCARCINOMA. - GOBLET CELL METAPLASIA. - SEE COMMENT. Microscopic Comment A Warthin Starry stain is negative for the presence of Helicobacter pylori organisms. Dr Vic Ripper has reviewed the case and concurs with this interpretation. Dr Michail Sermon was paged on 03/31/2018. (JBK:ecj 03/31/2018)   03/29/2018 Cancer Staging   Staging form: Stomach, AJCC 8th Edition - Clinical stage from 03/29/2018: Stage IVA (cT4b, cN1, cM0) - Signed by [name], MD on 04/03/2018   04/02/2018 Initial Diagnosis   Gastric cancer ([place])   04/02/2018 Imaging   CT CAP W Contrast 04/02/18 IMPRESSION: 1. Focal thickening lateral wall proximal stomach with protrusion of low-attenuation soft tissue beyond the expected confines of the gastric wall  into the splenic hilum. Imaging features highly concerning for transmural tumor extension into the splenic hilum. Several small nodules in this region are likely lymph nodes, concerning for metastatic disease. 2. Scattered  bilateral pulmonary nodules measuring up to 5 mm. Nonspecific, but close attention on follow-up recommended as metastatic disease not excluded. 3. Small lymph nodes in the gastrohepatic ligament, but there is no gastrohepatic or hepato duodenal ligament lymphadenopathy. No evidence for liver metastases. 4.  Aortic Atherosclerois (ICD10-170.0   04/15/2018 - 07/22/2018 Chemotherapy   FLOT4 every 2 weeks starting 04/15/18. She completed 8 cycles on 07/22/18.   08/21/2018 Imaging   CT CAP W Contrast  IMPRESSION: 1. No evidence of residual gastric mass. No abdominopelvic adenopathy. 2. Similar bilateral pulmonary nodules, indeterminate. 3. Development of upper lung and peribronchovascular predominant pulmonary opacities. Differential considerations include drug toxicity or atypical infection. Lymphangitic tumor spread felt less likely but cannot be excluded. Consider chest CT follow-up at 3-6 months. 4.  Aortic Atherosclerosis (ICD10-I70.0).     10/02/2018 Surgery   LAPAROSCOPY DIAGNOSTIC, TOTAL GASTRECTOMY, FEEDING TUBE PLACEMENT by Dr. 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 - ADENOCARCINOMA IS NOT IDENTIFIED. - THERE IS NO EVIDENCE OF CARCINOMA IN 34 OF 34 LYMPH NODES (0/34). - SEE ONCOLOGY TABLE BELOW.   10/02/2018 Cancer Staging   Staging form: Stomach, AJCC 8th Edition - Pathologic stage from 10/02/2018: ypT0, pN0, cM0 - Signed by Alla Feeling, NP on 01/19/2019   01/20/2019 Survivorship   Per Cira Rue, NP    03/10/2019 Imaging   CT CAP W contrast IMPRESSION: 1. Interval Roux-en-Y esophagojejunostomy with no the definite findings of metastatic disease in the chest, abdomen, or pelvis. 2. Multiple irregular bilateral pulmonary nodules are smaller than on the previous study. This may be infectious/inflammatory etiology but continued close attention  on follow-up recommended. 3. Central mesenteric lymph nodes measure upper normal for size and are slightly increased since the preoperative exam. Attention on follow-up recommended. 4.  Aortic Atherosclerois (ICD10-170.0)   08/31/2019 Imaging   CT CAP w contrast  IMPRESSION: 1. Stable exam. No new or progressive findings in the chest, abdomen, or pelvis. 2. Numerous bilateral irregular nodular opacities in both lungs are stable in the interval. No new suspicious pulmonary nodule or mass. 3. Stable borderline enlarged portocaval lymph node. 4. Aortic Atherosclerosis (ICD10-I70.0).   07/17/2020 Imaging   CT CAP  IMPRESSION: 1. No substantial interval change in exam. No new or progressive findings to suggest recurrent or metastatic disease in the chest, abdomen, or pelvis. 2. Bilateral pulmonary nodules, similar to prior. Given upper lung predominance and interval stability, these are probably infectious/inflammatory. Continued attention on follow-up recommended. 3. Aortic Atherosclerosis (ICD10-I70.0).      INTERVAL HISTORY:  Mariah Kemp is here for a follow up of gastric cancer. She was last seen by me on 07/23/21. She presents to the clinic alone. She reports she is doing well overall. She notes she is adjusting to her "new normal" as far as appetite. She denies any stomach issues or bowel changes.  She notes her PCP is following her lipid panel since these have fluctuated.   All other systems were reviewed with the patient and are negative.  MEDICAL HISTORY:  Past Medical History:  Diagnosis Date   Anemia    Asthma    Blood transfusion without reported diagnosis    Breast cancer ([place])  Dyspnea    over exertion   Hypertension    Hypothyroidism    Stomach cancer [facility])     SURGICAL HISTORY: Past Surgical History:  Procedure Laterality Date   ABDOMINAL HYSTERECTOMY  1996   BARIATRIC SURGERY     stomach removal   BILATERAL SALPINGOOPHORECTOMY  2007   BIOPSY   03/29/2018   Procedure: BIOPSY;  Surgeon: Wilford Corner, MD;  Location: [place];  Service: Endoscopy;;   CARPAL TUNNEL RELEASE     COLONOSCOPY N/A 03/29/2018   Procedure: COLONOSCOPY;  Surgeon: Wilford Corner, MD;  Location: [place];  Service: Endoscopy;  Laterality: N/A;   COLONOSCOPY     ESOPHAGOGASTRODUODENOSCOPY N/A 03/29/2018   Procedure: ESOPHAGOGASTRODUODENOSCOPY (EGD);  Surgeon: Wilford Corner, MD;  Location: [place];  Service: Endoscopy;  Laterality: N/A;   GASTRECTOMY N/A 10/02/2018   Procedure: TOTAL GASTRECTOMY;  Surgeon: [name], MD;  Location: [place];  Service: General;  Laterality: N/A;   GASTROSTOMY N/A 10/02/2018   Procedure: FEEDING TUBE PLACEMENT;  Surgeon: [name], MD;  Location: [place];  Service: General;  Laterality: N/A;   LAPAROSCOPY N/A 04/13/2018   Procedure: LAPAROSCOPY DIAGNOSTIC;  Surgeon: [name], MD;  Location[facility];  Service: General;  Laterality: N/A;   LAPAROSCOPY N/A 10/02/2018   Procedure: LAPAROSCOPY DIAGNOSTIC;  Surgeon: [name], MD;  Location: [place];  Service: General;  Laterality: N/A;   MASTECTOMY Right 2007   PORT-A-CATH REMOVAL Left 11/07/2020   Procedure: PORT REMOVAL;  Surgeon: [name], MD;  Location: [place];  Service: General;  Laterality: Left;   PORTACATH PLACEMENT Left 04/13/2018   Procedure: [place];  Surgeon: [name], MD;  Location[facility];  Service: General;  Laterality: Left;  subclavian   UPPER GASTROINTESTINAL ENDOSCOPY      I have reviewed the social history and family history with the patient and they are unchanged from previous note.  ALLERGIES:  has No Known Allergies.  MEDICATIONS:  Current Outpatient Medications  Medication Sig Dispense Refill   acyclovir (ZOVIRAX) 400 MG tablet Take 1 tablet (400 mg total) by mouth 3 (three) times daily as needed. For five days. As needed for cold sore. 45 tablet 0   albuterol (VENTOLIN HFA) 108 (90 Base)  MCG/ACT inhaler Inhale 1-2 puffs into the lungs every 4 (four) hours as needed for wheezing or shortness of breath. 18 g 11   atorvastatin (LIPITOR) 10 MG tablet Take 1 tablet (10 mg total) by mouth daily. 90 tablet 3   calcium carbonate (TUMS - DOSED IN MG ELEMENTAL CALCIUM) 500 MG chewable tablet Chew 1 tablet (200 mg of elemental calcium total) by mouth 3 (three) times daily. 90 tablet 11   cyanocobalamin (VITAMIN B12) 1000 MCG/ML injection Inject 1 mL (1,000 mcg total) into the muscle once a week. 12 mL 4   fluticasone-salmeterol (ADVAIR HFA) 115-21 MCG/ACT inhaler Inhale 2 puffs into the lungs 2 (two) times daily. 3 each 99   levothyroxine (SYNTHROID) 200 MCG tablet Take 1 tablet (200 mcg total) by mouth daily. 90 tablet 3   liothyronine (CYTOMEL) 5 MCG tablet Take 1 tablet (5 mcg total) by mouth daily. 90 tablet 2   loperamide (IMODIUM) 2 MG capsule Take 1 capsule (2 mg total) by mouth 2 (two) times daily as needed for diarrhea or loose stools. 30 capsule 0   Needles & Syringes MISC 25 ga x 1 " IM needle/syringe 6 each prn   Potassium 99 MG TABS Take 99 mg by mouth  daily.     scopolamine (TRANSDERM-SCOP) 1 MG/3DAYS Place 1 patch (1.5 mg total) onto the skin every 3 (three) days. 4 patch 2   SYRINGE-NEEDLE, DISP, 3 ML (B-D 3CC LUER-LOK SYR 25GX1") 25G X 1" 3 ML MISC USE AS DIRECTED ONCE A WEEK 4 each 2   No current facility-administered medications for this visit.    PHYSICAL EXAMINATION: ECOG PERFORMANCE STATUS: 0 - Asymptomatic  Vitals:   01/23/22 1149  BP: (!) 145/91  Pulse: 67  Resp: 15  Temp: 98 F (36.7 C)  SpO2: 100%   Wt Readings from Last 3 Encounters:  01/23/22 125 lb 14.4 oz (57.1 kg)  12/05/21 125 lb 6.4 oz (56.9 kg)  10/12/21 129 lb 9.6 oz (58.8 kg)     GENERAL:alert, no distress and comfortable SKIN: skin color, texture, turgor are normal, no rashes or significant lesions EYES: normal, Conjunctiva are pink and non-injected, sclera clear  NECK: supple, thyroid  normal size, non-tender, without nodularity LYMPH:  no palpable lymphadenopathy in the cervical, axillary  LUNGS: clear to auscultation and percussion with normal breathing effort HEART: regular rate & rhythm and no murmurs, (+) mild lower extremity edema ABDOMEN: soft, non-tender and normal bowel sounds Musculoskeletal:no cyanosis of digits and no clubbing  NEURO: alert & oriented x 3 with fluent speech, no focal motor/sensory deficits  LABORATORY DATA:  I have reviewed the data as listed    Latest Ref Rng & Units 01/23/2022   10:51 AM 07/16/2021    9:06 AM 01/19/2021    8:39 AM  CBC  WBC 4.0 - 10.5 K/uL 5.6  5.9  6.2   Hemoglobin 12.0 - 15.0 g/dL 12.2  13.0  13.0   Hematocrit 36.0 - 46.0 % 35.7  38.1  36.9   Platelets 150 - 400 K/uL 173  179  219         Latest Ref Rng & Units 01/23/2022   10:51 AM 12/11/2021   12:00 AM 07/16/2021    9:06 AM  CMP  Glucose 70 - 99 mg/dL 59  84  86   BUN 6 - 20 mg/dL '12  12  11   '$ Creatinine 0.44 - 1.00 mg/dL 0.62  0.51  0.55   Sodium 135 - 145 mmol/L 142  142  141   Potassium 3.5 - 5.1 mmol/L 3.7  3.8  3.7   Chloride 98 - 111 mmol/L 107  105  102   CO2 22 - 32 mmol/L 29  30  32   Calcium 8.9 - 10.3 mg/dL 9.6  9.6  9.9   Total Protein 6.5 - 8.1 g/dL 7.6  7.2  8.2   Total Bilirubin 0.3 - 1.2 mg/dL 0.3  0.4  0.3   Alkaline Phos 38 - 126 U/L 79   89   AST 15 - 41 U/L 30  60  38   ALT 0 - 44 U/L 37  84  45       RADIOGRAPHIC STUDIES: I have personally reviewed the radiological images as listed and agreed with the findings in the report. No results found.    No orders of the defined types were placed in this encounter.  All questions were answered. The patient knows to call the clinic with any problems, questions or concerns. No barriers to learning was detected. The total time spent in the appointment was 25 minutes.     [name], MD 01/23/2022   I, Wilburn Mylar, am acting as scribe for [name], MD.  I have reviewed the above  documentation for accuracy and completeness, and I agree with the above.

## 2022-01-25 LAB — CANCER ANTIGEN 19-9: CA 19-9: 9 U/mL (ref 0–35)

## 2022-01-29 ENCOUNTER — Other Ambulatory Visit (INDEPENDENT_AMBULATORY_CARE_PROVIDER_SITE_OTHER): Payer: Medicare Other

## 2022-01-29 DIAGNOSIS — E039 Hypothyroidism, unspecified: Secondary | ICD-10-CM

## 2022-01-29 LAB — T4, FREE: Free T4: 0.77 ng/dL (ref 0.60–1.60)

## 2022-01-29 LAB — TSH: TSH: 4.25 u[IU]/mL (ref 0.35–5.50)

## 2022-01-30 ENCOUNTER — Other Ambulatory Visit: Payer: Medicare Other

## 2022-04-22 ENCOUNTER — Ambulatory Visit (INDEPENDENT_AMBULATORY_CARE_PROVIDER_SITE_OTHER): Payer: Medicare Other | Admitting: Internal Medicine

## 2022-04-22 ENCOUNTER — Encounter: Payer: Self-pay | Admitting: Internal Medicine

## 2022-04-22 VITALS — BP 110/76 | HR 76 | Ht 60.0 in | Wt 126.0 lb

## 2022-04-22 DIAGNOSIS — E039 Hypothyroidism, unspecified: Secondary | ICD-10-CM

## 2022-04-22 LAB — TSH: TSH: 1.44 u[IU]/mL (ref 0.35–5.50)

## 2022-04-22 MED ORDER — LIOTHYRONINE SODIUM 5 MCG PO TABS: 5.0000 ug | ORAL_TABLET | Freq: Every day | ORAL | 3 refills | Status: DC

## 2022-04-22 MED ORDER — LEVOTHYROXINE SODIUM 200 MCG PO TABS: 200.0000 ug | ORAL_TABLET | Freq: Every day | ORAL | 3 refills | Status: DC

## 2022-04-22 NOTE — Progress Notes (Signed)
Name: Mariah Kemp  MRN/ DOB: [id number], [birth year]    Age/ Sex: 61 y.o., female     PCP: Samuel Bouche, NP   Reason for Endocrinology Evaluation: Hypothyroidism     Initial Endocrinology Clinic Visit: 07/19/2020    PATIENT IDENTIFIER: [name] is a 62 y.o., female with a past medical history of Hx of breast Ca ( S/P right mastectomy ) , Hx of gastric cancer HTn and hypothyroidism. She has followed with [facility] since 07/19/2020 for consultative assistance with management of her Hypothyroidism.   HISTORICAL SUMMARY:  She has been diagnosed with gastric cancer in 2020. S/P chemo from 1-07/2018 followed by total gastrectomy 09/2018.  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      She has been diagnosed with Hypothyroidism ~ 30 yrs ago . She has been on LT-4 for years.    Started liothyronine 11/2021 due to variable absorption of levothyroxine  No FH of thyroid disease  SUBJECTIVE:    Today (04/22/2022):  Mariah Kemp is here for hypothyroidism.   She was seen by Dr. Burr Medico (oncology) for follow-up on gastric carcinoma 01/23/2022 Weight has been stable  Hair loss fluctuating  Denies constipation or diarrhea  Denies local neck symptoms  Denies palpitations  Denies tremors    No Biotin  She is on MVI   Levothyroxine 200 mcg daily  Liothyronine 5 mcg daily    HISTORY:  Past Medical History:  Past Medical History:  Diagnosis Date   Anemia    Asthma    Blood transfusion without reported diagnosis    Breast cancer ([place])    Dyspnea    over exertion   Hypertension    Hypothyroidism    Stomach cancer [facility])    Past Surgical History:  Past Surgical History:  Procedure Laterality Date   ABDOMINAL HYSTERECTOMY  1996   BARIATRIC SURGERY     stomach removal   BILATERAL SALPINGOOPHORECTOMY  2007   BIOPSY  03/29/2018   Procedure: BIOPSY;  Surgeon: Wilford Corner, MD;  [facility]COPY;  Service: Endoscopy;;   CARPAL TUNNEL RELEASE      COLONOSCOPY N/A 03/29/2018   Procedure: COLONOSCOPY;  Surgeon: Wilford Corner, MD;  Location: [place];  Service: Endoscopy;  Laterality: N/A;   COLONOSCOPY     ESOPHAGOGASTRODUODENOSCOPY N/A 03/29/2018   Procedure: ESOPHAGOGASTRODUODENOSCOPY (EGD);  Surgeon: Wilford Corner, MD;  Location: [place];  Service: Endoscopy;  Laterality: N/A;   GASTRECTOMY N/A 10/02/2018   Procedure: TOTAL GASTRECTOMY;  Surgeon: [name], MD;  Location: [place];  Service: General;  Laterality: N/A;   GASTROSTOMY N/A 10/02/2018   Procedure: FEEDING TUBE PLACEMENT;  Surgeon: [name], MD;  Location: [place];  Service: General;  Laterality: N/A;   LAPAROSCOPY N/A 04/13/2018   Procedure: LAPAROSCOPY DIAGNOSTIC;  Surgeon: [name], MD;  [facility];  Service: General;  Laterality: N/A;   LAPAROSCOPY N/A 10/02/2018   Procedure: LAPAROSCOPY DIAGNOSTIC;  Surgeon: [name], MD;  Location: [place];  Service: General;  Laterality: N/A;   MASTECTOMY Right 2007   PORT-A-CATH REMOVAL Left 11/07/2020   Procedure: PORT REMOVAL;  Surgeon: [name], MD;  Location: [place];  Service: General;  Laterality: Left;   PORTACATH PLACEMENT Left 04/13/2018   Procedure: [place];  Surgeon: [name], MD;  [facility];  Service: General;  Laterality: Left;  subclavian   UPPER GASTROINTESTINAL ENDOSCOPY     Social History:  reports that she quit smoking about 28 years ago. Her  smoking use included cigarettes. She has a 7.50 pack-year smoking history. She has never used smokeless tobacco. She reports that she does not drink alcohol and does not use drugs. Family History:  Family History  Problem Relation Age of Onset   Liver cancer Father    Cancer Paternal Uncle        unknown type cancer   Cirrhosis Mother    Colon cancer Neg Hx    Esophageal cancer Neg Hx    Rectal cancer Neg Hx    Stomach cancer Neg Hx      HOME MEDICATIONS: Allergies as of 04/22/2022   No  Known Allergies      Medication List        Accurate as of April 22, 2022 12:53 PM. If you have any questions, ask your nurse or doctor.          STOP taking these medications    scopolamine 1 MG/3DAYS Commonly known as: TRANSDERM-SCOP Stopped by: Dorita Sciara, MD       TAKE these medications    acyclovir 400 MG tablet Commonly known as: Zovirax Take 1 tablet (400 mg total) by mouth 3 (three) times daily as needed. For five days. As needed for cold sore.   Advair HFA 115-21 MCG/ACT inhaler Generic drug: fluticasone-salmeterol Inhale 2 puffs into the lungs 2 (two) times daily.   albuterol 108 (90 Base) MCG/ACT inhaler Commonly known as: VENTOLIN HFA Inhale 1-2 puffs into the lungs every 4 (four) hours as needed for wheezing or shortness of breath.   atorvastatin 10 MG tablet Commonly known as: LIPITOR Take 1 tablet (10 mg total) by mouth daily.   B-D 3CC LUER-LOK SYR 25GX1" 25G X 1" 3 ML Misc Generic drug: SYRINGE-NEEDLE (DISP) 3 ML USE AS DIRECTED ONCE A WEEK   calcium carbonate 500 MG chewable tablet Commonly known as: TUMS - dosed in mg elemental calcium Chew 1 tablet (200 mg of elemental calcium total) by mouth 3 (three) times daily.   cyanocobalamin 1000 MCG/ML injection Commonly known as: VITAMIN B12 Inject 1 mL (1,000 mcg total) into the muscle once a week.   levothyroxine 200 MCG tablet Commonly known as: SYNTHROID Take 1 tablet (200 mcg total) by mouth daily.   liothyronine 5 MCG tablet Commonly known as: CYTOMEL Take 1 tablet (5 mcg total) by mouth daily.   loperamide 2 MG capsule Commonly known as: IMODIUM Take 1 capsule (2 mg total) by mouth 2 (two) times daily as needed for diarrhea or loose stools.   Needles & Syringes Misc 25 ga x 1 " IM needle/syringe   Potassium 99 MG Tabs Take 99 mg by mouth daily.          OBJECTIVE:   PHYSICAL EXAM: VS: BP 110/76 (BP Location: Left Arm, Patient Position: Sitting, Cuff Size:  Small)   Pulse 76   Ht 5' (1.524 m)   Wt 126 lb (57.2 kg)   SpO2 99%   BMI 24.61 kg/m    EXAM: General: Pt appears well and is in NAD  Neck: General: Supple without adenopathy. Thyroid: Thyroid size normal.  No goiter or nodules appreciated.  Lungs: Clear with good BS bilat with no rales, rhonchi, or wheezes  Heart: Auscultation: RRR.  Abdomen: Normoactive bowel sounds, soft, nontender, without masses or organomegaly palpable  Extremities:  BL LE: No pretibial edema normal ROM and strength.  Mental Status: Judgment, insight: Intact Memory: Intact for recent and remote events Mood and affect: No depression, anxiety, or  agitation     DATA REVIEWED:   Latest Reference Range & Units 04/22/22 10:25  TSH 0.35 - 5.50 uIU/mL 1.44   ASSESSMENT / PLAN / RECOMMENDATIONS:   Hypothyroidism:   - No local neck symptoms  -Patient with variable LT-4 absorption , hence fluctuating TFTs -She is tolerating liothyronine without any side effects -TSH is normal -No change   Medications   Continue Levothyroxine 200 mcg daily  Continue liothyronine 5 mcg daily   Follow-up in 6 months     Signed electronically by: Mack Guise, MD  [facility]  [address] [place], [place] [address] Phone: [phone] FAX: [phone]      CC: Samuel Bouche, [address] [place] [address] Phone: [phone]  Fax: [phone]   Return to Endocrinology clinic as below: Future Appointments  Date Time Provider [place]  09/03/2022 10:00 AM PCK-NURSE HEALTH ADVISOR [facility] None  10/23/2022  8:30 AM Bayne Fosnaugh, Melanie Crazier, MD [facility] None

## 2022-07-31 ENCOUNTER — Other Ambulatory Visit: Payer: Self-pay | Admitting: Hematology

## 2022-09-09 ENCOUNTER — Encounter: Payer: Self-pay | Admitting: Medical-Surgical

## 2022-09-10 MED ORDER — FLUTICASONE-SALMETEROL 115-21 MCG/ACT IN AERO: 2.0000 | INHALATION_SPRAY | Freq: Two times a day (BID) | RESPIRATORY_TRACT | 0 refills | Status: DC

## 2022-09-10 MED ORDER — ALBUTEROL SULFATE HFA 108 (90 BASE) MCG/ACT IN AERS: 1.0000 | INHALATION_SPRAY | RESPIRATORY_TRACT | 0 refills | Status: DC | PRN

## 2022-09-10 NOTE — Telephone Encounter (Signed)
Noted! Thank you

## 2022-09-10 NOTE — Telephone Encounter (Signed)
Patient requesting rx rf of inhalers Sent in one month supply of  Advair and ventolin HFA as per protocl Left a detailed message on patient phone that  appointment is necessary and asked that she contact our office to schedule this appointment.

## 2022-09-16 ENCOUNTER — Ambulatory Visit (INDEPENDENT_AMBULATORY_CARE_PROVIDER_SITE_OTHER): Payer: Medicare Other | Admitting: Medical-Surgical

## 2022-09-16 VITALS — BP 106/68 | HR 76 | Ht 60.0 in | Wt 123.0 lb

## 2022-09-16 DIAGNOSIS — E781 Pure hyperglyceridemia: Secondary | ICD-10-CM

## 2022-09-16 DIAGNOSIS — Z636 Dependent relative needing care at home: Secondary | ICD-10-CM

## 2022-09-16 DIAGNOSIS — I10 Essential (primary) hypertension: Secondary | ICD-10-CM

## 2022-09-16 DIAGNOSIS — J4540 Moderate persistent asthma, uncomplicated: Secondary | ICD-10-CM

## 2022-09-16 DIAGNOSIS — E039 Hypothyroidism, unspecified: Secondary | ICD-10-CM

## 2022-09-16 DIAGNOSIS — J410 Simple chronic bronchitis: Secondary | ICD-10-CM

## 2022-09-16 DIAGNOSIS — D500 Iron deficiency anemia secondary to blood loss (chronic): Secondary | ICD-10-CM

## 2022-09-16 DIAGNOSIS — E538 Deficiency of other specified B group vitamins: Secondary | ICD-10-CM

## 2022-09-16 MED ORDER — ALBUTEROL SULFATE HFA 108 (90 BASE) MCG/ACT IN AERS: 1.0000 | INHALATION_SPRAY | RESPIRATORY_TRACT | 0 refills | Status: DC | PRN

## 2022-09-16 MED ORDER — ATORVASTATIN CALCIUM 10 MG PO TABS: 10.0000 mg | ORAL_TABLET | Freq: Every day | ORAL | 3 refills | Status: DC

## 2022-09-16 MED ORDER — FLUTICASONE-SALMETEROL 115-21 MCG/ACT IN AERO: 2.0000 | INHALATION_SPRAY | Freq: Two times a day (BID) | RESPIRATORY_TRACT | 0 refills | Status: DC

## 2022-09-16 NOTE — Progress Notes (Signed)
        Established patient visit  History, exam, impression, and plan:  1. Essential hypertension Pleasant 62 year old female presenting today to follow-up on hypertension.  Not currently on any medications for blood pressure control.  Follows a low-sodium diet and is cognizant to maintain a healthy weight.  Activity as tolerated.  Denies concerning symptoms.  Cardiopulmonary exam normal.  Continue low-sodium diet and regular intentional activity with a goal for maintaining a healthy weight.  Checking labs as below. - CBC with Differential/Platelet - COMPLETE METABOLIC PANEL WITH GFR - Lipid panel  2. Moderate persistent asthma without complication 3. Simple chronic bronchitis (HCC) Has been doing well overall with the use of Advair twice daily.  Has an albuterol inhaler to use as needed however reports that she needs to use this only on rare occasions.  No current shortness of breath, coughing, wheezing, etc.  Lungs CTA with even unlabored respirations.  Continue Advair twice daily as prescribed.  Continue as needed albuterol.  4. Hypertriglyceridemia Taking Lipitor 10 mg daily, tolerating well without side effects.  Following low-fat heart healthy diet.  Activity as tolerated and as time permits.  Rechecking lipid panel today.  Continue Lipitor as prescribed.  5. Acquired hypothyroidism Followed by endocrinology.  6. B12 deficiency History of vitamin B12 deficiency that she is currently treating with weekly injections of vitamin B12.  Rechecking levels today. - Vitamin B12  7. Iron deficiency anemia due to chronic blood loss History of iron deficiency anemia.  We have recent ferritins on file however no full iron panel.  Rechecking today. - Iron, TIBC and Ferritin Panel  8. 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  Notes that they argue quite a bit and she gets very  frustrated at times.  Not currently on any medications for mood management and has never tried anything before.  Denies SI/HI.  Not currently doing counseling but is open to the idea.  Referring to behavioral health today. - Ambulatory referral to Behavioral Health  Procedures performed this visit: None.  Return in about 1 year (around 09/16/2023) for chronic disease follow up or sooner if needed.  __________________________________ Thayer Ohm, DNP, APRN, FNP-BC Primary Care and Sports Medicine [facility]

## 2022-09-17 LAB — COMPLETE METABOLIC PANEL WITH GFR
AG Ratio: 1.5 (calc) (ref 1.0–2.5)
ALT: 25 U/L (ref 6–29)
AST: 24 U/L (ref 10–35)
Albumin: 4.4 g/dL (ref 3.6–5.1)
Alkaline phosphatase (APISO): 76 U/L (ref 37–153)
BUN/Creatinine Ratio: 33 (calc) — ABNORMAL HIGH (ref 6–22)
BUN: 15 mg/dL (ref 7–25)
CO2: 27 mmol/L (ref 20–32)
Calcium: 9.8 mg/dL (ref 8.6–10.4)
Chloride: 103 mmol/L (ref 98–110)
Creat: 0.46 mg/dL — ABNORMAL LOW (ref 0.50–1.05)
Globulin: 2.9 g/dL (calc) (ref 1.9–3.7)
Glucose, Bld: 86 mg/dL (ref 65–99)
Potassium: 4.3 mmol/L (ref 3.5–5.3)
Sodium: 140 mmol/L (ref 135–146)
Total Bilirubin: 0.3 mg/dL (ref 0.2–1.2)
Total Protein: 7.3 g/dL (ref 6.1–8.1)
eGFR: 109 mL/min/{1.73_m2} (ref >=60)

## 2022-09-17 LAB — CBC WITH DIFFERENTIAL/PLATELET
Absolute Monocytes: 574 cells/uL (ref 200–950)
Basophils Absolute: 49 cells/uL (ref 0–200)
Basophils Relative: 0.7 %
Eosinophils Absolute: 637 cells/uL — ABNORMAL HIGH (ref 15–500)
Eosinophils Relative: 9.1 %
HCT: 35.5 % (ref 35.0–45.0)
Hemoglobin: 12 g/dL (ref 11.7–15.5)
Lymphs Abs: 2303 cells/uL (ref 850–3900)
MCH: 31.9 pg (ref 27.0–33.0)
MCHC: 33.8 g/dL (ref 32.0–36.0)
MCV: 94.4 fL (ref 80.0–100.0)
MPV: 11.2 fL (ref 7.5–12.5)
Monocytes Relative: 8.2 %
Neutro Abs: 3437 cells/uL (ref 1500–7800)
Neutrophils Relative %: 49.1 %
Platelets: 210 10*3/uL (ref 140–400)
RBC: 3.76 10*6/uL — ABNORMAL LOW (ref 3.80–5.10)
RDW: 11.8 % (ref 11.0–15.0)
Total Lymphocyte: 32.9 %
WBC: 7 10*3/uL (ref 3.8–10.8)

## 2022-09-17 LAB — LIPID PANEL
Cholesterol: 151 mg/dL (ref <200)
HDL: 40 mg/dL — ABNORMAL LOW (ref >=50)
LDL Cholesterol (Calc): 86 mg/dL (calc)
Non-HDL Cholesterol (Calc): 111 mg/dL (calc) (ref <130)
Total CHOL/HDL Ratio: 3.8 (calc) (ref <5.0)
Triglycerides: 151 mg/dL — ABNORMAL HIGH (ref <150)

## 2022-09-17 LAB — IRON,TIBC AND FERRITIN PANEL
%SAT: 27 % (calc) (ref 16–45)
Ferritin: 767 ng/mL — ABNORMAL HIGH (ref 16–232)
Iron: 87 ug/dL (ref 45–160)
TIBC: 319 mcg/dL (calc) (ref 250–450)

## 2022-09-17 LAB — VITAMIN B12: Vitamin B-12: 458 pg/mL (ref 200–1100)

## 2022-09-23 ENCOUNTER — Encounter: Payer: Self-pay | Admitting: Hematology

## 2022-10-11 ENCOUNTER — Encounter: Payer: Self-pay | Admitting: Medical-Surgical

## 2022-10-11 DIAGNOSIS — Z636 Dependent relative needing care at home: Secondary | ICD-10-CM

## 2022-10-14 MED ORDER — SERTRALINE HCL 50 MG PO TABS
ORAL_TABLET | ORAL | 3 refills | Status: DC
Start: 2022-10-14 — End: 2023-02-24

## 2022-10-15 ENCOUNTER — Encounter: Payer: Self-pay | Admitting: Hematology

## 2022-10-21 ENCOUNTER — Ambulatory Visit: Payer: Medicare Other | Admitting: Internal Medicine

## 2022-10-21 ENCOUNTER — Encounter: Payer: Self-pay | Admitting: Internal Medicine

## 2022-10-21 VITALS — BP 100/68 | HR 72 | Ht 60.0 in | Wt 123.0 lb

## 2022-10-21 DIAGNOSIS — E039 Hypothyroidism, unspecified: Secondary | ICD-10-CM | POA: Diagnosis not present

## 2022-10-21 LAB — TSH: TSH: 0 u[IU]/mL — ABNORMAL LOW (ref 0.35–5.50)

## 2022-10-21 NOTE — Progress Notes (Unsigned)
Name: Mariah Kemp  MRN/ DOB: [id number], 10-27-1960    Age/ Sex: 62 y.o., female     PCP: Christen Butter, NP   Reason for Endocrinology Evaluation: Hypothyroidism     Initial Endocrinology Clinic Visit: 07/19/2020    PATIENT IDENTIFIER: Mariah Kemp is a 62 y.o., female with a past medical history of Hx of breast Ca ( S/P right mastectomy ) , Hx of gastric cancer HTn and hypothyroidism. She has followed with [facility] since 07/19/2020 for consultative assistance with management of her Hypothyroidism.   HISTORICAL SUMMARY:  She has been diagnosed with gastric cancer in 2020. S/P chemo from 1-07/2018 followed by total gastrectomy 09/2018.  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      She has been diagnosed with Hypothyroidism ~ 30 yrs ago . She has been on LT-4 for years.    Started liothyronine 11/2021 due to variable absorption of levothyroxine  No FH of thyroid disease  SUBJECTIVE:    Today (10/21/2022):  Mariah Kemp is here for hypothyroidism.   She was seen by Dr. Mosetta Putt (oncology) for follow-up on gastric carcinoma Patient has been noted with weight loss, appetite is good  Has chronic nausea  Denies constipation or diarrhea  Denies local neck symptoms  Denies palpitations  Denies tremors    She is on  Biotin , last dose was 4 days  She is on MVI   Levothyroxine 200 mcg daily  Liothyronine 5 mcg daily    HISTORY:  Past Medical History:  Past Medical History:  Diagnosis Date   Anemia    Asthma    Blood transfusion without reported diagnosis    Breast cancer (HCC)    Dyspnea    over exertion   Hypertension    Hypothyroidism    Stomach cancer [facility])    Past Surgical History:  Past Surgical History:  Procedure Laterality Date   ABDOMINAL HYSTERECTOMY  1996   BARIATRIC SURGERY     stomach removal   BILATERAL SALPINGOOPHORECTOMY  2007   BIOPSY  03/29/2018   Procedure: BIOPSY;  Surgeon: Charlott Rakes, MD;  Location: [facility]  ENDOSCOPY;  Service: Endoscopy;;   CARPAL TUNNEL RELEASE     COLONOSCOPY N/A 03/29/2018   Procedure: COLONOSCOPY;  Surgeon: Charlott Rakes, MD;  Location: [facility] ENDOSCOPY;  Service: Endoscopy;  Laterality: N/A;   COLONOSCOPY     ESOPHAGOGASTRODUODENOSCOPY N/A 03/29/2018   Procedure: ESOPHAGOGASTRODUODENOSCOPY (EGD);  Surgeon: Charlott Rakes, MD;  Location: [facility] ENDOSCOPY;  Service: Endoscopy;  Laterality: N/A;   GASTRECTOMY N/A 10/02/2018   Procedure: TOTAL GASTRECTOMY;  Surgeon: Almond Lint, MD;  Location: [facility] OR;  Service: General;  Laterality: N/A;   GASTROSTOMY N/A 10/02/2018   Procedure: FEEDING TUBE PLACEMENT;  Surgeon: Almond Lint, MD;  Location: [facility] OR;  Service: General;  Laterality: N/A;   LAPAROSCOPY N/A 04/13/2018   Procedure: LAPAROSCOPY DIAGNOSTIC;  Surgeon: Almond Lint, MD;  Location: [facility] ORS;  Service: General;  Laterality: N/A;   LAPAROSCOPY N/A 10/02/2018   Procedure: LAPAROSCOPY DIAGNOSTIC;  Surgeon: Almond Lint, MD;  Location: [facility] OR;  Service: General;  Laterality: N/A;   MASTECTOMY Right 2007   PORT-A-CATH REMOVAL Left 11/07/2020   Procedure: PORT REMOVAL;  Surgeon: Almond Lint, MD;  Location: [facility] OR;  Service: General;  Laterality: Left;   PORTACATH PLACEMENT Left 04/13/2018   Procedure: INSERTION PORT-A-CATH ERAS PATHWAY;  Surgeon: Almond Lint, MD;  Location: [facility] ORS;  Service: General;  Laterality: Left;  subclavian   UPPER GASTROINTESTINAL ENDOSCOPY  Social History:  reports that she quit smoking about 28 years ago. Her smoking use included cigarettes. She started smoking about 43 years ago. She has a 7.5 pack-year smoking history. She has never used smokeless tobacco. She reports that she does not drink alcohol and does not use drugs. Family History:  Family History  Problem Relation Age of Onset   Liver cancer Father    Cancer Paternal Uncle        unknown type cancer   Cirrhosis Mother    Colon cancer Neg Hx    Esophageal cancer Neg Hx    Rectal  cancer Neg Hx    Stomach cancer Neg Hx      HOME MEDICATIONS: Allergies as of 10/21/2022   No Known Allergies      Medication List        Accurate as of October 21, 2022  7:16 AM. If you have any questions, ask your nurse or doctor.          acyclovir 400 MG tablet Commonly known as: Zovirax Take 1 tablet (400 mg total) by mouth 3 (three) times daily as needed. For five days. As needed for cold sore.   albuterol 108 (90 Base) MCG/ACT inhaler Commonly known as: VENTOLIN HFA Inhale 1-2 puffs into the lungs every 4 (four) hours as needed for wheezing or shortness of breath.   atorvastatin 10 MG tablet Commonly known as: LIPITOR Take 1 tablet (10 mg total) by mouth daily.   B-D 3CC LUER-LOK SYR 25GX1" 25G X 1" 3 ML Misc Generic drug: SYRINGE-NEEDLE (DISP) 3 ML USE AS DIRECTED ONCE A WEEK   calcium carbonate 500 MG chewable tablet Commonly known as: TUMS - dosed in mg elemental calcium Chew 1 tablet (200 mg of elemental calcium total) by mouth 3 (three) times daily.   cyanocobalamin 1000 MCG/ML injection Commonly known as: VITAMIN B12 Inject 1 mL (1,000 mcg total) into the muscle once a week.   fluticasone-salmeterol 115-21 MCG/ACT inhaler Commonly known as: Advair HFA Inhale 2 puffs into the lungs 2 (two) times daily.   levothyroxine 200 MCG tablet Commonly known as: SYNTHROID Take 1 tablet (200 mcg total) by mouth daily.   liothyronine 5 MCG tablet Commonly known as: CYTOMEL Take 1 tablet (5 mcg total) by mouth daily.   loperamide 2 MG capsule Commonly known as: IMODIUM Take 1 capsule (2 mg total) by mouth 2 (two) times daily as needed for diarrhea or loose stools.   Needles & Syringes Misc 25 ga x 1 " IM needle/syringe   sertraline 50 MG tablet Commonly known as: ZOLOFT Take 1/2 tablet (25 mg) daily x 7 days then increase to 1 full tablet (50 mg) daily.          OBJECTIVE:   PHYSICAL EXAM: VS: There were no vitals taken for this  visit.   EXAM: General: Pt appears well and is in NAD  Neck: General: Supple without adenopathy. Thyroid: Thyroid size normal.  No goiter or nodules appreciated.  Lungs: Clear with good BS bilat with no rales, rhonchi, or wheezes  Heart: Auscultation: RRR.  Abdomen: Normoactive bowel sounds, soft, nontender, without masses or organomegaly palpable  Extremities:  BL LE: No pretibial edema normal ROM and strength.  Mental Status: Judgment, insight: Intact Memory: Intact for recent and remote events Mood and affect: No depression, anxiety, or agitation     DATA REVIEWED:   Latest Reference Range & Units 04/22/22 10:25  TSH 0.35 - 5.50 uIU/mL 1.44  ASSESSMENT / PLAN / RECOMMENDATIONS:   Hypothyroidism:   - No local neck symptoms  -Patient with variable LT-4 absorption , hence fluctuating TFTs -She is tolerating liothyronine without any side effects -TSH is normal -No change   Medications   Continue Levothyroxine 200 mcg daily  Continue liothyronine 5 mcg daily   Follow-up in 6 months     Signed electronically by: Lyndle Herrlich, MD  [facility]  [facility] [address] [place] [place], [place] [address] Phone: [phone] FAX: [phone]      CC: Christen Butter, NP [address] [place] [address] Phone: [phone]  Fax: [phone]   Return to Endocrinology clinic as below: Future Appointments  Date Time Provider Department Center  10/21/2022 10:10 AM , Konrad Dolores, MD [facility] None  11/14/2022 10:00 AM Teofilo Pod, [facility] None  09/18/2023  1:20 PM Christen Butter, NP [facility] None

## 2022-10-21 NOTE — Patient Instructions (Signed)
You are on levothyroxine - which is your thyroid hormone supplement. You MUST take this consistently.  You should take this first thing in the morning on an empty stomach with water. You should not take it with other medications. Wait 38min to 1hr prior to eating. If you are taking any vitamins - please take these in the evening.   If you miss a dose, please take your missed dose the following day (double the dose for that day). You should have a pill box for ONLY levothyroxine on your bedside table to help you remember to take your medications.    HOLD Biotin 2-3 days before future thyroid blood work

## 2022-10-22 MED ORDER — LEVOTHYROXINE SODIUM 175 MCG PO TABS: 175.0000 ug | ORAL_TABLET | Freq: Every day | ORAL | 3 refills | Status: DC

## 2022-10-22 MED ORDER — LIOTHYRONINE SODIUM 5 MCG PO TABS: 5.0000 ug | ORAL_TABLET | Freq: Every day | ORAL | 3 refills | Status: DC

## 2022-10-23 ENCOUNTER — Ambulatory Visit: Payer: Medicare Other | Admitting: Internal Medicine

## 2022-10-24 ENCOUNTER — Telehealth: Payer: Self-pay | Admitting: Gastroenterology

## 2022-10-24 NOTE — Telephone Encounter (Signed)
Pt scheduled to see Quentin Mulling PA 11/01/22 at 1:30pm. Please notify pt of appt.

## 2022-10-24 NOTE — Telephone Encounter (Signed)
Inbound call from patient stating she is having a very hard time swallowing. Advised patient of soonest appointment in October. States she had stomach surgery due to cancer and does not think she can wait that long to find out why she is having trouble swallowing. Requesting a call back to discuss further. Please advise, thank you.

## 2022-10-25 NOTE — Telephone Encounter (Signed)
Called patient to notify of appointment. Left voicemail to give a call back to confirm.

## 2022-10-25 NOTE — Telephone Encounter (Signed)
Appt moved to 11/06/22 at 1:30pm with Quentin Mulling PA. Please let her know about the appt.

## 2022-10-25 NOTE — Telephone Encounter (Signed)
Contacted patient to advise of appointment. Patient stated she will not be able to make 8/16 appointment due to being out of town and returning on 8/20. Please advise, thank you.

## 2022-10-30 ENCOUNTER — Encounter: Payer: Self-pay | Admitting: Medical-Surgical

## 2022-11-01 ENCOUNTER — Ambulatory Visit: Admitting: Physician Assistant

## 2022-11-05 NOTE — Progress Notes (Unsigned)
11/06/2022 Mariah Kemp [id number] Aug 30, 1960  Referring provider: Christen Butter, NP Primary GI doctor: Dr. Myrtie Neither  ASSESSMENT AND PLAN:   Gastroesophageal reflux disease worsening with weight loss, dysphagia, history of gastric cancer s/p gastrectomy 2020 Lifestyle changes discussed, avoid NSAIDS, ETOH Start on omeprazole 20 mg, emphasized PPI 30 minutes- 1hour before food Discussed with the patient, due to GERD, weight loss, and worsening symptoms and she would would prefer to evaluate with EGD Will schedule for EGD at [facility] with Dr. Myrtie Neither I discussed risks of EGD with patient today, including risk of sedation, bleeding or perforation.  Patient provides understanding and gave verbal consent to proceed. Gastroparesis diet given ? Bile reflux/short limb if EGD is unremarkable- could consider carafate.   Acquired hypothyroidism A month ago last thyroid was 0.00, has been adjusted, has not had repeat, could be contributing to her symptoms   Patient Care Team: Christen Butter, NP as PCP - General (Nurse Practitioner) Almond Lint, MD as Consulting Physician (General Surgery) Malachy Mood, MD as Consulting Physician (Hematology) Pollyann Samples, NP as Nurse Practitioner (Nurse Practitioner)  HISTORY OF PRESENT ILLNESS: 62 y.o. 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   03/28/2018 colonoscopy with Dr. Bosie Clos for IDA and melena completely unremarkable, no specimens collected recall 10 years, recall 03/2028 03/29/2018 EGD nonobstructing oozing gastric ulcer with pigmented material biopsies showed adenocarcinoma 2021 UGI postoperative appearance no esophageal stricture or diverticulum no gross mucosal ulceration esophageal motility normal, 13 mm barium tablet passed readily. 01/26/2020 EGD  with Dr. Myrtie Neither for esophageal dysphagia status post gastrectomy for cancer and esophageal jejunostomy showed total gastrectomy with GE J anastomosis short blind end and the efferent loop s/p appears healthy without stricture or inflammation examined jejunum normal Symptoms occuring because of the  normal shape of the anastomosis, with the effent limb not directly in line with the esophageal lumen.  07/16/2021 CT chest abdomen pelvis with contrast for surveillance of history gastric cancer with Dr.Feng  No evidence of new or progressive disease in the chest abdomen pelvis.  No significant interval change in multifocal bilateral pulmonary nodules stable over multiple priors most consistent with benign infectious or inflammatory etiology aortic atherosclerosis.  Normal liver normal gallbladder no ductal dilation normal pancreas normal spleen postsurgical changes of prior gastrectomy appears similar to prior normal TI no dilation of small or large bowel. 09/16/2022 Hgb 12, MCV 94, WBC 7, platelets 210, iron 87% saturation 27, ferritin 767, thyroid test 0.00. No anemia.   She has had GERD in the past but it has gotten worse in the last month where tums and pepto is not helping.  She has reflux every night, has to take pepto, and got adjustable bed to elevate her bed and has continued to have issues.  Having reflux of acid/bile into her mouth with vomiting, can be day or night. Never regurg of food.  She declines dysphagia of foods/pills, very rare if she eats too much will feel very full but this is very rare.  She states since the surgery she has not been able to swallow water, feels she has swollen an ice cube, carbonation get stuck/burp, but anything like coffee, koolaid, does well.  She has a lot of burping/gas.  She also has an adjustable bed,and elevated  been having regurgitation of acid and it burns.  No  AB pain, no melena.  She has had weight loss 6 lbs from Feb, she did have hyperthyroid 10/21/2022  and recent adjustment . She did not change her eating, no new meds, no OTC meds.   She denies blood thinner use.  She denies NSAID use.  She denies ETOH use.   She denies tobacco use.  She denies drug use.    She  reports that she quit smoking about 28 years ago. Her smoking use included cigarettes. She started smoking about 43 years ago. She has a 7.5 pack-year smoking history. She has never used smokeless tobacco. She reports that she does not drink alcohol and does not use drugs.  RELEVANT LABS AND IMAGING: CBC    Component Value Date/Time   WBC 7.0 09/16/2022 1502   RBC 3.76 (L) 09/16/2022 1502   HGB 12.0 09/16/2022 1502   HGB 12.2 01/23/2022 1051   HCT 35.5 09/16/2022 1502   PLT 210 09/16/2022 1502   PLT 173 01/23/2022 1051   MCV 94.4 09/16/2022 1502   MCH 31.9 09/16/2022 1502   MCHC 33.8 09/16/2022 1502   RDW 11.8 09/16/2022 1502   LYMPHSABS 2,303 09/16/2022 1502   MONOABS 0.5 01/23/2022 1051   EOSABS 637 (H) 09/16/2022 1502   BASOSABS 49 09/16/2022 1502   Recent Labs    01/23/22 1051 09/16/22 1502  HGB 12.2 12.0    CMP     Component Value Date/Time   NA 140 09/16/2022 1502   K 4.3 09/16/2022 1502   CL 103 09/16/2022 1502   CO2 27 09/16/2022 1502   GLUCOSE 86 09/16/2022 1502   BUN 15 09/16/2022 1502   CREATININE 0.46 (L) 09/16/2022 1502   CALCIUM 9.8 09/16/2022 1502   PROT 7.3 09/16/2022 1502   ALBUMIN 4.2 01/23/2022 1051   AST 24 09/16/2022 1502   AST 30 01/23/2022 1051   ALT 25 09/16/2022 1502   ALT 37 01/23/2022 1051   ALKPHOS 79 01/23/2022 1051   BILITOT 0.3 09/16/2022 1502   BILITOT 0.3 01/23/2022 1051   GFRNONAA >60 01/23/2022 1051   GFRNONAA 98 10/08/2017 0750   GFRAA >60 08/31/2019 1037   GFRAA 113 10/08/2017 0750      Latest Ref Rng & Units 09/16/2022    3:02 PM 01/23/2022   10:51 AM 12/11/2021   12:00 AM  Hepatic Function  Total Protein 6.1 - 8.1 g/dL 7.3  7.6  7.2   Albumin 3.5 - 5.0 g/dL  4.2    AST 10 - 35 U/L 24  30  60   ALT 6 -  29 U/L 25  37  84   Alk Phosphatase 38 - 126 U/L  79    Total Bilirubin 0.2 - 1.2 mg/dL 0.3  0.3  0.4       Current Medications:   Current Outpatient Medications (Endocrine & Metabolic):    levothyroxine (SYNTHROID) 175 MCG tablet, Take 1 tablet (175 mcg total) by mouth daily.   liothyronine (CYTOMEL) 5 MCG tablet, Take 1 tablet (5 mcg total) by mouth daily.  Current Outpatient Medications (Cardiovascular):    atorvastatin (LIPITOR) 10 MG tablet, Take 1 tablet (10 mg total) by mouth daily.  Current Outpatient Medications (Respiratory):    albuterol (VENTOLIN HFA) 108 (90 Base) MCG/ACT inhaler, Inhale 1-2 puffs into the lungs every 4 (four) hours as needed for wheezing or shortness of breath.   fluticasone-salmeterol (ADVAIR HFA) 115-21 MCG/ACT inhaler, Inhale 2 puffs into the lungs 2 (two) times daily.   Current Outpatient  Medications (Hematological):    cyanocobalamin (VITAMIN B12) 1000 MCG/ML injection, Inject 1 mL (1,000 mcg total) into the muscle once a week.  Current Outpatient Medications (Other):    acyclovir (ZOVIRAX) 400 MG tablet, Take 1 tablet (400 mg total) by mouth 3 (three) times daily as needed. For five days. As needed for cold sore.   B-D 3CC LUER-LOK SYR 25GX1" 25G X 1" 3 ML MISC, USE AS DIRECTED ONCE A WEEK   calcium carbonate (TUMS - DOSED IN MG ELEMENTAL CALCIUM) 500 MG chewable tablet, Chew 1 tablet (200 mg of elemental calcium total) by mouth 3 (three) times daily.   loperamide (IMODIUM) 2 MG capsule, Take 1 capsule (2 mg total) by mouth 2 (two) times daily as needed for diarrhea or loose stools.   Needles & Syringes MISC, 25 ga x 1 " IM needle/syringe   sertraline (ZOLOFT) 50 MG tablet, Take 1/2 tablet (25 mg) daily x 7 days then increase to 1 full tablet (50 mg) daily.  Medical History:  Past Medical History:  Diagnosis Date   Anemia    Asthma    Blood transfusion without reported diagnosis    Breast cancer (HCC)    Dyspnea    over exertion    Hypertension    Hypothyroidism    Stomach cancer (HCC)    Allergies: No Known Allergies   Surgical History:  She  has a past surgical history that includes Abdominal hysterectomy (1996); Carpal tunnel release; Mastectomy (Right, 2007); Esophagogastroduodenoscopy (N/A, 03/29/2018); Colonoscopy (N/A, 03/29/2018); biopsy (03/29/2018); Bilateral salpingoophorectomy (2007); Portacath placement (Left, 04/13/2018); laparoscopy (N/A, 04/13/2018); laparoscopy (N/A, 10/02/2018); Gastrectomy (N/A, 10/02/2018); Gastrostomy (N/A, 10/02/2018); Bariatric Surgery; Colonoscopy; Upper gastrointestinal endoscopy; and Port-a-cath removal (Left, 11/07/2020). Family History:  Her family history includes Cancer in her paternal uncle; Cirrhosis in her mother; Liver cancer in her father.  REVIEW OF SYSTEMS  : All other systems reviewed and negative except where noted in the History of Present Illness.  PHYSICAL EXAM: BP 122/68   Pulse 73   Ht 5' (1.524 m)   Wt 120 lb (54.4 kg)   BMI 23.44 kg/m  General Appearance: Well nourished, in no apparent distress. Head:   Normocephalic and atraumatic. Eyes:  sclerae anicteric,conjunctive pink  Respiratory: Respiratory effort normal, BS equal bilaterally without rales, rhonchi, wheezing. Cardio: RRR with no MRGs. Peripheral pulses intact.  Abdomen: Soft,  Non-distended ,active bowel sounds. mild tenderness in the epigastrium. Without guarding and Without rebound. No masses. Rectal: Not evaluated Musculoskeletal: Full ROM, Normal gait. Without edema. Skin:  Dry and intact without significant lesions or rashes Neuro: Alert and  oriented x4;  No focal deficits. Psych:  Cooperative. Normal mood and affect.    Doree Albee, PA-C 1:55 PM

## 2022-11-06 ENCOUNTER — Encounter: Payer: Self-pay | Admitting: Physician Assistant

## 2022-11-06 ENCOUNTER — Ambulatory Visit (INDEPENDENT_AMBULATORY_CARE_PROVIDER_SITE_OTHER): Payer: Medicare Other | Admitting: Physician Assistant

## 2022-11-06 VITALS — BP 122/68 | HR 73 | Ht 60.0 in | Wt 120.0 lb

## 2022-11-06 DIAGNOSIS — R634 Abnormal weight loss: Secondary | ICD-10-CM

## 2022-11-06 DIAGNOSIS — Z85028 Personal history of other malignant neoplasm of stomach: Secondary | ICD-10-CM | POA: Diagnosis not present

## 2022-11-06 DIAGNOSIS — Z903 Acquired absence of stomach [part of]: Secondary | ICD-10-CM | POA: Diagnosis not present

## 2022-11-06 DIAGNOSIS — E039 Hypothyroidism, unspecified: Secondary | ICD-10-CM

## 2022-11-06 DIAGNOSIS — K219 Gastro-esophageal reflux disease without esophagitis: Secondary | ICD-10-CM

## 2022-11-06 MED ORDER — OMEPRAZOLE 20 MG PO CPDR: DELAYED_RELEASE_CAPSULE | ORAL | 0 refills | Status: DC

## 2022-11-06 NOTE — Patient Instructions (Addendum)
   You have been scheduled for an endoscopy. Please follow written instructions given to you at your visit today.  If you use inhalers (even only as needed), please bring them with you on the day of your procedure.  If you take any of the following medications, they will need to be adjusted prior to your procedure:   DO NOT TAKE 7 DAYS PRIOR TO TEST- Trulicity (dulaglutide) Ozempic, Wegovy (semaglutide) Mounjaro (tirzepatide) Bydureon Bcise (exanatide extended release)  DO NOT TAKE 1 DAY PRIOR TO YOUR TEST Rybelsus (semaglutide) Adlyxin (lixisenatide) Victoza (liraglutide) Byetta (exanatide) ___________________________________________________________________________     Please take your proton pump inhibitor medication, omeprazole 20 mg, can break apart and put into sauce  Please take this medication 30 minutes to 1 hour before meals- this makes it more effective.  Avoid spicy and acidic foods Avoid fatty foods Limit your intake of coffee, tea, alcohol, and carbonated drinks Work to maintain a healthy weight Keep the head of the bed elevated at least 3 inches with blocks or a wedge pillow if you are having any nighttime symptoms Stay upright for 2 hours after eating Avoid meals and snacks three to four hours before bedtime  Eat small frequent meals Soft or liquid proteins.   Abdominal bloating and discomfort may be due to intestinal sensitivity or symptoms of irritable bowel syndrome. To relieve symptoms, avoid:  Broccoli  Baked beans  Cabbage  Carbonated drinks  Cauliflower  Chewing gum  Hard candy Abdominal distention resulting from weak abdominal muscles:  Is better in the morning  Gets worse as the day progresses  Is relieved by lying down Flatulence is gas created through bacterial action in the bowel and passed rectally. Keep in mind that:  10-18 passages per day are normal  Primary gases are harmless and odorless  Noticeable smells are trace gases related to  food intake Foods to AVOID that are likely to form gas include:  Milk, dairy products, and medications that contain lactose--If your body doesn't produce the enzyme (lactase) to break it down.  Certain vegetables--baked beans, cauliflower, broccoli, cabbage  Certain starches--wheat, oats, corn, potatoes. Rice is a good substitute. Identify offending foods. Reduce or eliminate these gas-forming foods from your diet. Can look at the FODMAP diet.

## 2022-11-09 NOTE — Progress Notes (Signed)
____________________________________________________________  Attending physician addendum:  Thank you for sending this case to me. I have reviewed the entire note and agree with the plan.  I am happy to do the endoscopy for further evaluation.  However, I still suspect these problems are postsurgical anatomic and motility issues that are not likely to improve much with acid suppression therapy. I will probably get an upper GI series after the endoscopy to see the anatomic orientation to the a ferret limb and what happens with contrast or tablet that passes near that area.  Nevertheless, there may not be any particular surgical solution given her previous total gastrectomy.  Mariah Jupiter, MD  ____________________________________________________________

## 2022-11-14 ENCOUNTER — Ambulatory Visit (INDEPENDENT_AMBULATORY_CARE_PROVIDER_SITE_OTHER): Payer: Medicare Other | Admitting: Professional

## 2022-11-14 ENCOUNTER — Encounter: Payer: Self-pay | Admitting: Professional

## 2022-11-14 DIAGNOSIS — F4323 Adjustment disorder with mixed anxiety and depressed mood: Secondary | ICD-10-CM | POA: Diagnosis not present
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
## 2022-11-14 NOTE — Progress Notes (Signed)
° ° ° ° ° ° ° ° ° ° ° ° ° ° °  Kathy Collins, LCMHC °

## 2022-11-21 ENCOUNTER — Encounter: Payer: Self-pay | Admitting: Professional

## 2022-11-21 ENCOUNTER — Encounter: Payer: Self-pay | Admitting: Hematology

## 2022-11-21 ENCOUNTER — Ambulatory Visit (INDEPENDENT_AMBULATORY_CARE_PROVIDER_SITE_OTHER): Payer: Medicare Other | Admitting: Professional

## 2022-11-21 DIAGNOSIS — F4323 Adjustment disorder with mixed anxiety and depressed mood: Secondary | ICD-10-CM | POA: Diagnosis not present

## 2022-11-21 NOTE — Progress Notes (Addendum)
[facility] Counselor/Therapist Progress Note  Patient ID: Mariah [place], MRN: [id number],    Date: 11/21/2022  Time Spent: 52 minutes 12-1252pm   Treatment Type: Individual Therapy  Risk Assessment: Danger to Self:  No Self-injurious Behavior: No Danger to Others: No  Subjective: The patient arrived on time for her in-person appointment.  Issues addressed: 1-treatment planning -patient came prepared with what she wishes to accomplish in therapy -patient and Clinician created patient's treatment plan -patient actively participated and agrees with her treatment plan   Treatment Plan Problems Addressed  Anxiety, Family Conflict, Grief / Loss Unresolved, Type A Behavior Goals 1. Achieve an overall decrease in pressured, driven behaviors. 2. Alleviate sense of time urgency, free-floating anxiety, anger, and self-destructive behaviors. 3. Become a reconstituted/blended family unit that is functional and whose members are bonded to each other. 4. Begin a healthy grieving process around husband's Alzheimer's diagnosis. Objective Attend an Alzheimer's support group. Target Date: 2023-11-21 Frequency: Biweekly  Progress: 0 Modality: individual  Related Interventions Ask the client to attend a grief/loss support group and report to the therapist how he/she felt about attending. Objective Verbalize resolution of feelings of guilt and regret associated with cognitively impaired spouse's condition. Target Date: 2023-11-21 Frequency: Biweekly  Progress: 0 Modality: individual  Related Interventions Assign the client to make a list of all the regrets associated with actions toward or relationship with the deceased; process the list content toward resolution of these feelings. Objective Identify and voice positives about the cognitively impaired spouse including previous positive experiences, positive characteristics, positive aspects of the relationship, and how these  things may be remembered. Target Date: 2023-11-21 Frequency: Biweekly  Progress: 0 Modality: individual  Related Interventions Ask the client to discuss and/or list the positive aspects of and memories about his/her relationship with the lost loved one; reinforce the client's expression of positive memories and emotions (e.g., smiling, laughing); encourage the client to share these thoughts with supportive loved ones. Objective Express thoughts and feelings to the cognitively impaired spouse while he is still capable of comprehending Target Date: 2023-11-21 Frequency: Biweekly  Progress: 0 Modality: individual  Related Interventions Ask the client to write a letter to the lost person describing his/her fond memories and/or painful and regretful memories, and how he/she currently feels life (or assign "Dear : A Letter to a Lost Loved One" in the Adult Psychotherapy Homework Planner by [facility]); process the letter in session. Objective Reengage in activities with family, friends, coworkers, and others. Target Date: 2023-11-21 Frequency: Biweekly  Progress: 0 Modality: individual  Related Interventions Assist the client in recommitting and reengaging in the primary social positive roles in which he/she has functioned prior to the loss. Promote behavioral activation by assisting the client in listing activities which he/she previously enjoyed but has not engaged in since experiencing the loss and then encourage reengagement in these activities (or assign "Identify and Schedule Pleasant Activities" in the Adult Psychotherapy Homework Planner by Stephannie Li). Objective Acknowledge dependency on husband and begin to refocus life on independent actions to meet emotional needs. Target Date: 2023-11-21 Frequency: Biweekly  Progress: 0 Modality: individual  Related Interventions Explore the feelings of anger or guilt that surround the loss, helping the client understand the sources for such feelings. Assist  the client in identifying how he/she depended upon the significant other, expressing and resolving the accompanying feelings of abandonment and of being left alone. 5. Begin the process of reengaging a relationship with adult son. Objective Family members report a desire for  and vision of a new sense of connectedness. Target Date: 2023-11-21 Frequency: Biweekly  Progress: 0 Modality: individual  Objective Describe the conflicts and the causes of conflicts between self and adult son. Target Date: 2023-11-21 Frequency: Biweekly  Progress: 0 Modality: individual  Related Interventions Give verbal permission for the client to have and express own feelings, thoughts, and perspectives in order to foster a sense of autonomy from family. Explore the nature of the client's family conflicts and their perceived causes. Objective Identify own as well as others' role in the family conflicts. Target Date: 2023-11-21 Frequency: Biweekly  Progress: 0 Modality: individual  Related Interventions Confront the client when he/she is not taking responsibility for his/her role in the family conflict and reinforce the client for owning responsibility for his/her contribution to the conflict. Objective Identify ways in which the patient's interactions with son could be tempered with assertiveness. Target Date: 2023-11-21 Frequency: Biweekly  Progress: 0 Modality: individual  Related Interventions Assist the parents in identifying areas that need strengthening in their "parental team," then work with them to strengthen these areas (or assign "Learning to Parent as a Team" in the Adult Psychotherapy Administrator, arts by Stephannie Li). Objective Report an increase in resolving conflicts with her adult son by talking calmly and assertively rather than aggressively and defensively. Target Date: 2023-11-21 Frequency: Biweekly  Progress: 0 Modality: individual  Related Interventions Use role-playing, role reversal,  modeling, and behavioral rehearsal to help the client develop assertive ways to resolve conflict with parents (recommend Your Perfect Right: Assertiveness and Equality in Your Life and Relationships by Carnella Guadalajara). 6. Complete the process of accepting husband's impairment and expected loss of memory. 7. Develop an awareness of how the avoidance of grieving has affected life and begin the healing process. 8. Develop social and recreational activities as a routine part of life. 9. Enhance ability to effectively cope with the full variety of life's worries and anxieties. 10. Formulate and implement a new life attitudinal pattern that allows for a more relaxed pattern of living. Objective Learn and implement calming skills as a lifestyle change and to manage pressure situations. Target Date: 2023-11-21 Frequency: Biweekly  Progress: 0 Modality: individual  Related Interventions Assign the client to implement calming techniques in his/her daily life in general and when facing trigger situations; process the results, reinforcing success and provide corrective feedback toward improvement. Objective Verbalize a commitment to learning new approaches managing self, time, and relationships that emphasize the values of inner and other orientation. Target Date: 2023-11-21 Frequency: Biweekly  Progress: 0 Modality: individual  Related Interventions Ask the client to commit to attempting attitude and behavior changes to promote a healthier, less Type A lifestyle; explore with him/her what changes need to be made to become less Type A. Objective Learn and implement respectful assertive communication knowledge and skills to replace insensitive directness or verbal aggression that is controlling. Target Date: 2023-11-21 Frequency: Biweekly  Progress: 0 Modality: individual  Related Interventions Monitor, point out, and reframe the client's actions or verbalizations that reflect a self-centered or  critical approach to others; practice alternatives using behavioral strategies such as modeling, role-playing, and/or role reversal. Train the client in assertive communication with emphasis on recognizing and refraining from aggressive communication (e.g., ignoring of the rights of others) to respectful, assertive communication. Objective Learn problem-solving and/or conflict resolution skills to manage interpersonal problems. Target Date: 2023-11-21 Frequency: Biweekly  Progress: 0 Modality: individual  Related Interventions Teach the client conflict resolution skills (e.g., empathy, active listening, "I  messages," respectful communication, assertiveness without aggression, compromise); use role- play and modeling to apply these skills to current conflicts. Teach the client problem-solving skills (e.g., define the problem specifically, brainstorm options, list the pros and cons of each option, chose and implement an option, evaluate the outcome); use modeling, role-playing, and behavior rehearsal to apply this skill to several current conflicts (or assign "Plan Before Acting" in the Adult Psychotherapy Homework Planner by Stephannie Li). Objective Demonstrate decreased impatience with others by talking of appreciating and understanding the good qualities in others. Target Date: 2023-11-21 Frequency: Biweekly  Progress: 0 Modality: individual  Related Interventions Assign the client to talk to an associate or child, focusing on listening to the other person and learning several good things about that person; process the experience. 11. Learn and implement coping skills that result in a reduction of anxiety and worry, and improved daily functioning. 12. Stabilize anxiety level while increasing ability to function on a daily basis. Objective Verbalize an understanding of the cognitive, physiological, and behavioral components of anxiety and its treatment. Target Date: 2023-11-21 Frequency: Biweekly   Progress: 0 Modality: individual  Related Interventions Discuss how generalized anxiety typically involves excessive worry about unrealistic threats, various bodily expressions of tension, overarousal, and hypervigilance, and avoidance of what is threatening that interact to maintain the problem (see Mastery of Your Anxiety and Worry: Therapist Guide by Renard Matter, and Barlow; Treating Generalized Anxiety Disorder by Rygh and Ida Rogue). Discuss how treatment targets worry, anxiety symptoms, and avoidance to help the client manage worry effectively, reduce overarousal, and eliminate unnecessary avoidance. Objective Learn and implement calming skills to reduce overall anxiety and manage anxiety symptoms. Target Date: 2023-11-21 Frequency: Biweekly  Progress: 0 Modality: individual  Related Interventions Teach the client calming/relaxation skills (e.g., applied relaxation, progressive muscle relaxation, cue controlled relaxation; mindful breathing; biofeedback) and how to discriminate better between relaxation and tension; teach the client how to apply these skills to his/her daily life (e.g., New Directions in Progressive Muscle Relaxation by Marcelyn Ditty, and Hazlett-Stevens; Treating Generalized Anxiety Disorder by Rygh and Ida Rogue). Assign the client to read about progressive muscle relaxation and other calming strategies in relevant books or treatment manuals (e.g., Progressive Relaxation Training by Robb Matar and Alen Blew; Mastery of Your Anxiety and Worry: Workbook by Earlie Counts). Objective Verbalize an understanding of the role that cognitive biases play in excessive irrational worry and persistent anxiety symptoms. Target Date: 2023-11-21 Frequency: Biweekly  Progress: 0 Modality: individual  Related Interventions Discuss examples demonstrating that unrealistic worry typically overestimates the probability of threats and underestimates or overlooks the client's ability  to manage realistic demands (or assign "Past Successful Anxiety Coping" in the Adult Psychotherapy Homework Planner by [facility]). Assist the client in analyzing his/her worries by examining potential biases such as the probability of the negative expectation occurring, the real consequences of it occurring, his/her ability to control the outcome, the worst possible outcome, and his/her ability to accept it (see "Analyze the Probability of a Feared Event" in the Adult Psychotherapy Homework Planner by Stephannie Li; Cognitive Therapy of Anxiety Disorders by Laurence Slate). Objective Learn to accept limitations in life and commit to tolerating, rather than avoiding, unpleasant emotions while accomplishing meaningful goals. Target Date: 2023-11-21 Frequency: Biweekly  Progress: 0 Modality: individual  Related Interventions Use techniques from Acceptance and Commitment Therapy to help client accept uncomfortable realities such as lack of complete control, imperfections, and uncertainty and tolerate unpleasant emotions and thoughts in order to accomplish value-consistent goals. Objective Reestablish a consistent  sleep-wake cycle. Target Date: 2023-11-21 Frequency: Biweekly  Progress: 0 Modality: individual  Related Interventions Teach and implement sleep hygiene practices to help the client reestablish a consistent sleep-wake cycle; review, reinforce success, and provide corrective feedback toward improvement.  Diagnosis:Adjustment disorder with mixed anxiety and depressed mood  Plan:  -patient to increase awareness of "stinking thinking" and try and reframe her thought to a more productive one -meet again on Friday, November 29, 2022 at 8am in-person

## 2022-11-21 NOTE — Progress Notes (Signed)
° ° ° ° ° ° ° ° ° ° ° ° ° ° °  Kathy Collins, LCMHC °

## 2022-11-22 ENCOUNTER — Encounter: Payer: Self-pay | Admitting: Gastroenterology

## 2022-11-29 ENCOUNTER — Encounter: Payer: Self-pay | Admitting: Professional

## 2022-11-29 ENCOUNTER — Ambulatory Visit (INDEPENDENT_AMBULATORY_CARE_PROVIDER_SITE_OTHER): Payer: Medicare Other | Admitting: Professional

## 2022-11-29 DIAGNOSIS — F4323 Adjustment disorder with mixed anxiety and depressed mood: Secondary | ICD-10-CM | POA: Diagnosis not present

## 2022-11-29 NOTE — Progress Notes (Signed)
[facility] Counselor/Therapist Progress Note  Patient ID: Mariah Kemp, MRN: [id number],    Date: 11/29/2022  Time Spent: 54 minutes [id number]   Treatment Type: Individual Therapy  Risk Assessment: Danger to Self:  No Self-injurious Behavior: No Danger to Others: No  Subjective: The patient arrived on time for her in-person appointment.  Issues addressed: 1-homework -patient to increase awareness of "stinking thinking" and try and reframe her thought to a more productive one 2-family history a-abandoned by mother -despite having visited her after she left the family she never felt connected to her mom -she feels anger and hurt that her mother left her and had another family (3 brothers) with another man  -pt and siblings had to raise themselves b-father was her hero -he was a good provider and fulfilled the roles of mother and father for the pt and siblings -pt idolizes him and does not believe anyone will knock him off his pedestal no matter what was said c-when father was dying mother asked to see him and family permitted -she admits to always loving their father -she spent a lot of time on her visit -pt admitted that her mother was her father's great love 3- husband's illness a-pt grieving loss of husband that she has always known since he is already deteriorating with early dementia b-pt's husband accuses her of moving his belongings and she has not and has tried to defend herself resulting in arguing c-pt proud of self for not responding this past week to husband's accusations 4-pt excessive with cleaning a-pt admits that she has always tried to control her environment and had heavy expectation for how it will appear b-pt would become frustrated at children for not cleaning up when requested and would do the chores herself c-pt overstepped her boundaries in the past with her daughter and how she kept her home d-pt doesn't understand why she doesn't  clean the way she was taught -counselor suggested she did, things are not picked up, but that she is not there to do it for her -pt's husband tends to be picking up the house -pt has positive relationship with daughter since she never goes to her home  Treatment Plan Problems Addressed  Anxiety, Family Conflict, Grief / Loss Unresolved, Type A Behavior Goals 1. Achieve an overall decrease in pressured, driven behaviors. 2. Alleviate sense of time urgency, free-floating anxiety, anger, and self-destructive behaviors. 3. Become a reconstituted/blended family unit that is functional and whose members are bonded to each other. 4. Begin a healthy grieving process around husband's Alzheimer's diagnosis. Objective Attend an Alzheimer's support group. Target Date: 2023-11-21 Frequency: Biweekly  Progress: 0 Modality: individual  Related Interventions Ask the client to attend a grief/loss support group and report to the therapist how he/she felt about attending. Objective Verbalize resolution of feelings of guilt and regret associated with cognitively impaired spouse's condition. Target Date: 2023-11-21 Frequency: Biweekly  Progress: 0 Modality: individual  Related Interventions Assign the client to make a list of all the regrets associated with actions toward or relationship with the deceased; process the list content toward resolution of these feelings. Objective Identify and voice positives about the cognitively impaired spouse including previous positive experiences, positive characteristics, positive aspects of the relationship, and how these things may be remembered. Target Date: 2023-11-21 Frequency: Biweekly  Progress: 0 Modality: individual  Related Interventions Ask the client to discuss and/or list the positive aspects of and memories about his/her relationship with the lost loved one; reinforce the client's expression  of positive memories and emotions (e.g., smiling, laughing);  encourage the client to share these thoughts with supportive loved ones. Objective Express thoughts and feelings to the cognitively impaired spouse while he is still capable of comprehending Target Date: 2023-11-21 Frequency: Biweekly  Progress: 0 Modality: individual  Related Interventions Ask the client to write a letter to the lost person describing his/her fond memories and/or painful and regretful memories, and how he/she currently feels life (or assign "Dear : A Letter to a Lost Loved One" in the Adult Psychotherapy Homework Planner by [facility]); process the letter in session. Objective Reengage in activities with family, friends, coworkers, and others. Target Date: 2023-11-21 Frequency: Biweekly  Progress: 0 Modality: individual  Related Interventions Assist the client in recommitting and reengaging in the primary social positive roles in which he/she has functioned prior to the loss. Promote behavioral activation by assisting the client in listing activities which he/she previously enjoyed but has not engaged in since experiencing the loss and then encourage reengagement in these activities (or assign "Identify and Schedule Pleasant Activities" in the Adult Psychotherapy Homework Planner by Stephannie Li). Objective Acknowledge dependency on husband and begin to refocus life on independent actions to meet emotional needs. Target Date: 2023-11-21 Frequency: Biweekly  Progress: 0 Modality: individual  Related Interventions Explore the feelings of anger or guilt that surround the loss, helping the client understand the sources for such feelings. Assist the client in identifying how he/she depended upon the significant other, expressing and resolving the accompanying feelings of abandonment and of being left alone. 5. Begin the process of reengaging a relationship with adult son. Objective Family members report a desire for and vision of a new sense of connectedness. Target Date: 2023-11-21  Frequency: Biweekly  Progress: 0 Modality: individual  Objective Describe the conflicts and the causes of conflicts between self and adult son. Target Date: 2023-11-21 Frequency: Biweekly  Progress: 0 Modality: individual  Related Interventions Give verbal permission for the client to have and express own feelings, thoughts, and perspectives in order to foster a sense of autonomy from family. Explore the nature of the client's family conflicts and their perceived causes. Objective Identify own as well as others' role in the family conflicts. Target Date: 2023-11-21 Frequency: Biweekly  Progress: 0 Modality: individual  Related Interventions Confront the client when he/she is not taking responsibility for his/her role in the family conflict and reinforce the client for owning responsibility for his/her contribution to the conflict. Objective Identify ways in which the patient's interactions with son could be tempered with assertiveness. Target Date: 2023-11-21 Frequency: Biweekly  Progress: 0 Modality: individual  Related Interventions Assist the parents in identifying areas that need strengthening in their "parental team," then work with them to strengthen these areas (or assign "Learning to Parent as a Team" in the Adult Psychotherapy Administrator, arts by Stephannie Li). Objective Report an increase in resolving conflicts with her adult son by talking calmly and assertively rather than aggressively and defensively. Target Date: 2023-11-21 Frequency: Biweekly  Progress: 0 Modality: individual  Related Interventions Use role-playing, role reversal, modeling, and behavioral rehearsal to help the client develop assertive ways to resolve conflict with parents (recommend Your Perfect Right: Assertiveness and Equality in Your Life and Relationships by Carnella Guadalajara). 6. Complete the process of accepting husband's impairment and expected loss of memory. 7. Develop an awareness of how the avoidance  of grieving has affected life and begin the healing process. 8. Develop social and recreational activities as a routine part of life.  9. Enhance ability to effectively cope with the full variety of life's worries and anxieties. 10. Formulate and implement a new life attitudinal pattern that allows for a more relaxed pattern of living. Objective Learn and implement calming skills as a lifestyle change and to manage pressure situations. Target Date: 2023-11-21 Frequency: Biweekly  Progress: 0 Modality: individual  Related Interventions Assign the client to implement calming techniques in his/her daily life in general and when facing trigger situations; process the results, reinforcing success and provide corrective feedback toward improvement. Objective Verbalize a commitment to learning new approaches managing self, time, and relationships that emphasize the values of inner and other orientation. Target Date: 2023-11-21 Frequency: Biweekly  Progress: 0 Modality: individual  Related Interventions Ask the client to commit to attempting attitude and behavior changes to promote a healthier, less Type A lifestyle; explore with him/her what changes need to be made to become less Type A. Objective Learn and implement respectful assertive communication knowledge and skills to replace insensitive directness or verbal aggression that is controlling. Target Date: 2023-11-21 Frequency: Biweekly  Progress: 0 Modality: individual  Related Interventions Monitor, point out, and reframe the client's actions or verbalizations that reflect a self-centered or critical approach to others; practice alternatives using behavioral strategies such as modeling, role-playing, and/or role reversal. Train the client in assertive communication with emphasis on recognizing and refraining from aggressive communication (e.g., ignoring of the rights of others) to respectful, assertive communication. Objective Learn  problem-solving and/or conflict resolution skills to manage interpersonal problems. Target Date: 2023-11-21 Frequency: Biweekly  Progress: 0 Modality: individual  Related Interventions Teach the client conflict resolution skills (e.g., empathy, active listening, "I messages," respectful communication, assertiveness without aggression, compromise); use role- play and modeling to apply these skills to current conflicts. Teach the client problem-solving skills (e.g., define the problem specifically, brainstorm options, list the pros and cons of each option, chose and implement an option, evaluate the outcome); use modeling, role-playing, and behavior rehearsal to apply this skill to several current conflicts (or assign "Plan Before Acting" in the Adult Psychotherapy Homework Planner by Stephannie Li). Objective Demonstrate decreased impatience with others by talking of appreciating and understanding the good qualities in others. Target Date: 2023-11-21 Frequency: Biweekly  Progress: 0 Modality: individual  Related Interventions Assign the client to talk to an associate or child, focusing on listening to the other person and learning several good things about that person; process the experience. 11. Learn and implement coping skills that result in a reduction of anxiety and worry, and improved daily functioning. 12. Stabilize anxiety level while increasing ability to function on a daily basis. Objective Verbalize an understanding of the cognitive, physiological, and behavioral components of anxiety and its treatment. Target Date: 2023-11-21 Frequency: Biweekly  Progress: 0 Modality: individual  Related Interventions Discuss how generalized anxiety typically involves excessive worry about unrealistic threats, various bodily expressions of tension, overarousal, and hypervigilance, and avoidance of what is threatening that interact to maintain the problem (see Mastery of Your Anxiety and Worry: Therapist Guide  by Renard Matter, and Barlow; Treating Generalized Anxiety Disorder by Rygh and Ida Rogue). Discuss how treatment targets worry, anxiety symptoms, and avoidance to help the client manage worry effectively, reduce overarousal, and eliminate unnecessary avoidance. Objective Learn and implement calming skills to reduce overall anxiety and manage anxiety symptoms. Target Date: 2023-11-21 Frequency: Biweekly  Progress: 0 Modality: individual  Related Interventions Teach the client calming/relaxation skills (e.g., applied relaxation, progressive muscle relaxation, cue controlled relaxation; mindful breathing; biofeedback) and how to  discriminate better between relaxation and tension; teach the client how to apply these skills to his/her daily life (e.g., New Directions in Progressive Muscle Relaxation by Marcelyn Ditty, and Hazlett-Stevens; Treating Generalized Anxiety Disorder by Rygh and Ida Rogue). Assign the client to read about progressive muscle relaxation and other calming strategies in relevant books or treatment manuals (e.g., Progressive Relaxation Training by Robb Matar and Alen Blew; Mastery of Your Anxiety and Worry: Workbook by Earlie Counts). Objective Verbalize an understanding of the role that cognitive biases play in excessive irrational worry and persistent anxiety symptoms. Target Date: 2023-11-21 Frequency: Biweekly  Progress: 0 Modality: individual  Related Interventions Discuss examples demonstrating that unrealistic worry typically overestimates the probability of threats and underestimates or overlooks the client's ability to manage realistic demands (or assign "Past Successful Anxiety Coping" in the Adult Psychotherapy Homework Planner by [facility]). Assist the client in analyzing his/her worries by examining potential biases such as the probability of the negative expectation occurring, the real consequences of it occurring, his/her ability to control the outcome, the worst  possible outcome, and his/her ability to accept it (see "Analyze the Probability of a Feared Event" in the Adult Psychotherapy Homework Planner by Stephannie Li; Cognitive Therapy of Anxiety Disorders by Laurence Slate). Objective Learn to accept limitations in life and commit to tolerating, rather than avoiding, unpleasant emotions while accomplishing meaningful goals. Target Date: 2023-11-21 Frequency: Biweekly  Progress: 0 Modality: individual  Related Interventions Use techniques from Acceptance and Commitment Therapy to help client accept uncomfortable realities such as lack of complete control, imperfections, and uncertainty and tolerate unpleasant emotions and thoughts in order to accomplish value-consistent goals. Objective Reestablish a consistent sleep-wake cycle. Target Date: 2023-11-21 Frequency: Biweekly  Progress: 0 Modality: individual  Related Interventions Teach and implement sleep hygiene practices to help the client reestablish a consistent sleep-wake cycle; review, reinforce success, and provide corrective feedback toward improvement.  Diagnosis:Adjustment disorder with mixed anxiety and depressed mood  Plan:  -continue engaging self-control when responding to your husband -meet again on Thursday, December 12, 2022 at 2pm in-person

## 2022-12-05 ENCOUNTER — Encounter: Payer: Self-pay | Admitting: Gastroenterology

## 2022-12-05 ENCOUNTER — Ambulatory Visit (AMBULATORY_SURGERY_CENTER): Payer: Medicare Other | Admitting: Gastroenterology

## 2022-12-05 VITALS — BP 148/65 | HR 57 | Temp 98.0°F | Resp 17 | Ht 60.0 in | Wt 120.0 lb

## 2022-12-05 DIAGNOSIS — R1110 Vomiting, unspecified: Secondary | ICD-10-CM

## 2022-12-05 DIAGNOSIS — Z903 Acquired absence of stomach [part of]: Secondary | ICD-10-CM

## 2022-12-05 DIAGNOSIS — K222 Esophageal obstruction: Secondary | ICD-10-CM | POA: Diagnosis not present

## 2022-12-05 DIAGNOSIS — K219 Gastro-esophageal reflux disease without esophagitis: Secondary | ICD-10-CM

## 2022-12-05 DIAGNOSIS — Z980 Intestinal bypass and anastomosis status: Secondary | ICD-10-CM | POA: Diagnosis not present

## 2022-12-05 MED ORDER — SODIUM CHLORIDE 0.9 % IV SOLN: 500.0000 mL | INTRAVENOUS | Status: DC

## 2022-12-05 NOTE — Progress Notes (Signed)
No changes to clinical history since GI office visit on 11/06/22.  The patient is appropriate for an endoscopic procedure in the ambulatory setting.  - Amada Jupiter, MD

## 2022-12-05 NOTE — Progress Notes (Signed)
A/O x 3, gd SR's, VSS, report to RN

## 2022-12-05 NOTE — Op Note (Signed)
[facility] Patient Name: Mariah Kemp Procedure Date: 12/05/2022 9:56 AM MRN: [id number] Endoscopist: Sherilyn Cooter L. 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                           Patient experiencing frequent and severe                            regurgitation Medicines:                Monitored Anesthesia Care Procedure:                Pre-Anesthesia Assessment:                           - Prior to the procedure, a History and Physical                            was performed, and patient medications and                            allergies were reviewed. The patient's tolerance of                            previous anesthesia was also reviewed. The risks                            and benefits of the procedure and the sedation                            options and risks were discussed with the patient.                            All questions were answered, and informed consent                            was obtained. Prior Anticoagulants: The patient has                            taken no anticoagulant or antiplatelet agents. ASA                            Grade Assessment: III - A patient with severe  systemic disease. After reviewing the risks and                            benefits, the patient was deemed in satisfactory                            condition to undergo the procedure.                           After obtaining informed consent, the endoscope was                            passed under direct vision. Throughout the                            procedure, the patient's blood pressure, pulse,  and                            oxygen saturations were monitored continuously. The                            GIF [id number] #[id number] was introduced through the                            mouth, and advanced to the efferent jejunal loop.                            The upper GI endoscopy was accomplished without                            difficulty. The patient tolerated the procedure                            well. Scope In: Scope Out: Findings:                 An esophago-jejunal anastomosis was found in the                            distal esophagus. It was healthy-appearing and                            widely patent. The anastomosis was an end to side                            anatomy with a "candycane" appearance of a blind                            limb and then passage to a normal-appearing                            jejunum. The scope could be advanced 15 to 20 cm  distal to the EJ anastomosis and no further due to                            typical small bowel tortuosity and mobility. This                            lumen did not have apparent dilatation, nor were                            there any retained contents.                           The examined jejunum was normal. Complications:            No immediate complications. Estimated Blood Loss:     Estimated blood loss: none. Impression:               - An esophago-jejunal anastomosis was found.                           - Normal examined jejunum.                           - No specimens collected. Recommendation:           - Patient has a contact number available for                            emergencies. The signs and symptoms of potential                            delayed complications were discussed with the                            patient. Return to normal activities tomorrow.                            Written discharge instructions were provided to the                             patient.                           - Resume previous diet.                           - Any acid suppression medicines can be                            discontinued, as they have no role in a patient                            with a prior total gastrectomy. The burning                            sensation described is due to the regurgitation of  bile and other small bowel contents. Fortunately,                            it is not causing any esophagitis or stricture.                           Maalox can be taken as needed for to help coat the                            esophagus and relieve the symptoms to some degree                            when they occur.                           Keep head elevated at least 30 degrees at all                            times, stay upright as long as possible after                            consuming food or drink.                           We will schedule an upper GI series with complete                            small bowel follow-through to evaluate the jejunum                            for any apparent anatomic abnormalities that may be                            contributing to this patient's regurgitation (?                            Short roux limb, stricture, candycane syndrome) Sherilyn Cooter L. Myrtie Neither, MD 12/05/2022 10:41:57 AM This report has been signed electronically.

## 2022-12-05 NOTE — Patient Instructions (Addendum)
Patient has a contact number available for                            emergencies. The signs and symptoms of potential                            delayed complications were discussed with the                            patient. Return to normal activities tomorrow.                            Written discharge instructions were provided to the                            patient.                           - Resume previous diet.                           - Any acid suppression medicines can be                            discontinued, as they have no role in a patient                            with a prior total gastrectomy. The burning                            sensation described is due to the regurgitation of                            bile and other small bowel contents. Fortunately,                            it is not causing any esophagitis or stricture.                           Maalox can be taken as needed for to help coat the                            esophagus and relieve the symptoms to some degree                            when they occur.                           Keep head elevated at least 30 degrees at all                            times, stay upright as long as possible after  consuming food or drink.                           We will schedule an upper GI series with complete                            small bowel follow-through to evaluate the jejunum                            for any apparent anatomic abnormalities that may be                            contributing to this patient's regurgitation (?                            Short roux limb, stricture, candycane syndrome)         Document Information        YOU HAD AN ENDOSCOPIC PROCEDURE TODAY AT THE [facility]:   Refer to the procedure report that was given to you for any specific questions about what was found during the examination.  If the procedure report does  not answer your questions, please call your gastroenterologist to clarify.  If you requested that your care partner not be given the details of your procedure findings, then the procedure report has been included in a sealed envelope for you to review at your convenience later.  YOU SHOULD EXPECT: Some feelings of bloating in the abdomen. Passage of more gas than usual.  Walking can help get rid of the air that was put into your GI tract during the procedure and reduce the bloating. If you had a lower endoscopy (such as a colonoscopy or flexible sigmoidoscopy) you may notice spotting of blood in your stool or on the toilet paper. If you underwent a bowel prep for your procedure, you may not have a normal bowel movement for a few days.  Please Note:  You might notice some irritation and congestion in your nose or some drainage.  This is from the oxygen used during your procedure.  There is no need for concern and it should clear up in a day or so.  SYMPTOMS TO REPORT IMMEDIATELY:  Following upper endoscopy (EGD)  Vomiting of blood or coffee ground material  New chest pain or pain under the shoulder blades  Painful or persistently difficult swallowing  New shortness of breath  Fever of 100F or higher  Black, tarry-looking stools  For urgent or emergent issues, a gastroenterologist can be reached at any hour by calling [phone]. Do not use MyChart messaging for urgent concerns.    DIET:  We do recommend a small meal at first, but then you may proceed to your regular diet.  Drink plenty of fluids but you should avoid alcoholic beverages for 24 hours.  ACTIVITY:  You should plan to take it easy for the rest of today and you should NOT DRIVE or use heavy machinery until tomorrow (because of the sedation medicines used during the test).    FOLLOW UP: Our staff will call the number listed on your records the next business day following your procedure.  We will call around 7:15- 8:00 am to  check on you and address any questions or concerns that you may  have regarding the information given to you following your procedure. If we do not reach you, we will leave a message.     If any biopsies were taken you will be contacted by phone or by letter within the next 1-3 weeks.  Please call us at [phone] if you have not heard about the biopsies in 3 weeks.    SIGNATURES/CONFIDENTIALITY: You and/or your care partner have signed paperwork which will be entered into your electronic medical record.  These signatures attest to the fact that that the information above on your After Visit Summary has been reviewed and is understood.  Full responsibility of the confidentiality of this discharge information lies with you and/or your care-partner.

## 2022-12-09 ENCOUNTER — Telehealth: Payer: Self-pay

## 2022-12-09 ENCOUNTER — Other Ambulatory Visit: Payer: Self-pay

## 2022-12-09 DIAGNOSIS — Z85028 Personal history of other malignant neoplasm of stomach: Secondary | ICD-10-CM

## 2022-12-09 DIAGNOSIS — R1110 Vomiting, unspecified: Secondary | ICD-10-CM

## 2022-12-09 DIAGNOSIS — K219 Gastro-esophageal reflux disease without esophagitis: Secondary | ICD-10-CM

## 2022-12-09 DIAGNOSIS — Z903 Acquired absence of stomach [part of]: Secondary | ICD-10-CM

## 2022-12-09 NOTE — Telephone Encounter (Signed)
No answer, left message to call if having any issues or concerns, B.Lj Miyamoto RN 

## 2022-12-12 ENCOUNTER — Ambulatory Visit: Admitting: Professional

## 2022-12-18 ENCOUNTER — Other Ambulatory Visit: Payer: Self-pay | Admitting: Gastroenterology

## 2022-12-18 ENCOUNTER — Ambulatory Visit (HOSPITAL_COMMUNITY)
Admission: RE | Admit: 2022-12-18 | Discharge: 2022-12-18 | Disposition: A | Discharge status: Home / Self Care | Payer: Medicare Other | Source: Ambulatory Visit | Attending: Gastroenterology | Admitting: Gastroenterology

## 2022-12-18 DIAGNOSIS — Z903 Acquired absence of stomach [part of]: Secondary | ICD-10-CM | POA: Insufficient documentation

## 2022-12-18 DIAGNOSIS — K219 Gastro-esophageal reflux disease without esophagitis: Secondary | ICD-10-CM

## 2022-12-18 DIAGNOSIS — R1110 Vomiting, unspecified: Secondary | ICD-10-CM | POA: Diagnosis present

## 2022-12-18 DIAGNOSIS — Z980 Intestinal bypass and anastomosis status: Secondary | ICD-10-CM | POA: Insufficient documentation

## 2022-12-18 DIAGNOSIS — Z85028 Personal history of other malignant neoplasm of stomach: Secondary | ICD-10-CM | POA: Insufficient documentation

## 2022-12-20 ENCOUNTER — Encounter: Payer: Self-pay | Admitting: Gastroenterology

## 2022-12-26 ENCOUNTER — Encounter: Payer: Self-pay | Admitting: Gastroenterology

## 2022-12-27 ENCOUNTER — Encounter: Payer: Self-pay | Admitting: Professional

## 2022-12-27 ENCOUNTER — Ambulatory Visit: Payer: Medicare Other | Admitting: Professional

## 2022-12-27 DIAGNOSIS — F4323 Adjustment disorder with mixed anxiety and depressed mood: Secondary | ICD-10-CM

## 2022-12-27 NOTE — Progress Notes (Signed)
[facility] Counselor/Therapist Progress Note  Patient ID: Mariah Kemp, MRN: [id number],    Date: 12/27/2022  Time Spent: 57 minutes 803-9am   Treatment Type: Individual Therapy  Risk Assessment: Danger to Self:  No Self-injurious Behavior: No Danger to Others: No  Subjective: The patient arrived on time for her in-person appointment.  Issues addressed: 1-homework-doing better -talks herself through it -lets him make his decision -continue engaging self-control when responding to your husband 2-spouse -feels guilty when leaves home  -he doesn't want to go all the time and worries he will get hurt -provide space for self-care time for you and for him -try and develop a few activities to do as a couple outside of the home 3-anger -how it impacts relationships -how to identify triggers -educated -examples  Treatment Plan Problems Addressed  Anxiety, Family Conflict, Grief / Loss Unresolved, Type A Behavior Goals 1. Achieve an overall decrease in pressured, driven behaviors. 2. Alleviate sense of time urgency, free-floating anxiety, anger, and self-destructive behaviors. 3. Become a reconstituted/blended family unit that is functional and whose members are bonded to each other. 4. Begin a healthy grieving process around husband's Alzheimer's diagnosis. Objective Attend an Alzheimer's support group. Target Date: 2023-11-21 Frequency: Biweekly  Progress: 0 Modality: individual  Related Interventions Ask the client to attend a grief/loss support group and report to the therapist how he/she felt about attending. Objective Verbalize resolution of feelings of guilt and regret associated with cognitively impaired spouse's condition. Target Date: 2023-11-21 Frequency: Biweekly  Progress: 0 Modality: individual  Related Interventions Assign the client to make a list of all the regrets associated with actions toward or relationship with the deceased; process  the list content toward resolution of these feelings. Objective Identify and voice positives about the cognitively impaired spouse including previous positive experiences, positive characteristics, positive aspects of the relationship, and how these things may be remembered. Target Date: 2023-11-21 Frequency: Biweekly  Progress: 0 Modality: individual  Related Interventions Ask the client to discuss and/or list the positive aspects of and memories about his/her relationship with the lost loved one; reinforce the client's expression of positive memories and emotions (e.g., smiling, laughing); encourage the client to share these thoughts with supportive loved ones. Objective Express thoughts and feelings to the cognitively impaired spouse while he is still capable of comprehending Target Date: 2023-11-21 Frequency: Biweekly  Progress: 0 Modality: individual  Related Interventions Ask the client to write a letter to the lost person describing his/her fond memories and/or painful and regretful memories, and how he/she currently feels life (or assign "Dear : A Letter to a Lost Loved One" in the Adult Psychotherapy Homework Planner by [facility]); process the letter in session. Objective Reengage in activities with family, friends, coworkers, and others. Target Date: 2023-11-21 Frequency: Biweekly  Progress: 0 Modality: individual  Related Interventions Assist the client in recommitting and reengaging in the primary social positive roles in which he/she has functioned prior to the loss. Promote behavioral activation by assisting the client in listing activities which he/she previously enjoyed but has not engaged in since experiencing the loss and then encourage reengagement in these activities (or assign "Identify and Schedule Pleasant Activities" in the Adult Psychotherapy Homework Planner by Stephannie Li). Objective Acknowledge dependency on husband and begin to refocus life on independent actions to meet  emotional needs. Target Date: 2023-11-21 Frequency: Biweekly  Progress: 0 Modality: individual  Related Interventions Explore the feelings of anger or guilt that surround the loss, helping the client understand the sources  for such feelings. Assist the client in identifying how he/she depended upon the significant other, expressing and resolving the accompanying feelings of abandonment and of being left alone. 5. Begin the process of reengaging a relationship with adult son. Objective Family members report a desire for and vision of a new sense of connectedness. Target Date: 2023-11-21 Frequency: Biweekly  Progress: 0 Modality: individual  Objective Describe the conflicts and the causes of conflicts between self and adult son. Target Date: 2023-11-21 Frequency: Biweekly  Progress: 0 Modality: individual  Related Interventions Give verbal permission for the client to have and express own feelings, thoughts, and perspectives in order to foster a sense of autonomy from family. Explore the nature of the client's family conflicts and their perceived causes. Objective Identify own as well as others' role in the family conflicts. Target Date: 2023-11-21 Frequency: Biweekly  Progress: 0 Modality: individual  Related Interventions Confront the client when he/she is not taking responsibility for his/her role in the family conflict and reinforce the client for owning responsibility for his/her contribution to the conflict. Objective Identify ways in which the patient's interactions with son could be tempered with assertiveness. Target Date: 2023-11-21 Frequency: Biweekly  Progress: 0 Modality: individual  Related Interventions Assist the parents in identifying areas that need strengthening in their "parental team," then work with them to strengthen these areas (or assign "Learning to Parent as a Team" in the Adult Psychotherapy Administrator, arts by Stephannie Li). Objective Report an increase in  resolving conflicts with her adult son by talking calmly and assertively rather than aggressively and defensively. Target Date: 2023-11-21 Frequency: Biweekly  Progress: 0 Modality: individual  Related Interventions Use role-playing, role reversal, modeling, and behavioral rehearsal to help the client develop assertive ways to resolve conflict with parents (recommend Your Perfect Right: Assertiveness and Equality in Your Life and Relationships by Carnella Guadalajara). 6. Complete the process of accepting husband's impairment and expected loss of memory. 7. Develop an awareness of how the avoidance of grieving has affected life and begin the healing process. 8. Develop social and recreational activities as a routine part of life. 9. Enhance ability to effectively cope with the full variety of life's worries and anxieties. 10. Formulate and implement a new life attitudinal pattern that allows for a more relaxed pattern of living. Objective Learn and implement calming skills as a lifestyle change and to manage pressure situations. Target Date: 2023-11-21 Frequency: Biweekly  Progress: 0 Modality: individual  Related Interventions Assign the client to implement calming techniques in his/her daily life in general and when facing trigger situations; process the results, reinforcing success and provide corrective feedback toward improvement. Objective Verbalize a commitment to learning new approaches managing self, time, and relationships that emphasize the values of inner and other orientation. Target Date: 2023-11-21 Frequency: Biweekly  Progress: 0 Modality: individual  Related Interventions Ask the client to commit to attempting attitude and behavior changes to promote a healthier, less Type A lifestyle; explore with him/her what changes need to be made to become less Type A. Objective Learn and implement respectful assertive communication knowledge and skills to replace insensitive directness or  verbal aggression that is controlling. Target Date: 2023-11-21 Frequency: Biweekly  Progress: 0 Modality: individual  Related Interventions Monitor, point out, and reframe the client's actions or verbalizations that reflect a self-centered or critical approach to others; practice alternatives using behavioral strategies such as modeling, role-playing, and/or role reversal. Train the client in assertive communication with emphasis on recognizing and refraining from aggressive communication (  e.g., ignoring of the rights of others) to respectful, assertive communication. Objective Learn problem-solving and/or conflict resolution skills to manage interpersonal problems. Target Date: 2023-11-21 Frequency: Biweekly  Progress: 0 Modality: individual  Related Interventions Teach the client conflict resolution skills (e.g., empathy, active listening, "I messages," respectful communication, assertiveness without aggression, compromise); use role- play and modeling to apply these skills to current conflicts. Teach the client problem-solving skills (e.g., define the problem specifically, brainstorm options, list the pros and cons of each option, chose and implement an option, evaluate the outcome); use modeling, role-playing, and behavior rehearsal to apply this skill to several current conflicts (or assign "Plan Before Acting" in the Adult Psychotherapy Homework Planner by Stephannie Li). Objective Demonstrate decreased impatience with others by talking of appreciating and understanding the good qualities in others. Target Date: 2023-11-21 Frequency: Biweekly  Progress: 0 Modality: individual  Related Interventions Assign the client to talk to an associate or child, focusing on listening to the other person and learning several good things about that person; process the experience. 11. Learn and implement coping skills that result in a reduction of anxiety and worry, and improved daily functioning. 12. Stabilize  anxiety level while increasing ability to function on a daily basis. Objective Verbalize an understanding of the cognitive, physiological, and behavioral components of anxiety and its treatment. Target Date: 2023-11-21 Frequency: Biweekly  Progress: 0 Modality: individual  Related Interventions Discuss how generalized anxiety typically involves excessive worry about unrealistic threats, various bodily expressions of tension, overarousal, and hypervigilance, and avoidance of what is threatening that interact to maintain the problem (see Mastery of Your Anxiety and Worry: Therapist Guide by Renard Matter, and Barlow; Treating Generalized Anxiety Disorder by Rygh and Ida Rogue). Discuss how treatment targets worry, anxiety symptoms, and avoidance to help the client manage worry effectively, reduce overarousal, and eliminate unnecessary avoidance. Objective Learn and implement calming skills to reduce overall anxiety and manage anxiety symptoms. Target Date: 2023-11-21 Frequency: Biweekly  Progress: 0 Modality: individual  Related Interventions Teach the client calming/relaxation skills (e.g., applied relaxation, progressive muscle relaxation, cue controlled relaxation; mindful breathing; biofeedback) and how to discriminate better between relaxation and tension; teach the client how to apply these skills to his/her daily life (e.g., New Directions in Progressive Muscle Relaxation by Marcelyn Ditty, and Hazlett-Stevens; Treating Generalized Anxiety Disorder by Rygh and Ida Rogue). Assign the client to read about progressive muscle relaxation and other calming strategies in relevant books or treatment manuals (e.g., Progressive Relaxation Training by Robb Matar and Alen Blew; Mastery of Your Anxiety and Worry: Workbook by Earlie Counts). Objective Verbalize an understanding of the role that cognitive biases play in excessive irrational worry and persistent anxiety symptoms. Target Date:  2023-11-21 Frequency: Biweekly  Progress: 0 Modality: individual  Related Interventions Discuss examples demonstrating that unrealistic worry typically overestimates the probability of threats and underestimates or overlooks the client's ability to manage realistic demands (or assign "Past Successful Anxiety Coping" in the Adult Psychotherapy Homework Planner by [facility]). Assist the client in analyzing his/her worries by examining potential biases such as the probability of the negative expectation occurring, the real consequences of it occurring, his/her ability to control the outcome, the worst possible outcome, and his/her ability to accept it (see "Analyze the Probability of a Feared Event" in the Adult Psychotherapy Homework Planner by Stephannie Li; Cognitive Therapy of Anxiety Disorders by Laurence Slate). Objective Learn to accept limitations in life and commit to tolerating, rather than avoiding, unpleasant emotions while accomplishing meaningful goals. Target Date: 2023-11-21 Frequency: Biweekly  Progress: 0 Modality: individual  Related Interventions Use techniques from Acceptance and Commitment Therapy to help client accept uncomfortable realities such as lack of complete control, imperfections, and uncertainty and tolerate unpleasant emotions and thoughts in order to accomplish value-consistent goals. Objective Reestablish a consistent sleep-wake cycle. Target Date: 2023-11-21 Frequency: Biweekly  Progress: 0 Modality: individual  Related Interventions Teach and implement sleep hygiene practices to help the client reestablish a consistent sleep-wake cycle; review, reinforce success, and provide corrective feedback toward improvement.  Diagnosis:Adjustment disorder with mixed anxiety and depressed mood  Plan:  -use I statements -increase awareness of anger triggers and responses and change responses -meet again on Thursday, December 12, 2022 at 2pm in-person

## 2023-01-02 ENCOUNTER — Ambulatory Visit: Payer: Medicare Other | Admitting: Professional

## 2023-01-16 ENCOUNTER — Encounter: Payer: Self-pay | Admitting: Medical-Surgical

## 2023-01-16 ENCOUNTER — Encounter: Payer: Self-pay | Admitting: Internal Medicine

## 2023-01-16 MED ORDER — ALBUTEROL SULFATE HFA 108 (90 BASE) MCG/ACT IN AERS: 1.0000 | INHALATION_SPRAY | RESPIRATORY_TRACT | 1 refills | Status: DC | PRN

## 2023-01-17 ENCOUNTER — Ambulatory Visit: Payer: Medicare Other | Admitting: Professional

## 2023-01-17 ENCOUNTER — Encounter: Payer: Self-pay | Admitting: Professional

## 2023-01-17 DIAGNOSIS — F4323 Adjustment disorder with mixed anxiety and depressed mood: Secondary | ICD-10-CM

## 2023-01-17 NOTE — Progress Notes (Signed)
[facility] Counselor/Therapist Progress Note  Patient ID: Mariah Kemp, MRN: [id number],    Date: 01/17/2023  Time Spent: 51 minutes [id number]   Treatment Type: Individual Therapy  Risk Assessment: Danger to Self:  No Self-injurious Behavior: No Danger to Others: No  Subjective: The patient arrived on time for her in-person appointment.  Issues addressed: 1-homework- completed -use I statements -increase awareness of anger triggers and responses and change responses 2-spouse -pt is handling her husband's limitations in a more effect way. -pt's husband has verbalized how good it feels to have alone time and pt agreed. 3-managing her feelings -pt admits to feeling guilt for wanting to do things outside the home without her -pt understands his desire for alone time and wants that for herself and for her too  Treatment Plan Problems Addressed  Anxiety, Family Conflict, Grief / Loss Unresolved, Type A Behavior Goals 1. Achieve an overall decrease in pressured, driven behaviors. 2. Alleviate sense of time urgency, free-floating anxiety, anger, and self-destructive behaviors. 3. Become a reconstituted/blended family unit that is functional and whose members are bonded to each other. 4. Begin a healthy grieving process around husband's Alzheimer's diagnosis. Objective Attend an Alzheimer's support group. Target Date: 2023-11-21 Frequency: Biweekly  Progress: 0 Modality: individual  Related Interventions Ask the client to attend a grief/loss support group and report to the therapist how he/she felt about attending. Objective Verbalize resolution of feelings of guilt and regret associated with cognitively impaired spouse's condition. Target Date: 2023-11-21 Frequency: Biweekly  Progress: 0 Modality: individual  Related Interventions Assign the client to make a list of all the regrets associated with actions toward or relationship with the deceased; process the  list content toward resolution of these feelings. Objective Identify and voice positives about the cognitively impaired spouse including previous positive experiences, positive characteristics, positive aspects of the relationship, and how these things may be remembered. Target Date: 2023-11-21 Frequency: Biweekly  Progress: 0 Modality: individual  Related Interventions Ask the client to discuss and/or list the positive aspects of and memories about his/her relationship with the lost loved one; reinforce the client's expression of positive memories and emotions (e.g., smiling, laughing); encourage the client to share these thoughts with supportive loved ones. Objective Express thoughts and feelings to the cognitively impaired spouse while he is still capable of comprehending Target Date: 2023-11-21 Frequency: Biweekly  Progress: 0 Modality: individual  Related Interventions Ask the client to write a letter to the lost person describing his/her fond memories and/or painful and regretful memories, and how he/she currently feels life (or assign "Dear : A Letter to a Lost Loved One" in the Adult Psychotherapy Homework Planner by [facility]); process the letter in session. Objective Reengage in activities with family, friends, coworkers, and others. Target Date: 2023-11-21 Frequency: Biweekly  Progress: 0 Modality: individual  Related Interventions Assist the client in recommitting and reengaging in the primary social positive roles in which he/she has functioned prior to the loss. Promote behavioral activation by assisting the client in listing activities which he/she previously enjoyed but has not engaged in since experiencing the loss and then encourage reengagement in these activities (or assign "Identify and Schedule Pleasant Activities" in the Adult Psychotherapy Homework Planner by Stephannie Li). Objective Acknowledge dependency on husband and begin to refocus life on independent actions to meet  emotional needs. Target Date: 2023-11-21 Frequency: Biweekly  Progress: 0 Modality: individual  Related Interventions Explore the feelings of anger or guilt that surround the loss, helping the client understand the sources  for such feelings. Assist the client in identifying how he/she depended upon the significant other, expressing and resolving the accompanying feelings of abandonment and of being left alone. 5. Begin the process of reengaging a relationship with adult son. Objective Family members report a desire for and vision of a new sense of connectedness. Target Date: 2023-11-21 Frequency: Biweekly  Progress: 0 Modality: individual  Objective Describe the conflicts and the causes of conflicts between self and adult son. Target Date: 2023-11-21 Frequency: Biweekly  Progress: 0 Modality: individual  Related Interventions Give verbal permission for the client to have and express own feelings, thoughts, and perspectives in order to foster a sense of autonomy from family. Explore the nature of the client's family conflicts and their perceived causes. Objective Identify own as well as others' role in the family conflicts. Target Date: 2023-11-21 Frequency: Biweekly  Progress: 0 Modality: individual  Related Interventions Confront the client when he/she is not taking responsibility for his/her role in the family conflict and reinforce the client for owning responsibility for his/her contribution to the conflict. Objective Identify ways in which the patient's interactions with son could be tempered with assertiveness. Target Date: 2023-11-21 Frequency: Biweekly  Progress: 0 Modality: individual  Related Interventions Assist the parents in identifying areas that need strengthening in their "parental team," then work with them to strengthen these areas (or assign "Learning to Parent as a Team" in the Adult Psychotherapy Administrator, arts by Stephannie Li). Objective Report an increase in  resolving conflicts with her adult son by talking calmly and assertively rather than aggressively and defensively. Target Date: 2023-11-21 Frequency: Biweekly  Progress: 0 Modality: individual  Related Interventions Use role-playing, role reversal, modeling, and behavioral rehearsal to help the client develop assertive ways to resolve conflict with parents (recommend Your Perfect Right: Assertiveness and Equality in Your Life and Relationships by Carnella Guadalajara). 6. Complete the process of accepting husband's impairment and expected loss of memory. 7. Develop an awareness of how the avoidance of grieving has affected life and begin the healing process. 8. Develop social and recreational activities as a routine part of life. 9. Enhance ability to effectively cope with the full variety of life's worries and anxieties. 10. Formulate and implement a new life attitudinal pattern that allows for a more relaxed pattern of living. Objective Learn and implement calming skills as a lifestyle change and to manage pressure situations. Target Date: 2023-11-21 Frequency: Biweekly  Progress: 0 Modality: individual  Related Interventions Assign the client to implement calming techniques in his/her daily life in general and when facing trigger situations; process the results, reinforcing success and provide corrective feedback toward improvement. Objective Verbalize a commitment to learning new approaches managing self, time, and relationships that emphasize the values of inner and other orientation. Target Date: 2023-11-21 Frequency: Biweekly  Progress: 0 Modality: individual  Related Interventions Ask the client to commit to attempting attitude and behavior changes to promote a healthier, less Type A lifestyle; explore with him/her what changes need to be made to become less Type A. Objective Learn and implement respectful assertive communication knowledge and skills to replace insensitive directness or  verbal aggression that is controlling. Target Date: 2023-11-21 Frequency: Biweekly  Progress: 0 Modality: individual  Related Interventions Monitor, point out, and reframe the client's actions or verbalizations that reflect a self-centered or critical approach to others; practice alternatives using behavioral strategies such as modeling, role-playing, and/or role reversal. Train the client in assertive communication with emphasis on recognizing and refraining from aggressive communication (  e.g., ignoring of the rights of others) to respectful, assertive communication. Objective Learn problem-solving and/or conflict resolution skills to manage interpersonal problems. Target Date: 2023-11-21 Frequency: Biweekly  Progress: 0 Modality: individual  Related Interventions Teach the client conflict resolution skills (e.g., empathy, active listening, "I messages," respectful communication, assertiveness without aggression, compromise); use role- play and modeling to apply these skills to current conflicts. Teach the client problem-solving skills (e.g., define the problem specifically, brainstorm options, list the pros and cons of each option, chose and implement an option, evaluate the outcome); use modeling, role-playing, and behavior rehearsal to apply this skill to several current conflicts (or assign "Plan Before Acting" in the Adult Psychotherapy Homework Planner by Stephannie Li). Objective Demonstrate decreased impatience with others by talking of appreciating and understanding the good qualities in others. Target Date: 2023-11-21 Frequency: Biweekly  Progress: 0 Modality: individual  Related Interventions Assign the client to talk to an associate or child, focusing on listening to the other person and learning several good things about that person; process the experience. 11. Learn and implement coping skills that result in a reduction of anxiety and worry, and improved daily functioning. 12. Stabilize  anxiety level while increasing ability to function on a daily basis. Objective Verbalize an understanding of the cognitive, physiological, and behavioral components of anxiety and its treatment. Target Date: 2023-11-21 Frequency: Biweekly  Progress: 0 Modality: individual  Related Interventions Discuss how generalized anxiety typically involves excessive worry about unrealistic threats, various bodily expressions of tension, overarousal, and hypervigilance, and avoidance of what is threatening that interact to maintain the problem (see Mastery of Your Anxiety and Worry: Therapist Guide by Renard Matter, and Barlow; Treating Generalized Anxiety Disorder by Rygh and Ida Rogue). Discuss how treatment targets worry, anxiety symptoms, and avoidance to help the client manage worry effectively, reduce overarousal, and eliminate unnecessary avoidance. Objective Learn and implement calming skills to reduce overall anxiety and manage anxiety symptoms. Target Date: 2023-11-21 Frequency: Biweekly  Progress: 0 Modality: individual  Related Interventions Teach the client calming/relaxation skills (e.g., applied relaxation, progressive muscle relaxation, cue controlled relaxation; mindful breathing; biofeedback) and how to discriminate better between relaxation and tension; teach the client how to apply these skills to his/her daily life (e.g., New Directions in Progressive Muscle Relaxation by Marcelyn Ditty, and Hazlett-Stevens; Treating Generalized Anxiety Disorder by Rygh and Ida Rogue). Assign the client to read about progressive muscle relaxation and other calming strategies in relevant books or treatment manuals (e.g., Progressive Relaxation Training by Robb Matar and Alen Blew; Mastery of Your Anxiety and Worry: Workbook by Earlie Counts). Objective Verbalize an understanding of the role that cognitive biases play in excessive irrational worry and persistent anxiety symptoms. Target Date:  2023-11-21 Frequency: Biweekly  Progress: 0 Modality: individual  Related Interventions Discuss examples demonstrating that unrealistic worry typically overestimates the probability of threats and underestimates or overlooks the client's ability to manage realistic demands (or assign "Past Successful Anxiety Coping" in the Adult Psychotherapy Homework Planner by [facility]). Assist the client in analyzing his/her worries by examining potential biases such as the probability of the negative expectation occurring, the real consequences of it occurring, his/her ability to control the outcome, the worst possible outcome, and his/her ability to accept it (see "Analyze the Probability of a Feared Event" in the Adult Psychotherapy Homework Planner by Stephannie Li; Cognitive Therapy of Anxiety Disorders by Laurence Slate). Objective Learn to accept limitations in life and commit to tolerating, rather than avoiding, unpleasant emotions while accomplishing meaningful goals. Target Date: 2023-11-21 Frequency: Biweekly  Progress: 0 Modality: individual  Related Interventions Use techniques from Acceptance and Commitment Therapy to help client accept uncomfortable realities such as lack of complete control, imperfections, and uncertainty and tolerate unpleasant emotions and thoughts in order to accomplish value-consistent goals. Objective Reestablish a consistent sleep-wake cycle. Target Date: 2023-11-21 Frequency: Biweekly  Progress: 0 Modality: individual  Related Interventions Teach and implement sleep hygiene practices to help the client reestablish a consistent sleep-wake cycle; review, reinforce success, and provide corrective feedback toward improvement.  Diagnosis:Adjustment disorder with mixed anxiety and depressed mood  Plan:  -meet again on Thursday, January 30, 2023 at 12pm in-person

## 2023-01-30 ENCOUNTER — Ambulatory Visit: Payer: Medicare Other | Admitting: Professional

## 2023-02-06 ENCOUNTER — Encounter: Payer: Self-pay | Admitting: Hematology

## 2023-02-06 NOTE — Telephone Encounter (Signed)
Telephone call  

## 2023-02-14 ENCOUNTER — Ambulatory Visit: Payer: Medicare Other | Admitting: Professional

## 2023-02-24 ENCOUNTER — Encounter: Payer: Self-pay | Admitting: Medical-Surgical

## 2023-02-24 DIAGNOSIS — Z636 Dependent relative needing care at home: Secondary | ICD-10-CM

## 2023-02-24 MED ORDER — SERTRALINE HCL 50 MG PO TABS: 50.0000 mg | ORAL_TABLET | Freq: Every day | ORAL | 2 refills | Status: DC

## 2023-02-24 MED ORDER — SERTRALINE HCL 50 MG PO TABS: ORAL_TABLET | ORAL | 2 refills | Status: DC

## 2023-02-27 ENCOUNTER — Ambulatory Visit: Payer: Medicare Other | Admitting: Professional

## 2023-02-27 ENCOUNTER — Encounter: Payer: Self-pay | Admitting: Professional

## 2023-02-27 DIAGNOSIS — F4323 Adjustment disorder with mixed anxiety and depressed mood: Secondary | ICD-10-CM | POA: Diagnosis not present

## 2023-02-27 NOTE — Progress Notes (Signed)
[facility] Counselor/Therapist Progress Note  Patient ID: Mariah Kemp, MRN: [id number],    Date: 02/27/2023  Time Spent: 44 minutes 1201-1245pm   Treatment Type: Individual Therapy  Risk Assessment: Danger to Self:  No Self-injurious Behavior: No Danger to Others: No  Subjective: The patient arrived on time for her in-person appointment.  Issues addressed: 1-homework- completed -increase awareness of anger triggers and responses and change responses 2-spouse -has refused to take medication -has only agreed to take his blood pressure medication -pt unsure how to support -he is not willing to participate in doing anything outside the home 3-mood -pt admits it impacts her mood -she feels irritated by his unwillingness to fight -she is sad and admits that she is struggling to accept the prognosis -she feels uncertain of what her future holds   Treatment Plan Problems Addressed  Anxiety, Family Conflict, Grief / Loss Unresolved, Type A Behavior Goals 1. Achieve an overall decrease in pressured, driven behaviors. 2. Alleviate sense of time urgency, free-floating anxiety, anger, and self-destructive behaviors. 3. Become a reconstituted/blended family unit that is functional and whose members are bonded to each other. 4. Begin a healthy grieving process around husband's Alzheimer's diagnosis. Objective Attend an Alzheimer's support group. Target Date: 2023-11-21 Frequency: Biweekly  Progress: 0 Modality: individual  Related Interventions Ask the client to attend a grief/loss support group and report to the therapist how he/she felt about attending. Objective Verbalize resolution of feelings of guilt and regret associated with cognitively impaired spouse's condition. Target Date: 2023-11-21 Frequency: Biweekly  Progress: 0 Modality: individual  Related Interventions Assign the client to make a list of all the regrets associated with actions toward or  relationship with the deceased; process the list content toward resolution of these feelings. Objective Identify and voice positives about the cognitively impaired spouse including previous positive experiences, positive characteristics, positive aspects of the relationship, and how these things may be remembered. Target Date: 2023-11-21 Frequency: Biweekly  Progress: 0 Modality: individual  Related Interventions Ask the client to discuss and/or list the positive aspects of and memories about his/her relationship with the lost loved one; reinforce the client's expression of positive memories and emotions (e.g., smiling, laughing); encourage the client to share these thoughts with supportive loved ones. Objective Express thoughts and feelings to the cognitively impaired spouse while he is still capable of comprehending Target Date: 2023-11-21 Frequency: Biweekly  Progress: 0 Modality: individual  Related Interventions Ask the client to write a letter to the lost person describing his/her fond memories and/or painful and regretful memories, and how he/she currently feels life (or assign "Dear : A Letter to a Lost Loved One" in the Adult Psychotherapy Homework Planner by [facility]); process the letter in session. Objective Reengage in activities with family, friends, coworkers, and others. Target Date: 2023-11-21 Frequency: Biweekly  Progress: 0 Modality: individual  Related Interventions Assist the client in recommitting and reengaging in the primary social positive roles in which he/she has functioned prior to the loss. Promote behavioral activation by assisting the client in listing activities which he/she previously enjoyed but has not engaged in since experiencing the loss and then encourage reengagement in these activities (or assign "Identify and Schedule Pleasant Activities" in the Adult Psychotherapy Homework Planner by Stephannie Li). Objective Acknowledge dependency on husband and begin to  refocus life on independent actions to meet emotional needs. Target Date: 2023-11-21 Frequency: Biweekly  Progress: 0 Modality: individual  Related Interventions Explore the feelings of anger or guilt that surround the loss, helping  the client understand the sources for such feelings. Assist the client in identifying how he/she depended upon the significant other, expressing and resolving the accompanying feelings of abandonment and of being left alone. 5. Begin the process of reengaging a relationship with adult son. Objective Family members report a desire for and vision of a new sense of connectedness. Target Date: 2023-11-21 Frequency: Biweekly  Progress: 0 Modality: individual  Objective Describe the conflicts and the causes of conflicts between self and adult son. Target Date: 2023-11-21 Frequency: Biweekly  Progress: 0 Modality: individual  Related Interventions Give verbal permission for the client to have and express own feelings, thoughts, and perspectives in order to foster a sense of autonomy from family. Explore the nature of the client's family conflicts and their perceived causes. Objective Identify own as well as others' role in the family conflicts. Target Date: 2023-11-21 Frequency: Biweekly  Progress: 0 Modality: individual  Related Interventions Confront the client when he/she is not taking responsibility for his/her role in the family conflict and reinforce the client for owning responsibility for his/her contribution to the conflict. Objective Identify ways in which the patient's interactions with son could be tempered with assertiveness. Target Date: 2023-11-21 Frequency: Biweekly  Progress: 0 Modality: individual  Related Interventions Assist the parents in identifying areas that need strengthening in their "parental team," then work with them to strengthen these areas (or assign "Learning to Parent as a Team" in the Adult Psychotherapy Administrator, arts by  Stephannie Li). Objective Report an increase in resolving conflicts with her adult son by talking calmly and assertively rather than aggressively and defensively. Target Date: 2023-11-21 Frequency: Biweekly  Progress: 0 Modality: individual  Related Interventions Use role-playing, role reversal, modeling, and behavioral rehearsal to help the client develop assertive ways to resolve conflict with parents (recommend Your Perfect Right: Assertiveness and Equality in Your Life and Relationships by Carnella Guadalajara). 6. Complete the process of accepting husband's impairment and expected loss of memory. 7. Develop an awareness of how the avoidance of grieving has affected life and begin the healing process. 8. Develop social and recreational activities as a routine part of life. 9. Enhance ability to effectively cope with the full variety of life's worries and anxieties. 10. Formulate and implement a new life attitudinal pattern that allows for a more relaxed pattern of living. Objective Learn and implement calming skills as a lifestyle change and to manage pressure situations. Target Date: 2023-11-21 Frequency: Biweekly  Progress: 0 Modality: individual  Related Interventions Assign the client to implement calming techniques in his/her daily life in general and when facing trigger situations; process the results, reinforcing success and provide corrective feedback toward improvement. Objective Verbalize a commitment to learning new approaches managing self, time, and relationships that emphasize the values of inner and other orientation. Target Date: 2023-11-21 Frequency: Biweekly  Progress: 0 Modality: individual  Related Interventions Ask the client to commit to attempting attitude and behavior changes to promote a healthier, less Type A lifestyle; explore with him/her what changes need to be made to become less Type A. Objective Learn and implement respectful assertive communication knowledge and  skills to replace insensitive directness or verbal aggression that is controlling. Target Date: 2023-11-21 Frequency: Biweekly  Progress: 0 Modality: individual  Related Interventions Monitor, point out, and reframe the client's actions or verbalizations that reflect a self-centered or critical approach to others; practice alternatives using behavioral strategies such as modeling, role-playing, and/or role reversal. Train the client in assertive communication with emphasis on recognizing  and refraining from aggressive communication (e.g., ignoring of the rights of others) to respectful, assertive communication. Objective Learn problem-solving and/or conflict resolution skills to manage interpersonal problems. Target Date: 2023-11-21 Frequency: Biweekly  Progress: 0 Modality: individual  Related Interventions Teach the client conflict resolution skills (e.g., empathy, active listening, "I messages," respectful communication, assertiveness without aggression, compromise); use role- play and modeling to apply these skills to current conflicts. Teach the client problem-solving skills (e.g., define the problem specifically, brainstorm options, list the pros and cons of each option, chose and implement an option, evaluate the outcome); use modeling, role-playing, and behavior rehearsal to apply this skill to several current conflicts (or assign "Plan Before Acting" in the Adult Psychotherapy Homework Planner by Stephannie Li). Objective Demonstrate decreased impatience with others by talking of appreciating and understanding the good qualities in others. Target Date: 2023-11-21 Frequency: Biweekly  Progress: 0 Modality: individual  Related Interventions Assign the client to talk to an associate or child, focusing on listening to the other person and learning several good things about that person; process the experience. 11. Learn and implement coping skills that result in a reduction of anxiety and worry, and  improved daily functioning. 12. Stabilize anxiety level while increasing ability to function on a daily basis. Objective Verbalize an understanding of the cognitive, physiological, and behavioral components of anxiety and its treatment. Target Date: 2023-11-21 Frequency: Biweekly  Progress: 0 Modality: individual  Related Interventions Discuss how generalized anxiety typically involves excessive worry about unrealistic threats, various bodily expressions of tension, overarousal, and hypervigilance, and avoidance of what is threatening that interact to maintain the problem (see Mastery of Your Anxiety and Worry: Therapist Guide by Renard Matter, and Barlow; Treating Generalized Anxiety Disorder by Rygh and Ida Rogue). Discuss how treatment targets worry, anxiety symptoms, and avoidance to help the client manage worry effectively, reduce overarousal, and eliminate unnecessary avoidance. Objective Learn and implement calming skills to reduce overall anxiety and manage anxiety symptoms. Target Date: 2023-11-21 Frequency: Biweekly  Progress: 0 Modality: individual  Related Interventions Teach the client calming/relaxation skills (e.g., applied relaxation, progressive muscle relaxation, cue controlled relaxation; mindful breathing; biofeedback) and how to discriminate better between relaxation and tension; teach the client how to apply these skills to his/her daily life (e.g., New Directions in Progressive Muscle Relaxation by Marcelyn Ditty, and Hazlett-Stevens; Treating Generalized Anxiety Disorder by Rygh and Ida Rogue). Assign the client to read about progressive muscle relaxation and other calming strategies in relevant books or treatment manuals (e.g., Progressive Relaxation Training by Robb Matar and Alen Blew; Mastery of Your Anxiety and Worry: Workbook by Earlie Counts). Objective Verbalize an understanding of the role that cognitive biases play in excessive irrational worry and  persistent anxiety symptoms. Target Date: 2023-11-21 Frequency: Biweekly  Progress: 0 Modality: individual  Related Interventions Discuss examples demonstrating that unrealistic worry typically overestimates the probability of threats and underestimates or overlooks the client's ability to manage realistic demands (or assign "Past Successful Anxiety Coping" in the Adult Psychotherapy Homework Planner by [facility]). Assist the client in analyzing his/her worries by examining potential biases such as the probability of the negative expectation occurring, the real consequences of it occurring, his/her ability to control the outcome, the worst possible outcome, and his/her ability to accept it (see "Analyze the Probability of a Feared Event" in the Adult Psychotherapy Homework Planner by Stephannie Li; Cognitive Therapy of Anxiety Disorders by Laurence Slate). Objective Learn to accept limitations in life and commit to tolerating, rather than avoiding, unpleasant emotions while accomplishing meaningful goals.  Target Date: 2023-11-21 Frequency: Biweekly  Progress: 0 Modality: individual  Related Interventions Use techniques from Acceptance and Commitment Therapy to help client accept uncomfortable realities such as lack of complete control, imperfections, and uncertainty and tolerate unpleasant emotions and thoughts in order to accomplish value-consistent goals. Objective Reestablish a consistent sleep-wake cycle. Target Date: 2023-11-21 Frequency: Biweekly  Progress: 0 Modality: individual  Related Interventions Teach and implement sleep hygiene practices to help the client reestablish a consistent sleep-wake cycle; review, reinforce success, and provide corrective feedback toward improvement.  Diagnosis:Adjustment disorder with mixed anxiety and depressed mood  Plan:  -meet again on Friday, March 21, 2023 at 8am in-person

## 2023-03-15 ENCOUNTER — Other Ambulatory Visit: Payer: Self-pay | Admitting: Medical-Surgical

## 2023-03-21 ENCOUNTER — Ambulatory Visit (INDEPENDENT_AMBULATORY_CARE_PROVIDER_SITE_OTHER): Payer: Medicare Other | Admitting: Professional

## 2023-03-21 ENCOUNTER — Encounter: Payer: Self-pay | Admitting: Professional

## 2023-03-21 DIAGNOSIS — F4323 Adjustment disorder with mixed anxiety and depressed mood: Secondary | ICD-10-CM

## 2023-03-21 NOTE — Progress Notes (Signed)
 [facility] Counselor/Therapist Progress Note  Patient ID: Mariah Kemp, MRN: [id number],    Date: 03/21/2023  Time Spent: 35 minutes 810-845am   Treatment Type: Individual Therapy  Risk Assessment: Danger to Self:  No Self-injurious Behavior: No Danger to Others: No  Subjective: The patient arrived on time for her in-person appointment.  Issues addressed: 1-spouse -had an appt with MD -he has agreed to take medication for physical health, but continues to refuse MH meds -visit was helpful for pt given that the MD reinforced what this Clinician has been encouraging   -pt is to step away and not get tied up in debating with spouse   -he is unable to be reasoned with 2-mood -pt is noticeably brighter and admits she has been feeling good -she was a little sad on Christmas given that it was just her and her spouse -pt's daughter, SIL, and grandson have been present for every Christmas for the past 19 years 3-coping -pt reports she is improving her coping skills -she has walked away from her spouse when he is escalating -she no longer engages the battle   Treatment Plan Problems Addressed  Anxiety, Family Conflict, Grief / Loss Unresolved, Type A Behavior Goals 1. Achieve an overall decrease in pressured, driven behaviors. 2. Alleviate sense of time urgency, free-floating anxiety, anger, and self-destructive behaviors. 3. Become a reconstituted/blended family unit that is functional and whose members are bonded to each other. 4. Begin a healthy grieving process around husband's Alzheimer's diagnosis. Objective Attend an Alzheimer's support group. Target Date: 2023-11-21 Frequency: Biweekly  Progress: 0 Modality: individual  Related Interventions Ask the client to attend a grief/loss support group and report to the therapist how he/she felt about attending. Objective Verbalize resolution of feelings of guilt and regret associated with cognitively impaired  spouse's condition. Target Date: 2023-11-21 Frequency: Biweekly  Progress: 0 Modality: individual  Related Interventions Assign the client to make a list of all the regrets associated with actions toward or relationship with the deceased; process the list content toward resolution of these feelings. Objective Identify and voice positives about the cognitively impaired spouse including previous positive experiences, positive characteristics, positive aspects of the relationship, and how these things may be remembered. Target Date: 2023-11-21 Frequency: Biweekly  Progress: 0 Modality: individual  Related Interventions Ask the client to discuss and/or list the positive aspects of and memories about his/her relationship with the lost loved one; reinforce the client's expression of positive memories and emotions (e.g., smiling, laughing); encourage the client to share these thoughts with supportive loved ones. Objective Express thoughts and feelings to the cognitively impaired spouse while he is still capable of comprehending Target Date: 2023-11-21 Frequency: Biweekly  Progress: 0 Modality: individual  Related Interventions Ask the client to write a letter to the lost person describing his/her fond memories and/or painful and regretful memories, and how he/she currently feels life (or assign Dear : A Letter to a Lost Loved One in the Adult Psychotherapy Homework Planner by Jenniffer); process the letter in session. Objective Reengage in activities with family, friends, coworkers, and others. Target Date: 2023-11-21 Frequency: Biweekly  Progress: 0 Modality: individual  Related Interventions Assist the client in recommitting and reengaging in the primary social positive roles in which he/she has functioned prior to the loss. Promote behavioral activation by assisting the client in listing activities which he/she previously enjoyed but has not engaged in since experiencing the loss and then  encourage reengagement in these activities (or assign Identify and Schedule  Pleasant Activities in the Adult Psychotherapy Homework Planner by Jenniffer). Objective Acknowledge dependency on husband and begin to refocus life on independent actions to meet emotional needs. Target Date: 2023-11-21 Frequency: Biweekly  Progress: 0 Modality: individual  Related Interventions Explore the feelings of anger or guilt that surround the loss, helping the client understand the sources for such feelings. Assist the client in identifying how he/she depended upon the significant other, expressing and resolving the accompanying feelings of abandonment and of being left alone. 5. Begin the process of reengaging a relationship with adult son. Objective Family members report a desire for and vision of a new sense of connectedness. Target Date: 2023-11-21 Frequency: Biweekly  Progress: 0 Modality: individual  Objective Describe the conflicts and the causes of conflicts between self and adult son. Target Date: 2023-11-21 Frequency: Biweekly  Progress: 0 Modality: individual  Related Interventions Give verbal permission for the client to have and express own feelings, thoughts, and perspectives in order to foster a sense of autonomy from family. Explore the nature of the client's family conflicts and their perceived causes. Objective Identify own as well as others' role in the family conflicts. Target Date: 2023-11-21 Frequency: Biweekly  Progress: 0 Modality: individual  Related Interventions Confront the client when he/she is not taking responsibility for his/her role in the family conflict and reinforce the client for owning responsibility for his/her contribution to the conflict. Objective Identify ways in which the patient's interactions with son could be tempered with assertiveness. Target Date: 2023-11-21 Frequency: Biweekly  Progress: 0 Modality: individual  Related Interventions Assist the parents  in identifying areas that need strengthening in their parental team, then work with them to strengthen these areas (or assign Learning to Parent as a Team in the Adult Games Developer by [employer]). Objective Report an increase in resolving conflicts with her adult son by talking calmly and assertively rather than aggressively and defensively. Target Date: 2023-11-21 Frequency: Biweekly  Progress: 0 Modality: individual  Related Interventions Use role-playing, role reversal, modeling, and behavioral rehearsal to help the client develop assertive ways to resolve conflict with parents (recommend Your Perfect Right: Assertiveness and Equality in Your Life and Relationships by Darrelyn armin Sick). 6. Complete the process of accepting husband's impairment and expected loss of memory. 7. Develop an awareness of how the avoidance of grieving has affected life and begin the healing process. 8. Develop social and recreational activities as a routine part of life. 9. Enhance ability to effectively cope with the full variety of life's worries and anxieties. 10. Formulate and implement a new life attitudinal pattern that allows for a more relaxed pattern of living. Objective Learn and implement calming skills as a lifestyle change and to manage pressure situations. Target Date: 2023-11-21 Frequency: Biweekly  Progress: 0 Modality: individual  Related Interventions Assign the client to implement calming techniques in his/her daily life in general and when facing trigger situations; process the results, reinforcing success and provide corrective feedback toward improvement. Objective Verbalize a commitment to learning new approaches managing self, time, and relationships that emphasize the values of inner and other orientation. Target Date: 2023-11-21 Frequency: Biweekly  Progress: 0 Modality: individual  Related Interventions Ask the client to commit to attempting attitude and behavior  changes to promote a healthier, less Type A lifestyle; explore with him/her what changes need to be made to become less Type A. Objective Learn and implement respectful assertive communication knowledge and skills to replace insensitive directness or verbal aggression that is controlling. Target Date:  2023-11-21 Frequency: Biweekly  Progress: 0 Modality: individual  Related Interventions Monitor, point out, and reframe the client's actions or verbalizations that reflect a self-centered or critical approach to others; practice alternatives using behavioral strategies such as modeling, role-playing, and/or role reversal. Train the client in assertive communication with emphasis on recognizing and refraining from aggressive communication (e.g., ignoring of the rights of others) to respectful, assertive communication. Objective Learn problem-solving and/or conflict resolution skills to manage interpersonal problems. Target Date: 2023-11-21 Frequency: Biweekly  Progress: 0 Modality: individual  Related Interventions Teach the client conflict resolution skills (e.g., empathy, active listening, I messages, respectful communication, assertiveness without aggression, compromise); use role- play and modeling to apply these skills to current conflicts. Teach the client problem-solving skills (e.g., define the problem specifically, brainstorm options, list the pros and cons of each option, chose and implement an option, evaluate the outcome); use modeling, role-playing, and behavior rehearsal to apply this skill to several current conflicts (or assign Plan Before Acting in the Adult Psychotherapy Homework Planner by Jenniffer). Objective Demonstrate decreased impatience with others by talking of appreciating and understanding the good qualities in others. Target Date: 2023-11-21 Frequency: Biweekly  Progress: 0 Modality: individual  Related Interventions Assign the client to talk to an associate or child,  focusing on listening to the other person and learning several good things about that person; process the experience. 11. Learn and implement coping skills that result in a reduction of anxiety and worry, and improved daily functioning. 12. Stabilize anxiety level while increasing ability to function on a daily basis. Objective Verbalize an understanding of the cognitive, physiological, and behavioral components of anxiety and its treatment. Target Date: 2023-11-21 Frequency: Biweekly  Progress: 0 Modality: individual  Related Interventions Discuss how generalized anxiety typically involves excessive worry about unrealistic threats, various bodily expressions of tension, overarousal, and hypervigilance, and avoidance of what is threatening that interact to maintain the problem (see Mastery of Your Anxiety and Worry: Therapist Guide by Venson River, and Barlow; Treating Generalized Anxiety Disorder by Rygh and Red). Discuss how treatment targets worry, anxiety symptoms, and avoidance to help the client manage worry effectively, reduce overarousal, and eliminate unnecessary avoidance. Objective Learn and implement calming skills to reduce overall anxiety and manage anxiety symptoms. Target Date: 2023-11-21 Frequency: Biweekly  Progress: 0 Modality: individual  Related Interventions Teach the client calming/relaxation skills (e.g., applied relaxation, progressive muscle relaxation, cue controlled relaxation; mindful breathing; biofeedback) and how to discriminate better between relaxation and tension; teach the client how to apply these skills to his/her daily life (e.g., New Directions in Progressive Muscle Relaxation by Thornell Collier, and Hazlett-Stevens; Treating Generalized Anxiety Disorder by Rygh and Red). Assign the client to read about progressive muscle relaxation and other calming strategies in relevant books or treatment manuals (e.g., Progressive Relaxation Training by  Thornell and Collier; Mastery of Your Anxiety and Worry: Workbook by River armin Given). Objective Verbalize an understanding of the role that cognitive biases play in excessive irrational worry and persistent anxiety symptoms. Target Date: 2023-11-21 Frequency: Biweekly  Progress: 0 Modality: individual  Related Interventions Discuss examples demonstrating that unrealistic worry typically overestimates the probability of threats and underestimates or overlooks the client's ability to manage realistic demands (or assign Past Successful Anxiety Coping in the Adult Psychotherapy Homework Planner by Jenniffer). Assist the client in analyzing his/her worries by examining potential biases such as the probability of the negative expectation occurring, the real consequences of it occurring, his/her ability to control the outcome, the worst possible  outcome, and his/her ability to accept it (see Analyze the Probability of a Feared Event in the Adult Psychotherapy Homework Planner by Jenniffer; Cognitive Therapy of Anxiety Disorders by Gretta armin Mon). Objective Learn to accept limitations in life and commit to tolerating, rather than avoiding, unpleasant emotions while accomplishing meaningful goals. Target Date: 2023-11-21 Frequency: Biweekly  Progress: 0 Modality: individual  Related Interventions Use techniques from Acceptance and Commitment Therapy to help client accept uncomfortable realities such as lack of complete control, imperfections, and uncertainty and tolerate unpleasant emotions and thoughts in order to accomplish value-consistent goals. Objective Reestablish a consistent sleep-wake cycle. Target Date: 2023-11-21 Frequency: Biweekly  Progress: 0 Modality: individual  Related Interventions Teach and implement sleep hygiene practices to help the client reestablish a consistent sleep-wake cycle; review, reinforce success, and provide corrective feedback toward  improvement.  Diagnosis:Adjustment disorder with mixed anxiety and depressed mood  Plan:  -meet again on Thursday, April 10, 2023 at 8am in-person

## 2023-03-26 ENCOUNTER — Other Ambulatory Visit: Payer: Medicare Other

## 2023-03-26 ENCOUNTER — Other Ambulatory Visit: Payer: Self-pay

## 2023-03-26 ENCOUNTER — Encounter: Payer: Self-pay | Admitting: Internal Medicine

## 2023-03-26 DIAGNOSIS — E039 Hypothyroidism, unspecified: Secondary | ICD-10-CM

## 2023-03-27 LAB — TSH: TSH: 0.01 m[IU]/L — ABNORMAL LOW (ref 0.40–4.50)

## 2023-03-27 LAB — T4, FREE: Free T4: 1.7 ng/dL (ref 0.8–1.8)

## 2023-04-10 ENCOUNTER — Ambulatory Visit: Payer: Medicare Other | Admitting: Professional

## 2023-04-10 ENCOUNTER — Encounter: Payer: Self-pay | Admitting: Professional

## 2023-04-10 DIAGNOSIS — F4323 Adjustment disorder with mixed anxiety and depressed mood: Secondary | ICD-10-CM | POA: Diagnosis not present

## 2023-04-10 NOTE — Progress Notes (Unsigned)
[facility] Counselor/Therapist Progress Note  Patient ID: Mariah Kemp, MRN: [id number],    Date: 04/10/2023  Time Spent: 40 minutes 813-853am   Treatment Type: Individual Therapy  Risk Assessment: Danger to Self:  No Self-injurious Behavior: No Danger to Others: No  Subjective: The patient arrived on time for her in-person appointment.  Issues addressed: 1-spouse -had an appt with MD -he has agreed to take medication for physical health, but continues to refuse MH meds -visit was helpful for pt given that the MD reinforced what this Clinician has been encouraging   -pt is to step away and not get tied up in debating with spouse   -he is unable to be reasoned with 2-mood -pt is noticeably brighter and admits she has been feeling good -she was a little sad on Christmas given that it was just her and her spouse -pt's daughter, SIL, and grandson have been present for every Christmas for the past 19 years 3-coping -pt reports she is improving her coping skills -she has walked away from her spouse when he is escalating -she no longer engages the battle   Treatment Plan Problems Addressed  Anxiety, Family Conflict, Grief / Loss Unresolved, Type A Behavior Goals 1. Achieve an overall decrease in pressured, driven behaviors. 2. Alleviate sense of time urgency, free-floating anxiety, anger, and self-destructive behaviors. 3. Become a reconstituted/blended family unit that is functional and whose members are bonded to each other. 4. Begin a healthy grieving process around husband's Alzheimer's diagnosis. Objective Attend an Alzheimer's support group. Target Date: 2023-11-21 Frequency: Biweekly  Progress: 0 Modality: individual  Related Interventions Ask the client to attend a grief/loss support group and report to the therapist how he/she felt about attending. Objective Verbalize resolution of feelings of guilt and regret associated with cognitively impaired  spouse's condition. Target Date: 2023-11-21 Frequency: Biweekly  Progress: 0 Modality: individual  Related Interventions Assign the client to make a list of all the regrets associated with actions toward or relationship with the deceased; process the list content toward resolution of these feelings. Objective Identify and voice positives about the cognitively impaired spouse including previous positive experiences, positive characteristics, positive aspects of the relationship, and how these things may be remembered. Target Date: 2023-11-21 Frequency: Biweekly  Progress: 0 Modality: individual  Related Interventions Ask the client to discuss and/or list the positive aspects of and memories about his/her relationship with the lost loved one; reinforce the client's expression of positive memories and emotions (e.g., smiling, laughing); encourage the client to share these thoughts with supportive loved ones. Objective Express thoughts and feelings to the cognitively impaired spouse while he is still capable of comprehending Target Date: 2023-11-21 Frequency: Biweekly  Progress: 0 Modality: individual  Related Interventions Ask the client to write a letter to the lost person describing his/her fond memories and/or painful and regretful memories, and how he/she currently feels life (or assign "Dear : A Letter to a Lost Loved One" in the Adult Psychotherapy Homework Planner by [facility]); process the letter in session. Objective Reengage in activities with family, friends, coworkers, and others. Target Date: 2023-11-21 Frequency: Biweekly  Progress: 0 Modality: individual  Related Interventions Assist the client in recommitting and reengaging in the primary social positive roles in which he/she has functioned prior to the loss. Promote behavioral activation by assisting the client in listing activities which he/she previously enjoyed but has not engaged in since experiencing the loss and then  encourage reengagement in these activities (or assign "Identify and Schedule  Pleasant Activities" in the Adult Psychotherapy Homework Planner by Stephannie Li). Objective Acknowledge dependency on husband and begin to refocus life on independent actions to meet emotional needs. Target Date: 2023-11-21 Frequency: Biweekly  Progress: 0 Modality: individual  Related Interventions Explore the feelings of anger or guilt that surround the loss, helping the client understand the sources for such feelings. Assist the client in identifying how he/she depended upon the significant other, expressing and resolving the accompanying feelings of abandonment and of being left alone. 5. Begin the process of reengaging a relationship with adult son. Objective Family members report a desire for and vision of a new sense of connectedness. Target Date: 2023-11-21 Frequency: Biweekly  Progress: 0 Modality: individual  Objective Describe the conflicts and the causes of conflicts between self and adult son. Target Date: 2023-11-21 Frequency: Biweekly  Progress: 0 Modality: individual  Related Interventions Give verbal permission for the client to have and express own feelings, thoughts, and perspectives in order to foster a sense of autonomy from family. Explore the nature of the client's family conflicts and their perceived causes. Objective Identify own as well as others' role in the family conflicts. Target Date: 2023-11-21 Frequency: Biweekly  Progress: 0 Modality: individual  Related Interventions Confront the client when he/she is not taking responsibility for his/her role in the family conflict and reinforce the client for owning responsibility for his/her contribution to the conflict. Objective Identify ways in which the patient's interactions with son could be tempered with assertiveness. Target Date: 2023-11-21 Frequency: Biweekly  Progress: 0 Modality: individual  Related Interventions Assist the parents  in identifying areas that need strengthening in their "parental team," then work with them to strengthen these areas (or assign "Learning to Parent as a Team" in the Adult Psychotherapy Administrator, arts by Stephannie Li). Objective Report an increase in resolving conflicts with her adult son by talking calmly and assertively rather than aggressively and defensively. Target Date: 2023-11-21 Frequency: Biweekly  Progress: 0 Modality: individual  Related Interventions Use role-playing, role reversal, modeling, and behavioral rehearsal to help the client develop assertive ways to resolve conflict with parents (recommend Your Perfect Right: Assertiveness and Equality in Your Life and Relationships by Carnella Guadalajara). 6. Complete the process of accepting husband's impairment and expected loss of memory. 7. Develop an awareness of how the avoidance of grieving has affected life and begin the healing process. 8. Develop social and recreational activities as a routine part of life. 9. Enhance ability to effectively cope with the full variety of life's worries and anxieties. 10. Formulate and implement a new life attitudinal pattern that allows for a more relaxed pattern of living. Objective Learn and implement calming skills as a lifestyle change and to manage pressure situations. Target Date: 2023-11-21 Frequency: Biweekly  Progress: 0 Modality: individual  Related Interventions Assign the client to implement calming techniques in his/her daily life in general and when facing trigger situations; process the results, reinforcing success and provide corrective feedback toward improvement. Objective Verbalize a commitment to learning new approaches managing self, time, and relationships that emphasize the values of inner and other orientation. Target Date: 2023-11-21 Frequency: Biweekly  Progress: 0 Modality: individual  Related Interventions Ask the client to commit to attempting attitude and behavior  changes to promote a healthier, less Type A lifestyle; explore with him/her what changes need to be made to become less Type A. Objective Learn and implement respectful assertive communication knowledge and skills to replace insensitive directness or verbal aggression that is controlling. Target Date:  2023-11-21 Frequency: Biweekly  Progress: 0 Modality: individual  Related Interventions Monitor, point out, and reframe the client's actions or verbalizations that reflect a self-centered or critical approach to others; practice alternatives using behavioral strategies such as modeling, role-playing, and/or role reversal. Train the client in assertive communication with emphasis on recognizing and refraining from aggressive communication (e.g., ignoring of the rights of others) to respectful, assertive communication. Objective Learn problem-solving and/or conflict resolution skills to manage interpersonal problems. Target Date: 2023-11-21 Frequency: Biweekly  Progress: 0 Modality: individual  Related Interventions Teach the client conflict resolution skills (e.g., empathy, active listening, "I messages," respectful communication, assertiveness without aggression, compromise); use role- play and modeling to apply these skills to current conflicts. Teach the client problem-solving skills (e.g., define the problem specifically, brainstorm options, list the pros and cons of each option, chose and implement an option, evaluate the outcome); use modeling, role-playing, and behavior rehearsal to apply this skill to several current conflicts (or assign "Plan Before Acting" in the Adult Psychotherapy Homework Planner by Stephannie Li). Objective Demonstrate decreased impatience with others by talking of appreciating and understanding the good qualities in others. Target Date: 2023-11-21 Frequency: Biweekly  Progress: 0 Modality: individual  Related Interventions Assign the client to talk to an associate or child,  focusing on listening to the other person and learning several good things about that person; process the experience. 11. Learn and implement coping skills that result in a reduction of anxiety and worry, and improved daily functioning. 12. Stabilize anxiety level while increasing ability to function on a daily basis. Objective Verbalize an understanding of the cognitive, physiological, and behavioral components of anxiety and its treatment. Target Date: 2023-11-21 Frequency: Biweekly  Progress: 0 Modality: individual  Related Interventions Discuss how generalized anxiety typically involves excessive worry about unrealistic threats, various bodily expressions of tension, overarousal, and hypervigilance, and avoidance of what is threatening that interact to maintain the problem (see Mastery of Your Anxiety and Worry: Therapist Guide by Renard Matter, and Barlow; Treating Generalized Anxiety Disorder by Rygh and Ida Rogue). Discuss how treatment targets worry, anxiety symptoms, and avoidance to help the client manage worry effectively, reduce overarousal, and eliminate unnecessary avoidance. Objective Learn and implement calming skills to reduce overall anxiety and manage anxiety symptoms. Target Date: 2023-11-21 Frequency: Biweekly  Progress: 0 Modality: individual  Related Interventions Teach the client calming/relaxation skills (e.g., applied relaxation, progressive muscle relaxation, cue controlled relaxation; mindful breathing; biofeedback) and how to discriminate better between relaxation and tension; teach the client how to apply these skills to his/her daily life (e.g., New Directions in Progressive Muscle Relaxation by Marcelyn Ditty, and Hazlett-Stevens; Treating Generalized Anxiety Disorder by Rygh and Ida Rogue). Assign the client to read about progressive muscle relaxation and other calming strategies in relevant books or treatment manuals (e.g., Progressive Relaxation Training by  Robb Matar and Alen Blew; Mastery of Your Anxiety and Worry: Workbook by Earlie Counts). Objective Verbalize an understanding of the role that cognitive biases play in excessive irrational worry and persistent anxiety symptoms. Target Date: 2023-11-21 Frequency: Biweekly  Progress: 0 Modality: individual  Related Interventions Discuss examples demonstrating that unrealistic worry typically overestimates the probability of threats and underestimates or overlooks the client's ability to manage realistic demands (or assign "Past Successful Anxiety Coping" in the Adult Psychotherapy Homework Planner by [facility]). Assist the client in analyzing his/her worries by examining potential biases such as the probability of the negative expectation occurring, the real consequences of it occurring, his/her ability to control the outcome, the worst possible  outcome, and his/her ability to accept it (see "Analyze the Probability of a Feared Event" in the Adult Psychotherapy Homework Planner by Stephannie Li; Cognitive Therapy of Anxiety Disorders by Laurence Slate). Objective Learn to accept limitations in life and commit to tolerating, rather than avoiding, unpleasant emotions while accomplishing meaningful goals. Target Date: 2023-11-21 Frequency: Biweekly  Progress: 0 Modality: individual  Related Interventions Use techniques from Acceptance and Commitment Therapy to help client accept uncomfortable realities such as lack of complete control, imperfections, and uncertainty and tolerate unpleasant emotions and thoughts in order to accomplish value-consistent goals. Objective Reestablish a consistent sleep-wake cycle. Target Date: 2023-11-21 Frequency: Biweekly  Progress: 0 Modality: individual  Related Interventions Teach and implement sleep hygiene practices to help the client reestablish a consistent sleep-wake cycle; review, reinforce success, and provide corrective feedback toward improvement.  Diagnosis:No  diagnosis found.  Plan:  -meet again on Thursday, April 10, 2023 at 8am in-person

## 2023-04-22 NOTE — Progress Notes (Signed)
 Name: Mariah Kemp  MRN/ DOB: [id number], March 07, 1961    Age/ Sex: 63 y.o., female     PCP: Willo Mini, NP   Reason for Endocrinology Evaluation: Hypothyroidism     Initial Endocrinology Clinic Visit: 07/19/2020    PATIENT IDENTIFIER: [name] is a 63 y.o., female with a past medical history of Hx of breast Ca ( S/P right mastectomy ) , Hx of gastric cancer HTn and hypothyroidism. She has followed with [facility] since 07/19/2020 for consultative assistance with management of her Hypothyroidism.   HISTORICAL SUMMARY:  She has been diagnosed with gastric cancer in 2020. S/P chemo from 1-07/2018 followed by total gastrectomy 09/2018.  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      She has been diagnosed with Hypothyroidism ~ 30 yrs ago . She has been on LT-4 for years.    Started liothyronine  11/2021 due to variable absorption of levothyroxine   No FH of thyroid  disease  SUBJECTIVE:    Today (04/23/2023):  Mariah Kemp is here for hypothyroidism.   She was seen by Dr. Lanny (oncology) for follow-up on gastric carcinoma She feel sluggish and down  Weight continues to trend down  Denies constipation or diarrhea  Denies local neck symptoms  Denies palpitations  Denies tremors    She is on  Biotin, on hold  She is on MVI   Levothyroxine  175 mcg , 6 days a week Liothyronine  5 mcg daily    HISTORY:  Past Medical History:  Past Medical History:  Diagnosis Date   Anemia    Asthma    Blood transfusion without reported diagnosis    Breast cancer (HCC)    Dyspnea    over exertion   Hypertension    Hypothyroidism    Stomach cancer [facility])    Past Surgical History:  Past Surgical History:  Procedure Laterality Date   ABDOMINAL HYSTERECTOMY  1996   BARIATRIC SURGERY     stomach removal   BILATERAL SALPINGOOPHORECTOMY  2007   BIOPSY  03/29/2018   Procedure: BIOPSY;  Surgeon: Dianna Specking, MD;  Location: [facility] ENDOSCOPY;  Service: Endoscopy;;    CARPAL TUNNEL RELEASE     COLONOSCOPY N/A 03/29/2018   Procedure: COLONOSCOPY;  Surgeon: Dianna Specking, MD;  Location[facility] ENDOSCOPY;  Service: Endoscopy;  Laterality: N/A;   COLONOSCOPY     ESOPHAGOGASTRODUODENOSCOPY N/A 03/29/2018   Procedure: ESOPHAGOGASTRODUODENOSCOPY (EGD);  Surgeon: Dianna Specking, MD;  Location: [facility] ENDOSCOPY;  Service: Endoscopy;  Laterality: N/A;   GASTRECTOMY N/A 10/02/2018   Procedure: TOTAL GASTRECTOMY;  Surgeon: Aron Shoulders, MD;  Location: [facility] OR;  Service: General;  Laterality: N/A;   GASTROSTOMY N/A 10/02/2018   Procedure: FEEDING TUBE PLACEMENT;  Surgeon: Aron Shoulders, MD;  Location: [facility] OR;  Service: General;  Laterality: N/A;   LAPAROSCOPY N/A 04/13/2018   Procedure: LAPAROSCOPY DIAGNOSTIC;  Surgeon: Aron Shoulders, MD;  Location: [facility] ORS;  Service: General;  Laterality: N/A;   LAPAROSCOPY N/A 10/02/2018   Procedure: LAPAROSCOPY DIAGNOSTIC;  Surgeon: Aron Shoulders, MD;  Location: [facility] OR;  Service: General;  Laterality: N/A;   MASTECTOMY Right 2007   PORT-A-CATH REMOVAL Left 11/07/2020   Procedure: PORT REMOVAL;  Surgeon: Aron Shoulders, MD;  Location: [facility] OR;  Service: General;  Laterality: Left;   PORTACATH PLACEMENT Left 04/13/2018   Procedure: INSERTION PORT-A-CATH ERAS PATHWAY;  Surgeon: Aron Shoulders, MD;  Location: [facility] ORS;  Service: General;  Laterality: Left;  subclavian   UPPER GASTROINTESTINAL ENDOSCOPY     Social History:  reports that she quit smoking about 29 years ago. Her smoking use included cigarettes. She started smoking about 44 years ago. She has a 7.5 pack-year smoking history. She has never used smokeless tobacco. She reports that she does not drink alcohol and does not use drugs. Family History:  Family History  Problem Relation Age of Onset   Liver cancer Father    Cancer Paternal Uncle        unknown type cancer   Cirrhosis Mother    Colon cancer Neg Hx    Esophageal cancer Neg Hx    Rectal cancer Neg Hx    Stomach cancer  Neg Hx      HOME MEDICATIONS: Allergies as of 04/23/2023   No Known Allergies      Medication List        Accurate as of April 23, 2023  9:09 AM. If you have any questions, ask your nurse or doctor.          acyclovir  400 MG tablet Commonly known as: Zovirax  Take 1 tablet (400 mg total) by mouth 3 (three) times daily as needed. For five days. As needed for cold sore.   albuterol  108 (90 Base) MCG/ACT inhaler Commonly known as: VENTOLIN  HFA inhale 1-2 puffs into the lungs every 4 hours if needed for wheezing or shortness of breath.   atorvastatin  10 MG tablet Commonly known as: LIPITOR Take 1 tablet (10 mg total) by mouth daily.   B-D 3CC LUER-LOK SYR 25GX1 25G X 1 3 ML Misc Generic drug: SYRINGE-NEEDLE (DISP) 3 ML USE AS DIRECTED ONCE A WEEK   calcium  carbonate 500 MG chewable tablet Commonly known as: TUMS - dosed in mg elemental calcium  Chew 1 tablet (200 mg of elemental calcium  total) by mouth 3 (three) times daily.   cyanocobalamin  1000 MCG/ML injection Commonly known as: VITAMIN B12 Inject 1 mL (1,000 mcg total) into the muscle once a week.   fluticasone -salmeterol 115-21 MCG/ACT inhaler Commonly known as: Advair  HFA Inhale 2 puffs into the lungs 2 (two) times daily.   levothyroxine  175 MCG tablet Commonly known as: SYNTHROID  Take 1 tablet (175 mcg total) by mouth daily.   liothyronine  5 MCG tablet Commonly known as: CYTOMEL  Take 1 tablet (5 mcg total) by mouth daily.   loperamide  2 MG capsule Commonly known as: IMODIUM  Take 1 capsule (2 mg total) by mouth 2 (two) times daily as needed for diarrhea or loose stools.   methocarbamol  500 MG tablet Commonly known as: ROBAXIN  Take 500 mg by mouth every 8 (eight) hours as needed.   Needles & Syringes Misc 25 ga x 1  IM needle/syringe   omeprazole  20 MG capsule Commonly known as: PRILOSEC Open capsule and sprinkle on food once daily   sertraline  50 MG tablet Commonly known as: ZOLOFT  Take 1  tablet (50 mg total) by mouth daily. Take 1/2 tablet (25 mg) daily x 7 days then increase to 1 full tablet (50 mg) daily.          OBJECTIVE:   PHYSICAL EXAM: VS: BP 114/70 (BP Location: Left Arm, Patient Position: Sitting, Cuff Size: Small)   Pulse 64   Ht 5' (1.524 m)   Wt 117 lb (53.1 kg)   SpO2 97%   BMI 22.85 kg/m     EXAM: General: Pt appears well and is in NAD  Neck: General: Supple without adenopathy. Thyroid : Thyroid  size normal.  No goiter or nodules appreciated.  Lungs: Clear with good BS bilat with no rales,  rhonchi, or wheezes  Heart: Auscultation: RRR.  Abdomen: Normoactive bowel sounds, soft, nontender, without masses or organomegaly palpable  Extremities:  BL LE: No pretibial edema normal ROM and strength.  Mental Status: Judgment, insight: Intact Memory: Intact for recent and remote events Mood and affect: No depression, anxiety, or agitation     DATA REVIEWED:   Latest Reference Range & Units 04/23/23 09:38  TSH 0.40 - 4.50 mIU/L 0.01 (L)  T4,Free(Direct) 0.8 - 1.8 ng/dL 1.6  (L): Data is abnormally low  ASSESSMENT / PLAN / RECOMMENDATIONS:   Hypothyroidism:   - No local neck symptoms  -Patient with variable LT-4 absorption , hence fluctuating TFTs -She is tolerating liothyronine  without any side effects -TSH continues to be suppressed, we will decrease levothyroxine  as below and recheck in 2 months  Medications   Stop levothyroxine  175 mcg daily Start 137 mcg daily  Continue liothyronine  5 mcg daily   Follow-up in 6 months Labs in 2 months      Signed electronically by: Stefano Redgie Butts, MD  [facility]  [facility] [address] [place], [place] [address] Phone: [phone] FAX: [phone]      CC: Willo Mini, NP [address] [place] [address] Phone: [phone]  Fax: [phone]   Return to Endocrinology clinic as below: Future Appointments   Date Time Provider Department Center  04/23/2023  9:10 AM Victorhugo Preis, Donell Redgie, MD [facility] None  05/02/2023  9:00 AM [facility], [facility] None  05/22/2023 11:00 AM [facility], [facility] None  06/13/2023  9:00 AM [facility], [facility] None  09/18/2023  1:20 PM Willo Mini, NP [facility] None

## 2023-04-23 ENCOUNTER — Encounter: Payer: Self-pay | Admitting: Hematology

## 2023-04-23 ENCOUNTER — Encounter: Payer: Self-pay | Admitting: Internal Medicine

## 2023-04-23 ENCOUNTER — Ambulatory Visit: Payer: Medicare Other | Admitting: Internal Medicine

## 2023-04-23 VITALS — BP 114/70 | HR 64 | Ht 60.0 in | Wt 117.0 lb

## 2023-04-23 DIAGNOSIS — E039 Hypothyroidism, unspecified: Secondary | ICD-10-CM | POA: Diagnosis not present

## 2023-04-23 NOTE — Patient Instructions (Signed)
You are on levothyroxine - which is your thyroid hormone supplement. You MUST take this consistently.  You should take this first thing in the morning on an empty stomach with water. You should not take it with other medications. Wait 38min to 1hr prior to eating. If you are taking any vitamins - please take these in the evening.   If you miss a dose, please take your missed dose the following day (double the dose for that day). You should have a pill box for ONLY levothyroxine on your bedside table to help you remember to take your medications.    HOLD Biotin 2-3 days before future thyroid blood work

## 2023-04-24 ENCOUNTER — Encounter: Payer: Self-pay | Admitting: Internal Medicine

## 2023-04-24 LAB — T4, FREE: Free T4: 1.6 ng/dL (ref 0.8–1.8)

## 2023-04-24 LAB — TSH: TSH: 0.01 m[IU]/L — ABNORMAL LOW (ref 0.40–4.50)

## 2023-04-24 MED ORDER — LIOTHYRONINE SODIUM 5 MCG PO TABS: 5.0000 ug | ORAL_TABLET | Freq: Every day | ORAL | 3 refills | Status: DC

## 2023-04-24 MED ORDER — LEVOTHYROXINE SODIUM 137 MCG PO TABS
137.0000 ug | ORAL_TABLET | Freq: Every day | ORAL | 3 refills | Status: DC
Start: 2023-04-24 — End: 2023-09-12

## 2023-05-02 ENCOUNTER — Ambulatory Visit: Payer: Medicare Other | Admitting: Professional

## 2023-05-22 ENCOUNTER — Ambulatory Visit: Payer: Medicare Other | Admitting: Professional

## 2023-05-22 ENCOUNTER — Encounter: Payer: Self-pay | Admitting: Professional

## 2023-05-22 DIAGNOSIS — F4323 Adjustment disorder with mixed anxiety and depressed mood: Secondary | ICD-10-CM | POA: Diagnosis not present
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
## 2023-06-08 ENCOUNTER — Other Ambulatory Visit: Payer: Self-pay | Admitting: Medical-Surgical

## 2023-06-13 ENCOUNTER — Ambulatory Visit: Payer: Medicare Other | Admitting: Professional

## 2023-06-13 ENCOUNTER — Encounter: Payer: Self-pay | Admitting: Professional

## 2023-06-13 DIAGNOSIS — F4323 Adjustment disorder with mixed anxiety and depressed mood: Secondary | ICD-10-CM | POA: Diagnosis not present

## 2023-06-13 NOTE — Progress Notes (Signed)
 [facility] Counselor/Therapist Progress Note  Patient ID: Mariah Kemp, MRN: [id number],    Date: 06/13/2023  Time Spent: 40 minutes [id number]   Treatment Type: Individual Therapy  Risk Assessment: Danger to Self:  No Self-injurious Behavior: No Danger to Others: No  Subjective: The patient arrived on time for her in-person appointment.  Issues addressed: 1-spouse with alzheimer's -is doing much better and has had no additional outbursts -he has found a place to keep his keys where he can remember -pt has not been as engaged with him and this has been helpful -pt plans to leave spouse for more than an afternoon to prepare him for being home alone when she is away with her sister 2-positives -pt's husband suggested they go on a vacation to [facility] and it is now booked for April 21st and pt is very excited -she also has another trip booked with her husband for later this summer -she is going on a girls only weekend with her sister in the near future -pt feels energized by the upcoming trips  Treatment Plan Problems Addressed  Anxiety, Family Conflict, Grief / Loss Unresolved, Type A Behavior Goals 1. Achieve an overall decrease in pressured, driven behaviors. 2. Alleviate sense of time urgency, free-floating anxiety, anger, and self-destructive behaviors. 3. Become a reconstituted/blended family unit that is functional and whose members are bonded to each other. 4. Begin a healthy grieving process around husband's Alzheimer's diagnosis. Objective Attend an Alzheimer's support group. Target Date: 2023-11-21 Frequency: Biweekly  Progress: 0 Modality: individual  Related Interventions Ask the client to attend a grief/loss support group and report to the therapist how he/she felt about attending. Objective Verbalize resolution of feelings of guilt and regret associated with cognitively impaired spouse's condition. Target Date: 2023-11-21 Frequency:  Biweekly  Progress: 0 Modality: individual  Related Interventions Assign the client to make a list of all the regrets associated with actions toward or relationship with the deceased; process the list content toward resolution of these feelings. Objective Identify and voice positives about the cognitively impaired spouse including previous positive experiences, positive characteristics, positive aspects of the relationship, and how these things may be remembered. Target Date: 2023-11-21 Frequency: Biweekly  Progress: 0 Modality: individual  Related Interventions Ask the client to discuss and/or list the positive aspects of and memories about his/her relationship with the lost loved one; reinforce the client's expression of positive memories and emotions (e.g., smiling, laughing); encourage the client to share these thoughts with supportive loved ones. Objective Express thoughts and feelings to the cognitively impaired spouse while he is still capable of comprehending Target Date: 2023-11-21 Frequency: Biweekly  Progress: 0 Modality: individual  Related Interventions Ask the client to write a letter to the lost person describing his/her fond memories and/or painful and regretful memories, and how he/she currently feels life (or assign "Dear : A Letter to a Lost Loved One" in the Adult Psychotherapy Homework Planner by [facility]); process the letter in session. Objective Reengage in activities with family, friends, coworkers, and others. Target Date: 2023-11-21 Frequency: Biweekly  Progress: 0 Modality: individual  Related Interventions Assist the client in recommitting and reengaging in the primary social positive roles in which he/she has functioned prior to the loss. Promote behavioral activation by assisting the client in listing activities which he/she previously enjoyed but has not engaged in since experiencing the loss and then encourage reengagement in these activities (or assign "Identify  and Schedule Pleasant Activities" in the Adult Psychotherapy Homework Planner by Stephannie Li).  Objective Acknowledge dependency on husband and begin to refocus life on independent actions to meet emotional needs. Target Date: 2023-11-21 Frequency: Biweekly  Progress: 0 Modality: individual  Related Interventions Explore the feelings of anger or guilt that surround the loss, helping the client understand the sources for such feelings. Assist the client in identifying how he/she depended upon the significant other, expressing and resolving the accompanying feelings of abandonment and of being left alone. 5. Begin the process of reengaging a relationship with adult son. Objective Family members report a desire for and vision of a new sense of connectedness. Target Date: 2023-11-21 Frequency: Biweekly  Progress: 0 Modality: individual  Objective Describe the conflicts and the causes of conflicts between self and adult son. Target Date: 2023-11-21 Frequency: Biweekly  Progress: 0 Modality: individual  Related Interventions Give verbal permission for the client to have and express own feelings, thoughts, and perspectives in order to foster a sense of autonomy from family. Explore the nature of the client's family conflicts and their perceived causes. Objective Identify own as well as others' role in the family conflicts. Target Date: 2023-11-21 Frequency: Biweekly  Progress: 0 Modality: individual  Related Interventions Confront the client when he/she is not taking responsibility for his/her role in the family conflict and reinforce the client for owning responsibility for his/her contribution to the conflict. Objective Identify ways in which the patient's interactions with son could be tempered with assertiveness. Target Date: 2023-11-21 Frequency: Biweekly  Progress: 0 Modality: individual  Related Interventions Assist the parents in identifying areas that need strengthening in their "parental  team," then work with them to strengthen these areas (or assign "Learning to Parent as a Team" in the Adult Psychotherapy Administrator, arts by Stephannie Li). Objective Report an increase in resolving conflicts with her adult son by talking calmly and assertively rather than aggressively and defensively. Target Date: 2023-11-21 Frequency: Biweekly  Progress: 0 Modality: individual  Related Interventions Use role-playing, role reversal, modeling, and behavioral rehearsal to help the client develop assertive ways to resolve conflict with parents (recommend Your Perfect Right: Assertiveness and Equality in Your Life and Relationships by Carnella Guadalajara). 6. Complete the process of accepting husband's impairment and expected loss of memory. 7. Develop an awareness of how the avoidance of grieving has affected life and begin the healing process. 8. Develop social and recreational activities as a routine part of life. 9. Enhance ability to effectively cope with the full variety of life's worries and anxieties. 10. Formulate and implement a new life attitudinal pattern that allows for a more relaxed pattern of living. Objective Learn and implement calming skills as a lifestyle change and to manage pressure situations. Target Date: 2023-11-21 Frequency: Biweekly  Progress: 0 Modality: individual  Related Interventions Assign the client to implement calming techniques in his/her daily life in general and when facing trigger situations; process the results, reinforcing success and provide corrective feedback toward improvement. Objective Verbalize a commitment to learning new approaches managing self, time, and relationships that emphasize the values of inner and other orientation. Target Date: 2023-11-21 Frequency: Biweekly  Progress: 0 Modality: individual  Related Interventions Ask the client to commit to attempting attitude and behavior changes to promote a healthier, less Type A lifestyle; explore with  him/her what changes need to be made to become less Type A. Objective Learn and implement respectful assertive communication knowledge and skills to replace insensitive directness or verbal aggression that is controlling. Target Date: 2023-11-21 Frequency: Biweekly  Progress: 0 Modality: individual  Related  Interventions Monitor, point out, and reframe the client's actions or verbalizations that reflect a self-centered or critical approach to others; practice alternatives using behavioral strategies such as modeling, role-playing, and/or role reversal. Train the client in assertive communication with emphasis on recognizing and refraining from aggressive communication (e.g., ignoring of the rights of others) to respectful, assertive communication. Objective Learn problem-solving and/or conflict resolution skills to manage interpersonal problems. Target Date: 2023-11-21 Frequency: Biweekly  Progress: 0 Modality: individual  Related Interventions Teach the client conflict resolution skills (e.g., empathy, active listening, "I messages," respectful communication, assertiveness without aggression, compromise); use role- play and modeling to apply these skills to current conflicts. Teach the client problem-solving skills (e.g., define the problem specifically, brainstorm options, list the pros and cons of each option, chose and implement an option, evaluate the outcome); use modeling, role-playing, and behavior rehearsal to apply this skill to several current conflicts (or assign "Plan Before Acting" in the Adult Psychotherapy Homework Planner by Stephannie Li). Objective Demonstrate decreased impatience with others by talking of appreciating and understanding the good qualities in others. Target Date: 2023-11-21 Frequency: Biweekly  Progress: 0 Modality: individual  Related Interventions Assign the client to talk to an associate or child, focusing on listening to the other person and learning several good  things about that person; process the experience. 11. Learn and implement coping skills that result in a reduction of anxiety and worry, and improved daily functioning. 12. Stabilize anxiety level while increasing ability to function on a daily basis. Objective Verbalize an understanding of the cognitive, physiological, and behavioral components of anxiety and its treatment. Target Date: 2023-11-21 Frequency: Biweekly  Progress: 0 Modality: individual  Related Interventions Discuss how generalized anxiety typically involves excessive worry about unrealistic threats, various bodily expressions of tension, overarousal, and hypervigilance, and avoidance of what is threatening that interact to maintain the problem (see Mastery of Your Anxiety and Worry: Therapist Guide by Renard Matter, and Barlow; Treating Generalized Anxiety Disorder by Rygh and Ida Rogue). Discuss how treatment targets worry, anxiety symptoms, and avoidance to help the client manage worry effectively, reduce overarousal, and eliminate unnecessary avoidance. Objective Learn and implement calming skills to reduce overall anxiety and manage anxiety symptoms. Target Date: 2023-11-21 Frequency: Biweekly  Progress: 0 Modality: individual  Related Interventions Teach the client calming/relaxation skills (e.g., applied relaxation, progressive muscle relaxation, cue controlled relaxation; mindful breathing; biofeedback) and how to discriminate better between relaxation and tension; teach the client how to apply these skills to his/her daily life (e.g., New Directions in Progressive Muscle Relaxation by Marcelyn Ditty, and Hazlett-Stevens; Treating Generalized Anxiety Disorder by Rygh and Ida Rogue). Assign the client to read about progressive muscle relaxation and other calming strategies in relevant books or treatment manuals (e.g., Progressive Relaxation Training by Robb Matar and Alen Blew; Mastery of Your Anxiety and Worry: Workbook  by Earlie Counts). Objective Verbalize an understanding of the role that cognitive biases play in excessive irrational worry and persistent anxiety symptoms. Target Date: 2023-11-21 Frequency: Biweekly  Progress: 0 Modality: individual  Related Interventions Discuss examples demonstrating that unrealistic worry typically overestimates the probability of threats and underestimates or overlooks the client's ability to manage realistic demands (or assign "Past Successful Anxiety Coping" in the Adult Psychotherapy Homework Planner by [facility]). Assist the client in analyzing his/her worries by examining potential biases such as the probability of the negative expectation occurring, the real consequences of it occurring, his/her ability to control the outcome, the worst possible outcome, and his/her ability to accept it (see "Analyze the  Probability of a Feared Event" in the Adult Psychotherapy Homework Planner by Stephannie Li; Cognitive Therapy of Anxiety Disorders by Laurence Slate). Objective Learn to accept limitations in life and commit to tolerating, rather than avoiding, unpleasant emotions while accomplishing meaningful goals. Target Date: 2023-11-21 Frequency: Biweekly  Progress: 0 Modality: individual  Related Interventions Use techniques from Acceptance and Commitment Therapy to help client accept uncomfortable realities such as lack of complete control, imperfections, and uncertainty and tolerate unpleasant emotions and thoughts in order to accomplish value-consistent goals. Objective Reestablish a consistent sleep-wake cycle. Target Date: 2023-11-21 Frequency: Biweekly  Progress: 0 Modality: individual  Related Interventions Teach and implement sleep hygiene practices to help the client reestablish a consistent sleep-wake cycle; review, reinforce success, and provide corrective feedback toward improvement.  Diagnosis:Adjustment disorder with mixed anxiety and depressed mood  Plan:   -meet again on Thursday, July 03, 2023 at 1pm in-person

## 2023-06-23 ENCOUNTER — Other Ambulatory Visit: Payer: Medicare Other

## 2023-06-23 LAB — TSH: TSH: 12.86 m[IU]/L — ABNORMAL HIGH (ref 0.40–4.50)

## 2023-06-23 LAB — T4, FREE: Free T4: 0.7 ng/dL — ABNORMAL LOW (ref 0.8–1.8)

## 2023-06-24 MED ORDER — LIOTHYRONINE SODIUM 5 MCG PO TABS: 5.0000 ug | ORAL_TABLET | Freq: Two times a day (BID) | ORAL | 3 refills | Status: DC

## 2023-06-24 NOTE — Addendum Note (Signed)
 Addended by: Scarlette Shorts on: 06/24/2023 12:00 PM   Modules accepted: Orders

## 2023-07-03 ENCOUNTER — Ambulatory Visit (INDEPENDENT_AMBULATORY_CARE_PROVIDER_SITE_OTHER): Payer: Medicare Other | Admitting: Professional

## 2023-07-03 ENCOUNTER — Encounter: Payer: Self-pay | Admitting: Professional

## 2023-07-03 DIAGNOSIS — F4323 Adjustment disorder with mixed anxiety and depressed mood: Secondary | ICD-10-CM

## 2023-07-03 NOTE — Progress Notes (Signed)
 [facility] Counselor/Therapist Progress Note  Patient ID: Mariah Kemp, MRN: [id number],    Date: 07/03/2023  Time Spent: 45 minutes 1-145pm   Treatment Type: Individual Therapy  Risk Assessment: Danger to Self:  No Self-injurious Behavior: No Danger to Others: No  Subjective: The patient arrived on time for her in-person appointment.  Issues addressed: 1-spouse with alzheimer's -continues to manage without outbursts -pt has adjusted to allowing him to make decisions for himself -she feels less anxious regarding his behaviors at home -pt does feel some increased anxiety related to the [place] trip -pt concerned of the potential to lose him in [place] -discussed strategies for feeling more confident  Treatment Plan Problems Addressed  Anxiety, Family Conflict, Grief / Loss Unresolved, Type A Behavior Goals 1. Achieve an overall decrease in pressured, driven behaviors. 2. Alleviate sense of time urgency, free-floating anxiety, anger, and self-destructive behaviors. 3. Become a reconstituted/blended family unit that is functional and whose members are bonded to each other. 4. Begin a healthy grieving process around husband's Alzheimer's diagnosis. Objective Attend an Alzheimer's support group. Target Date: 2023-11-21 Frequency: Biweekly  Progress: 0 Modality: individual  Related Interventions Ask the client to attend a grief/loss support group and report to the therapist how he/she felt about attending. Objective Verbalize resolution of feelings of guilt and regret associated with cognitively impaired spouse's condition. Target Date: 2023-11-21 Frequency: Biweekly  Progress: 0 Modality: individual  Related Interventions Assign the client to make a list of all the regrets associated with actions toward or relationship with the deceased; process the list content toward resolution of these feelings. Objective Identify and voice positives about the cognitively  impaired spouse including previous positive experiences, positive characteristics, positive aspects of the relationship, and how these things may be remembered. Target Date: 2023-11-21 Frequency: Biweekly  Progress: 0 Modality: individual  Related Interventions Ask the client to discuss and/or list the positive aspects of and memories about his/her relationship with the lost loved one; reinforce the client's expression of positive memories and emotions (e.g., smiling, laughing); encourage the client to share these thoughts with supportive loved ones. Objective Express thoughts and feelings to the cognitively impaired spouse while he is still capable of comprehending Target Date: 2023-11-21 Frequency: Biweekly  Progress: 0 Modality: individual  Related Interventions Ask the client to write a letter to the lost person describing his/her fond memories and/or painful and regretful memories, and how he/she currently feels life (or assign "Dear : A Letter to a Lost Loved One" in the Adult Psychotherapy Homework Planner by [facility]); process the letter in session. Objective Reengage in activities with family, friends, coworkers, and others. Target Date: 2023-11-21 Frequency: Biweekly  Progress: 0 Modality: individual  Related Interventions Assist the client in recommitting and reengaging in the primary social positive roles in which he/she has functioned prior to the loss. Promote behavioral activation by assisting the client in listing activities which he/she previously enjoyed but has not engaged in since experiencing the loss and then encourage reengagement in these activities (or assign "Identify and Schedule Pleasant Activities" in the Adult Psychotherapy Homework Planner by Beacher Bottoms). Objective Acknowledge dependency on husband and begin to refocus life on independent actions to meet emotional needs. Target Date: 2023-11-21 Frequency: Biweekly  Progress: 0 Modality: individual  Related  Interventions Explore the feelings of anger or guilt that surround the loss, helping the client understand the sources for such feelings. Assist the client in identifying how he/she depended upon the significant other, expressing and resolving the accompanying feelings  of abandonment and of being left alone. 5. Begin the process of reengaging a relationship with adult son. Objective Family members report a desire for and vision of a new sense of connectedness. Target Date: 2023-11-21 Frequency: Biweekly  Progress: 0 Modality: individual  Objective Describe the conflicts and the causes of conflicts between self and adult son. Target Date: 2023-11-21 Frequency: Biweekly  Progress: 0 Modality: individual  Related Interventions Give verbal permission for the client to have and express own feelings, thoughts, and perspectives in order to foster a sense of autonomy from family. Explore the nature of the client's family conflicts and their perceived causes. Objective Identify own as well as others' role in the family conflicts. Target Date: 2023-11-21 Frequency: Biweekly  Progress: 0 Modality: individual  Related Interventions Confront the client when he/she is not taking responsibility for his/her role in the family conflict and reinforce the client for owning responsibility for his/her contribution to the conflict. Objective Identify ways in which the patient's interactions with son could be tempered with assertiveness. Target Date: 2023-11-21 Frequency: Biweekly  Progress: 0 Modality: individual  Related Interventions Assist the parents in identifying areas that need strengthening in their "parental team," then work with them to strengthen these areas (or assign "Learning to Parent as a Team" in the Adult Psychotherapy Administrator, arts by Beacher Bottoms). Objective Report an increase in resolving conflicts with her adult son by talking calmly and assertively rather than aggressively and  defensively. Target Date: 2023-11-21 Frequency: Biweekly  Progress: 0 Modality: individual  Related Interventions Use role-playing, role reversal, modeling, and behavioral rehearsal to help the client develop assertive ways to resolve conflict with parents (recommend Your Perfect Right: Assertiveness and Equality in Your Life and Relationships by Angelo Barge). 6. Complete the process of accepting husband's impairment and expected loss of memory. 7. Develop an awareness of how the avoidance of grieving has affected life and begin the healing process. 8. Develop social and recreational activities as a routine part of life. 9. Enhance ability to effectively cope with the full variety of life's worries and anxieties. 10. Formulate and implement a new life attitudinal pattern that allows for a more relaxed pattern of living. Objective Learn and implement calming skills as a lifestyle change and to manage pressure situations. Target Date: 2023-11-21 Frequency: Biweekly  Progress: 0 Modality: individual  Related Interventions Assign the client to implement calming techniques in his/her daily life in general and when facing trigger situations; process the results, reinforcing success and provide corrective feedback toward improvement. Objective Verbalize a commitment to learning new approaches managing self, time, and relationships that emphasize the values of inner and other orientation. Target Date: 2023-11-21 Frequency: Biweekly  Progress: 0 Modality: individual  Related Interventions Ask the client to commit to attempting attitude and behavior changes to promote a healthier, less Type A lifestyle; explore with him/her what changes need to be made to become less Type A. Objective Learn and implement respectful assertive communication knowledge and skills to replace insensitive directness or verbal aggression that is controlling. Target Date: 2023-11-21 Frequency: Biweekly  Progress: 0  Modality: individual  Related Interventions Monitor, point out, and reframe the client's actions or verbalizations that reflect a self-centered or critical approach to others; practice alternatives using behavioral strategies such as modeling, role-playing, and/or role reversal. Train the client in assertive communication with emphasis on recognizing and refraining from aggressive communication (e.g., ignoring of the rights of others) to respectful, assertive communication. Objective Learn problem-solving and/or conflict resolution skills to manage interpersonal  problems. Target Date: 2023-11-21 Frequency: Biweekly  Progress: 0 Modality: individual  Related Interventions Teach the client conflict resolution skills (e.g., empathy, active listening, "I messages," respectful communication, assertiveness without aggression, compromise); use role- play and modeling to apply these skills to current conflicts. Teach the client problem-solving skills (e.g., define the problem specifically, brainstorm options, list the pros and cons of each option, chose and implement an option, evaluate the outcome); use modeling, role-playing, and behavior rehearsal to apply this skill to several current conflicts (or assign "Plan Before Acting" in the Adult Psychotherapy Homework Planner by Beacher Bottoms). Objective Demonstrate decreased impatience with others by talking of appreciating and understanding the good qualities in others. Target Date: 2023-11-21 Frequency: Biweekly  Progress: 0 Modality: individual  Related Interventions Assign the client to talk to an associate or child, focusing on listening to the other person and learning several good things about that person; process the experience. 11. Learn and implement coping skills that result in a reduction of anxiety and worry, and improved daily functioning. 12. Stabilize anxiety level while increasing ability to function on a daily basis. Objective Verbalize an  understanding of the cognitive, physiological, and behavioral components of anxiety and its treatment. Target Date: 2023-11-21 Frequency: Biweekly  Progress: 0 Modality: individual  Related Interventions Discuss how generalized anxiety typically involves excessive worry about unrealistic threats, various bodily expressions of tension, overarousal, and hypervigilance, and avoidance of what is threatening that interact to maintain the problem (see Mastery of Your Anxiety and Worry: Therapist Guide by Jame Maze, and Barlow; Treating Generalized Anxiety Disorder by Rygh and Joya Nissen). Discuss how treatment targets worry, anxiety symptoms, and avoidance to help the client manage worry effectively, reduce overarousal, and eliminate unnecessary avoidance. Objective Learn and implement calming skills to reduce overall anxiety and manage anxiety symptoms. Target Date: 2023-11-21 Frequency: Biweekly  Progress: 0 Modality: individual  Related Interventions Teach the client calming/relaxation skills (e.g., applied relaxation, progressive muscle relaxation, cue controlled relaxation; mindful breathing; biofeedback) and how to discriminate better between relaxation and tension; teach the client how to apply these skills to his/her daily life (e.g., New Directions in Progressive Muscle Relaxation by Fara Hone, and Hazlett-Stevens; Treating Generalized Anxiety Disorder by Rygh and Joya Nissen). Assign the client to read about progressive muscle relaxation and other calming strategies in relevant books or treatment manuals (e.g., Progressive Relaxation Training by Rodolfo Clan and Arvil Birks; Mastery of Your Anxiety and Worry: Workbook by Rodney Clamp). Objective Verbalize an understanding of the role that cognitive biases play in excessive irrational worry and persistent anxiety symptoms. Target Date: 2023-11-21 Frequency: Biweekly  Progress: 0 Modality: individual  Related Interventions Discuss  examples demonstrating that unrealistic worry typically overestimates the probability of threats and underestimates or overlooks the client's ability to manage realistic demands (or assign "Past Successful Anxiety Coping" in the Adult Psychotherapy Homework Planner by [facility]). Assist the client in analyzing his/her worries by examining potential biases such as the probability of the negative expectation occurring, the real consequences of it occurring, his/her ability to control the outcome, the worst possible outcome, and his/her ability to accept it (see "Analyze the Probability of a Feared Event" in the Adult Psychotherapy Homework Planner by Beacher Bottoms; Cognitive Therapy of Anxiety Disorders by Anderson Kaufman). Objective Learn to accept limitations in life and commit to tolerating, rather than avoiding, unpleasant emotions while accomplishing meaningful goals. Target Date: 2023-11-21 Frequency: Biweekly  Progress: 0 Modality: individual  Related Interventions Use techniques from Acceptance and Commitment Therapy to help client accept uncomfortable realities  such as lack of complete control, imperfections, and uncertainty and tolerate unpleasant emotions and thoughts in order to accomplish value-consistent goals. Objective Reestablish a consistent sleep-wake cycle. Target Date: 2023-11-21 Frequency: Biweekly  Progress: 0 Modality: individual  Related Interventions Teach and implement sleep hygiene practices to help the client reestablish a consistent sleep-wake cycle; review, reinforce success, and provide corrective feedback toward improvement.  Diagnosis:Adjustment disorder with mixed anxiety and depressed mood  Plan:  -meet again on Friday, Jul 25, 2023 at [facility] in-person

## 2023-07-25 ENCOUNTER — Encounter: Payer: Self-pay | Admitting: Professional

## 2023-07-25 ENCOUNTER — Ambulatory Visit (INDEPENDENT_AMBULATORY_CARE_PROVIDER_SITE_OTHER): Admitting: Professional

## 2023-07-25 DIAGNOSIS — F4323 Adjustment disorder with mixed anxiety and depressed mood: Secondary | ICD-10-CM | POA: Diagnosis not present

## 2023-07-25 NOTE — Progress Notes (Unsigned)
 [facility] Counselor/Therapist Progress Note  Patient ID: Mariah Kemp, MRN: [id number],    Date: 07/25/2023  Time Spent: 42 minutes 904-946am  Treatment Type: Individual Therapy  Risk Assessment: Danger to Self:  No Self-injurious Behavior: No Danger to Others: No  Subjective: The patient arrived on time for her in-person appointment.  Issues addressed: 1-trip to [place] -was wonderful -they did a few shows and went to the [facility] -they walked a lot -her husband stayed with her the entire time and she could relax -did not go out at night as much as she would have liked but respected his needs -doesn't think she would go again -her memory of the trip with her mother and sister could not be outdone 3-trip with sister to [place] -a little concerned about leaving Marlou Sims alone -has everything written down for him and people who will be checking in on him -acknowledges that it is her own anxiety and that things will be fine  Treatment Plan Problems Addressed  Anxiety, Family Conflict, Grief / Loss Unresolved, Type A Behavior Goals 1. Achieve an overall decrease in pressured, driven behaviors. 2. Alleviate sense of time urgency, free-floating anxiety, anger, and self-destructive behaviors. 3. Become a reconstituted/blended family unit that is functional and whose members are bonded to each other. 4. Begin a healthy grieving process around husband's Alzheimer's diagnosis. Objective Attend an Alzheimer's support group. Target Date: 2023-11-21 Frequency: Biweekly  Progress: 0 Modality: individual  Related Interventions Ask the client to attend a grief/loss support group and report to the therapist how he/she felt about attending. Objective Verbalize resolution of feelings of guilt and regret associated with cognitively impaired spouse's condition. Target Date: 2023-11-21 Frequency: Biweekly  Progress: 0 Modality: individual  Related Interventions Assign  the client to make a list of all the regrets associated with actions toward or relationship with the deceased; process the list content toward resolution of these feelings. Objective Identify and voice positives about the cognitively impaired spouse including previous positive experiences, positive characteristics, positive aspects of the relationship, and how these things may be remembered. Target Date: 2023-11-21 Frequency: Biweekly  Progress: 0 Modality: individual  Related Interventions Ask the client to discuss and/or list the positive aspects of and memories about his/her relationship with the lost loved one; reinforce the client's expression of positive memories and emotions (e.g., smiling, laughing); encourage the client to share these thoughts with supportive loved ones. Objective Express thoughts and feelings to the cognitively impaired spouse while he is still capable of comprehending Target Date: 2023-11-21 Frequency: Biweekly  Progress: 0 Modality: individual  Related Interventions Ask the client to write a letter to the lost person describing his/her fond memories and/or painful and regretful memories, and how he/she currently feels life (or assign "Dear : A Letter to a Lost Loved One" in the Adult Psychotherapy Homework Planner by [facility]); process the letter in session. Objective Reengage in activities with family, friends, coworkers, and others. Target Date: 2023-11-21 Frequency: Biweekly  Progress: 0 Modality: individual  Related Interventions Assist the client in recommitting and reengaging in the primary social positive roles in which he/she has functioned prior to the loss. Promote behavioral activation by assisting the client in listing activities which he/she previously enjoyed but has not engaged in since experiencing the loss and then encourage reengagement in these activities (or assign "Identify and Schedule Pleasant Activities" in the Adult Psychotherapy Homework  Planner by Beacher Bottoms). Objective Acknowledge dependency on husband and begin to refocus life on independent actions to meet  emotional needs. Target Date: 2023-11-21 Frequency: Biweekly  Progress: 0 Modality: individual  Related Interventions Explore the feelings of anger or guilt that surround the loss, helping the client understand the sources for such feelings. Assist the client in identifying how he/she depended upon the significant other, expressing and resolving the accompanying feelings of abandonment and of being left alone. 5. Begin the process of reengaging a relationship with adult son. Objective Family members report a desire for and vision of a new sense of connectedness. Target Date: 2023-11-21 Frequency: Biweekly  Progress: 0 Modality: individual  Objective Describe the conflicts and the causes of conflicts between self and adult son. Target Date: 2023-11-21 Frequency: Biweekly  Progress: 0 Modality: individual  Related Interventions Give verbal permission for the client to have and express own feelings, thoughts, and perspectives in order to foster a sense of autonomy from family. Explore the nature of the client's family conflicts and their perceived causes. Objective Identify own as well as others' role in the family conflicts. Target Date: 2023-11-21 Frequency: Biweekly  Progress: 0 Modality: individual  Related Interventions Confront the client when he/she is not taking responsibility for his/her role in the family conflict and reinforce the client for owning responsibility for his/her contribution to the conflict. Objective Identify ways in which the patient's interactions with son could be tempered with assertiveness. Target Date: 2023-11-21 Frequency: Biweekly  Progress: 0 Modality: individual  Related Interventions Assist the parents in identifying areas that need strengthening in their "parental team," then work with them to strengthen these areas (or assign  "Learning to Parent as a Team" in the Adult Psychotherapy Administrator, arts by Beacher Bottoms). Objective Report an increase in resolving conflicts with her adult son by talking calmly and assertively rather than aggressively and defensively. Target Date: 2023-11-21 Frequency: Biweekly  Progress: 0 Modality: individual  Related Interventions Use role-playing, role reversal, modeling, and behavioral rehearsal to help the client develop assertive ways to resolve conflict with parents (recommend Your Perfect Right: Assertiveness and Equality in Your Life and Relationships by Angelo Barge). 6. Complete the process of accepting husband's impairment and expected loss of memory. 7. Develop an awareness of how the avoidance of grieving has affected life and begin the healing process. 8. Develop social and recreational activities as a routine part of life. 9. Enhance ability to effectively cope with the full variety of life's worries and anxieties. 10. Formulate and implement a new life attitudinal pattern that allows for a more relaxed pattern of living. Objective Learn and implement calming skills as a lifestyle change and to manage pressure situations. Target Date: 2023-11-21 Frequency: Biweekly  Progress: 0 Modality: individual  Related Interventions Assign the client to implement calming techniques in his/her daily life in general and when facing trigger situations; process the results, reinforcing success and provide corrective feedback toward improvement. Objective Verbalize a commitment to learning new approaches managing self, time, and relationships that emphasize the values of inner and other orientation. Target Date: 2023-11-21 Frequency: Biweekly  Progress: 0 Modality: individual  Related Interventions Ask the client to commit to attempting attitude and behavior changes to promote a healthier, less Type A lifestyle; explore with him/her what changes need to be made to become less Type  A. Objective Learn and implement respectful assertive communication knowledge and skills to replace insensitive directness or verbal aggression that is controlling. Target Date: 2023-11-21 Frequency: Biweekly  Progress: 0 Modality: individual  Related Interventions Monitor, point out, and reframe the client's actions or verbalizations that reflect a self-centered  or critical approach to others; practice alternatives using behavioral strategies such as modeling, role-playing, and/or role reversal. Train the client in assertive communication with emphasis on recognizing and refraining from aggressive communication (e.g., ignoring of the rights of others) to respectful, assertive communication. Objective Learn problem-solving and/or conflict resolution skills to manage interpersonal problems. Target Date: 2023-11-21 Frequency: Biweekly  Progress: 0 Modality: individual  Related Interventions Teach the client conflict resolution skills (e.g., empathy, active listening, "I messages," respectful communication, assertiveness without aggression, compromise); use role- play and modeling to apply these skills to current conflicts. Teach the client problem-solving skills (e.g., define the problem specifically, brainstorm options, list the pros and cons of each option, chose and implement an option, evaluate the outcome); use modeling, role-playing, and behavior rehearsal to apply this skill to several current conflicts (or assign "Plan Before Acting" in the Adult Psychotherapy Homework Planner by Beacher Bottoms). Objective Demonstrate decreased impatience with others by talking of appreciating and understanding the good qualities in others. Target Date: 2023-11-21 Frequency: Biweekly  Progress: 0 Modality: individual  Related Interventions Assign the client to talk to an associate or child, focusing on listening to the other person and learning several good things about that person; process the experience. 11.  Learn and implement coping skills that result in a reduction of anxiety and worry, and improved daily functioning. 12. Stabilize anxiety level while increasing ability to function on a daily basis. Objective Verbalize an understanding of the cognitive, physiological, and behavioral components of anxiety and its treatment. Target Date: 2023-11-21 Frequency: Biweekly  Progress: 0 Modality: individual  Related Interventions Discuss how generalized anxiety typically involves excessive worry about unrealistic threats, various bodily expressions of tension, overarousal, and hypervigilance, and avoidance of what is threatening that interact to maintain the problem (see Mastery of Your Anxiety and Worry: Therapist Guide by Jame Maze, and Barlow; Treating Generalized Anxiety Disorder by Rygh and Joya Nissen). Discuss how treatment targets worry, anxiety symptoms, and avoidance to help the client manage worry effectively, reduce overarousal, and eliminate unnecessary avoidance. Objective Learn and implement calming skills to reduce overall anxiety and manage anxiety symptoms. Target Date: 2023-11-21 Frequency: Biweekly  Progress: 0 Modality: individual  Related Interventions Teach the client calming/relaxation skills (e.g., applied relaxation, progressive muscle relaxation, cue controlled relaxation; mindful breathing; biofeedback) and how to discriminate better between relaxation and tension; teach the client how to apply these skills to his/her daily life (e.g., New Directions in Progressive Muscle Relaxation by Fara Hone, and Hazlett-Stevens; Treating Generalized Anxiety Disorder by Rygh and Joya Nissen). Assign the client to read about progressive muscle relaxation and other calming strategies in relevant books or treatment manuals (e.g., Progressive Relaxation Training by Rodolfo Clan and Arvil Birks; Mastery of Your Anxiety and Worry: Workbook by Rodney Clamp). Objective Verbalize an  understanding of the role that cognitive biases play in excessive irrational worry and persistent anxiety symptoms. Target Date: 2023-11-21 Frequency: Biweekly  Progress: 0 Modality: individual  Related Interventions Discuss examples demonstrating that unrealistic worry typically overestimates the probability of threats and underestimates or overlooks the client's ability to manage realistic demands (or assign "Past Successful Anxiety Coping" in the Adult Psychotherapy Homework Planner by [facility]). Assist the client in analyzing his/her worries by examining potential biases such as the probability of the negative expectation occurring, the real consequences of it occurring, his/her ability to control the outcome, the worst possible outcome, and his/her ability to accept it (see "Analyze the Probability of a Feared Event" in the Adult Psychotherapy Homework Planner by Beacher Bottoms; Cognitive Therapy  of Anxiety Disorders by Anderson Kaufman). Objective Learn to accept limitations in life and commit to tolerating, rather than avoiding, unpleasant emotions while accomplishing meaningful goals. Target Date: 2023-11-21 Frequency: Biweekly  Progress: 0 Modality: individual  Related Interventions Use techniques from Acceptance and Commitment Therapy to help client accept uncomfortable realities such as lack of complete control, imperfections, and uncertainty and tolerate unpleasant emotions and thoughts in order to accomplish value-consistent goals. Objective Reestablish a consistent sleep-wake cycle. Target Date: 2023-11-21 Frequency: Biweekly  Progress: 0 Modality: individual  Related Interventions Teach and implement sleep hygiene practices to help the client reestablish a consistent sleep-wake cycle; review, reinforce success, and provide corrective feedback toward improvement.  Diagnosis:Adjustment disorder with mixed anxiety and depressed mood  Plan:  -meet again on Thursday, Aug 14, 2023 at 11am  in-person

## 2023-08-06 ENCOUNTER — Encounter: Payer: Self-pay | Admitting: Medical-Surgical

## 2023-08-06 DIAGNOSIS — Z636 Dependent relative needing care at home: Secondary | ICD-10-CM

## 2023-08-07 MED ORDER — SERTRALINE HCL 50 MG PO TABS: 50.0000 mg | ORAL_TABLET | Freq: Every day | ORAL | 0 refills | Status: DC

## 2023-08-14 ENCOUNTER — Ambulatory Visit: Admitting: Professional

## 2023-08-14 ENCOUNTER — Encounter: Payer: Self-pay | Admitting: Professional

## 2023-08-14 DIAGNOSIS — F4323 Adjustment disorder with mixed anxiety and depressed mood: Secondary | ICD-10-CM

## 2023-08-14 NOTE — Progress Notes (Signed)
 [facility] Counselor/Therapist Progress Note  Patient ID: Mariah Kemp, MRN: [id number],    Date: 08/14/2023  Time Spent: 53 minutes 1107am-12pm  Treatment Type: Individual Therapy  Risk Assessment: Danger to Self:  No Self-injurious Behavior: No Danger to Others: No  Subjective: The patient arrived on time for her in-person appointment.  Issues addressed: 1-trip to [place] with sister was excellent -Marlou Sims did great by himself and took good care of their dog -her daughter and grandson checked on him and he had no problems -he was sunbathing on the back deck when grandson came to visit -acknowledges that she spoke to him on two occasions and didn't really think about him otherwise 2-planning trip with Marlou Sims to [place] in July -will be there for a week -pt feels good about their ability to successfully travel 3-wants to take some bus trips to remove her responsibility for driving 4-Walter placed on antidepressant to assist with sundowning -MD recommended pt continue to take short overnight breaks from [facility]  Treatment Plan Problems Addressed  Anxiety, Family Conflict, Grief / Loss Unresolved, Type A Behavior Goals 1. Achieve an overall decrease in pressured, driven behaviors. 2. Alleviate sense of time urgency, free-floating anxiety, anger, and self-destructive behaviors. 3. Become a reconstituted/blended family unit that is functional and whose members are bonded to each other. 4. Begin a healthy grieving process around husband's Alzheimer's diagnosis. Objective Attend an Alzheimer's support group. Target Date: 2023-11-21 Frequency: Biweekly  Progress: 0 Modality: individual  Related Interventions Ask the client to attend a grief/loss support group and report to the therapist how he/she felt about attending. Objective Verbalize resolution of feelings of guilt and regret associated with cognitively impaired spouse's condition. Target Date:  2023-11-21 Frequency: Biweekly  Progress: 0 Modality: individual  Related Interventions Assign the client to make a list of all the regrets associated with actions toward or relationship with the deceased; process the list content toward resolution of these feelings. Objective Identify and voice positives about the cognitively impaired spouse including previous positive experiences, positive characteristics, positive aspects of the relationship, and how these things may be remembered. Target Date: 2023-11-21 Frequency: Biweekly  Progress: 0 Modality: individual  Related Interventions Ask the client to discuss and/or list the positive aspects of and memories about his/her relationship with the lost loved one; reinforce the client's expression of positive memories and emotions (e.g., smiling, laughing); encourage the client to share these thoughts with supportive loved ones. Objective Express thoughts and feelings to the cognitively impaired spouse while he is still capable of comprehending Target Date: 2023-11-21 Frequency: Biweekly  Progress: 0 Modality: individual  Related Interventions Ask the client to write a letter to the lost person describing his/her fond memories and/or painful and regretful memories, and how he/she currently feels life (or assign "Dear : A Letter to a Lost Loved One" in the Adult Psychotherapy Homework Planner by [facility]); process the letter in session. Objective Reengage in activities with family, friends, coworkers, and others. Target Date: 2023-11-21 Frequency: Biweekly  Progress: 0 Modality: individual  Related Interventions Assist the client in recommitting and reengaging in the primary social positive roles in which he/she has functioned prior to the loss. Promote behavioral activation by assisting the client in listing activities which he/she previously enjoyed but has not engaged in since experiencing the loss and then encourage reengagement in these activities  (or assign "Identify and Schedule Pleasant Activities" in the Adult Psychotherapy Homework Planner by Beacher Bottoms). Objective Acknowledge dependency on husband and begin to refocus life on  independent actions to meet emotional needs. Target Date: 2023-11-21 Frequency: Biweekly  Progress: 0 Modality: individual  Related Interventions Explore the feelings of anger or guilt that surround the loss, helping the client understand the sources for such feelings. Assist the client in identifying how he/she depended upon the significant other, expressing and resolving the accompanying feelings of abandonment and of being left alone. 5. Begin the process of reengaging a relationship with adult son. Objective Family members report a desire for and vision of a new sense of connectedness. Target Date: 2023-11-21 Frequency: Biweekly  Progress: 0 Modality: individual  Objective Describe the conflicts and the causes of conflicts between self and adult son. Target Date: 2023-11-21 Frequency: Biweekly  Progress: 0 Modality: individual  Related Interventions Give verbal permission for the client to have and express own feelings, thoughts, and perspectives in order to foster a sense of autonomy from family. Explore the nature of the client's family conflicts and their perceived causes. Objective Identify own as well as others' role in the family conflicts. Target Date: 2023-11-21 Frequency: Biweekly  Progress: 0 Modality: individual  Related Interventions Confront the client when he/she is not taking responsibility for his/her role in the family conflict and reinforce the client for owning responsibility for his/her contribution to the conflict. Objective Identify ways in which the patient's interactions with son could be tempered with assertiveness. Target Date: 2023-11-21 Frequency: Biweekly  Progress: 0 Modality: individual  Related Interventions Assist the parents in identifying areas that need  strengthening in their "parental team," then work with them to strengthen these areas (or assign "Learning to Parent as a Team" in the Adult Psychotherapy Administrator, arts by Beacher Bottoms). Objective Report an increase in resolving conflicts with her adult son by talking calmly and assertively rather than aggressively and defensively. Target Date: 2023-11-21 Frequency: Biweekly  Progress: 0 Modality: individual  Related Interventions Use role-playing, role reversal, modeling, and behavioral rehearsal to help the client develop assertive ways to resolve conflict with parents (recommend Your Perfect Right: Assertiveness and Equality in Your Life and Relationships by Angelo Barge). 6. Complete the process of accepting husband's impairment and expected loss of memory. 7. Develop an awareness of how the avoidance of grieving has affected life and begin the healing process. 8. Develop social and recreational activities as a routine part of life. 9. Enhance ability to effectively cope with the full variety of life's worries and anxieties. 10. Formulate and implement a new life attitudinal pattern that allows for a more relaxed pattern of living. Objective Learn and implement calming skills as a lifestyle change and to manage pressure situations. Target Date: 2023-11-21 Frequency: Biweekly  Progress: 0 Modality: individual  Related Interventions Assign the client to implement calming techniques in his/her daily life in general and when facing trigger situations; process the results, reinforcing success and provide corrective feedback toward improvement. Objective Verbalize a commitment to learning new approaches managing self, time, and relationships that emphasize the values of inner and other orientation. Target Date: 2023-11-21 Frequency: Biweekly  Progress: 0 Modality: individual  Related Interventions Ask the client to commit to attempting attitude and behavior changes to promote a healthier,  less Type A lifestyle; explore with him/her what changes need to be made to become less Type A. Objective Learn and implement respectful assertive communication knowledge and skills to replace insensitive directness or verbal aggression that is controlling. Target Date: 2023-11-21 Frequency: Biweekly  Progress: 0 Modality: individual  Related Interventions Monitor, point out, and reframe the client's actions or verbalizations  that reflect a self-centered or critical approach to others; practice alternatives using behavioral strategies such as modeling, role-playing, and/or role reversal. Train the client in assertive communication with emphasis on recognizing and refraining from aggressive communication (e.g., ignoring of the rights of others) to respectful, assertive communication. Objective Learn problem-solving and/or conflict resolution skills to manage interpersonal problems. Target Date: 2023-11-21 Frequency: Biweekly  Progress: 0 Modality: individual  Related Interventions Teach the client conflict resolution skills (e.g., empathy, active listening, "I messages," respectful communication, assertiveness without aggression, compromise); use role- play and modeling to apply these skills to current conflicts. Teach the client problem-solving skills (e.g., define the problem specifically, brainstorm options, list the pros and cons of each option, chose and implement an option, evaluate the outcome); use modeling, role-playing, and behavior rehearsal to apply this skill to several current conflicts (or assign "Plan Before Acting" in the Adult Psychotherapy Homework Planner by Beacher Bottoms). Objective Demonstrate decreased impatience with others by talking of appreciating and understanding the good qualities in others. Target Date: 2023-11-21 Frequency: Biweekly  Progress: 0 Modality: individual  Related Interventions Assign the client to talk to an associate or child, focusing on listening to the  other person and learning several good things about that person; process the experience. 11. Learn and implement coping skills that result in a reduction of anxiety and worry, and improved daily functioning. 12. Stabilize anxiety level while increasing ability to function on a daily basis. Objective Verbalize an understanding of the cognitive, physiological, and behavioral components of anxiety and its treatment. Target Date: 2023-11-21 Frequency: Biweekly  Progress: 0 Modality: individual  Related Interventions Discuss how generalized anxiety typically involves excessive worry about unrealistic threats, various bodily expressions of tension, overarousal, and hypervigilance, and avoidance of what is threatening that interact to maintain the problem (see Mastery of Your Anxiety and Worry: Therapist Guide by Jame Maze, and Barlow; Treating Generalized Anxiety Disorder by Rygh and Joya Nissen). Discuss how treatment targets worry, anxiety symptoms, and avoidance to help the client manage worry effectively, reduce overarousal, and eliminate unnecessary avoidance. Objective Learn and implement calming skills to reduce overall anxiety and manage anxiety symptoms. Target Date: 2023-11-21 Frequency: Biweekly  Progress: 0 Modality: individual  Related Interventions Teach the client calming/relaxation skills (e.g., applied relaxation, progressive muscle relaxation, cue controlled relaxation; mindful breathing; biofeedback) and how to discriminate better between relaxation and tension; teach the client how to apply these skills to his/her daily life (e.g., New Directions in Progressive Muscle Relaxation by Fara Hone, and Hazlett-Stevens; Treating Generalized Anxiety Disorder by Rygh and Joya Nissen). Assign the client to read about progressive muscle relaxation and other calming strategies in relevant books or treatment manuals (e.g., Progressive Relaxation Training by Rodolfo Clan and Arvil Birks;  Mastery of Your Anxiety and Worry: Workbook by Rodney Clamp). Objective Verbalize an understanding of the role that cognitive biases play in excessive irrational worry and persistent anxiety symptoms. Target Date: 2023-11-21 Frequency: Biweekly  Progress: 0 Modality: individual  Related Interventions Discuss examples demonstrating that unrealistic worry typically overestimates the probability of threats and underestimates or overlooks the client's ability to manage realistic demands (or assign "Past Successful Anxiety Coping" in the Adult Psychotherapy Homework Planner by [facility]). Assist the client in analyzing his/her worries by examining potential biases such as the probability of the negative expectation occurring, the real consequences of it occurring, his/her ability to control the outcome, the worst possible outcome, and his/her ability to accept it (see "Analyze the Probability of a Feared Event" in the Adult Psychotherapy Homework Planner  by Beacher Bottoms; Cognitive Therapy of Anxiety Disorders by Anderson Kaufman). Objective Learn to accept limitations in life and commit to tolerating, rather than avoiding, unpleasant emotions while accomplishing meaningful goals. Target Date: 2023-11-21 Frequency: Biweekly  Progress: 0 Modality: individual  Related Interventions Use techniques from Acceptance and Commitment Therapy to help client accept uncomfortable realities such as lack of complete control, imperfections, and uncertainty and tolerate unpleasant emotions and thoughts in order to accomplish value-consistent goals. Objective Reestablish a consistent sleep-wake cycle. Target Date: 2023-11-21 Frequency: Biweekly  Progress: 0 Modality: individual  Related Interventions Teach and implement sleep hygiene practices to help the client reestablish a consistent sleep-wake cycle; review, reinforce success, and provide corrective feedback toward improvement.  Diagnosis:Adjustment disorder with  mixed anxiety and depressed mood  Plan:  -meet again on Thursday, September 04, 2023 at [facility] in-person

## 2023-08-27 ENCOUNTER — Other Ambulatory Visit: Payer: Self-pay | Admitting: Medical-Surgical

## 2023-09-04 ENCOUNTER — Ambulatory Visit (INDEPENDENT_AMBULATORY_CARE_PROVIDER_SITE_OTHER): Admitting: Professional

## 2023-09-04 DIAGNOSIS — F4323 Adjustment disorder with mixed anxiety and depressed mood: Secondary | ICD-10-CM | POA: Diagnosis not present

## 2023-09-12 ENCOUNTER — Other Ambulatory Visit: Payer: Self-pay

## 2023-09-12 MED ORDER — LEVOTHYROXINE SODIUM 137 MCG PO TABS: 137.0000 ug | ORAL_TABLET | Freq: Every day | ORAL | 3 refills | Status: DC

## 2023-09-15 ENCOUNTER — Encounter: Payer: Self-pay | Admitting: Professional

## 2023-09-15 NOTE — Progress Notes (Signed)
 [facility] Counselor/Therapist Progress Note  Patient ID: Mariah Kemp, MRN: [id number],    Date: 09/04/2023  Time Spent: 53 minutes 905-958am  Treatment Type: Individual Therapy  Risk Assessment: Danger to Self:  No Self-injurious Behavior: No Danger to Others: No  Subjective: The patient arrived on time for her in-person appointment.  Issues addressed: 1-marital -things are going well for her and Ryan -they are planning to go to [place] to see Devaughn later this summer -pt not as concerned about how Ryan will do since their recent trip to [place] went so well 2-support for friend Madelin -pt shared detailed of her friendship with Tammy -Madelin is her age and is going blind from diabetes and is unable to drive -pt offered for her to go again this year to the beach with pt, her husband, and pt's sister -Madelin is resistant due to her limited budget -pt struggling with willingness to pay for her friend as she doesn't think she will accept -discussed how to help Tammy see that she will have the same size airbnb regardless of her coming and that she will not have to pay anything -pt admits she had not considered that there would be no space difference and this might also help her friend agree to accompany -pt likes to spend time with her friend and wants her to be there  Treatment Plan Problems Addressed  Anxiety, Family Conflict, Grief / Loss Unresolved, Type A Behavior Goals 1. Achieve an overall decrease in pressured, driven behaviors. 2. Alleviate sense of time urgency, free-floating anxiety, anger, and self-destructive behaviors. 3. Become a reconstituted/blended family unit that is functional and whose members are bonded to each other. 4. Begin a healthy grieving process around husband's Alzheimer's diagnosis. Objective Attend an Alzheimer's support group. Target Date: 2023-11-21 Frequency: Biweekly  Progress: 0 Modality: individual  Related  Interventions Ask the client to attend a grief/loss support group and report to the therapist how he/she felt about attending. Objective Verbalize resolution of feelings of guilt and regret associated with cognitively impaired spouse's condition. Target Date: 2023-11-21 Frequency: Biweekly  Progress: 0 Modality: individual  Related Interventions Assign the client to make a list of all the regrets associated with actions toward or relationship with the deceased; process the list content toward resolution of these feelings. Objective Identify and voice positives about the cognitively impaired spouse including previous positive experiences, positive characteristics, positive aspects of the relationship, and how these things may be remembered. Target Date: 2023-11-21 Frequency: Biweekly  Progress: 0 Modality: individual  Related Interventions Ask the client to discuss and/or list the positive aspects of and memories about his/her relationship with the lost loved one; reinforce the client's expression of positive memories and emotions (e.g., smiling, laughing); encourage the client to share these thoughts with supportive loved ones. Objective Express thoughts and feelings to the cognitively impaired spouse while he is still capable of comprehending Target Date: 2023-11-21 Frequency: Biweekly  Progress: 0 Modality: individual  Related Interventions Ask the client to write a letter to the lost person describing his/her fond memories and/or painful and regretful memories, and how he/she currently feels life (or assign Dear : A Letter to a Lost Loved One in the Adult Psychotherapy Homework Planner by Jenniffer); process the letter in session. Objective Reengage in activities with family, friends, coworkers, and others. Target Date: 2023-11-21 Frequency: Biweekly  Progress: 0 Modality: individual  Related Interventions Assist the client in recommitting and reengaging in the primary social positive  roles  in which he/she has functioned prior to the loss. Promote behavioral activation by assisting the client in listing activities which he/she previously enjoyed but has not engaged in since experiencing the loss and then encourage reengagement in these activities (or assign Identify and Schedule Pleasant Activities in the Adult Psychotherapy Homework Planner by Jenniffer). Objective Acknowledge dependency on husband and begin to refocus life on independent actions to meet emotional needs. Target Date: 2023-11-21 Frequency: Biweekly  Progress: 0 Modality: individual  Related Interventions Explore the feelings of anger or guilt that surround the loss, helping the client understand the sources for such feelings. Assist the client in identifying how he/she depended upon the significant other, expressing and resolving the accompanying feelings of abandonment and of being left alone. 5. Begin the process of reengaging a relationship with adult son. Objective Family members report a desire for and vision of a new sense of connectedness. Target Date: 2023-11-21 Frequency: Biweekly  Progress: 0 Modality: individual  Objective Describe the conflicts and the causes of conflicts between self and adult son. Target Date: 2023-11-21 Frequency: Biweekly  Progress: 0 Modality: individual  Related Interventions Give verbal permission for the client to have and express own feelings, thoughts, and perspectives in order to foster a sense of autonomy from family. Explore the nature of the client's family conflicts and their perceived causes. Objective Identify own as well as others' role in the family conflicts. Target Date: 2023-11-21 Frequency: Biweekly  Progress: 0 Modality: individual  Related Interventions Confront the client when he/she is not taking responsibility for his/her role in the family conflict and reinforce the client for owning responsibility for his/her contribution to the  conflict. Objective Identify ways in which the patient's interactions with son could be tempered with assertiveness. Target Date: 2023-11-21 Frequency: Biweekly  Progress: 0 Modality: individual  Related Interventions Assist the parents in identifying areas that need strengthening in their parental team, then work with them to strengthen these areas (or assign Learning to Parent as a Team in the Adult Games developer by [employer]). Objective Report an increase in resolving conflicts with her adult son by talking calmly and assertively rather than aggressively and defensively. Target Date: 2023-11-21 Frequency: Biweekly  Progress: 0 Modality: individual  Related Interventions Use role-playing, role reversal, modeling, and behavioral rehearsal to help the client develop assertive ways to resolve conflict with parents (recommend Your Perfect Right: Assertiveness and Equality in Your Life and Relationships by Darrelyn armin Sick). 6. Complete the process of accepting husband's impairment and expected loss of memory. 7. Develop an awareness of how the avoidance of grieving has affected life and begin the healing process. 8. Develop social and recreational activities as a routine part of life. 9. Enhance ability to effectively cope with the full variety of life's worries and anxieties. 10. Formulate and implement a new life attitudinal pattern that allows for a more relaxed pattern of living. Objective Learn and implement calming skills as a lifestyle change and to manage pressure situations. Target Date: 2023-11-21 Frequency: Biweekly  Progress: 0 Modality: individual  Related Interventions Assign the client to implement calming techniques in his/her daily life in general and when facing trigger situations; process the results, reinforcing success and provide corrective feedback toward improvement. Objective Verbalize a commitment to learning new approaches managing self, time,  and relationships that emphasize the values of inner and other orientation. Target Date: 2023-11-21 Frequency: Biweekly  Progress: 0 Modality: individual  Related Interventions Ask the client to commit to attempting attitude and behavior changes  to promote a healthier, less Type A lifestyle; explore with him/her what changes need to be made to become less Type A. Objective Learn and implement respectful assertive communication knowledge and skills to replace insensitive directness or verbal aggression that is controlling. Target Date: 2023-11-21 Frequency: Biweekly  Progress: 0 Modality: individual  Related Interventions Monitor, point out, and reframe the client's actions or verbalizations that reflect a self-centered or critical approach to others; practice alternatives using behavioral strategies such as modeling, role-playing, and/or role reversal. Train the client in assertive communication with emphasis on recognizing and refraining from aggressive communication (e.g., ignoring of the rights of others) to respectful, assertive communication. Objective Learn problem-solving and/or conflict resolution skills to manage interpersonal problems. Target Date: 2023-11-21 Frequency: Biweekly  Progress: 0 Modality: individual  Related Interventions Teach the client conflict resolution skills (e.g., empathy, active listening, I messages, respectful communication, assertiveness without aggression, compromise); use role- play and modeling to apply these skills to current conflicts. Teach the client problem-solving skills (e.g., define the problem specifically, brainstorm options, list the pros and cons of each option, chose and implement an option, evaluate the outcome); use modeling, role-playing, and behavior rehearsal to apply this skill to several current conflicts (or assign Plan Before Acting in the Adult Psychotherapy Homework Planner by Jenniffer). Objective Demonstrate decreased impatience  with others by talking of appreciating and understanding the good qualities in others. Target Date: 2023-11-21 Frequency: Biweekly  Progress: 0 Modality: individual  Related Interventions Assign the client to talk to an associate or child, focusing on listening to the other person and learning several good things about that person; process the experience. 11. Learn and implement coping skills that result in a reduction of anxiety and worry, and improved daily functioning. 12. Stabilize anxiety level while increasing ability to function on a daily basis. Objective Verbalize an understanding of the cognitive, physiological, and behavioral components of anxiety and its treatment. Target Date: 2023-11-21 Frequency: Biweekly  Progress: 0 Modality: individual  Related Interventions Discuss how generalized anxiety typically involves excessive worry about unrealistic threats, various bodily expressions of tension, overarousal, and hypervigilance, and avoidance of what is threatening that interact to maintain the problem (see Mastery of Your Anxiety and Worry: Therapist Guide by Venson River, and Barlow; Treating Generalized Anxiety Disorder by Rygh and Red). Discuss how treatment targets worry, anxiety symptoms, and avoidance to help the client manage worry effectively, reduce overarousal, and eliminate unnecessary avoidance. Objective Learn and implement calming skills to reduce overall anxiety and manage anxiety symptoms. Target Date: 2023-11-21 Frequency: Biweekly  Progress: 0 Modality: individual  Related Interventions Teach the client calming/relaxation skills (e.g., applied relaxation, progressive muscle relaxation, cue controlled relaxation; mindful breathing; biofeedback) and how to discriminate better between relaxation and tension; teach the client how to apply these skills to his/her daily life (e.g., New Directions in Progressive Muscle Relaxation by Thornell Collier, and  Hazlett-Stevens; Treating Generalized Anxiety Disorder by Rygh and Red). Assign the client to read about progressive muscle relaxation and other calming strategies in relevant books or treatment manuals (e.g., Progressive Relaxation Training by Thornell and Collier; Mastery of Your Anxiety and Worry: Workbook by River armin Given). Objective Verbalize an understanding of the role that cognitive biases play in excessive irrational worry and persistent anxiety symptoms. Target Date: 2023-11-21 Frequency: Biweekly  Progress: 0 Modality: individual  Related Interventions Discuss examples demonstrating that unrealistic worry typically overestimates the probability of threats and underestimates or overlooks the client's ability to manage realistic demands (or assign Past Successful Anxiety  Coping in the Adult Psychotherapy Homework Planner by Jenniffer). Assist the client in analyzing his/her worries by examining potential biases such as the probability of the negative expectation occurring, the real consequences of it occurring, his/her ability to control the outcome, the worst possible outcome, and his/her ability to accept it (see Analyze the Probability of a Feared Event in the Adult Psychotherapy Homework Planner by Jenniffer; Cognitive Therapy of Anxiety Disorders by Gretta armin Mon). Objective Learn to accept limitations in life and commit to tolerating, rather than avoiding, unpleasant emotions while accomplishing meaningful goals. Target Date: 2023-11-21 Frequency: Biweekly  Progress: 0 Modality: individual  Related Interventions Use techniques from Acceptance and Commitment Therapy to help client accept uncomfortable realities such as lack of complete control, imperfections, and uncertainty and tolerate unpleasant emotions and thoughts in order to accomplish value-consistent goals. Objective Reestablish a consistent sleep-wake cycle. Target Date: 2023-11-21 Frequency: Biweekly  Progress:  0 Modality: individual  Related Interventions Teach and implement sleep hygiene practices to help the client reestablish a consistent sleep-wake cycle; review, reinforce success, and provide corrective feedback toward improvement.  Diagnosis:Adjustment disorder with mixed anxiety and depressed mood  Plan:  -meet again on Friday, October 17, 2023 at [place] in-person

## 2023-09-18 ENCOUNTER — Encounter: Payer: Self-pay | Admitting: Medical-Surgical

## 2023-09-18 ENCOUNTER — Ambulatory Visit (INDEPENDENT_AMBULATORY_CARE_PROVIDER_SITE_OTHER): Payer: Medicare Other | Admitting: Medical-Surgical

## 2023-09-18 VITALS — BP 143/69 | HR 52 | Resp 20 | Ht 60.0 in | Wt 126.0 lb

## 2023-09-18 DIAGNOSIS — I10 Essential (primary) hypertension: Secondary | ICD-10-CM | POA: Diagnosis not present

## 2023-09-18 DIAGNOSIS — J4540 Moderate persistent asthma, uncomplicated: Secondary | ICD-10-CM

## 2023-09-18 DIAGNOSIS — R7301 Impaired fasting glucose: Secondary | ICD-10-CM | POA: Diagnosis not present

## 2023-09-18 DIAGNOSIS — J410 Simple chronic bronchitis: Secondary | ICD-10-CM

## 2023-09-18 DIAGNOSIS — B001 Herpesviral vesicular dermatitis: Secondary | ICD-10-CM

## 2023-09-18 DIAGNOSIS — E782 Mixed hyperlipidemia: Secondary | ICD-10-CM

## 2023-09-18 DIAGNOSIS — R911 Solitary pulmonary nodule: Secondary | ICD-10-CM

## 2023-09-18 DIAGNOSIS — E538 Deficiency of other specified B group vitamins: Secondary | ICD-10-CM

## 2023-09-18 DIAGNOSIS — D500 Iron deficiency anemia secondary to blood loss (chronic): Secondary | ICD-10-CM

## 2023-09-18 DIAGNOSIS — Z23 Encounter for immunization: Secondary | ICD-10-CM

## 2023-09-18 DIAGNOSIS — Z636 Dependent relative needing care at home: Secondary | ICD-10-CM

## 2023-09-18 MED ORDER — NEEDLES & SYRINGES MISC: 99 refills | Status: AC

## 2023-09-18 MED ORDER — CYANOCOBALAMIN 1000 MCG/ML IJ SOLN: 1000.0000 ug | INTRAMUSCULAR | 4 refills | Status: AC

## 2023-09-18 MED ORDER — ATORVASTATIN CALCIUM 10 MG PO TABS: 10.0000 mg | ORAL_TABLET | Freq: Every day | ORAL | 3 refills | Status: AC

## 2023-09-18 MED ORDER — ACYCLOVIR 400 MG PO TABS: 400.0000 mg | ORAL_TABLET | Freq: Three times a day (TID) | ORAL | 3 refills | Status: AC | PRN

## 2023-09-18 MED ORDER — BD LUER-LOK SYRINGE 25G X 1" 3 ML MISC: 0 refills | Status: AC

## 2023-09-18 MED ORDER — SERTRALINE HCL 50 MG PO TABS
50.0000 mg | ORAL_TABLET | Freq: Every day | ORAL | 3 refills | Status: DC
Start: 2023-09-18 — End: 2023-12-12

## 2023-09-18 MED ORDER — FLUTICASONE-SALMETEROL 115-21 MCG/ACT IN AERO
2.0000 | INHALATION_SPRAY | Freq: Two times a day (BID) | RESPIRATORY_TRACT | 11 refills | Status: DC
Start: 2023-09-18 — End: 2023-12-15

## 2023-09-18 NOTE — Progress Notes (Signed)
 Established patient visit  History, exam, impression, and plan:  1. Essential hypertension (Primary) Very pleasant 63 year old female presenting today with a history of essential hypertension.  She is currently not on any medications and reports that her blood pressure is usually very well-controlled.  Prior to arrival today, she admits to drinking an energy drink which seems to have raised her blood pressure.  Usually abstains from these type of beverages and now that she knows it raises her blood pressure, she will be avoiding the future.  Denies any concerning symptoms today.  Cardiopulmonary exam normal.  Blood pressure still elevated at the end of her appointment at 143/69.  Advised monitoring at home with a goal of 130/80 or less.  On review of her most recent blood pressures from other appointments, her blood pressures have been at goal.  Checking labs as below.  Continue to work on dietary and exercise interventions for adequate control. - CBC - CMP14+EGFR - Lipid panel  2. Moderate persistent asthma without complication 3. Simple chronic bronchitis (HCC) She does have a history of moderate persistent asthma without any complications as well as simple chronic bronchitis.  She is using Advair  2 puffs twice daily which helps to keep her symptoms very well-controlled.  She is also using an albuterol  inhaler on an as needed basis.  No recent flares or concerns.  Denies shortness of breath or dyspnea on exertion.  Symptoms appear to be well-controlled for now.  Continue Advair  and albuterol  as prescribed. - fluticasone -salmeterol (ADVAIR  HFA) 115-21 MCG/ACT inhaler; Inhale 2 puffs into the lungs 2 (two) times daily.  Dispense: 1 each; Refill: 11  4. B12 deficiency History of gastric cancer with subsequent gastrectomy.  She is now vitamin B12 deficient and using vitamin B12 injections as instructed.  She is prescribed to have vitamin B12 1000 mcg weekly however admits that she often does  this once every 2 weeks rather than every week.  She is due for recheck so ordering vitamin B12 labs today.  Refilling vitamin B12 as well as syringes and needles.  If dosing needs to be adjusted, I will reach out once results are available. - SYRINGE-NEEDLE, DISP, 3 ML (B-D 3CC LUER-LOK SYR 25GX1) 25G X 1 3 ML MISC; USE AS DIRECTED ONCE A WEEK  Dispense: 4 each; Refill: 0 - cyanocobalamin  (VITAMIN B12) 1000 MCG/ML injection; Inject 1 mL (1,000 mcg total) into the muscle once a week.  Dispense: 12 mL; Refill: 4 - Needles & Syringes MISC; 25 ga x 1  IM needle/syringe  Dispense: 6 each; Refill: prn - Vitamin B12  6. Mixed hyperlipidemia History of mixed hyperlipidemia currently treated with atorvastatin  40 mg daily.  Tolerating well without side effects.  Following a low-fat heart healthy diet.  Staying active as tolerated.  Denies any concerning symptoms today.  Plan to check labs as below.  Continue atorvastatin  as prescribed. - CMP14+EGFR - Lipid panel - atorvastatin  (LIPITOR) 10 MG tablet; Take 1 tablet (10 mg total) by mouth daily.  Dispense: 90 tablet; Refill: 3  7. Iron deficiency anemia due to chronic blood loss History of iron deficiency anemia related to chronic GI bleeding.  Since her gastrectomy, she has been doing well overall.  Due for recheck so ordering iron panel today. - Iron, TIBC and Ferritin Panel  8. 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  Notes that she is dealing as well as  possible but it is always a struggle and she gets easily frustrated.  She is taking Zoloft  50 mg daily, tolerating well without side effects.  Feels the medication is helpful although it can only do so much given the situation.  She is happy with her dosing and denies SI/HI.  Continue sertraline  50 mg daily.  If symptoms worsen or the medication does not seem to be working, we may need to consider increase to 100 mg  daily. - sertraline  (ZOLOFT ) 50 MG tablet; Take 1 tablet (50 mg total) by mouth daily.  Dispense: 90 tablet; Refill: 3  9. 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  She takes acyclovir  400 mg 3 times daily as needed for up to 5 days when a cold sore develops.  This seems to work very well for her.  The medication is well-tolerated without side effects.  No current sores present.  Continue acyclovir  as needed. - acyclovir  (ZOVIRAX ) 400 MG tablet; Take 1 tablet (400 mg total) by mouth 3 (three) times daily as needed. For five days. As needed for cold sore.  Dispense: 45 tablet; Refill: 3  10. Lung nodule seen on imaging study She does have a history of lung nodules seen on imaging.  1 nodule is approximately 3 mm while the other is 7 mm.  Continual monitoring was recommended.  Her last imaging was done in 2023.  Plan for follow-up with CT chest without contrast to get her updated. - CT Chest Wo Contrast; Future  11. Need for pneumococcal vaccine Prevnar 20 given in office today.  Procedures performed this visit: None.  Return in about 1 year (around 09/17/2024) for chronic disease follow up.  __________________________________ Mariah Kemp Palin, DNP, APRN, FNP-BC Primary Care and Sports Medicine [facility]

## 2023-09-19 LAB — CMP14+EGFR
ALT: 26 IU/L (ref 0–32)
AST: 32 IU/L (ref 0–40)
Albumin: 4.3 g/dL (ref 3.9–4.9)
Alkaline Phosphatase: 91 IU/L (ref 44–121)
BUN/Creatinine Ratio: 23 (ref 12–28)
BUN: 11 mg/dL (ref 8–27)
Bilirubin Total: <0.2 mg/dL (ref 0.0–1.2)
CO2: 22 mmol/L (ref 20–29)
Calcium: 9.3 mg/dL (ref 8.7–10.3)
Chloride: 101 mmol/L (ref 96–106)
Creatinine, Ser: 0.47 mg/dL — ABNORMAL LOW (ref 0.57–1.00)
Globulin, Total: 2.7 g/dL (ref 1.5–4.5)
Glucose: 80 mg/dL (ref 70–99)
Potassium: 4.3 mmol/L (ref 3.5–5.2)
Sodium: 139 mmol/L (ref 134–144)
Total Protein: 7 g/dL (ref 6.0–8.5)
eGFR: 108 mL/min/1.73 (ref >59)

## 2023-09-19 LAB — CBC
Hematocrit: 35.9 % (ref 34.0–46.6)
Hemoglobin: 11.7 g/dL (ref 11.1–15.9)
MCH: 31.5 pg (ref 26.6–33.0)
MCHC: 32.6 g/dL (ref 31.5–35.7)
MCV: 97 fL (ref 79–97)
Platelets: 175 x10E3/uL (ref 150–450)
RBC: 3.71 x10E6/uL — ABNORMAL LOW (ref 3.77–5.28)
RDW: 13 % (ref 11.7–15.4)
WBC: 7.6 x10E3/uL (ref 3.4–10.8)

## 2023-09-19 LAB — LIPID PANEL
Chol/HDL Ratio: 6 ratio — ABNORMAL HIGH (ref 0.0–4.4)
Cholesterol, Total: 259 mg/dL — ABNORMAL HIGH (ref 100–199)
HDL: 43 mg/dL (ref >39)
LDL Chol Calc (NIH): 179 mg/dL — ABNORMAL HIGH (ref 0–99)
Triglycerides: 198 mg/dL — ABNORMAL HIGH (ref 0–149)
VLDL Cholesterol Cal: 37 mg/dL (ref 5–40)

## 2023-09-19 LAB — IRON,TIBC AND FERRITIN PANEL
Ferritin: 873 ng/mL — ABNORMAL HIGH (ref 15–150)
Iron Saturation: 26 % (ref 15–55)
Iron: 81 ug/dL (ref 27–139)
Total Iron Binding Capacity: 315 ug/dL (ref 250–450)
UIBC: 234 ug/dL (ref 118–369)

## 2023-09-19 LAB — VITAMIN B12: Vitamin B-12: 675 pg/mL (ref 232–1245)

## 2023-09-23 ENCOUNTER — Telehealth: Payer: Self-pay

## 2023-09-23 ENCOUNTER — Encounter: Payer: Self-pay | Admitting: Hematology

## 2023-09-23 ENCOUNTER — Other Ambulatory Visit (HOSPITAL_COMMUNITY): Payer: Self-pay

## 2023-09-23 NOTE — Telephone Encounter (Signed)
 Pharmacy Patient Advocate Encounter  Received notification from [employer] that Prior Authorization for Advair  Hfa Aer 115/21 has been DENIED.  Full denial letter will be uploaded to the media tab. See denial reason below.   PA #/Case ID/Reference #: [id number]

## 2023-09-23 NOTE — Telephone Encounter (Signed)
 Pharmacy Patient Advocate Encounter   Received notification from CoverMyMeds that prior authorization for Advair  HFA 115-21MCG/ACT aerosol is required/requested.   Insurance verification completed.   The patient is insured through [facility] .   Per test claim: PA required; PA submitted to above mentioned insurance via CoverMyMeds Key/confirmation #/EOC [id number] Status is pending

## 2023-09-26 ENCOUNTER — Ambulatory Visit: Admitting: Professional

## 2023-09-29 ENCOUNTER — Ambulatory Visit

## 2023-09-29 DIAGNOSIS — R911 Solitary pulmonary nodule: Secondary | ICD-10-CM | POA: Diagnosis not present

## 2023-09-30 ENCOUNTER — Telehealth: Payer: Self-pay

## 2023-09-30 ENCOUNTER — Ambulatory Visit: Payer: Self-pay | Admitting: Medical-Surgical

## 2023-09-30 DIAGNOSIS — R911 Solitary pulmonary nodule: Secondary | ICD-10-CM

## 2023-09-30 NOTE — Telephone Encounter (Signed)
 Copied from CRM [id number]. Topic: General - Other >> Sep 30, 2023  9:04 AM Kevelyn M wrote: Reason for CRM: Tiffany/call back #[phone]. Letting us  know that chest contrast image results are in starting yesterday.

## 2023-10-17 ENCOUNTER — Ambulatory Visit: Admitting: Professional

## 2023-10-17 ENCOUNTER — Encounter: Payer: Self-pay | Admitting: Professional

## 2023-10-17 DIAGNOSIS — F4323 Adjustment disorder with mixed anxiety and depressed mood: Secondary | ICD-10-CM | POA: Diagnosis not present

## 2023-10-17 NOTE — Progress Notes (Signed)
 [facility] Counselor/Therapist Progress Note  Patient ID: Mariah Oppedisano, MRN: [id number],    Date: 10/17/2023  Time Spent: 53 minutes [id number]  Treatment Type: Individual Therapy  Risk Assessment: Danger to Self:  No Self-injurious Behavior: No Danger to Others: No  Subjective: The patient arrived on time for her in-person appointment.  Issues addressed: 1-marital -the couple is doing well and she sees him back to [place] -she reports their vacation to [place] was terrific with no difficulties -Ryan was asymptomatic and a pleasure to spend time with -she feels encouraged that he is more like the husband she has known 2-support for friend Madelin -pt got her friend Tammy to agree to go to [employer] -pt told her to be ready that they were picking her up -Tammy reluctantly agreed to allow the pt/spouse to bring her despite her limited financial resources 3-physical health -pt has had a return of cancer with a nodule on her lung that has grown significantly since last evaluated -pt reports she requested an MRI because she felt it had been too long since the medical team recommended -pt has only told her sister and this clinician -pt is unsure if she wants to go through chemo and radiation however, feels she has no choice since she cares for Ryan -pt reminded that she is getting ahead of herself because the results have not yet been read and a treatment plan discussed -pt also reminded that she doesn't control her time table and that someone would care for Ryan whether it was an expected or unexpected situation -pt admits she knows her daughter would care for Ryan but she hates to put it on her -discussed strategies for managing her feelings related to the potential for a negative outcome  Treatment Plan Problems Addressed  Anxiety, Family Conflict, Grief / Loss Unresolved, Type A Behavior Goals 1. Achieve an overall decrease in pressured, driven  behaviors. 2. Alleviate sense of time urgency, free-floating anxiety, anger, and self-destructive behaviors. 3. Become a reconstituted/blended family unit that is functional and whose members are bonded to each other. 4. Begin a healthy grieving process around husband's Alzheimer's diagnosis. Objective Attend an Alzheimer's support group. Target Date: 2023-11-21 Frequency: Biweekly  Progress: 0 Modality: individual  Related Interventions Ask the client to attend a grief/loss support group and report to the therapist how he/she felt about attending. Objective Verbalize resolution of feelings of guilt and regret associated with cognitively impaired spouse's condition. Target Date: 2023-11-21 Frequency: Biweekly  Progress: 0 Modality: individual  Related Interventions Assign the client to make a list of all the regrets associated with actions toward or relationship with the deceased; process the list content toward resolution of these feelings. Objective Identify and voice positives about the cognitively impaired spouse including previous positive experiences, positive characteristics, positive aspects of the relationship, and how these things may be remembered. Target Date: 2023-11-21 Frequency: Biweekly  Progress: 0 Modality: individual  Related Interventions Ask the client to discuss and/or list the positive aspects of and memories about his/her relationship with the lost loved one; reinforce the client's expression of positive memories and emotions (e.g., smiling, laughing); encourage the client to share these thoughts with supportive loved ones. Objective Express thoughts and feelings to the cognitively impaired spouse while he is still capable of comprehending Target Date: 2023-11-21 Frequency: Biweekly  Progress: 0 Modality: individual  Related Interventions Ask the client to write a letter to the lost person describing his/her fond memories and/or painful and regretful memories, and  how  he/she currently feels life (or assign Dear : A Letter to a Lost Loved One in the Adult Psychotherapy Homework Planner by [facility]); process the letter in session. Objective Reengage in activities with family, friends, coworkers, and others. Target Date: 2023-11-21 Frequency: Biweekly  Progress: 0 Modality: individual  Related Interventions Assist the client in recommitting and reengaging in the primary social positive roles in which he/she has functioned prior to the loss. Promote behavioral activation by assisting the client in listing activities which he/she previously enjoyed but has not engaged in since experiencing the loss and then encourage reengagement in these activities (or assign Identify and Schedule Pleasant Activities in the Adult Psychotherapy Homework Planner by Jenniffer). Objective Acknowledge dependency on husband and begin to refocus life on independent actions to meet emotional needs. Target Date: 2023-11-21 Frequency: Biweekly  Progress: 0 Modality: individual  Related Interventions Explore the feelings of anger or guilt that surround the loss, helping the client understand the sources for such feelings. Assist the client in identifying how he/she depended upon the significant other, expressing and resolving the accompanying feelings of abandonment and of being left alone. 5. Begin the process of reengaging a relationship with adult son. Objective Family members report a desire for and vision of a new sense of connectedness. Target Date: 2023-11-21 Frequency: Biweekly  Progress: 0 Modality: individual  Objective Describe the conflicts and the causes of conflicts between self and adult son. Target Date: 2023-11-21 Frequency: Biweekly  Progress: 0 Modality: individual  Related Interventions Give verbal permission for the client to have and express own feelings, thoughts, and perspectives in order to foster a sense of autonomy from family. Explore the nature of the  client's family conflicts and their perceived causes. Objective Identify own as well as others' role in the family conflicts. Target Date: 2023-11-21 Frequency: Biweekly  Progress: 0 Modality: individual  Related Interventions Confront the client when he/she is not taking responsibility for his/her role in the family conflict and reinforce the client for owning responsibility for his/her contribution to the conflict. Objective Identify ways in which the patient's interactions with son could be tempered with assertiveness. Target Date: 2023-11-21 Frequency: Biweekly  Progress: 0 Modality: individual  Related Interventions Assist the parents in identifying areas that need strengthening in their parental team, then work with them to strengthen these areas (or assign Learning to Parent as a Team in the Adult Games developer by [employer]). Objective Report an increase in resolving conflicts with her adult son by talking calmly and assertively rather than aggressively and defensively. Target Date: 2023-11-21 Frequency: Biweekly  Progress: 0 Modality: individual  Related Interventions Use role-playing, role reversal, modeling, and behavioral rehearsal to help the client develop assertive ways to resolve conflict with parents (recommend Your Perfect Right: Assertiveness and Equality in Your Life and Relationships by Darrelyn armin Sick). 6. Complete the process of accepting husband's impairment and expected loss of memory. 7. Develop an awareness of how the avoidance of grieving has affected life and begin the healing process. 8. Develop social and recreational activities as a routine part of life. 9. Enhance ability to effectively cope with the full variety of life's worries and anxieties. 10. Formulate and implement a new life attitudinal pattern that allows for a more relaxed pattern of living. Objective Learn and implement calming skills as a lifestyle change and to manage  pressure situations. Target Date: 2023-11-21 Frequency: Biweekly  Progress: 0 Modality: individual  Related Interventions Assign the client to implement calming techniques in his/her daily life  in general and when facing trigger situations; process the results, reinforcing success and provide corrective feedback toward improvement. Objective Verbalize a commitment to learning new approaches managing self, time, and relationships that emphasize the values of inner and other orientation. Target Date: 2023-11-21 Frequency: Biweekly  Progress: 0 Modality: individual  Related Interventions Ask the client to commit to attempting attitude and behavior changes to promote a healthier, less Type A lifestyle; explore with him/her what changes need to be made to become less Type A. Objective Learn and implement respectful assertive communication knowledge and skills to replace insensitive directness or verbal aggression that is controlling. Target Date: 2023-11-21 Frequency: Biweekly  Progress: 0 Modality: individual  Related Interventions Monitor, point out, and reframe the client's actions or verbalizations that reflect a self-centered or critical approach to others; practice alternatives using behavioral strategies such as modeling, role-playing, and/or role reversal. Train the client in assertive communication with emphasis on recognizing and refraining from aggressive communication (e.g., ignoring of the rights of others) to respectful, assertive communication. Objective Learn problem-solving and/or conflict resolution skills to manage interpersonal problems. Target Date: 2023-11-21 Frequency: Biweekly  Progress: 0 Modality: individual  Related Interventions Teach the client conflict resolution skills (e.g., empathy, active listening, I messages, respectful communication, assertiveness without aggression, compromise); use role- play and modeling to apply these skills to current conflicts. Teach  the client problem-solving skills (e.g., define the problem specifically, brainstorm options, list the pros and cons of each option, chose and implement an option, evaluate the outcome); use modeling, role-playing, and behavior rehearsal to apply this skill to several current conflicts (or assign Plan Before Acting in the Adult Psychotherapy Homework Planner by Jenniffer). Objective Demonstrate decreased impatience with others by talking of appreciating and understanding the good qualities in others. Target Date: 2023-11-21 Frequency: Biweekly  Progress: 0 Modality: individual  Related Interventions Assign the client to talk to an associate or child, focusing on listening to the other person and learning several good things about that person; process the experience. 11. Learn and implement coping skills that result in a reduction of anxiety and worry, and improved daily functioning. 12. Stabilize anxiety level while increasing ability to function on a daily basis. Objective Verbalize an understanding of the cognitive, physiological, and behavioral components of anxiety and its treatment. Target Date: 2023-11-21 Frequency: Biweekly  Progress: 0 Modality: individual  Related Interventions Discuss how generalized anxiety typically involves excessive worry about unrealistic threats, various bodily expressions of tension, overarousal, and hypervigilance, and avoidance of what is threatening that interact to maintain the problem (see Mastery of Your Anxiety and Worry: Therapist Guide by [place], and Barlow; Treating Generalized Anxiety Disorder by Rygh and Red). Discuss how treatment targets worry, anxiety symptoms, and avoidance to help the client manage worry effectively, reduce overarousal, and eliminate unnecessary avoidance. Objective Learn and implement calming skills to reduce overall anxiety and manage anxiety symptoms. Target Date: 2023-11-21 Frequency: Biweekly  Progress: 0  Modality: individual  Related Interventions Teach the client calming/relaxation skills (e.g., applied relaxation, progressive muscle relaxation, cue controlled relaxation; mindful breathing; biofeedback) and how to discriminate better between relaxation and tension; teach the client how to apply these skills to his/her daily life (e.g., New Directions in Progressive Muscle Relaxation by Thornell Collier, and Hazlett-Stevens; Treating Generalized Anxiety Disorder by Rygh and Red). Assign the client to read about progressive muscle relaxation and other calming strategies in relevant books or treatment manuals (e.g., Progressive Relaxation Training by Thornell and Collier; Mastery of Your Anxiety and Worry: Workbook  by Richarda armin Given). Objective Verbalize an understanding of the role that cognitive biases play in excessive irrational worry and persistent anxiety symptoms. Target Date: 2023-11-21 Frequency: Biweekly  Progress: 0 Modality: individual  Related Interventions Discuss examples demonstrating that unrealistic worry typically overestimates the probability of threats and underestimates or overlooks the client's ability to manage realistic demands (or assign Past Successful Anxiety Coping in the Adult Psychotherapy Homework Planner by Jenniffer). Assist the client in analyzing his/her worries by examining potential biases such as the probability of the negative expectation occurring, the real consequences of it occurring, his/her ability to control the outcome, the worst possible outcome, and his/her ability to accept it (see Analyze the Probability of a Feared Event in the Adult Psychotherapy Homework Planner by Jenniffer; Cognitive Therapy of Anxiety Disorders by Gretta armin Mon). Objective Learn to accept limitations in life and commit to tolerating, rather than avoiding, unpleasant emotions while accomplishing meaningful goals. Target Date: 2023-11-21 Frequency: Biweekly  Progress:  0 Modality: individual  Related Interventions Use techniques from Acceptance and Commitment Therapy to help client accept uncomfortable realities such as lack of complete control, imperfections, and uncertainty and tolerate unpleasant emotions and thoughts in order to accomplish value-consistent goals. Objective Reestablish a consistent sleep-wake cycle. Target Date: 2023-11-21 Frequency: Biweekly  Progress: 0 Modality: individual  Related Interventions Teach and implement sleep hygiene practices to help the client reestablish a consistent sleep-wake cycle; review, reinforce success, and provide corrective feedback toward improvement.  Diagnosis:Adjustment disorder with mixed anxiety and depressed mood  Plan:  -meet again on Friday, December 05, 2023 at 11am in-person

## 2023-10-21 ENCOUNTER — Ambulatory Visit: Payer: Medicare Other | Admitting: Internal Medicine

## 2023-10-23 ENCOUNTER — Telehealth: Payer: Self-pay | Admitting: Emergency Medicine

## 2023-10-23 ENCOUNTER — Ambulatory Visit (INDEPENDENT_AMBULATORY_CARE_PROVIDER_SITE_OTHER): Admitting: Emergency Medicine

## 2023-10-23 ENCOUNTER — Encounter: Payer: Self-pay | Admitting: Emergency Medicine

## 2023-10-23 VITALS — BP 121/72 | HR 60 | Temp 97.6°F | Ht 60.0 in | Wt 126.0 lb

## 2023-10-23 DIAGNOSIS — J4540 Moderate persistent asthma, uncomplicated: Secondary | ICD-10-CM

## 2023-10-23 DIAGNOSIS — R918 Other nonspecific abnormal finding of lung field: Secondary | ICD-10-CM | POA: Diagnosis not present

## 2023-10-23 NOTE — Patient Instructions (Signed)
 VISIT SUMMARY:  Today, you were seen for an evaluation of your lung nodules. We discussed your history of stage 4A gastric cancer and stage 3 right breast cancer, as well as your asthma and other medical conditions. We reviewed your recent imaging results and planned further steps for diagnosis and management.  YOUR PLAN:  -RIGHT UPPER LOBE PULMONARY NODULE: A pulmonary nodule is a small growth in the lung. Your recent scan showed that the nodule in your right upper lung has increased to 13 mm, which could indicate a slow-growing cancer. We need to determine if this is a new primary lung cancer or a metastasis from your previous cancers. We will perform a bronchoscopy on November 10, 2023, to take a biopsy of the nodule. Additionally, we will conduct pulmonary function tests to assess your lung function before any potential surgery. You will need supervision after the procedure due to the anesthesia.  -ASTHMA: Asthma is a condition where your airways narrow and swell, causing breathing difficulties. Your asthma is well-controlled with your current medications, Advair  and albuterol . Continue using these medications as needed, and we will monitor your symptoms to ensure the medications remain effective.  INSTRUCTIONS:  Please follow up with the scheduled bronchoscopy on November 10, 2023, for a biopsy of your lung nodule. Ensure you have someone to accompany you for post-procedure supervision due to the anesthesia. Additionally, complete the pulmonary function tests as ordered. Continue using your asthma medications as prescribed and monitor your symptoms. If you notice any changes or have concerns, please contact our office.

## 2023-10-23 NOTE — Telephone Encounter (Signed)
   Please schedule the following:  Provider performing procedure: Armenta Erskin Diagnosis: Right upper lobe nodule Which side if for nodule / mass?  Right Procedure: Robotic assisted navigational bronchoscopy Has patient been spoken to by Provider and given informed consent?  Yes Anesthesia: General Do you need Fluro?  Yes Duration of procedure: 30 minutes Date: 11/10/2023 Alternate Date:   Time: Any Location: [facility] Does patient have OSA?  No DM?  No or Latex allergy?  No Medication Restriction/ Anticoagulate/Antiplatelet: None Pre-op Labs Ordered:determined by Anesthesia Imaging request: CT available in PACS (If, SuperDimension CT Chest, please have STAT courier sent to ENDO)

## 2023-10-23 NOTE — H&P (View-Only) (Signed)
 Subjective:    Patient ID: Mariah Kemp, female    DOB: [birth year], 63 y.o.   MRN: [id number]  HPI  Mariah Kemp is a 63 year old female with hx of stage 4A gastric cancer and stage 3 right breast cancer who presents for evaluation of pulmonary nodules. She was referred by her primary care doctor for further evaluation of her lung nodules.  She has a history of stage 4A gastric cancer diagnosed in 2020, treated with neoadjuvant therapy, total gastrectomy, and lymph node dissection. Additionally, she has stage 3 right breast cancer diagnosed in 2007, treated with mastectomy, chemotherapy, and tamoxifen. Pulmonary nodules have been monitored through serial surveillance imaging. The most recent scan from July 2025 was reported to show a mixed density right upper lobe pulmonary nodule with a more prominent solid component measuring 13 millimeters. No new nodules were identified and the other nodules remain stable.  She has a history of asthma diagnosed many years ago. She uses an inhaler approximately once a month, which provides immediate relief, and also uses Advair , though she is uncertain of its effectiveness. No frequent breathing difficulties and good exercise tolerance.  Her past medical history includes hypertension, B12 deficiency, and hypothyroidism. She is a former smoker, having smoked a pack a day from age 59 until quitting in 31. 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 She has lived in [place]  and her current location, with no significant environmental exposures reported.  In the review of symptoms, she is a mouth breather rather than a nose breather. No recent significant breathing issues, and her asthma is well-controlled with infrequent inhaler use.   RADIOLOGY Chest CT: No mediastinal or hilar adenopathy. Lungs well aerated. Scattered pulmonary nodules stable on serial exams. Right upper lobe apical mixed density  nodule with more prominent solid component at 13 mm. No new nodules. (09/29/2023)  Review of Systems As per HPI  Past Medical History:  Diagnosis Date   Anemia    Asthma    Blood transfusion without reported diagnosis    Breast cancer (HCC)    Dyspnea    over exertion   Gastrointestinal hemorrhage    Hypertension    Hypothyroidism    Stomach cancer (HCC)      Family History  Problem Relation Age of Onset   Liver cancer Father    Cancer Paternal Uncle        unknown type cancer   Cirrhosis Mother    Colon cancer Neg Hx    Esophageal cancer Neg Hx    Rectal cancer Neg Hx    Stomach cancer Neg Hx      Social History   Socioeconomic History   Marital status: Married    Spouse name: Ailea Rhatigan   Number of children: 2   Years of education: 11   Highest education level: GED or equivalent  Occupational History   Occupation: Chartered certified accountant    Comment: Disabled  Tobacco Use   Smoking status: Former    Current packs/day: 0.00    Average packs/day: 0.5 packs/day for 15.0 years (7.5 ttl pk-yrs)    Types: Cigarettes    Start date: 04/28/1979    Quit date: 04/27/1994    Years since quitting: 29.5   Smokeless tobacco: Never  Vaping Use   Vaping status: Never Used  Substance and Sexual Activity   Alcohol use: No    Alcohol/week: 0.0 standard drinks of alcohol  Drug use: No   Sexual activity: Never    Partners: Male  Other Topics Concern   Not on file  Social History Narrative   Lives with her husband. She enjoys reading and watching television.   Social Drivers of Corporate investment banker Strain: Low Risk  (09/16/2022)   Overall Financial Resource Strain (CARDIA)    Difficulty of Paying Living Expenses: Not hard at all  Food Insecurity: No Food Insecurity (09/16/2022)   Hunger Vital Sign    Worried About Running Out of Food in the Last Year: Never true    Ran Out of Food in the Last Year: Never true  Transportation Needs: No Transportation Needs (09/16/2022)    PRAPARE - Administrator, Civil Service (Medical): No    Lack of Transportation (Non-Medical): No  Physical Activity: Unknown (09/16/2022)   Exercise Vital Sign    Days of Exercise per Week: Patient declined    Minutes of Exercise per Session: Not on file  Stress: Stress Concern Present (09/16/2022)   [employer] of Occupational Health - Occupational Stress Questionnaire    Feeling of Stress : Rather much  Social Connections: Moderately Isolated (09/16/2022)   Social Connection and Isolation Panel    Frequency of Communication with Friends and Family: More than three times a week    Frequency of Social Gatherings with Friends and Family: More than three times a week    Attends Religious Services: Never    Database administrator or Organizations: No    Attends Banker Meetings: Not on file    Marital Status: Married  Catering manager Violence: Not At Risk (08/28/2021)   Humiliation, Afraid, Rape, and Kick questionnaire    Fear of Current or Ex-Partner: No    Emotionally Abused: No    Physically Abused: No    Sexually Abused: No    - Tobacco: Former smoker (quit in 1996), 1.0 ppd x 25 years (25 pack-years) - 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 For five days. As needed for cold sore. 45 tablet 3   albuterol  (VENTOLIN  HFA) 108 (90 Base) MCG/ACT inhaler inhale 1-2 puffs into the lungs every 4 hours if needed for wheezing or shortness of breath. 8.5 g 1   atorvastatin  (LIPITOR) 10 MG tablet Take 1 tablet (10 mg total) by mouth daily. 90 tablet 3   calcium  carbonate (TUMS - DOSED IN MG ELEMENTAL CALCIUM ) 500 MG chewable tablet Chew 1 tablet (200 mg of elemental calcium  total) by mouth 3 (three) times  daily. 90 tablet 11   clobetasol ointment (TEMOVATE) 0.05 % Apply 1 Application topically 2 (two) times daily.     cyanocobalamin  (VITAMIN B12) 1000 MCG/ML injection Inject 1 mL (1,000 mcg total) into the muscle once a week. 12 mL 4   estradiol (ESTRACE) 0.1 MG/GM vaginal cream Place 1 Applicatorful vaginally 2 (two) times daily.     fluticasone -salmeterol (ADVAIR  HFA) 115-21 MCG/ACT inhaler Inhale 2 puffs into the lungs 2 (two) times daily. 1 each 11   levothyroxine  (SYNTHROID ) 137 MCG tablet Take 1 tablet (137 mcg total) by mouth daily before breakfast. 30 tablet 3   liothyronine  (CYTOMEL ) 5 MCG tablet Take 1 tablet (5 mcg total)  by mouth in the morning and at bedtime. 180 tablet 3   loperamide  (IMODIUM ) 2 MG capsule Take 1 capsule (2 mg total) by mouth 2 (two) times daily as needed for diarrhea or loose stools. 30 capsule 0   methocarbamol  (ROBAXIN ) 500 MG tablet Take 500 mg by mouth every 8 (eight) hours as needed.     Needles & Syringes MISC 25 ga x 1  IM needle/syringe 6 each prn   omeprazole  (PRILOSEC) 20 MG capsule Open capsule and sprinkle on food once daily 90 capsule 0   sertraline  (ZOLOFT ) 50 MG tablet Take 1 tablet (50 mg total) by mouth daily. 90 tablet 3   SYRINGE-NEEDLE, DISP, 3 ML (B-D 3CC LUER-LOK SYR 25GX1) 25G X 1 3 ML MISC USE AS DIRECTED ONCE A WEEK 4 each 0   Facility-Administered Medications Prior to Visit  Medication Dose Route Frequency Provider Last Rate Last Admin   0.9 %  sodium chloride  infusion  500 mL Intravenous Continuous Legrand Victory LITTIE DOUGLAS, MD            Objective:   Physical Exam  Vitals:   10/23/23 1259  BP: 121/72  Pulse: 60  Temp: 97.6 F (36.4 C)  TempSrc: Temporal  SpO2: 99%  Weight: 126 lb (57.2 kg)  Height: 5' (1.524 m)    Gen: Pleasant, well-nourished, in no distress,  normal affect  ENT: No lesions,  mouth clear,  oropharynx clear, no postnasal drip  Neck: No JVD, no stridor  Lungs: No use of accessory muscles, no crackles or  wheezing on normal respiration, no wheeze on forced expiration  Cardiovascular: RRR, heart sounds normal, no murmur or gallops, no peripheral edema  Musculoskeletal: No deformities, no cyanosis or clubbing  Neuro: alert, awake, non focal  Skin: Warm, no lesions or rash      Assessment & Plan:  Pulmonary nodules Right upper lobe pulmonary nodule with possible increased solid component Nodule increased to 13 mm, possible slow-growing malignancy. Differential includes primary lung cancer versus metastasis from gastric or breast cancer history. - Schedule bronchoscopy for November 10, 2023, for biopsy. - Order pulmonary function tests for pre-op surgical assessment in the event that TCTS referral is needed. - Arrange post-procedure supervision due to anesthesia.    Asthma Asthma Well-controlled with infrequent inhaler use. Good exercise tolerance. - Continue Advair  and albuterol  as needed. - Monitor symptoms and reassess medication efficacy if needed.   Lamar Chris, MD, PhD 10/23/2023, 5:06 PM [facility] [phone] or if no answer before 7:00PM call [phone] For any issues after 7:00PM please call eLink [phone]

## 2023-10-23 NOTE — Telephone Encounter (Signed)
 Letter given by [facility] case # [phone] will send to [facility] to get auth

## 2023-10-23 NOTE — Progress Notes (Signed)
 Subjective:    Patient ID: Mariah Kemp, female    DOB: [birth year], 63 y.o.   MRN: [id number]  HPI  Mariah Kemp is a 63 year old female with hx of stage 4A gastric cancer and stage 3 right breast cancer who presents for evaluation of pulmonary nodules. She was referred by her primary care doctor for further evaluation of her lung nodules.  She has a history of stage 4A gastric cancer diagnosed in 2020, treated with neoadjuvant therapy, total gastrectomy, and lymph node dissection. Additionally, she has stage 3 right breast cancer diagnosed in 2007, treated with mastectomy, chemotherapy, and tamoxifen. Pulmonary nodules have been monitored through serial surveillance imaging. The most recent scan from July 2025 was reported to show a mixed density right upper lobe pulmonary nodule with a more prominent solid component measuring 13 millimeters. No new nodules were identified and the other nodules remain stable.  She has a history of asthma diagnosed many years ago. She uses an inhaler approximately once a month, which provides immediate relief, and also uses Advair , though she is uncertain of its effectiveness. No frequent breathing difficulties and good exercise tolerance.  Her past medical history includes hypertension, B12 deficiency, and hypothyroidism. She is a former smoker, having smoked a pack a day from age 59 until quitting in 31. 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 She has lived in [place]  and her current location, with no significant environmental exposures reported.  In the review of symptoms, she is a mouth breather rather than a nose breather. No recent significant breathing issues, and her asthma is well-controlled with infrequent inhaler use.   RADIOLOGY Chest CT: No mediastinal or hilar adenopathy. Lungs well aerated. Scattered pulmonary nodules stable on serial exams. Right upper lobe apical mixed density  nodule with more prominent solid component at 13 mm. No new nodules. (09/29/2023)  Review of Systems As per HPI  Past Medical History:  Diagnosis Date   Anemia    Asthma    Blood transfusion without reported diagnosis    Breast cancer (HCC)    Dyspnea    over exertion   Gastrointestinal hemorrhage    Hypertension    Hypothyroidism    Stomach cancer (HCC)      Family History  Problem Relation Age of Onset   Liver cancer Father    Cancer Paternal Uncle        unknown type cancer   Cirrhosis Mother    Colon cancer Neg Hx    Esophageal cancer Neg Hx    Rectal cancer Neg Hx    Stomach cancer Neg Hx      Social History   Socioeconomic History   Marital status: Married    Spouse name: Ailea Rhatigan   Number of children: 2   Years of education: 11   Highest education level: GED or equivalent  Occupational History   Occupation: Chartered certified accountant    Comment: Disabled  Tobacco Use   Smoking status: Former    Current packs/day: 0.00    Average packs/day: 0.5 packs/day for 15.0 years (7.5 ttl pk-yrs)    Types: Cigarettes    Start date: 04/28/1979    Quit date: 04/27/1994    Years since quitting: 29.5   Smokeless tobacco: Never  Vaping Use   Vaping status: Never Used  Substance and Sexual Activity   Alcohol use: No    Alcohol/week: 0.0 standard drinks of alcohol  Drug use: No   Sexual activity: Never    Partners: Male  Other Topics Concern   Not on file  Social History Narrative   Lives with her husband. She enjoys reading and watching television.   Social Drivers of Corporate investment banker Strain: Low Risk  (09/16/2022)   Overall Financial Resource Strain (CARDIA)    Difficulty of Paying Living Expenses: Not hard at all  Food Insecurity: No Food Insecurity (09/16/2022)   Hunger Vital Sign    Worried About Running Out of Food in the Last Year: Never true    Ran Out of Food in the Last Year: Never true  Transportation Needs: No Transportation Needs (09/16/2022)    PRAPARE - Administrator, Civil Service (Medical): No    Lack of Transportation (Non-Medical): No  Physical Activity: Unknown (09/16/2022)   Exercise Vital Sign    Days of Exercise per Week: Patient declined    Minutes of Exercise per Session: Not on file  Stress: Stress Concern Present (09/16/2022)   [employer] of Occupational Health - Occupational Stress Questionnaire    Feeling of Stress : Rather much  Social Connections: Moderately Isolated (09/16/2022)   Social Connection and Isolation Panel    Frequency of Communication with Friends and Family: More than three times a week    Frequency of Social Gatherings with Friends and Family: More than three times a week    Attends Religious Services: Never    Database administrator or Organizations: No    Attends Banker Meetings: Not on file    Marital Status: Married  Catering manager Violence: Not At Risk (08/28/2021)   Humiliation, Afraid, Rape, and Kick questionnaire    Fear of Current or Ex-Partner: No    Emotionally Abused: No    Physically Abused: No    Sexually Abused: No    - Tobacco: Former smoker (quit in 1996), 1.0 ppd x 25 years (25 pack-years) - 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 For five days. As needed for cold sore. 45 tablet 3   albuterol  (VENTOLIN  HFA) 108 (90 Base) MCG/ACT inhaler inhale 1-2 puffs into the lungs every 4 hours if needed for wheezing or shortness of breath. 8.5 g 1   atorvastatin  (LIPITOR) 10 MG tablet Take 1 tablet (10 mg total) by mouth daily. 90 tablet 3   calcium  carbonate (TUMS - DOSED IN MG ELEMENTAL CALCIUM ) 500 MG chewable tablet Chew 1 tablet (200 mg of elemental calcium  total) by mouth 3 (three) times  daily. 90 tablet 11   clobetasol ointment (TEMOVATE) 0.05 % Apply 1 Application topically 2 (two) times daily.     cyanocobalamin  (VITAMIN B12) 1000 MCG/ML injection Inject 1 mL (1,000 mcg total) into the muscle once a week. 12 mL 4   estradiol (ESTRACE) 0.1 MG/GM vaginal cream Place 1 Applicatorful vaginally 2 (two) times daily.     fluticasone -salmeterol (ADVAIR  HFA) 115-21 MCG/ACT inhaler Inhale 2 puffs into the lungs 2 (two) times daily. 1 each 11   levothyroxine  (SYNTHROID ) 137 MCG tablet Take 1 tablet (137 mcg total) by mouth daily before breakfast. 30 tablet 3   liothyronine  (CYTOMEL ) 5 MCG tablet Take 1 tablet (5 mcg total)  by mouth in the morning and at bedtime. 180 tablet 3   loperamide  (IMODIUM ) 2 MG capsule Take 1 capsule (2 mg total) by mouth 2 (two) times daily as needed for diarrhea or loose stools. 30 capsule 0   methocarbamol  (ROBAXIN ) 500 MG tablet Take 500 mg by mouth every 8 (eight) hours as needed.     Needles & Syringes MISC 25 ga x 1  IM needle/syringe 6 each prn   omeprazole  (PRILOSEC) 20 MG capsule Open capsule and sprinkle on food once daily 90 capsule 0   sertraline  (ZOLOFT ) 50 MG tablet Take 1 tablet (50 mg total) by mouth daily. 90 tablet 3   SYRINGE-NEEDLE, DISP, 3 ML (B-D 3CC LUER-LOK SYR 25GX1) 25G X 1 3 ML MISC USE AS DIRECTED ONCE A WEEK 4 each 0   Facility-Administered Medications Prior to Visit  Medication Dose Route Frequency Provider Last Rate Last Admin   0.9 %  sodium chloride  infusion  500 mL Intravenous Continuous Legrand Victory LITTIE DOUGLAS, MD            Objective:   Physical Exam  Vitals:   10/23/23 1259  BP: 121/72  Pulse: 60  Temp: 97.6 F (36.4 C)  TempSrc: Temporal  SpO2: 99%  Weight: 126 lb (57.2 kg)  Height: 5' (1.524 m)    Gen: Pleasant, well-nourished, in no distress,  normal affect  ENT: No lesions,  mouth clear,  oropharynx clear, no postnasal drip  Neck: No JVD, no stridor  Lungs: No use of accessory muscles, no crackles or  wheezing on normal respiration, no wheeze on forced expiration  Cardiovascular: RRR, heart sounds normal, no murmur or gallops, no peripheral edema  Musculoskeletal: No deformities, no cyanosis or clubbing  Neuro: alert, awake, non focal  Skin: Warm, no lesions or rash      Assessment & Plan:  Pulmonary nodules Right upper lobe pulmonary nodule with possible increased solid component Nodule increased to 13 mm, possible slow-growing malignancy. Differential includes primary lung cancer versus metastasis from gastric or breast cancer history. - Schedule bronchoscopy for November 10, 2023, for biopsy. - Order pulmonary function tests for pre-op surgical assessment in the event that TCTS referral is needed. - Arrange post-procedure supervision due to anesthesia.    Asthma Asthma Well-controlled with infrequent inhaler use. Good exercise tolerance. - Continue Advair  and albuterol  as needed. - Monitor symptoms and reassess medication efficacy if needed.   Lamar Chris, MD, PhD 10/23/2023, 5:06 PM [facility] [phone] or if no answer before 7:00PM call [phone] For any issues after 7:00PM please call eLink [phone]

## 2023-10-23 NOTE — Assessment & Plan Note (Signed)
 Asthma Well-controlled with infrequent inhaler use. Good exercise tolerance. - Continue Advair  and albuterol  as needed. - Monitor symptoms and reassess medication efficacy if needed.

## 2023-10-23 NOTE — Assessment & Plan Note (Signed)
 Right upper lobe pulmonary nodule with possible increased solid component Nodule increased to 13 mm, possible slow-growing malignancy. Differential includes primary lung cancer versus metastasis from gastric or breast cancer history. - Schedule bronchoscopy for November 10, 2023, for biopsy. - Order pulmonary function tests for pre-op surgical assessment in the event that TCTS referral is needed. - Arrange post-procedure supervision due to anesthesia.

## 2023-10-24 ENCOUNTER — Encounter: Payer: Self-pay | Admitting: Internal Medicine

## 2023-10-24 ENCOUNTER — Ambulatory Visit (INDEPENDENT_AMBULATORY_CARE_PROVIDER_SITE_OTHER): Admitting: Internal Medicine

## 2023-10-24 VITALS — BP 120/70 | HR 72 | Ht 60.0 in | Wt 125.4 lb

## 2023-10-24 DIAGNOSIS — E039 Hypothyroidism, unspecified: Secondary | ICD-10-CM

## 2023-10-24 NOTE — Progress Notes (Signed)
 Name: Mariah Kemp  MRN/ DOB: [id number], 1961-01-09    Age/ Sex: 63 y.o., female     PCP: Willo Mini, NP   Reason for Endocrinology Evaluation: Hypothyroidism     Initial Endocrinology Clinic Visit: 07/19/2020    PATIENT IDENTIFIER: Mariah Kemp is a 63 y.o., female with a past medical history of Hx of breast Ca ( S/P right mastectomy ) , Hx of gastric cancer HTn and hypothyroidism. She has followed with [facility] since 07/19/2020 for consultative assistance with management of her Hypothyroidism.   HISTORICAL SUMMARY:  She has been diagnosed with gastric cancer in 2020. S/P chemo from 1-07/2018 followed by total gastrectomy 09/2018.  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      She has been diagnosed with Hypothyroidism ~ 30 yrs ago . She has been on LT-4 for years.    Started liothyronine  11/2021 due to variable absorption of levothyroxine   No FH of thyroid  disease  SUBJECTIVE:    Today (10/24/2023):  Mariah Kemp is here for hypothyroidism.  Patient follows with pulmonary for pulmonary nodules, scheduled for bronchoscopy August, 25, 2025. Has mild asthma  She was seen by Dr. Lanny (oncology) for follow-up on hx of gastric carcinoma Weight continues to fluctuate No recent cough  Energy level is good  She had a recent dx osteoporosis, she is seeing a specialist in [place]  No local neck swelling  No palpitations  No tremors  No constipation or diarrhea    She is on  Biotin, on hold  She is on MVI   Levothyroxine  137 mcg ,1 tab daily  Liothyronine  5 mcg BID   HISTORY:  Past Medical History:  Past Medical History:  Diagnosis Date   Anemia    Asthma    Blood transfusion without reported diagnosis    Breast cancer (HCC)    Dyspnea    over exertion   Gastrointestinal hemorrhage    Hypertension    Hypothyroidism    Stomach cancer [facility])    Past Surgical History:  Past Surgical History:  Procedure Laterality Date   ABDOMINAL  HYSTERECTOMY  1996   BARIATRIC SURGERY     stomach removal   BILATERAL SALPINGOOPHORECTOMY  2007   BIOPSY  03/29/2018   Procedure: BIOPSY;  Surgeon: Dianna Specking, MD;  Location: [facility] ENDOSCOPY;  Service: Endoscopy;;   CARPAL TUNNEL RELEASE     COLONOSCOPY N/A 03/29/2018   Procedure: COLONOSCOPY;  Surgeon: Dianna Specking, MD;  Location: [facility] ENDOSCOPY;  Service: Endoscopy;  Laterality: N/A;   COLONOSCOPY     ESOPHAGOGASTRODUODENOSCOPY N/A 03/29/2018   Procedure: ESOPHAGOGASTRODUODENOSCOPY (EGD);  Surgeon: Dianna Specking, MD;  Location: [facility] ENDOSCOPY;  Service: Endoscopy;  Laterality: N/A;   GASTRECTOMY N/A 10/02/2018   Procedure: TOTAL GASTRECTOMY;  Surgeon: Aron Shoulders, MD;  Location: [facility] OR;  Service: General;  Laterality: N/A;   GASTROSTOMY N/A 10/02/2018   Procedure: FEEDING TUBE PLACEMENT;  Surgeon: Aron Shoulders, MD;  Location: [facility] OR;  Service: General;  Laterality: N/A;   LAPAROSCOPY N/A 04/13/2018   Procedure: LAPAROSCOPY DIAGNOSTIC;  Surgeon: Aron Shoulders, MD;  Location: [facility] ORS;  Service: General;  Laterality: N/A;   LAPAROSCOPY N/A 10/02/2018   Procedure: LAPAROSCOPY DIAGNOSTIC;  Surgeon: Aron Shoulders, MD;  Location: [facility] OR;  Service: General;  Laterality: N/A;   MASTECTOMY Right 2007   PORT-A-CATH REMOVAL Left 11/07/2020   Procedure: PORT REMOVAL;  Surgeon: Aron Shoulders, MD;  Location: [facility] OR;  Service: General;  Laterality: Left;   PORTACATH PLACEMENT Left 04/13/2018  Procedure: INSERTION PORT-A-CATH ERAS PATHWAY;  Surgeon: Aron Shoulders, MD;  Location: [facility] ORS;  Service: General;  Laterality: Left;  subclavian   UPPER GASTROINTESTINAL ENDOSCOPY     Social History:  reports that she quit smoking about 29 years ago. Her smoking use included cigarettes. She started smoking about 44 years ago. She has a 7.5 pack-year smoking history. She has never used smokeless tobacco. She reports that she does not drink alcohol and does not use drugs. Family History:  Family History   Problem Relation Age of Onset   Liver cancer Father    Cancer Paternal Uncle        unknown type cancer   Cirrhosis Mother    Colon cancer Neg Hx    Esophageal cancer Neg Hx    Rectal cancer Neg Hx    Stomach cancer Neg Hx      HOME MEDICATIONS: Allergies as of 10/24/2023   No Known Allergies      Medication List        Accurate as of October 24, 2023  8:57 AM. If you have any questions, ask your nurse or doctor.          acyclovir  400 MG tablet Commonly known as: Zovirax  Take 1 tablet (400 mg total) by mouth 3 (three) times daily as needed. For five days. As needed for cold sore.   albuterol  108 (90 Base) MCG/ACT inhaler Commonly known as: VENTOLIN  HFA inhale 1-2 puffs into the lungs every 4 hours if needed for wheezing or shortness of breath.   atorvastatin  10 MG tablet Commonly known as: LIPITOR Take 1 tablet (10 mg total) by mouth daily.   B-D 3CC LUER-LOK SYR 25GX1 25G X 1 3 ML Misc Generic drug: SYRINGE-NEEDLE (DISP) 3 ML USE AS DIRECTED ONCE A WEEK   calcium  carbonate 500 MG chewable tablet Commonly known as: TUMS - dosed in mg elemental calcium  Chew 1 tablet (200 mg of elemental calcium  total) by mouth 3 (three) times daily.   clobetasol ointment 0.05 % Commonly known as: TEMOVATE Apply 1 Application topically 2 (two) times daily.   cyanocobalamin  1000 MCG/ML injection Commonly known as: VITAMIN B12 Inject 1 mL (1,000 mcg total) into the muscle once a week.   estradiol 0.1 MG/GM vaginal cream Commonly known as: ESTRACE Place 1 Applicatorful vaginally 2 (two) times daily.   fluticasone -salmeterol 115-21 MCG/ACT inhaler Commonly known as: Advair  HFA Inhale 2 puffs into the lungs 2 (two) times daily.   levothyroxine  137 MCG tablet Commonly known as: SYNTHROID  Take 1 tablet (137 mcg total) by mouth daily before breakfast.   liothyronine  5 MCG tablet Commonly known as: CYTOMEL  Take 1 tablet (5 mcg total) by mouth in the morning and at  bedtime.   loperamide  2 MG capsule Commonly known as: IMODIUM  Take 1 capsule (2 mg total) by mouth 2 (two) times daily as needed for diarrhea or loose stools.   methocarbamol  500 MG tablet Commonly known as: ROBAXIN  Take 500 mg by mouth every 8 (eight) hours as needed.   Needles & Syringes Misc 25 ga x 1  IM needle/syringe   omeprazole  20 MG capsule Commonly known as: PRILOSEC Open capsule and sprinkle on food once daily   sertraline  50 MG tablet Commonly known as: ZOLOFT  Take 1 tablet (50 mg total) by mouth daily.          OBJECTIVE:   PHYSICAL EXAM: VS: BP 120/70 (BP Location: Left Arm, Patient Position: Sitting, Cuff Size: Normal)   Pulse 72  Ht 5' (1.524 m)   Wt 125 lb 6.4 oz (56.9 kg)   SpO2 97%   BMI 24.49 kg/m     EXAM: General: Pt appears well and is in NAD  Neck:  Thyroid : Thyroid  size normal.  No goiter or nodules appreciated.  Lungs: Clear with good BS bilat s  Heart: Auscultation: RRR.  Extremities:  BL LE: No pretibial edema   Mental Status: Judgment, insight: Intact Memory: Intact for recent and remote events Mood and affect: No depression, anxiety, or agitation     DATA REVIEWED:   Latest Reference Range & Units 10/24/23 09:16  TSH 0.40 - 4.50 mIU/L 8.36 (H)  (H): Data is abnormally high  ASSESSMENT / PLAN / RECOMMENDATIONS:   Hypothyroidism:  -Patient is clinically euthyroid - No local neck symptoms  -Patient with variable LT-4 absorption , hence fluctuating TFTs - TSH is trending down, but continues to be above goal, will increase levothyroxine  as below and recheck in 2 months  Medications   Stop levothyroxine  137 mcg daily  Start levothyroxine  150 mcg daily Continue liothyronine  5 mcg BID   Follow-up in 6 months Labs in 2 months      Signed electronically by: Stefano Redgie Butts, MD  [facility] Endocrinology  [facility] [address] [place] [place], [place] [address] Phone:  [phone] FAX: [phone]      CC: Willo Mini, NP [address] [place] [address] Phone: [phone]  Fax: [phone]   Return to Endocrinology clinic as below: Future Appointments  Date Time Provider Department Center  11/18/2023  1:30 PM Ruthell Lauraine FALCON, NP [facility] None  12/05/2023 11:00 AM [facility], [facility] None

## 2023-10-25 LAB — TSH: TSH: 8.36 m[IU]/L — ABNORMAL HIGH (ref 0.40–4.50)

## 2023-10-27 ENCOUNTER — Ambulatory Visit: Payer: Self-pay | Admitting: Internal Medicine

## 2023-10-27 ENCOUNTER — Telehealth: Payer: Self-pay | Admitting: Internal Medicine

## 2023-10-27 MED ORDER — LEVOTHYROXINE SODIUM 150 MCG PO TABS: 150.0000 ug | ORAL_TABLET | Freq: Every day | ORAL | 3 refills | Status: DC

## 2023-10-27 NOTE — Telephone Encounter (Signed)
 Patient has been scheduled

## 2023-10-27 NOTE — Telephone Encounter (Signed)
 Please contact the patient and schedule her for a lab appointment in 2 months    A portal message was sent to her  Thank you

## 2023-11-06 ENCOUNTER — Other Ambulatory Visit: Payer: Self-pay

## 2023-11-06 ENCOUNTER — Encounter (HOSPITAL_COMMUNITY): Payer: Self-pay | Admitting: Emergency Medicine

## 2023-11-06 NOTE — Progress Notes (Signed)
 PCP - Willo Mini, NP  Cardiologist -   PPM/ICD - denies Device Orders - n/a Rep Notified - n/a  Chest x-ray - Chest CT 09-29-23 EKG - DOS Stress Test - denies ECHO - denies Cardiac Cath - denies  CPAP - denies  DM -denies  Blood Thinner Instructions: denies Aspirin Instructions: denies  ERAS Protcol - NPO  COVID TEST- n/a  Anesthesia review: No  Patient verbally denies any shortness of breath, fever, cough and chest pain during phone call   -------------  SDW INSTRUCTIONS given:  Your procedure is scheduled on November 10, 2023.  Report to [facility] Main Entrance A at 10:15 A.M., and check in at the Admitting office.  Call this number if you have problems the morning of surgery:  [phone]   Remember:  Do not eat or drink after midnight the night before your surgery     Take these medicines the morning of surgery with A SIP OF WATER  acyclovir  (ZOVIRAX )  albuterol  (VENTOLIN  HFA)  atorvastatin  (LIPITOR)  fluticasone -salmeterol (ADVAIR  HFA)  levothyroxine  (SYNTHROID )  liothyronine  (CYTOMEL )  methocarbamol  (ROBAXIN )  omeprazole  (PRILOSEC)  sertraline  (ZOLOFT )   As of today, STOP taking any Aspirin (unless otherwise instructed by your surgeon) Aleve, Naproxen, Ibuprofen, Motrin, Advil, Goody's, BC's, all herbal medications, fish oil, and all vitamins.                      Do not wear jewelry, make up, or nail polish            Do not wear lotions, powders, perfumes/colognes, or deodorant.            Do not shave 48 hours prior to surgery.  Men may shave face and neck.            Do not bring valuables to the hospital.            [facility] is not responsible for any belongings or valuables.  Do NOT Smoke (Tobacco/Vaping) 24 hours prior to your procedure If you use a CPAP at night, you may bring all equipment for your overnight stay.   Contacts, glasses, dentures or bridgework may not be worn into surgery.      For patients admitted to the hospital,  discharge time will be determined by your treatment team.   Patients discharged the day of surgery will not be allowed to drive home, and someone needs to stay with them for 24 hours.    Special instructions:   [facility]- Preparing For Surgery  Before surgery, you can play an important role. Because skin is not sterile, your skin needs to be as free of germs as possible. You can reduce the number of germs on your skin by washing with CHG (chlorahexidine gluconate) Soap before surgery.  CHG is an antiseptic cleaner which kills germs and bonds with the skin to continue killing germs even after washing.    Oral Hygiene is also important to reduce your risk of infection.  Remember - BRUSH YOUR TEETH THE MORNING OF SURGERY WITH YOUR REGULAR TOOTHPASTE  Please do not use if you have an allergy to CHG or antibacterial soaps. If your skin becomes reddened/irritated stop using the CHG.  Do not shave (including legs and underarms) for at least 48 hours prior to first CHG shower. It is OK to shave your face.  Please follow these instructions carefully.   Shower the NIGHT BEFORE SURGERY and the MORNING OF SURGERY with DIAL Soap.  Pat yourself dry with a CLEAN TOWEL.  Wear CLEAN PAJAMAS to bed the night before surgery  Place CLEAN SHEETS on your bed the night of your first shower and DO NOT SLEEP WITH PETS.   Day of Surgery: Please shower morning of surgery  Wear Clean/Comfortable clothing the morning of surgery Do not apply any deodorants/lotions.   Remember to brush your teeth WITH YOUR REGULAR TOOTHPASTE.   Questions were answered. Patient verbalized understanding of instructions.

## 2023-11-10 ENCOUNTER — Ambulatory Visit (HOSPITAL_COMMUNITY)

## 2023-11-10 ENCOUNTER — Encounter (HOSPITAL_COMMUNITY): Payer: Self-pay | Admitting: Emergency Medicine

## 2023-11-10 ENCOUNTER — Other Ambulatory Visit: Payer: Self-pay

## 2023-11-10 ENCOUNTER — Ambulatory Visit (HOSPITAL_COMMUNITY): Admitting: Anesthesiology

## 2023-11-10 ENCOUNTER — Encounter (HOSPITAL_COMMUNITY)
Admission: RE | Disposition: A | Discharge status: Home / Self Care | Payer: Self-pay | Source: Home / Self Care | Attending: Emergency Medicine

## 2023-11-10 ENCOUNTER — Ambulatory Visit (HOSPITAL_COMMUNITY)
Admission: RE | Admit: 2023-11-10 | Discharge: 2023-11-10 | Disposition: A | Discharge status: Home / Self Care | Attending: Emergency Medicine | Admitting: Emergency Medicine

## 2023-11-10 DIAGNOSIS — R911 Solitary pulmonary nodule: Secondary | ICD-10-CM | POA: Diagnosis not present

## 2023-11-10 DIAGNOSIS — R918 Other nonspecific abnormal finding of lung field: Secondary | ICD-10-CM | POA: Diagnosis present

## 2023-11-10 DIAGNOSIS — I10 Essential (primary) hypertension: Secondary | ICD-10-CM | POA: Insufficient documentation

## 2023-11-10 DIAGNOSIS — K219 Gastro-esophageal reflux disease without esophagitis: Secondary | ICD-10-CM | POA: Insufficient documentation

## 2023-11-10 DIAGNOSIS — Z87891 Personal history of nicotine dependence: Secondary | ICD-10-CM

## 2023-11-10 DIAGNOSIS — Z903 Acquired absence of stomach [part of]: Secondary | ICD-10-CM | POA: Insufficient documentation

## 2023-11-10 DIAGNOSIS — Z79899 Other long term (current) drug therapy: Secondary | ICD-10-CM | POA: Diagnosis not present

## 2023-11-10 DIAGNOSIS — Z85028 Personal history of other malignant neoplasm of stomach: Secondary | ICD-10-CM | POA: Diagnosis not present

## 2023-11-10 DIAGNOSIS — J45909 Unspecified asthma, uncomplicated: Secondary | ICD-10-CM | POA: Diagnosis not present

## 2023-11-10 DIAGNOSIS — Z9221 Personal history of antineoplastic chemotherapy: Secondary | ICD-10-CM | POA: Insufficient documentation

## 2023-11-10 DIAGNOSIS — E785 Hyperlipidemia, unspecified: Secondary | ICD-10-CM | POA: Insufficient documentation

## 2023-11-10 DIAGNOSIS — E039 Hypothyroidism, unspecified: Secondary | ICD-10-CM

## 2023-11-10 DIAGNOSIS — Z79890 Hormone replacement therapy (postmenopausal): Secondary | ICD-10-CM | POA: Diagnosis not present

## 2023-11-10 DIAGNOSIS — Z853 Personal history of malignant neoplasm of breast: Secondary | ICD-10-CM | POA: Diagnosis not present

## 2023-11-10 DIAGNOSIS — Z9011 Acquired absence of right breast and nipple: Secondary | ICD-10-CM | POA: Insufficient documentation

## 2023-11-10 HISTORY — PX: VIDEO BRONCHOSCOPY WITH ENDOBRONCHIAL NAVIGATION: SHX6175

## 2023-11-10 HISTORY — DX: Depression, unspecified: F32.A

## 2023-11-10 LAB — CBC
HCT: 33.3 % — ABNORMAL LOW (ref 36.0–46.0)
Hemoglobin: 11.1 g/dL — ABNORMAL LOW (ref 12.0–15.0)
MCH: 32.3 pg (ref 26.0–34.0)
MCHC: 33.3 g/dL (ref 30.0–36.0)
MCV: 96.8 fL (ref 80.0–100.0)
Platelets: 161 K/uL (ref 150–400)
RBC: 3.44 MIL/uL — ABNORMAL LOW (ref 3.87–5.11)
RDW: 13.1 % (ref 11.5–15.5)
WBC: 5.2 K/uL (ref 4.0–10.5)
nRBC: 0 % (ref 0.0–0.2)

## 2023-11-10 LAB — BASIC METABOLIC PANEL WITH GFR
Anion gap: 9 (ref 5–15)
BUN: 8 mg/dL (ref 8–23)
CO2: 25 mmol/L (ref 22–32)
Calcium: 9.2 mg/dL (ref 8.9–10.3)
Chloride: 106 mmol/L (ref 98–111)
Creatinine, Ser: 0.48 mg/dL (ref 0.44–1.00)
GFR, Estimated: >60 mL/min (ref >60)
Glucose, Bld: 87 mg/dL (ref 70–99)
Potassium: 3.5 mmol/L (ref 3.5–5.1)
Sodium: 140 mmol/L (ref 135–145)

## 2023-11-10 SURGERY — VIDEO BRONCHOSCOPY WITH ENDOBRONCHIAL NAVIGATION
Anesthesia: General | Laterality: Right

## 2023-11-10 MED ORDER — LACTATED RINGERS IV SOLN: INTRAVENOUS | Status: DC

## 2023-11-10 MED ORDER — OXYCODONE HCL 5 MG/5ML PO SOLN: 5.0000 mg | Freq: Once | ORAL | Status: DC | PRN

## 2023-11-10 MED ORDER — SODIUM CHLORIDE 0.9 % IV SOLN: INTRAVENOUS | Status: DC | PRN

## 2023-11-10 MED ORDER — PHENYLEPHRINE 80 MCG/ML (10ML) SYRINGE FOR IV PUSH (FOR BLOOD PRESSURE SUPPORT)
PREFILLED_SYRINGE | INTRAVENOUS | Status: DC | PRN
  Administered 2023-11-10 (×3): 80 ug via INTRAVENOUS

## 2023-11-10 MED ORDER — PROPOFOL 10 MG/ML IV BOLUS
INTRAVENOUS | Status: DC | PRN
  Administered 2023-11-10: 130 mg via INTRAVENOUS
  Administered 2023-11-10: 30 mg via INTRAVENOUS

## 2023-11-10 MED ORDER — ONDANSETRON HCL 4 MG/2ML IJ SOLN: 4.0000 mg | Freq: Once | INTRAMUSCULAR | Status: DC | PRN

## 2023-11-10 MED ORDER — CHLORHEXIDINE GLUCONATE 0.12 % MT SOLN
OROMUCOSAL | Status: AC
  Administered 2023-11-10: 15 mL
  Filled 2023-11-10: qty 15

## 2023-11-10 MED ORDER — ESMOLOL HCL 100 MG/10ML IV SOLN
INTRAVENOUS | Status: DC | PRN
  Administered 2023-11-10 (×2): 30 mg via INTRAVENOUS
  Administered 2023-11-10: 20 mg via INTRAVENOUS

## 2023-11-10 MED ORDER — SUGAMMADEX SODIUM 200 MG/2ML IV SOLN
INTRAVENOUS | Status: DC | PRN
  Administered 2023-11-10: 200 mg via INTRAVENOUS

## 2023-11-10 MED ORDER — FENTANYL CITRATE (PF) 100 MCG/2ML IJ SOLN: 25.0000 ug | INTRAMUSCULAR | Status: DC | PRN

## 2023-11-10 MED ORDER — ONDANSETRON HCL 4 MG/2ML IJ SOLN
INTRAMUSCULAR | Status: DC | PRN
  Administered 2023-11-10: 4 mg via INTRAVENOUS

## 2023-11-10 MED ORDER — DROPERIDOL 2.5 MG/ML IJ SOLN: 0.6250 mg | Freq: Once | INTRAMUSCULAR | Status: DC | PRN

## 2023-11-10 MED ORDER — ROCURONIUM BROMIDE 10 MG/ML (PF) SYRINGE
PREFILLED_SYRINGE | INTRAVENOUS | Status: DC | PRN
  Administered 2023-11-10: 10 mg via INTRAVENOUS
  Administered 2023-11-10: 50 mg via INTRAVENOUS

## 2023-11-10 MED ORDER — LIDOCAINE 2% (20 MG/ML) 5 ML SYRINGE
INTRAMUSCULAR | Status: DC | PRN
  Administered 2023-11-10: 60 mg via INTRAVENOUS

## 2023-11-10 MED ORDER — DEXAMETHASONE SODIUM PHOSPHATE 10 MG/ML IJ SOLN
INTRAMUSCULAR | Status: DC | PRN
  Administered 2023-11-10: 5 mg via INTRAVENOUS

## 2023-11-10 MED ORDER — OXYCODONE HCL 5 MG PO TABS: 5.0000 mg | ORAL_TABLET | Freq: Once | ORAL | Status: DC | PRN

## 2023-11-10 SURGICAL SUPPLY — 37 items
ADAPTER BRONCHOSCOPE OLYMPUS (ADAPTER) ×1 IMPLANT
ADAPTER VALVE BIOPSY EBUS (MISCELLANEOUS) IMPLANT
BAG COUNTER SPONGE SURGICOUNT (BAG) ×1 IMPLANT
BRUSH CYTOL CELLEBRITY 1.5X140 (MISCELLANEOUS) ×1 IMPLANT
BRUSH SUPERTRAX BIOPSY (INSTRUMENTS) IMPLANT
BRUSH SUPERTRAX NDL-TIP CYTO (INSTRUMENTS) ×1 IMPLANT
CANISTER SUCTION 3000ML PPV (SUCTIONS) ×1 IMPLANT
CNTNR URN SCR LID CUP LEK RST (MISCELLANEOUS) ×1 IMPLANT
COVER BACK TABLE 60X90IN (DRAPES) ×1 IMPLANT
FILTER STRAW FLUID ASPIR (MISCELLANEOUS) IMPLANT
FORCEPS BIOP 1.5 SINGLE USE (MISCELLANEOUS) ×1 IMPLANT
FORCEPS BIOP SUPERTRX PREMAR (INSTRUMENTS) ×1 IMPLANT
GAUZE SPONGE 4X4 12PLY STRL (GAUZE/BANDAGES/DRESSINGS) ×1 IMPLANT
GLOVE BIO SURGEON STRL SZ7.5 (GLOVE) ×2 IMPLANT
GOWN STRL REUS W/ TWL LRG LVL3 (GOWN DISPOSABLE) ×2 IMPLANT
KIT CLEAN ENDO COMPLIANCE (KITS) ×1 IMPLANT
KIT LOCATABLE GUIDE (CANNULA) IMPLANT
KIT MARKER FIDUCIAL DELIVERY (KITS) IMPLANT
KIT TURNOVER KIT B (KITS) ×1 IMPLANT
MARKER SKIN DUAL TIP RULER LAB (MISCELLANEOUS) ×1 IMPLANT
NDL SUPERTRX PREMARK BIOPSY (NEEDLE) ×1 IMPLANT
NEEDLE SUPERTRX PREMARK BIOPSY (NEEDLE) ×1 IMPLANT
NS IRRIG 1000ML POUR BTL (IV SOLUTION) ×1 IMPLANT
OIL SILICONE PENTAX (PARTS (SERVICE/REPAIRS)) ×1 IMPLANT
PAD ARMBOARD POSITIONER FOAM (MISCELLANEOUS) ×2 IMPLANT
PATCHES PATIENT (LABEL) ×3 IMPLANT
SYR 20ML ECCENTRIC (SYRINGE) ×1 IMPLANT
SYR 20ML LL LF (SYRINGE) ×1 IMPLANT
SYR 50ML SLIP (SYRINGE) ×1 IMPLANT
TOWEL GREEN STERILE FF (TOWEL DISPOSABLE) ×1 IMPLANT
TRAP SPECIMEN MUCUS 40CC (MISCELLANEOUS) IMPLANT
TUBE CONNECTING 20X1/4 (TUBING) ×1 IMPLANT
UNDERPAD 30X36 HEAVY ABSORB (UNDERPADS AND DIAPERS) ×1 IMPLANT
VALVE BIOPSY SINGLE USE (MISCELLANEOUS) ×1 IMPLANT
VALVE SUCTION BRONCHIO DISP (MISCELLANEOUS) ×1 IMPLANT
WATER STERILE IRR 1000ML POUR (IV SOLUTION) ×1 IMPLANT
superlock fiducial marker IMPLANT

## 2023-11-10 NOTE — Discharge Instructions (Addendum)

## 2023-11-10 NOTE — Anesthesia Preprocedure Evaluation (Signed)
 Anesthesia Evaluation  Patient identified by MRN, date of birth, ID band Patient awake    Reviewed: Allergy & Precautions, NPO status , Patient's Chart, lab work & pertinent test results  Airway Mallampati: II  TM Distance: >3 FB     Dental no notable dental hx. (+) Teeth Intact, Dental Advisory Given   Pulmonary shortness of breath and with exertion, asthma , former smoker RUL pulmonary nodule   Pulmonary exam normal breath sounds clear to auscultation       Cardiovascular hypertension, Pt. on medications Normal cardiovascular exam Rhythm:Regular Rate:Normal     Neuro/Psych  PSYCHIATRIC DISORDERS  Depression    negative neurological ROS     GI/Hepatic Neg liver ROS,GERD  Medicated,,Hx/o gastric Ca S/P total gastrectomy feeding tube placement   Endo/Other  Hypothyroidism  Hyperlipidemia Hx/o Breast Ca S/P mastectomy ChemoRx  Renal/GU negative Renal ROS  negative genitourinary   Musculoskeletal negative musculoskeletal ROS (+)    Abdominal   Peds  Hematology  (+) Blood dyscrasia, anemia   Anesthesia Other Findings   Reproductive/Obstetrics                              Anesthesia Physical Anesthesia Plan  ASA: 3  Anesthesia Plan: General   Post-op Pain Management: Minimal or no pain anticipated   Induction: Intravenous  PONV Risk Score and Plan: 4 or greater and Treatment may vary due to age or medical condition, Propofol  infusion, TIVA and Ondansetron   Airway Management Planned: Oral ETT  Additional Equipment: None  Intra-op Plan:   Post-operative Plan: Extubation in OR  Informed Consent: I have reviewed the patients History and Physical, chart, labs and discussed the procedure including the risks, benefits and alternatives for the proposed anesthesia with the patient or authorized representative who has indicated his/her understanding and acceptance.     Dental advisory  given  Plan Discussed with: CRNA and Anesthesiologist  Anesthesia Plan Comments:          Anesthesia Quick Evaluation

## 2023-11-10 NOTE — Interval H&P Note (Signed)
 History and Physical Interval Note:  11/10/2023 10:57 AM  Mariah Kemp  has presented today for surgery, with the diagnosis of Right upper lobe nodule.  The various methods of treatment have been discussed with the patient and family. After consideration of risks, benefits and other options for treatment, the patient has consented to  Procedure(s): VIDEO BRONCHOSCOPY WITH ENDOBRONCHIAL NAVIGATION (Right) as a surgical intervention.  The patient's history has been reviewed, patient examined, no change in status, stable for surgery.  I have reviewed the patient's chart and labs.  Questions were answered to the patient's satisfaction.     Lamar GORMAN Chris

## 2023-11-10 NOTE — Op Note (Signed)
 Video Bronchoscopy with Robotic Assisted Bronchoscopic Navigation   Date of Operation: 11/10/2023   Pre-op Diagnosis: Bilateral pulmonary nodules  Post-op Diagnosis: Same  Surgeon: Lamar Chris  Assistants: Thom Chill  Anesthesia: General endotracheal anesthesia  Operation: Flexible video fiberoptic bronchoscopy with robotic assistance and biopsies.  Estimated Blood Loss: Minimal  Complications: None  Indications and History: Mariah Kemp is a 63 y.o. female with history of stage IV gastric cancer, stage III right breast cancer.  She has been treated and has been followed with serial imaging for pulmonary nodules.  Most of these have been stable but the largest right upper lobe pulmonary nodule has become more solid over time.  Recommendation made to achieve a tissue diagnosis via robotic assisted navigational bronchoscopy.  The risks, benefits, complications, treatment options and expected outcomes were discussed with the patient.  The possibilities of pneumothorax, pneumonia, reaction to medication, pulmonary aspiration, perforation of a viscus, bleeding, failure to diagnose a condition and creating a complication requiring transfusion or operation were discussed with the patient who freely signed the consent.    Description of Procedure: The patient was seen in the Preoperative Area, was examined and was deemed appropriate to proceed.  The patient was taken to [facility] Endoscopy room 3, identified as Mariah Kemp and the procedure verified as Flexible Video Fiberoptic Bronchoscopy.  A Time Out was held and the above information confirmed.   Prior to the date of the procedure a high-resolution CT scan of the chest was performed. Utilizing ION software program a virtual tracheobronchial tree was generated to allow the creation of distinct navigation pathways to the patient's parenchymal abnormalities. After being taken to the operating room general anesthesia was initiated and the  patient  was orally intubated. The video fiberoptic bronchoscope was introduced via the endotracheal tube and a general inspection was performed which showed normal right and left lung anatomy. Aspiration of the bilateral mainstems was completed to remove any remaining secretions. Robotic catheter inserted into patient's endotracheal tube.   Target #1 right upper lobe pulmonary nodule (largest): The distinct navigation pathways prepared prior to this procedure were then utilized to navigate to patient's lesion identified on CT scan. The robotic catheter was secured into place and the vision probe was withdrawn.  Lesion location was approximated using fluoroscopy.  Local registration and targeting was performed using [employer] Cios mobile C-arm three-dimensional imaging. Under fluoroscopic guidance transbronchial needle biopsies and transbronchial cryoprobe biopsies were performed to be sent for cytology and pathology.  Needle-in-lesion was confirmed using Cios mobile C-arm.  Under fluoroscopic guidance a single fiducial marker was placed adjacent to the nodule.  Target #2 right upper lobe nodule (smaller, adjacent): The distinct navigation pathways prepared prior to this procedure were then utilized to navigate to patient's lesion identified on CT scan. The robotic catheter was secured into place and the vision probe was withdrawn.  Lesion location was approximated using fluoroscopy.  Local registration and targeting was performed using [employer] Cios mobile C-arm three-dimensional imaging. Under fluoroscopic guidance transbronchial needle biopsies and transbronchial cryoprobe biopsies were performed to be sent for cytology and pathology.  Needle-in-lesion was confirmed using Cios mobile C-arm.  A bronchioalveolar lavage was performed in the right upper lobe and sent for microbiology.  Under fluoroscopic guidance a single fiducial marker was placed adjacent to the nodule.  At the end of  the procedure a general airway inspection was performed and there was no evidence of active bleeding. The bronchoscope was removed.  The patient  tolerated the procedure well. There was no significant blood loss and there were no obvious complications. A post-procedural chest x-ray is pending.  Samples Target #1: 1. Transbronchial Wang needle biopsies from right upper lobe nodule (larger) 2. Transbronchial cryoprobe biopsies from right upper lobe nodule  Samples Target #2: 1. Transbronchial Wang needle biopsies from right upper lobe nodule (smaller, adjacent) 2. Transbronchial cryoprobe biopsies from right upper lobe nodule 3. Bronchoalveolar lavage from right upper lobe  Plans:  The patient will be discharged from the PACU to home when recovered from anesthesia and after chest x-ray is reviewed. We will review the cytology, pathology and microbiology results with the patient when they become available. Outpatient followup will be with CANDIE Lites, NP and Dr. Shelah.    Lamar Shelah, MD, PhD 11/10/2023, 1:43 PM [facility] [phone] or if no answer before 7:00PM call [phone] For any issues after 7:00PM please call eLink [phone]

## 2023-11-10 NOTE — Transfer of Care (Signed)
 Immediate Anesthesia Transfer of Care Note  Patient: Mariah Kemp  Procedure(s) Performed: VIDEO BRONCHOSCOPY WITH ENDOBRONCHIAL NAVIGATION (Right)  Patient Location: PACU  Anesthesia Type:General  Level of Consciousness: awake, alert , and oriented  Airway & Oxygen Therapy: Patient Spontanous Breathing  Post-op Assessment: Report given to RN, Post -op Vital signs reviewed and stable, and Patient moving all extremities X 4  Post vital signs: Reviewed and stable  Last Vitals:  Vitals Value Taken Time  BP 135/79 11/10/23 13:45  Temp 36.6 C 11/10/23 13:45  Pulse 79 11/10/23 13:52  Resp 17 11/10/23 13:52  SpO2 96 % 11/10/23 13:52  Vitals shown include unfiled device data.  Last Pain:  Vitals:   11/10/23 1012  TempSrc:   PainSc: 0-No pain         Complications: No notable events documented.

## 2023-11-10 NOTE — Anesthesia Postprocedure Evaluation (Signed)
 Anesthesia Post Note  Patient: Mariah Kemp  Procedure(s) Performed: VIDEO BRONCHOSCOPY WITH ENDOBRONCHIAL NAVIGATION (Right)     Patient location during evaluation: PACU Anesthesia Type: General Level of consciousness: awake and alert and oriented Pain management: pain level controlled Vital Signs Assessment: post-procedure vital signs reviewed and stable Respiratory status: spontaneous breathing, nonlabored ventilation and respiratory function stable Cardiovascular status: blood pressure returned to baseline and stable Postop Assessment: no apparent nausea or vomiting Anesthetic complications: no   No notable events documented.  Last Vitals:  Vitals:   11/10/23 1400 11/10/23 1415  BP: 135/83 130/74  Pulse: 81 74  Resp: (!) 25 18  Temp:  36.6 C  SpO2: 98% 97%    Last Pain:  Vitals:   11/10/23 1415  TempSrc:   PainSc: 0-No pain                 Mariah Kemp A.

## 2023-11-10 NOTE — Anesthesia Procedure Notes (Signed)
 Procedure Name: Intubation Date/Time: 11/10/2023 12:29 PM  Performed by: Dianne Burnard RAMAN, CRNAPre-anesthesia Checklist: Patient identified, Emergency Drugs available, Suction available and Patient being monitored Patient Re-evaluated:Patient Re-evaluated prior to induction Oxygen Delivery Method: Circle system utilized Preoxygenation: Pre-oxygenation with 100% oxygen Induction Type: IV induction Ventilation: Mask ventilation without difficulty Laryngoscope Size: McGrath Grade View: Grade I Tube type: Oral Tube size: 8.5 mm Number of attempts: 1 Airway Equipment and Method: Stylet and Oral airway Placement Confirmation: ETT inserted through vocal cords under direct vision, positive ETCO2 and breath sounds checked- equal and bilateral Tube secured with: Tape Dental Injury: Teeth and Oropharynx as per pre-operative assessment

## 2023-11-11 ENCOUNTER — Encounter (HOSPITAL_COMMUNITY): Payer: Self-pay | Admitting: Emergency Medicine

## 2023-11-12 LAB — CULTURE, BAL-QUANTITATIVE W GRAM STAIN
Culture: NO GROWTH
Gram Stain: NONE SEEN

## 2023-11-12 LAB — CYTOLOGY - NON PAP

## 2023-11-12 LAB — ACID FAST SMEAR (AFB, MYCOBACTERIA): Acid Fast Smear: NEGATIVE

## 2023-11-15 LAB — AEROBIC/ANAEROBIC CULTURE W GRAM STAIN (SURGICAL/DEEP WOUND)
Culture: NO GROWTH
Gram Stain: NONE SEEN

## 2023-11-18 ENCOUNTER — Ambulatory Visit (INDEPENDENT_AMBULATORY_CARE_PROVIDER_SITE_OTHER): Admitting: Acute Care

## 2023-11-18 ENCOUNTER — Encounter: Payer: Self-pay | Admitting: Acute Care

## 2023-11-18 VITALS — BP 129/79 | HR 71 | Temp 98.1°F | Ht 60.0 in | Wt 129.2 lb

## 2023-11-18 DIAGNOSIS — Z87891 Personal history of nicotine dependence: Secondary | ICD-10-CM | POA: Diagnosis not present

## 2023-11-18 DIAGNOSIS — R911 Solitary pulmonary nodule: Secondary | ICD-10-CM | POA: Diagnosis not present

## 2023-11-18 DIAGNOSIS — Z9889 Other specified postprocedural states: Secondary | ICD-10-CM

## 2023-11-18 DIAGNOSIS — R9389 Abnormal findings on diagnostic imaging of other specified body structures: Secondary | ICD-10-CM | POA: Diagnosis not present

## 2023-11-18 NOTE — Patient Instructions (Addendum)
 It is good to see you today. I am glad you have done well after the procedure.  We have reviewed your biopsy results which were negative for malignancy. This is great news. We will do a 84-month follow-up CT of the chest to ensure there have been no changes over the next few months.   This will help us  to maintain close surveillance of this nodule. This will be due around the first week of December. You will get a call closer to the time to get that scheduled. You will follow-up with me 1 to 2 weeks after the scan has been completed to review the results Call sooner to be seen for unexplained weight loss or blood in your sputum. Please contact office for sooner follow up if symptoms do not improve or worsen or seek emergency care

## 2023-11-18 NOTE — Progress Notes (Signed)
 History of Present Illness Mariah Kemp is a 63 y.o. female former smoker  with hx of stage 4A gastric cancer and stage 3 right breast cancer who presents to see Dr. Shelah 10/2023 for evaluation of pulmonary nodules. She was referred by her primary care doctor for further evaluation of her lung nodules.    11/18/2023 Discussed the use of AI scribe software for clinical note transcription with the patient, who gave verbal consent to proceed.  History of Present Illness Mariah Kemp is a 63 year old female with history of gastric adenocarcinoma and breast cancer who presents for follow-up of lung findings.  She was  referred for abnormal findings of a right upper lobe nodule that had an increased solid component.  She was seen by Dr. Shelah for consult on 10/23/2023.  After reviewing imaging he felt it was most appropriate to move forward with bronchoscopy with biopsies.  She underwent a biopsy and bronchoscopy on 11/10/2023 to evaluate findings in her right upper lung. Post-procedure, she experienced mild coughing which has since resolved. No fever, hemoptysis, worsening dyspnea, or anesthesia-related issues were noted.  Biopsies of the right upper lobe were negative for malignancy.  Plan will be for 18-month follow-up CT as surveillance.  Patient is in agreement with the plan.  Her medical history includes gastric adenocarcinoma and breast cancer. An initial CT scan of the chest performed years ago during chemotherapy revealed a lung abnormality. A recent CT scan in July showed an increased solid component in the right upper lung, prompting further investigation. The biopsy results did not show malignancy and showed fibroelastic changes.  She denies hemoptysis, unexplained weight loss, or other concerning symptoms.  I have asked her to call to be seen sooner should she develop any of the above symptoms.  She verbalized understanding.     Test Results: Cytology 11/10/2023 A. LUNG, RUL  TARGET #1, NEEDLE ASPIRATIONS  BIOPSIES:  - No malignant cells identified. See Comment.   B. LUNG, RUL TARGET #2, NEEDLE ASPIRATIONS  BIOPSIES:  - No malignant cells identified. See Comment.   BAL No Growth Aerobic/ Anaerobic No Growth  AFB Negative  Fungus>> No fungus observed  CT Chest 09/29/2023 IMPRESSION: Dominant right upper lobe nodule seen on previous exams has an increased solid component on today's study measuring up to 13 mm in average diameter. This interval growth is suspicious and referral to multi disciplinary pulmonary group is recommended. Further imaging by means of PET-CT should be considered.   Scattered nodules bilaterally stable in appearance from 2023 consistent with a benign etiology.    Latest Ref Rng & Units 11/10/2023    9:50 AM 09/18/2023    2:09 PM 09/16/2022    3:02 PM  CBC  WBC 4.0 - 10.5 K/uL 5.2  7.6  7.0   Hemoglobin 12.0 - 15.0 g/dL 88.8  88.2  87.9   Hematocrit 36.0 - 46.0 % 33.3  35.9  35.5   Platelets 150 - 400 K/uL 161  175  210        Latest Ref Rng & Units 11/10/2023    9:50 AM 09/18/2023    2:09 PM 09/16/2022    3:02 PM  BMP  Glucose 70 - 99 mg/dL 87  80  86   BUN 8 - 23 mg/dL 8  11  15    Creatinine 0.44 - 1.00 mg/dL 9.51  9.52  9.53   BUN/Creat Ratio 12 - 28  23  33   Sodium 135 -  145 mmol/L 140  139  140   Potassium 3.5 - 5.1 mmol/L 3.5  4.3  4.3   Chloride 98 - 111 mmol/L 106  101  103   CO2 22 - 32 mmol/L 25  22  27    Calcium  8.9 - 10.3 mg/dL 9.2  9.3  9.8     BNP    Component Value Date/Time   BNP 45 03/27/2018 1512    ProBNP No results found for: PROBNP  PFT No results found for: FEV1PRE, FEV1POST, FVCPRE, FVCPOST, TLC, DLCOUNC, PREFEV1FVCRT, PSTFEV1FVCRT  DG Chest Port 1 View Result Date: 11/10/2023 CLINICAL DATA:  Status post bronchoscopy with biopsy EXAM: PORTABLE CHEST 1 VIEW COMPARISON:  04/13/2018 and chest CT dated 09/29/2023 FINDINGS: Normal-sized heart. Interval patchy opacity in the right  upper lung zone laterally. Two small interval clips in that area. Mild increase in prominence of the interstitial markings without Kerley lines, accentuated by poor inspiration and different technique. No pneumothorax. Stable right breast and axillary surgical clips and upper abdominal surgical clips. Diffuse osteopenia and minimal scoliosis. IMPRESSION: 1. No pneumothorax following bronchoscopy. 2. Interval patchy opacity in the right upper lung zone laterally with adjacent clips, compatible with post bronchoscopic biopsy hemorrhage and underlying biopsied nodule. 3. Mild increase in prominence of the interstitial markings without Kerley lines, compatible with mild chronic interstitial lung disease accentuated by poor inspiration and different technique than on the prior radiograph. Electronically Signed   By: Elspeth Bathe M.D.   On: 11/10/2023 14:54   DG C-ARM BRONCHOSCOPY Result Date: 11/10/2023 C-ARM BRONCHOSCOPY: Fluoroscopy was utilized by the requesting physician.  No radiographic interpretation.     Past medical hx Past Medical History:  Diagnosis Date   Anemia    Asthma    Blood transfusion without reported diagnosis    Breast cancer (HCC)    Depression    Dyspnea    over exertion   Gastrointestinal hemorrhage    Hypertension    Hypothyroidism    Stomach cancer (HCC)      Social History   Tobacco Use   Smoking status: Former    Current packs/day: 0.00    Average packs/day: 0.5 packs/day for 15.0 years (7.5 ttl pk-yrs)    Types: Cigarettes    Start date: 04/28/1979    Quit date: 04/27/1994    Years since quitting: 29.5    Passive exposure: Past   Smokeless tobacco: Never  Vaping Use   Vaping status: Never Used  Substance Use Topics   Alcohol use: No    Alcohol/week: 0.0 standard drinks of alcohol   Drug use: No    [name] reports that she quit smoking about 29 years ago. Her smoking use included cigarettes. She started smoking about 44 years ago. She has a 7.5  pack-year smoking history. She has been exposed to tobacco smoke. She has never used smokeless tobacco. She reports that she does not drink alcohol and does not use drugs.  Tobacco Cessation: Counseling given: Not Answered Former smoker with a 7.5 pack year smoking history  Past surgical hx, Family hx, Social hx all reviewed.  Current Outpatient Medications on File Prior to Visit  Medication Sig   acyclovir  (ZOVIRAX ) 400 MG tablet Take 1 tablet (400 mg total) by mouth 3 (three) times daily as needed. For five days. As needed for cold sore.   albuterol  (VENTOLIN  HFA) 108 (90 Base) MCG/ACT inhaler inhale 1-2 puffs into the lungs every 4 hours if needed for wheezing or shortness of breath.  atorvastatin  (LIPITOR) 10 MG tablet Take 1 tablet (10 mg total) by mouth daily.   clobetasol ointment (TEMOVATE) 0.05 % Apply 1 Application topically 2 (two) times daily.   cyanocobalamin  (VITAMIN B12) 1000 MCG/ML injection Inject 1 mL (1,000 mcg total) into the muscle once a week.   estradiol (ESTRACE) 0.1 MG/GM vaginal cream Place 1 Applicatorful vaginally 2 (two) times daily.   fluticasone -salmeterol (ADVAIR  HFA) 115-21 MCG/ACT inhaler Inhale 2 puffs into the lungs 2 (two) times daily.   levothyroxine  (SYNTHROID ) 150 MCG tablet Take 1 tablet (150 mcg total) by mouth daily.   liothyronine  (CYTOMEL ) 5 MCG tablet Take 1 tablet (5 mcg total) by mouth in the morning and at bedtime.   Needles & Syringes MISC 25 ga x 1  IM needle/syringe   sertraline  (ZOLOFT ) 50 MG tablet Take 1 tablet (50 mg total) by mouth daily.   SYRINGE-NEEDLE, DISP, 3 ML (B-D 3CC LUER-LOK SYR 25GX1) 25G X 1 3 ML MISC USE AS DIRECTED ONCE A WEEK   Vitamin D, Ergocalciferol, (DRISDOL) 1.25 MG (50000 UNIT) CAPS capsule Take 50,000 Units by mouth once a week.   Current Facility-Administered Medications on File Prior to Visit  Medication   0.9 %  sodium chloride  infusion     No Known Allergies  Review Of Systems:  Constitutional:    No  weight loss, night sweats,  Fevers, chills, fatigue, or  lassitude.  HEENT:   No headaches,  Difficulty swallowing,  Tooth/dental problems, or  Sore throat,                No sneezing, itching, ear ache, nasal congestion, post nasal drip,   CV:  No chest pain,  Orthopnea, PND, swelling in lower extremities, anasarca, dizziness, palpitations, syncope.   GI  No heartburn, indigestion, abdominal pain, nausea, vomiting, diarrhea, change in bowel habits, loss of appetite, bloody stools.   Resp: No shortness of breath with exertion or at rest.  No excess mucus, no productive cough,  No non-productive cough,  No coughing up of blood.  No change in color of mucus.  No wheezing.  No chest wall deformity  Skin: no rash or lesions.  GU: no dysuria, change in color of urine, no urgency or frequency.  No flank pain, no hematuria   MS:  No joint pain or swelling.  No decreased range of motion.  No back pain.  Psych:  No change in mood or affect. No depression or anxiety.  No memory loss.   Vital Signs BP 129/79   Pulse 71   Temp 98.1 F (36.7 C) (Oral)   Ht 5' (1.524 m)   Wt 129 lb 3.2 oz (58.6 kg)   SpO2 98%   BMI 25.23 kg/m    Physical Exam:  General- No distress,  A&Ox3, pleasant ENT: No sinus tenderness, TM clear, pale nasal mucosa, no oral exudate,no post nasal drip, no LAN Cardiac: S1, S2, regular rate and rhythm, no murmur Chest: No wheeze/ rales/ dullness; no accessory muscle use, no nasal flaring, no sternal retractions, diminished per bases Abd.: Soft Non-tender, ND, BS +, Body mass index is 25.23 kg/m.  Ext: No clubbing cyanosis, edema, no obvious deformities Neuro:  normal strength, MAE x 4, A&L x 3, appropriate Skin: No rashes, warm and dry, no obvious skin lesions  Psych: normal mood and behavior    Assessment & Plan Solitary pulmonary nodule, right upper lobe, under surveillance Increased solid component on CT.  Biopsy and bronchoscopy negative for malignancy,  suggest  fibroelastic changes.  Plan - Order follow-up CT scan in three months, around December 2nd, at [facility] imaging center. - Instruct to return sooner if symptoms such as hemoptysis or unexplained weight loss occur. - Consider PET scan if there is any growth in the nodule on follow-up imaging.   I spent 20 minutes dedicated to the care of this patient on the date of this encounter to include pre-visit review of records, face-to-face time with the patient discussing conditions above, post visit ordering of testing, clinical documentation with the electronic health record, making appropriate referrals as documented, and communicating necessary information to the patient's healthcare team.     Lauraine JULIANNA Lites, NP 11/18/2023  1:35 PM

## 2023-11-19 ENCOUNTER — Ambulatory Visit: Admitting: Emergency Medicine

## 2023-11-30 ENCOUNTER — Encounter: Payer: Self-pay | Admitting: Medical-Surgical

## 2023-11-30 DIAGNOSIS — I10 Essential (primary) hypertension: Secondary | ICD-10-CM

## 2023-12-05 ENCOUNTER — Ambulatory Visit: Admitting: Professional

## 2023-12-05 ENCOUNTER — Encounter: Payer: Self-pay | Admitting: Professional

## 2023-12-05 DIAGNOSIS — F4323 Adjustment disorder with mixed anxiety and depressed mood: Secondary | ICD-10-CM

## 2023-12-05 NOTE — Progress Notes (Signed)
 [facility] Counselor/Therapist Progress Note  Patient ID: Mariah Kemp, MRN: [id number],    Date: 12/05/2023  Time Spent: 54 minutes 1103-1157am  Treatment Type: Individual Therapy  Risk Assessment: Danger to Self:  No Self-injurious Behavior: No Danger to Others: No  Subjective: The patient arrived on time for her in-person appointment.  Issues addressed: 1-mood -pt is noticing that she is experiencing increased irritability with spouse -discussed that with fall coming she and Ryan will be inside the home more frequently -in spring and summer he was outside a great deal doing yard work -suggested pt may consider some activities she could engage outside the home a few days a week -both she and Ryan like their own space and increasing her activity level with give both alone time -pt has been talking same dosage of Zoloft  since inception -discussed possibility of consulting PCP for bump 2-travel -the beach vacation, Ryan refused to go since he was the only man and decided it really was a girl's trip -pt tried to get him to agree, however, she stopped asking -she, her sister, and friend Tammy had a great time -she and Ryan will be leaving for [facility] this week and she is feeling excited 3-physical health -pt had nodule present but clear until Dec 3rd when she will have repeat bronchoscopy 4-completed yearly treatment plan  Treatment Plan Problems Addressed  Anxiety, Family Conflict, Grief / Loss Unresolved, Type A Behavior Goals 1. Achieve an overall decrease in pressured, driven behaviors. 2. Alleviate sense of time urgency, free-floating anxiety, anger, and self-destructive behaviors. 3. Become a reconstituted/blended family unit that is functional and whose members are bonded to each other. 4. Begin a healthy grieving process around husband's Alzheimer's diagnosis. Objective Attend an Alzheimer's support group. Target Date: 2024-12-04  Frequency: Monthly  Progress: 0 Modality: individual  Related Interventions Ask the client to attend a grief/loss support group and report to the therapist how he/she felt about attending. Objective Verbalize resolution of feelings of guilt and regret associated with cognitively impaired spouse's condition. Target Date: 2024-12-04 Frequency: Monthly  Progress: 0 Modality: individual  Related Interventions Assign the client to make a list of all the regrets associated with actions toward or relationship with the deceased; process the list content toward resolution of these feelings. Objective Identify and voice positives about the cognitively impaired spouse including previous positive experiences, positive characteristics, positive aspects of the relationship, and how these things may be remembered. Target Date: 2024-12-04 Frequency: Monthly  Progress: 0 Modality: individual  Related Interventions Ask the client to discuss and/or list the positive aspects of and memories about his/her relationship with the lost loved one; reinforce the client's expression of positive memories and emotions (e.g., smiling, laughing); encourage the client to share these thoughts with supportive loved ones. Objective Express thoughts and feelings to the cognitively impaired spouse while he is still capable of comprehending Target Date: 2024-12-04 Frequency: Monthly  Progress: 0 Modality: individual  Related Interventions Ask the client to write a letter to the lost person describing his/her fond memories and/or painful and regretful memories, and how he/she currently feels life (or assign Dear : A Letter to a Lost Loved One in the Adult Psychotherapy Homework Planner by Jenniffer); process the letter in session. Objective Reengage in activities with family, friends, coworkers, and others. Target Date: 2024-12-04 Frequency: Monthly  Progress: 0 Modality: individual  Related Interventions Assist the client in  recommitting and reengaging in the primary social positive roles in which he/she has functioned prior to  the loss. Promote behavioral activation by assisting the client in listing activities which he/she previously enjoyed but has not engaged in since experiencing the loss and then encourage reengagement in these activities (or assign Identify and Schedule Pleasant Activities in the Adult Psychotherapy Homework Planner by Jenniffer). Objective Acknowledge dependency on husband and begin to refocus life on independent actions to meet emotional needs. Target Date: 2024-12-04 Frequency: Monthly  Progress: 0 Modality: individual  Related Interventions Explore the feelings of anger or guilt that surround the loss, helping the client understand the sources for such feelings. Assist the client in identifying how he/she depended upon the significant other, expressing and resolving the accompanying feelings of abandonment and of being left alone. 5. Begin the process of reengaging a relationship with adult son. Objective Family members report a desire for and vision of a new sense of connectedness. Target Date: 2024-12-04 Frequency: Monthly  Progress: 0 Modality: individual  Objective Describe the conflicts and the causes of conflicts between self and adult son. Target Date: 2024-12-04 Frequency: Monthly  Progress: 0 Modality: individual  Related Interventions Give verbal permission for the client to have and express own feelings, thoughts, and perspectives in order to foster a sense of autonomy from family. Explore the nature of the client's family conflicts and their perceived causes. Objective Identify own as well as others' role in the family conflicts. Target Date: 2024-12-04 Frequency: Monthly  Progress: 0 Modality: individual  Related Interventions Confront the client when he/she is not taking responsibility for his/her role in the family conflict and reinforce the client for owning  responsibility for his/her contribution to the conflict. Objective Identify ways in which the patient's interactions with son could be tempered with assertiveness. Target Date: 2024-12-04 Frequency: Monthly  Progress: 0 Modality: individual  Related Interventions Assist the parents in identifying areas that need strengthening in their parental team, then work with them to strengthen these areas (or assign Learning to Parent as a Team in the Adult Games developer by [employer]). Objective Report an increase in resolving conflicts with her adult son by talking calmly and assertively rather than aggressively and defensively. Target Date: 2024-12-04 Frequency: Monthly  Progress: 0 Modality: individual  Related Interventions Use role-playing, role reversal, modeling, and behavioral rehearsal to help the client develop assertive ways to resolve conflict with parents (recommend Your Perfect Right: Assertiveness and Equality in Your Life and Relationships by Darrelyn armin Sick). 6. Complete the process of accepting husband's impairment and expected loss of memory. 7. Develop an awareness of how the avoidance of grieving has affected life and begin the healing process. 8. Develop social and recreational activities as a routine part of life. 9. Enhance ability to effectively cope with the full variety of life's worries and anxieties. 10. Formulate and implement a new life attitudinal pattern that allows for a more relaxed pattern of living. Objective Learn and implement calming skills as a lifestyle change and to manage pressure situations. Target Date: 2024-12-04 Frequency: Monthly  Progress: 0 Modality: individual  Related Interventions Assign the client to implement calming techniques in his/her daily life in general and when facing trigger situations; process the results, reinforcing success and provide corrective feedback toward improvement. Objective Verbalize a commitment to  learning new approaches managing self, time, and relationships that emphasize the values of inner and other orientation. Target Date: 2024-12-04 Frequency: Monthly  Progress: 0 Modality: individual  Related Interventions Ask the client to commit to attempting attitude and behavior changes to promote a healthier, less Type A  lifestyle; explore with him/her what changes need to be made to become less Type A. Objective Learn and implement respectful assertive communication knowledge and skills to replace insensitive directness or verbal aggression that is controlling. Target Date: 2024-12-04 Frequency: Monthly  Progress: 0 Modality: individual  Related Interventions Monitor, point out, and reframe the client's actions or verbalizations that reflect a self-centered or critical approach to others; practice alternatives using behavioral strategies such as modeling, role-playing, and/or role reversal. Train the client in assertive communication with emphasis on recognizing and refraining from aggressive communication (e.g., ignoring of the rights of others) to respectful, assertive communication. Objective Learn problem-solving and/or conflict resolution skills to manage interpersonal problems. Target Date: 2024-12-04 Frequency: Monthly  Progress: 0 Modality: individual  Related Interventions Teach the client conflict resolution skills (e.g., empathy, active listening, I messages, respectful communication, assertiveness without aggression, compromise); use role- play and modeling to apply these skills to current conflicts. Teach the client problem-solving skills (e.g., define the problem specifically, brainstorm options, list the pros and cons of each option, chose and implement an option, evaluate the outcome); use modeling, role-playing, and behavior rehearsal to apply this skill to several current conflicts (or assign Plan Before Acting in the Adult Psychotherapy Homework Planner by  Jenniffer). Objective Demonstrate decreased impatience with others by talking of appreciating and understanding the good qualities in others. Target Date: 2024-12-04 Frequency: Monthly  Progress: 0 Modality: individual  Related Interventions Assign the client to talk to an associate or child, focusing on listening to the other person and learning several good things about that person; process the experience. 11. Learn and implement coping skills that result in a reduction of anxiety and worry, and improved daily functioning. 12. Stabilize anxiety level while increasing ability to function on a daily basis. Objective Verbalize an understanding of the cognitive, physiological, and behavioral components of anxiety and its treatment. Target Date: 2024-12-04 Frequency: Monthly  Progress: 0 Modality: individual  Related Interventions Discuss how generalized anxiety typically involves excessive worry about unrealistic threats, various bodily expressions of tension, overarousal, and hypervigilance, and avoidance of what is threatening that interact to maintain the problem (see Mastery of Your Anxiety and Worry: Therapist Guide by Venson River, and Barlow; Treating Generalized Anxiety Disorder by Rygh and Red). Discuss how treatment targets worry, anxiety symptoms, and avoidance to help the client manage worry effectively, reduce overarousal, and eliminate unnecessary avoidance. Objective Learn and implement calming skills to reduce overall anxiety and manage anxiety symptoms. Target Date: 2024-12-04 Frequency: Monthly  Progress: 0 Modality: individual  Related Interventions Teach the client calming/relaxation skills (e.g., applied relaxation, progressive muscle relaxation, cue controlled relaxation; mindful breathing; biofeedback) and how to discriminate better between relaxation and tension; teach the client how to apply these skills to his/her daily life (e.g., New Directions in Progressive  Muscle Relaxation by Thornell Collier, and Hazlett-Stevens; Treating Generalized Anxiety Disorder by Rygh and Red). Assign the client to read about progressive muscle relaxation and other calming strategies in relevant books or treatment manuals (e.g., Progressive Relaxation Training by Thornell and Collier; Mastery of Your Anxiety and Worry: Workbook by River armin Given). Objective Verbalize an understanding of the role that cognitive biases play in excessive irrational worry and persistent anxiety symptoms. Target Date: 2024-12-04 Frequency: Monthly  Progress: 0 Modality: individual  Related Interventions Discuss examples demonstrating that unrealistic worry typically overestimates the probability of threats and underestimates or overlooks the client's ability to manage realistic demands (or assign Past Successful Anxiety Coping in the Adult Psychotherapy Homework Planner  by Jenniffer). Assist the client in analyzing his/her worries by examining potential biases such as the probability of the negative expectation occurring, the real consequences of it occurring, his/her ability to control the outcome, the worst possible outcome, and his/her ability to accept it (see Analyze the Probability of a Feared Event in the Adult Psychotherapy Homework Planner by Jenniffer; Cognitive Therapy of Anxiety Disorders by Gretta armin Mon). Objective Learn to accept limitations in life and commit to tolerating, rather than avoiding, unpleasant emotions while accomplishing meaningful goals. Target Date: 2024-12-04 Frequency: Monthly  Progress: 0 Modality: individual  Related Interventions Use techniques from Acceptance and Commitment Therapy to help client accept uncomfortable realities such as lack of complete control, imperfections, and uncertainty and tolerate unpleasant emotions and thoughts in order to accomplish value-consistent goals. Objective Reestablish a consistent sleep-wake cycle. Target  Date: 2024-12-04 Frequency: Monthly  Progress: 0 Modality: individual  Related Interventions Teach and implement sleep hygiene practices to help the client reestablish a consistent sleep-wake cycle; review, reinforce success, and provide corrective feedback toward improvement.  Diagnosis:Adjustment disorder with mixed anxiety and depressed mood  Plan:  -meet again on Thursday, January 01, 2024 at [place] in-person

## 2023-12-06 LAB — CMP14+EGFR
ALT: 59 IU/L — ABNORMAL HIGH (ref 0–32)
AST: 45 IU/L — ABNORMAL HIGH (ref 0–40)
Albumin: 4.2 g/dL (ref 3.9–4.9)
Alkaline Phosphatase: 96 IU/L (ref 49–135)
BUN/Creatinine Ratio: 21 (ref 12–28)
BUN: 10 mg/dL (ref 8–27)
Bilirubin Total: <0.2 mg/dL (ref 0.0–1.2)
CO2: 24 mmol/L (ref 20–29)
Calcium: 9.4 mg/dL (ref 8.7–10.3)
Chloride: 104 mmol/L (ref 96–106)
Creatinine, Ser: 0.48 mg/dL — ABNORMAL LOW (ref 0.57–1.00)
Globulin, Total: 2.5 g/dL (ref 1.5–4.5)
Glucose: 75 mg/dL (ref 70–99)
Potassium: 3.9 mmol/L (ref 3.5–5.2)
Sodium: 141 mmol/L (ref 134–144)
Total Protein: 6.7 g/dL (ref 6.0–8.5)
eGFR: 108 mL/min/1.73 (ref >59)

## 2023-12-08 ENCOUNTER — Ambulatory Visit: Payer: Self-pay | Admitting: Medical-Surgical

## 2023-12-08 DIAGNOSIS — R748 Abnormal levels of other serum enzymes: Secondary | ICD-10-CM

## 2023-12-09 ENCOUNTER — Other Ambulatory Visit (HOSPITAL_COMMUNITY): Payer: Self-pay

## 2023-12-09 ENCOUNTER — Telehealth: Payer: Self-pay

## 2023-12-09 ENCOUNTER — Encounter: Payer: Self-pay | Admitting: Hematology

## 2023-12-09 NOTE — Telephone Encounter (Signed)
 Pharmacy Patient Advocate Encounter   Received notification from CoverMyMeds that prior authorization for Fluticasone -Salmeterol 115-21mcg is required/requested.   Insurance verification completed.   The patient is insured through [facility] .   Per test claim: PA required; PA submitted to above mentioned insurance via Latent Key/confirmation #/EOC [id number] Status is pending

## 2023-12-10 NOTE — Telephone Encounter (Signed)
 Pharmacy Patient Advocate Encounter  Received notification from [employer] that Prior Authorization for Fluticasone -Salmeterol 115-21mcg has been DENIED.  See denial reason below. No denial letter attached in CMM. Will attach denial letter to Media tab once received.   PA #/Case ID/Reference #: [id number]

## 2023-12-12 ENCOUNTER — Encounter: Payer: Self-pay | Admitting: Medical-Surgical

## 2023-12-12 DIAGNOSIS — Z636 Dependent relative needing care at home: Secondary | ICD-10-CM

## 2023-12-12 LAB — FUNGUS CULTURE WITH STAIN

## 2023-12-12 LAB — FUNGAL ORGANISM REFLEX

## 2023-12-12 LAB — FUNGUS CULTURE RESULT

## 2023-12-12 MED ORDER — SERTRALINE HCL 100 MG PO TABS: 100.0000 mg | ORAL_TABLET | Freq: Every day | ORAL | 3 refills | Status: AC

## 2023-12-15 DIAGNOSIS — M810 Age-related osteoporosis without current pathological fracture: Secondary | ICD-10-CM | POA: Insufficient documentation

## 2023-12-15 MED ORDER — MOMETASONE FURO-FORMOTEROL FUM 100-5 MCG/ACT IN AERO: 2.0000 | INHALATION_SPRAY | Freq: Two times a day (BID) | RESPIRATORY_TRACT | 5 refills | Status: AC

## 2023-12-15 NOTE — Addendum Note (Signed)
 Addended byBETHA WILLO MINI on: 12/15/2023 07:28 PM   Modules accepted: Orders

## 2023-12-18 ENCOUNTER — Other Ambulatory Visit: Payer: Self-pay | Admitting: Medical-Surgical

## 2023-12-23 ENCOUNTER — Other Ambulatory Visit: Payer: Self-pay | Admitting: Medical-Surgical

## 2023-12-23 DIAGNOSIS — E782 Mixed hyperlipidemia: Secondary | ICD-10-CM

## 2023-12-25 LAB — ACID FAST CULTURE WITH REFLEXED SENSITIVITIES (MYCOBACTERIA): Acid Fast Culture: NEGATIVE

## 2023-12-26 ENCOUNTER — Other Ambulatory Visit

## 2023-12-26 LAB — TSH: TSH: 6.85 m[IU]/L — ABNORMAL HIGH (ref 0.40–4.50)

## 2023-12-29 ENCOUNTER — Other Ambulatory Visit

## 2023-12-31 ENCOUNTER — Other Ambulatory Visit: Payer: Self-pay | Admitting: Internal Medicine

## 2023-12-31 MED ORDER — LEVOTHYROXINE SODIUM 150 MCG PO TABS: 150.0000 ug | ORAL_TABLET | ORAL | Status: DC

## 2024-01-01 ENCOUNTER — Ambulatory Visit: Admitting: Professional

## 2024-01-01 ENCOUNTER — Encounter: Payer: Self-pay | Admitting: Professional

## 2024-01-01 ENCOUNTER — Ambulatory Visit
Admission: EM | Admit: 2024-01-01 | Discharge: 2024-01-01 | Disposition: A | Discharge status: Home / Self Care | Source: Ambulatory Visit | Attending: Family Medicine | Admitting: Family Medicine

## 2024-01-01 ENCOUNTER — Other Ambulatory Visit: Payer: Self-pay

## 2024-01-01 DIAGNOSIS — F4323 Adjustment disorder with mixed anxiety and depressed mood: Secondary | ICD-10-CM

## 2024-01-01 DIAGNOSIS — B354 Tinea corporis: Secondary | ICD-10-CM | POA: Diagnosis not present

## 2024-01-01 MED ORDER — CLOTRIMAZOLE-BETAMETHASONE 1-0.05 % EX CREA: TOPICAL_CREAM | CUTANEOUS | 0 refills | Status: DC

## 2024-01-01 NOTE — Progress Notes (Signed)
 Mariah Kemp Progress Note  Patient ID: Mariah Kemp, MRN: [id number],    Date: 01/01/2024  Time Spent: 54 minutes 1001-1055am  Treatment Type: Individual Therapy  Risk Assessment: Danger to Self:  No Self-injurious Behavior: No Danger to Others: No  Subjective: The patient arrived on time for her in-person appointment.  Issues addressed: 1-mood -pleasant and engaged -appears less irritable -appearance is improved with hair styled, looks well rested, and nicely groomed -pt did meet w/ PCP regarding medication increase and is now taking 100mg  Sertraline  since 12/12/2023 -pt does not notice any difference  but thinks she needs to ask sister if she has seen any improvement 2-travel -trip to [facility] was wonderful -initially was somewhat concerned for Ryan since he was very disoriented -after some melatonin and a good night's rest things were fine -pt's favorite thing was looking down over a huge waterfall -she thinks in the future she will not rent a car but engage a bus tour so that she can enjoy more actively -pt is uncertain of Smitty ability to travel moving forward 3-Walter - has noticed some progression in the disease -she has noticed he will be asking about getting calls to drive his route and he has not worked in several years -he also escalated and commented forcefully how but he dislikes his first wife -discussed [facility] benefit opportunities for pt to include in home care so that pt has some form of respite  Treatment Plan Problems Addressed  Anxiety, Family Conflict, Grief / Loss Unresolved, Type A Behavior Goals 1. Achieve an overall decrease in pressured, driven behaviors. 2. Alleviate sense of time urgency, free-floating anxiety, anger, and self-destructive behaviors. 3. Become a reconstituted/blended family unit that is functional and whose members are bonded to each other. 4. Begin a healthy grieving process around  husband's Alzheimer's diagnosis. Objective Attend an Alzheimer's support group. Target Date: 2024-12-04 Frequency: Monthly  Progress: 0 Modality: individual  Related Interventions Ask the client to attend a grief/loss support group and report to the therapist how he/she felt about attending. Objective Verbalize resolution of feelings of guilt and regret associated with cognitively impaired spouse's condition. Target Date: 2024-12-04 Frequency: Monthly  Progress: 0 Modality: individual  Related Interventions Assign the client to make a list of all the regrets associated with actions toward or relationship with the deceased; process the list content toward resolution of these feelings. Objective Identify and voice positives about the cognitively impaired spouse including previous positive experiences, positive characteristics, positive aspects of the relationship, and how these things may be remembered. Target Date: 2024-12-04 Frequency: Monthly  Progress: 0 Modality: individual  Related Interventions Ask the client to discuss and/or list the positive aspects of and memories about his/her relationship with the lost loved one; reinforce the client's expression of positive memories and emotions (e.g., smiling, laughing); encourage the client to share these thoughts with supportive loved ones. Objective Express thoughts and feelings to the cognitively impaired spouse while he is still capable of comprehending Target Date: 2024-12-04 Frequency: Monthly  Progress: 0 Modality: individual  Related Interventions Ask the client to write a letter to the lost person describing his/her fond memories and/or painful and regretful memories, and how he/she currently feels life (or assign Dear : A Letter to a Lost Loved One in the Adult Psychotherapy Homework Planner by Jenniffer); process the letter in session. Objective Reengage in activities with family, friends, coworkers, and others. Target Date:  2024-12-04 Frequency: Monthly  Progress: 0 Modality: individual  Related Interventions Assist the  client in recommitting and reengaging in the primary social positive roles in which he/she has functioned prior to the loss. Promote behavioral activation by assisting the client in listing activities which he/she previously enjoyed but has not engaged in since experiencing the loss and then encourage reengagement in these activities (or assign Identify and Schedule Pleasant Activities in the Adult Psychotherapy Homework Planner by Jenniffer). Objective Acknowledge dependency on husband and begin to refocus life on independent actions to meet emotional needs. Target Date: 2024-12-04 Frequency: Monthly  Progress: 0 Modality: individual  Related Interventions Explore the feelings of anger or guilt that surround the loss, helping the client understand the sources for such feelings. Assist the client in identifying how he/she depended upon the significant other, expressing and resolving the accompanying feelings of abandonment and of being left alone. 5. Begin the process of reengaging a relationship with adult son. Objective Family members report a desire for and vision of a new sense of connectedness. Target Date: 2024-12-04 Frequency: Monthly  Progress: 0 Modality: individual  Objective Describe the conflicts and the causes of conflicts between self and adult son. Target Date: 2024-12-04 Frequency: Monthly  Progress: 0 Modality: individual  Related Interventions Give verbal permission for the client to have and express own feelings, thoughts, and perspectives in order to foster a sense of autonomy from family. Explore the nature of the client's family conflicts and their perceived causes. Objective Identify own as well as others' role in the family conflicts. Target Date: 2024-12-04 Frequency: Monthly  Progress: 0 Modality: individual  Related Interventions Confront the client when he/she is  not taking responsibility for his/her role in the family conflict and reinforce the client for owning responsibility for his/her contribution to the conflict. Objective Identify ways in which the patient's interactions with son could be tempered with assertiveness. Target Date: 2024-12-04 Frequency: Monthly  Progress: 0 Modality: individual  Related Interventions Assist the parents in identifying areas that need strengthening in their parental team, then work with them to strengthen these areas (or assign Learning to Parent as a Team in the Adult Games developer by [employer]). Objective Report an increase in resolving conflicts with her adult son by talking calmly and assertively rather than aggressively and defensively. Target Date: 2024-12-04 Frequency: Monthly  Progress: 0 Modality: individual  Related Interventions Use role-playing, role reversal, modeling, and behavioral rehearsal to help the client develop assertive ways to resolve conflict with parents (recommend Your Perfect Right: Assertiveness and Equality in Your Life and Relationships by Darrelyn armin Sick). 6. Complete the process of accepting husband's impairment and expected loss of memory. 7. Develop an awareness of how the avoidance of grieving has affected life and begin the healing process. 8. Develop social and recreational activities as a routine part of life. 9. Enhance ability to effectively cope with the full variety of life's worries and anxieties. 10. Formulate and implement a new life attitudinal pattern that allows for a more relaxed pattern of living. Objective Learn and implement calming skills as a lifestyle change and to manage pressure situations. Target Date: 2024-12-04 Frequency: Monthly  Progress: 0 Modality: individual  Related Interventions Assign the client to implement calming techniques in his/her daily life in general and when facing trigger situations; process the results, reinforcing  success and provide corrective feedback toward improvement. Objective Verbalize a commitment to learning new approaches managing self, time, and relationships that emphasize the values of inner and other orientation. Target Date: 2024-12-04 Frequency: Monthly  Progress: 0 Modality: individual  Related Interventions  Ask the client to commit to attempting attitude and behavior changes to promote a healthier, less Type A lifestyle; explore with him/her what changes need to be made to become less Type A. Objective Learn and implement respectful assertive communication knowledge and skills to replace insensitive directness or verbal aggression that is controlling. Target Date: 2024-12-04 Frequency: Monthly  Progress: 0 Modality: individual  Related Interventions Monitor, point out, and reframe the client's actions or verbalizations that reflect a self-centered or critical approach to others; practice alternatives using behavioral strategies such as modeling, role-playing, and/or role reversal. Train the client in assertive communication with emphasis on recognizing and refraining from aggressive communication (e.g., ignoring of the rights of others) to respectful, assertive communication. Objective Learn problem-solving and/or conflict resolution skills to manage interpersonal problems. Target Date: 2024-12-04 Frequency: Monthly  Progress: 0 Modality: individual  Related Interventions Teach the client conflict resolution skills (e.g., empathy, active listening, I messages, respectful communication, assertiveness without aggression, compromise); use role- play and modeling to apply these skills to current conflicts. Teach the client problem-solving skills (e.g., define the problem specifically, brainstorm options, list the pros and cons of each option, chose and implement an option, evaluate the outcome); use modeling, role-playing, and behavior rehearsal to apply this skill to several current  conflicts (or assign Plan Before Acting in the Adult Psychotherapy Homework Planner by Jenniffer). Objective Demonstrate decreased impatience with others by talking of appreciating and understanding the good qualities in others. Target Date: 2024-12-04 Frequency: Monthly  Progress: 0 Modality: individual  Related Interventions Assign the client to talk to an associate or child, focusing on listening to the other person and learning several good things about that person; process the experience. 11. Learn and implement coping skills that result in a reduction of anxiety and worry, and improved daily functioning. 12. Stabilize anxiety level while increasing ability to function on a daily basis. Objective Verbalize an understanding of the cognitive, physiological, and behavioral components of anxiety and its treatment. Target Date: 2024-12-04 Frequency: Monthly  Progress: 0 Modality: individual  Related Interventions Discuss how generalized anxiety typically involves excessive worry about unrealistic threats, various bodily expressions of tension, overarousal, and hypervigilance, and avoidance of what is threatening that interact to maintain the problem (see Mastery of Your Anxiety and Worry: Therapist Guide by Venson River, and Barlow; Treating Generalized Anxiety Disorder by Rygh and Red). Discuss how treatment targets worry, anxiety symptoms, and avoidance to help the client manage worry effectively, reduce overarousal, and eliminate unnecessary avoidance. Objective Learn and implement calming skills to reduce overall anxiety and manage anxiety symptoms. Target Date: 2024-12-04 Frequency: Monthly  Progress: 0 Modality: individual  Related Interventions Teach the client calming/relaxation skills (e.g., applied relaxation, progressive muscle relaxation, cue controlled relaxation; mindful breathing; biofeedback) and how to discriminate better between relaxation and tension; teach the client  how to apply these skills to his/her daily life (e.g., New Directions in Progressive Muscle Relaxation by Thornell Collier, and Hazlett-Stevens; Treating Generalized Anxiety Disorder by Rygh and Red). Assign the client to read about progressive muscle relaxation and other calming strategies in relevant books or treatment manuals (e.g., Progressive Relaxation Training by Thornell and Collier; Mastery of Your Anxiety and Worry: Workbook by River armin Given). Objective Verbalize an understanding of the role that cognitive biases play in excessive irrational worry and persistent anxiety symptoms. Target Date: 2024-12-04 Frequency: Monthly  Progress: 0 Modality: individual  Related Interventions Discuss examples demonstrating that unrealistic worry typically overestimates the probability of threats and underestimates or overlooks the  client's ability to manage realistic demands (or assign Past Successful Anxiety Coping in the Adult Psychotherapy Homework Planner by Jenniffer). Assist the client in analyzing his/her worries by examining potential biases such as the probability of the negative expectation occurring, the real consequences of it occurring, his/her ability to control the outcome, the worst possible outcome, and his/her ability to accept it (see Analyze the Probability of a Feared Event in the Adult Psychotherapy Homework Planner by Jenniffer; Cognitive Therapy of Anxiety Disorders by Gretta armin Mon). Objective Learn to accept limitations in life and commit to tolerating, rather than avoiding, unpleasant emotions while accomplishing meaningful goals. Target Date: 2024-12-04 Frequency: Monthly  Progress: 0 Modality: individual  Related Interventions Use techniques from Acceptance and Commitment Therapy to help client accept uncomfortable realities such as lack of complete control, imperfections, and uncertainty and tolerate unpleasant emotions and thoughts in order to accomplish  value-consistent goals. Objective Reestablish a consistent sleep-wake cycle. Target Date: 2024-12-04 Frequency: Monthly  Progress: 0 Modality: individual  Related Interventions Teach and implement sleep hygiene practices to help the client reestablish a consistent sleep-wake cycle; review, reinforce success, and provide corrective feedback toward improvement.  Diagnosis:Adjustment disorder with mixed anxiety and depressed mood  Plan:  -meet again on Thursday, January 22, 2024 at 12pm in-person

## 2024-01-01 NOTE — ED Provider Notes (Signed)
 [facility]    CSN: [id number] Arrival date & time: 01/01/24  0912      History   Chief Complaint Chief Complaint  Patient presents with   Rash    HPI Mariah Kemp is a 63 y.o. female.   HPI  Patient is here for a rash.  It is on her anterior chest.  It is slowly spreading.  It itches terribly.  She has tried a number of treatments including steroids, emollients, oral antihistamines.  It has been present for over 2 weeks.  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[facility])     Patient Active Problem List   Diagnosis Date Noted   Pulmonary nodules 10/23/2023   Emotional stress 10/12/2021   Acquired hypothyroidism 07/19/2020   S/P gastrectomy 10/02/2018   Port-A-Cath in place 05/13/2018   Iron deficiency anemia due to chronic blood loss 04/03/2018   Gastric cancer (HCC) 04/02/2018   Symptomatic anemia 03/31/2018   B12 deficiency 03/31/2018   Palpitations 03/27/2018   Asthma 09/17/2017   Mixed hyperlipidemia 09/24/2016   Elevated fasting glucose 09/24/2016   Cold sore 04/02/2016   Nasal polyposis 09/07/2014   Hypertriglyceridemia 08/24/2014   History of breast cancer 08/19/2014   Chronic bronchitis (HCC) 05/31/2014   Essential hypertension 05/31/2014    Past Surgical History:  Procedure Laterality Date   ABDOMINAL HYSTERECTOMY  1996   BARIATRIC SURGERY     stomach removal   BILATERAL SALPINGOOPHORECTOMY  2007   BIOPSY  03/29/2018   Procedure: BIOPSY;  Surgeon: Dianna Specking, MD;  Location: [facility] ENDOSCOPY;  Service: Endoscopy;;   CARPAL TUNNEL RELEASE     COLONOSCOPY N/A 03/29/2018   Procedure: COLONOSCOPY;  Surgeon: Dianna Specking, MD;  Location: [facility] ENDOSCOPY;  Service: Endoscopy;   Laterality: N/A;   COLONOSCOPY     ESOPHAGOGASTRODUODENOSCOPY N/A 03/29/2018   Procedure: ESOPHAGOGASTRODUODENOSCOPY (EGD);  Surgeon: Dianna Specking, MD;  Location: [facility] ENDOSCOPY;  Service: Endoscopy;  Laterality: N/A;   GASTRECTOMY N/A 10/02/2018   Procedure: TOTAL GASTRECTOMY;  Surgeon: Aron Shoulders, MD;  Location: [facility] OR;  Service: General;  Laterality: N/A;   GASTROSTOMY N/A 10/02/2018   Procedure: FEEDING TUBE PLACEMENT;  Surgeon: Aron Shoulders, MD;  Location: [facility] OR;  Service: General;  Laterality: N/A;   LAPAROSCOPY N/A 04/13/2018   Procedure: LAPAROSCOPY DIAGNOSTIC;  Surgeon: Aron Shoulders, MD;  Location: [facility] ORS;  Service: General;  Laterality: N/A;   LAPAROSCOPY N/A 10/02/2018   Procedure: LAPAROSCOPY DIAGNOSTIC;  Surgeon: Aron Shoulders, MD;  Location: [facility] OR;  Service: General;  Laterality: N/A;   MASTECTOMY Right 2007   PORT-A-CATH REMOVAL Left 11/07/2020   Procedure: PORT REMOVAL;  Surgeon: Aron Shoulders, MD;  Location: [facility] OR;  Service: General;  Laterality: Left;   PORTACATH PLACEMENT Left 04/13/2018   Procedure: INSERTION PORT-A-CATH ERAS PATHWAY;  Surgeon: Aron Shoulders, MD;  Location: [facility] ORS;  Service: General;  Laterality: Left;  subclavian   UPPER GASTROINTESTINAL ENDOSCOPY     VIDEO BRONCHOSCOPY WITH ENDOBRONCHIAL NAVIGATION Right 11/10/2023   Procedure: VIDEO BRONCHOSCOPY WITH ENDOBRONCHIAL NAVIGATION;  Surgeon: Shelah Lamar RAMAN, MD;  Location: [facility] ENDOSCOPY;  Service: Pulmonary;  Laterality: Right;    OB History  No obstetric history on file.      Home Medications    Prior to Admission medications   Medication Sig Start Date End Date Taking? Authorizing Provider  clotrimazole-betamethasone (LOTRISONE) cream Apply to affected area 2 times daily until clears 01/01/24  Yes Maranda Jamee Jacob, MD  acyclovir  (ZOVIRAX ) 400 MG tablet Take 1 tablet (400 mg total) by mouth 3 (three) times daily as needed. For five days. As needed for cold sore. 09/18/23   Willo Mini, NP   albuterol  (VENTOLIN  HFA) 108 (90 Base) MCG/ACT inhaler inhale 1-2 puffs into the lungs every 4 hours if needed for wheezing or shortness of breath. 12/22/23   Willo Mini, NP  atorvastatin  (LIPITOR) 10 MG tablet Take 1 tablet (10 mg total) by mouth daily. 09/18/23   Willo Mini, NP  clobetasol ointment (TEMOVATE) 0.05 % Apply 1 Application topically 2 (two) times daily. 09/09/23   [provider]  cyanocobalamin  (VITAMIN B12) 1000 MCG/ML injection Inject 1 mL (1,000 mcg total) into the muscle once a week. 09/18/23   Willo Mini, NP  estradiol (ESTRACE) 0.1 MG/GM vaginal cream Place 1 Applicatorful vaginally 2 (two) times daily. 09/09/23   [provider]  levothyroxine  (SYNTHROID ) 150 MCG tablet Take 1 tablet (150 mcg total) by mouth as directed. 2 tabs on Sundays and 1 tablet rest of the week 12/31/23   Shamleffer, Ibtehal Jaralla, MD  liothyronine  (CYTOMEL ) 5 MCG tablet Take 1 tablet (5 mcg total) by mouth in the morning and at bedtime. 06/24/23   Shamleffer, Ibtehal Jaralla, MD  mometasone-formoterol  (DULERA) 100-5 MCG/ACT AERO Inhale 2 puffs into the lungs 2 (two) times daily. 12/15/23   Willo Mini, NP  Needles & Syringes MISC 25 ga x 1  IM needle/syringe 09/18/23   Willo Mini, NP  sertraline  (ZOLOFT ) 100 MG tablet Take 1 tablet (100 mg total) by mouth daily. 12/12/23   Jessup, Joy, NP  SYRINGE-NEEDLE, DISP, 3 ML (B-D 3CC LUER-LOK SYR 25GX1) 25G X 1 3 ML MISC USE AS DIRECTED ONCE A WEEK 09/18/23   Willo Mini, NP  Vitamin D, Ergocalciferol, (DRISDOL) 1.25 MG (50000 UNIT) CAPS capsule Take 50,000 Units by mouth once a week. 10/27/23   [provider]    Family History Family History  Problem Relation Age of Onset   Liver cancer Father    Cancer Paternal Uncle        unknown type cancer   Cirrhosis Mother    Colon cancer Neg Hx    Esophageal cancer Neg Hx    Rectal cancer Neg Hx    Stomach cancer Neg Hx     Social History Social History   Tobacco Use   Smoking  status: Former    Current packs/day: 0.00    Average packs/day: 0.5 packs/day for 15.0 years (7.5 ttl pk-yrs)    Types: Cigarettes    Start date: 04/28/1979    Quit date: 04/27/1994    Years since quitting: 29.7    Passive exposure: Past   Smokeless tobacco: Never  Vaping Use   Vaping status: Never Used  Substance Use Topics   Alcohol use: No    Alcohol/week: 0.0 standard drinks of alcohol   Drug use: No     Allergies   Patient has no known allergies.   Review of Systems Review of Systems See HPI  Physical Exam Triage Vital Signs ED Triage Vitals  Encounter Vitals Group     BP 01/01/24 0918 123/78     Girls Systolic BP Percentile --  Girls Diastolic BP Percentile --      Boys Systolic BP Percentile --      Boys Diastolic BP Percentile --      Pulse Rate 01/01/24 0918 77     Resp 01/01/24 0918 16     Temp 01/01/24 0918 97.9 F (36.6 C)     Temp src --      SpO2 01/01/24 0918 95 %     Weight --      Height --      Head Circumference --      Peak Flow --      Pain Score 01/01/24 0921 0     Pain Loc --      Pain Education --      Exclude from Growth Chart --    No data found.  Updated Vital Signs BP 123/78   Pulse 77   Temp 97.9 F (36.6 C)   Resp 16   SpO2 95%      Physical Exam Vitals reviewed.  Constitutional:      General: She is not in acute distress.    Appearance: She is well-developed and normal weight.     Comments: Has trouble keeping her hands off of her rash  HENT:     Head: Normocephalic and atraumatic.  Eyes:     Conjunctiva/sclera: Conjunctivae normal.     Pupils: Pupils are equal, round, and reactive to light.  Cardiovascular:     Rate and Rhythm: Normal rate.  Pulmonary:     Effort: Pulmonary effort is normal. No respiratory distress.  Chest:    Abdominal:     General: There is no distension.     Palpations: Abdomen is soft.  Musculoskeletal:        General: Normal range of motion.     Cervical back: Normal range of  motion.  Skin:    General: Skin is warm and dry.  Neurological:     Mental Status: She is alert.      UC Treatments / Results  Labs (all labs ordered are listed, but only abnormal results are displayed) Labs Reviewed - No data to display  EKG   Radiology No results found.  Procedures Procedures (including critical care time)  Medications Ordered in UC Medications - No data to display  Initial Impression / Assessment and Plan / UC Course  I have reviewed the triage vital signs and the nursing notes.  Pertinent labs & imaging results that were available during my care of the patient were reviewed by me and considered in my medical decision making (see chart for details).     I explained to the patient this looks most like a tinea rash.  Will treat with a steroid antifungal combination to help her with the itching.  She should use this twice a day for 2 weeks and rub in thoroughly.  See PCP if it fails to improve Final Clinical Impressions(s) / UC Diagnoses   Final diagnoses:  Tinea corporis     Discharge Instructions      lotrisone has a combination of an antifungal and a steroid Apply this to the rash 2 times a day and rub in thoroughly If it has not cleared up in 2 weeks return to your primary care for reevaluation You may take the nondrowsy antihistamine (cetirizine or loratadine) twice a day until rash clears   ED Prescriptions     Medication Sig Dispense Auth. Provider   clotrimazole-betamethasone (LOTRISONE) cream Apply to affected area  2 times daily until clears 30 g Maranda Jamee Jacob, MD      PDMP not reviewed this encounter.   Maranda Jamee Jacob, MD 01/01/24 1140

## 2024-01-01 NOTE — ED Triage Notes (Signed)
 C/o rash since 29th of September. Rash is to chest, is itchy and burns. Reports rash is spreading, started as a small circle. Has been taking cetirizine, benadryl .

## 2024-01-01 NOTE — Discharge Instructions (Addendum)
 lotrisone has a combination of an antifungal and a steroid Apply this to the rash 2 times a day and rub in thoroughly If it has not cleared up in 2 weeks return to your primary care for reevaluation You may take the nondrowsy antihistamine (cetirizine or loratadine) twice a day until rash clears

## 2024-01-22 ENCOUNTER — Encounter: Payer: Self-pay | Admitting: Professional

## 2024-01-22 ENCOUNTER — Ambulatory Visit (INDEPENDENT_AMBULATORY_CARE_PROVIDER_SITE_OTHER): Admitting: Professional

## 2024-01-22 DIAGNOSIS — F4323 Adjustment disorder with mixed anxiety and depressed mood: Secondary | ICD-10-CM | POA: Diagnosis not present

## 2024-01-22 NOTE — Progress Notes (Signed)
 [facility] Counselor/Therapist Progress Note  Patient ID: Mariah Kemp, MRN: [id number],    Date: 01/22/2024  Time Spent: 51 minutes 1001-1052am  Treatment Type: Individual Therapy  Risk Assessment: Danger to Self:  No Self-injurious Behavior: No Danger to Others: No  Subjective: The patient arrived on time for her in-person appointment.  Issues addressed: 1-mood -feeling tired and depressed -feeling frustrated -feelings crowded by husband always needing to find her  2-husband and caregiving -pt notices that her spouse is depressed since he can no longer mow the lawn -pt compared in to most of the caregiver benefits symptoms -Ryan is getting up 3-4 times per night and he roams when he does -caregiver support group  Treatment Plan Problems Addressed  Anxiety, Family Conflict, Grief / Loss Unresolved, Type A Behavior Goals 1. Achieve an overall decrease in pressured, driven behaviors. 2. Alleviate sense of time urgency, free-floating anxiety, anger, and self-destructive behaviors. 3. Become a reconstituted/blended family unit that is functional and whose members are bonded to each other. 4. Begin a healthy grieving process around husband's Alzheimer's diagnosis. Objective Attend an Alzheimer's support group. Target Date: 2024-12-04 Frequency: Monthly  Progress: 0 Modality: individual  Related Interventions Ask the client to attend a grief/loss support group and report to the therapist how he/she felt about attending. Objective Verbalize resolution of feelings of guilt and regret associated with cognitively impaired spouse's condition. Target Date: 2024-12-04 Frequency: Monthly  Progress: 0 Modality: individual  Related Interventions Assign the client to make a list of all the regrets associated with actions toward or relationship with the deceased; process the list content toward resolution of these feelings. Objective Identify and voice positives  about the cognitively impaired spouse including previous positive experiences, positive characteristics, positive aspects of the relationship, and how these things may be remembered. Target Date: 2024-12-04 Frequency: Monthly  Progress: 0 Modality: individual  Related Interventions Ask the client to discuss and/or list the positive aspects of and memories about his/her relationship with the lost loved one; reinforce the client's expression of positive memories and emotions (e.g., smiling, laughing); encourage the client to share these thoughts with supportive loved ones. Objective Express thoughts and feelings to the cognitively impaired spouse while he is still capable of comprehending Target Date: 2024-12-04 Frequency: Monthly  Progress: 0 Modality: individual  Related Interventions Ask the client to write a letter to the lost person describing his/her fond memories and/or painful and regretful memories, and how he/she currently feels life (or assign Dear : A Letter to a Lost Loved One in the Adult Psychotherapy Homework Planner by Jenniffer); process the letter in session. Objective Reengage in activities with family, friends, coworkers, and others. Target Date: 2024-12-04 Frequency: Monthly  Progress: 0 Modality: individual  Related Interventions Assist the client in recommitting and reengaging in the primary social positive roles in which he/she has functioned prior to the loss. Promote behavioral activation by assisting the client in listing activities which he/she previously enjoyed but has not engaged in since experiencing the loss and then encourage reengagement in these activities (or assign Identify and Schedule Pleasant Activities in the Adult Psychotherapy Homework Planner by Jenniffer). Objective Acknowledge dependency on husband and begin to refocus life on independent actions to meet emotional needs. Target Date: 2024-12-04 Frequency: Monthly  Progress: 0 Modality: individual   Related Interventions Explore the feelings of anger or guilt that surround the loss, helping the client understand the sources for such feelings. Assist the client in identifying how he/she depended upon the significant other,  expressing and resolving the accompanying feelings of abandonment and of being left alone. 5. Begin the process of reengaging a relationship with adult son. Objective Family members report a desire for and vision of a new sense of connectedness. Target Date: 2024-12-04 Frequency: Monthly  Progress: 0 Modality: individual  Objective Describe the conflicts and the causes of conflicts between self and adult son. Target Date: 2024-12-04 Frequency: Monthly  Progress: 0 Modality: individual  Related Interventions Give verbal permission for the client to have and express own feelings, thoughts, and perspectives in order to foster a sense of autonomy from family. Explore the nature of the client's family conflicts and their perceived causes. Objective Identify own as well as others' role in the family conflicts. Target Date: 2024-12-04 Frequency: Monthly  Progress: 0 Modality: individual  Related Interventions Confront the client when he/she is not taking responsibility for his/her role in the family conflict and reinforce the client for owning responsibility for his/her contribution to the conflict. Objective Identify ways in which the patient's interactions with son could be tempered with assertiveness. Target Date: 2024-12-04 Frequency: Monthly  Progress: 0 Modality: individual  Related Interventions Assist the parents in identifying areas that need strengthening in their parental team, then work with them to strengthen these areas (or assign Learning to Parent as a Team in the Adult Games Developer by [employer]). Objective Report an increase in resolving conflicts with her adult son by talking calmly and assertively rather than aggressively and  defensively. Target Date: 2024-12-04 Frequency: Monthly  Progress: 0 Modality: individual  Related Interventions Use role-playing, role reversal, modeling, and behavioral rehearsal to help the client develop assertive ways to resolve conflict with parents (recommend Your Perfect Right: Assertiveness and Equality in Your Life and Relationships by Darrelyn armin Sick). 6. Complete the process of accepting husband's impairment and expected loss of memory. 7. Develop an awareness of how the avoidance of grieving has affected life and begin the healing process. 8. Develop social and recreational activities as a routine part of life. 9. Enhance ability to effectively cope with the full variety of life's worries and anxieties. 10. Formulate and implement a new life attitudinal pattern that allows for a more relaxed pattern of living. Objective Learn and implement calming skills as a lifestyle change and to manage pressure situations. Target Date: 2024-12-04 Frequency: Monthly  Progress: 0 Modality: individual  Related Interventions Assign the client to implement calming techniques in his/her daily life in general and when facing trigger situations; process the results, reinforcing success and provide corrective feedback toward improvement. Objective Verbalize a commitment to learning new approaches managing self, time, and relationships that emphasize the values of inner and other orientation. Target Date: 2024-12-04 Frequency: Monthly  Progress: 0 Modality: individual  Related Interventions Ask the client to commit to attempting attitude and behavior changes to promote a healthier, less Type A lifestyle; explore with him/her what changes need to be made to become less Type A. Objective Learn and implement respectful assertive communication knowledge and skills to replace insensitive directness or verbal aggression that is controlling. Target Date: 2024-12-04 Frequency: Monthly  Progress: 0  Modality: individual  Related Interventions Monitor, point out, and reframe the client's actions or verbalizations that reflect a self-centered or critical approach to others; practice alternatives using behavioral strategies such as modeling, role-playing, and/or role reversal. Train the client in assertive communication with emphasis on recognizing and refraining from aggressive communication (e.g., ignoring of the rights of others) to respectful, assertive communication. Objective Learn problem-solving and/or  conflict resolution skills to manage interpersonal problems. Target Date: 2024-12-04 Frequency: Monthly  Progress: 0 Modality: individual  Related Interventions Teach the client conflict resolution skills (e.g., empathy, active listening, I messages, respectful communication, assertiveness without aggression, compromise); use role- play and modeling to apply these skills to current conflicts. Teach the client problem-solving skills (e.g., define the problem specifically, brainstorm options, list the pros and cons of each option, chose and implement an option, evaluate the outcome); use modeling, role-playing, and behavior rehearsal to apply this skill to several current conflicts (or assign Plan Before Acting in the Adult Psychotherapy Homework Planner by Jenniffer). Objective Demonstrate decreased impatience with others by talking of appreciating and understanding the good qualities in others. Target Date: 2024-12-04 Frequency: Monthly  Progress: 0 Modality: individual  Related Interventions Assign the client to talk to an associate or child, focusing on listening to the other person and learning several good things about that person; process the experience. 11. Learn and implement coping skills that result in a reduction of anxiety and worry, and improved daily functioning. 12. Stabilize anxiety level while increasing ability to function on a daily basis. Objective Verbalize an  understanding of the cognitive, physiological, and behavioral components of anxiety and its treatment. Target Date: 2024-12-04 Frequency: Monthly  Progress: 0 Modality: individual  Related Interventions Discuss how generalized anxiety typically involves excessive worry about unrealistic threats, various bodily expressions of tension, overarousal, and hypervigilance, and avoidance of what is threatening that interact to maintain the problem (see Mastery of Your Anxiety and Worry: Therapist Guide by [place]enson [place], and Barlow; Treating Generalized Anxiety Disorder by Rygh and Red). Discuss how treatment targets worry, anxiety symptoms, and avoidance to help the client manage worry effectively, reduce overarousal, and eliminate unnecessary avoidance. Objective Learn and implement calming skills to reduce overall anxiety and manage anxiety symptoms. Target Date: 2024-12-04 Frequency: Monthly  Progress: 0 Modality: individual  Related Interventions Teach the client calming/relaxation skills (e.g., applied relaxation, progressive muscle relaxation, cue controlled relaxation; mindful breathing; biofeedback) and how to discriminate better between relaxation and tension; teach the client how to apply these skills to his/her daily life (e.g., New Directions in Progressive Muscle Relaxation by Thornell Collier, and Hazlett-Stevens; Treating Generalized Anxiety Disorder by Rygh and Red). Assign the client to read about progressive muscle relaxation and other calming strategies in relevant books or treatment manuals (e.g., Progressive Relaxation Training by Thornell and Collier; Mastery of Your Anxiety and Worry: Workbook by River armin Given). Objective Verbalize an understanding of the role that cognitive biases play in excessive irrational worry and persistent anxiety symptoms. Target Date: 2024-12-04 Frequency: Monthly  Progress: 0 Modality: individual  Related Interventions Discuss  examples demonstrating that unrealistic worry typically overestimates the probability of threats and underestimates or overlooks the client's ability to manage realistic demands (or assign Past Successful Anxiety Coping in the Adult Psychotherapy Homework Planner by Jenniffer). Assist the client in analyzing his/her worries by examining potential biases such as the probability of the negative expectation occurring, the real consequences of it occurring, his/her ability to control the outcome, the worst possible outcome, and his/her ability to accept it (see Analyze the Probability of a Feared Event in the Adult Psychotherapy Homework Planner by Jenniffer; Cognitive Therapy of Anxiety Disorders by Gretta armin Mon). Objective Learn to accept limitations in life and commit to tolerating, rather than avoiding, unpleasant emotions while accomplishing meaningful goals. Target Date: 2024-12-04 Frequency: Monthly  Progress: 0 Modality: individual  Related Interventions Use techniques from Acceptance and Commitment Therapy  to help client accept uncomfortable realities such as lack of complete control, imperfections, and uncertainty and tolerate unpleasant emotions and thoughts in order to accomplish value-consistent goals. Objective Reestablish a consistent sleep-wake cycle. Target Date: 2024-12-04 Frequency: Monthly  Progress: 0 Modality: individual  Related Interventions Teach and implement sleep hygiene practices to help the client reestablish a consistent sleep-wake cycle; review, reinforce success, and provide corrective feedback toward improvement.  Diagnosis:Adjustment disorder with mixed anxiety and depressed mood  Plan:  -find a trust attorney -got to play [employer] with or without Ryan -pt is going to try and plan for caregiver time to free her up -meet again on Friday, February 06, 2024 at 10am in-person

## 2024-02-17 ENCOUNTER — Ambulatory Visit

## 2024-02-17 DIAGNOSIS — R911 Solitary pulmonary nodule: Secondary | ICD-10-CM

## 2024-02-19 ENCOUNTER — Ambulatory Visit: Admitting: Professional

## 2024-02-24 ENCOUNTER — Other Ambulatory Visit

## 2024-02-24 ENCOUNTER — Ambulatory Visit: Admitting: Acute Care

## 2024-02-24 ENCOUNTER — Ambulatory Visit: Admitting: Internal Medicine

## 2024-02-24 ENCOUNTER — Encounter: Payer: Self-pay | Admitting: Internal Medicine

## 2024-02-24 ENCOUNTER — Encounter: Payer: Self-pay | Admitting: Acute Care

## 2024-02-24 VITALS — BP 132/80 | HR 72 | Ht 60.0 in | Wt 129.0 lb

## 2024-02-24 VITALS — BP 110/72 | HR 80 | Temp 97.9°F | Ht 60.5 in | Wt 129.2 lb

## 2024-02-24 DIAGNOSIS — R9389 Abnormal findings on diagnostic imaging of other specified body structures: Secondary | ICD-10-CM

## 2024-02-24 DIAGNOSIS — R911 Solitary pulmonary nodule: Secondary | ICD-10-CM

## 2024-02-24 DIAGNOSIS — Z853 Personal history of malignant neoplasm of breast: Secondary | ICD-10-CM

## 2024-02-24 DIAGNOSIS — E039 Hypothyroidism, unspecified: Secondary | ICD-10-CM

## 2024-02-24 DIAGNOSIS — R918 Other nonspecific abnormal finding of lung field: Secondary | ICD-10-CM

## 2024-02-24 DIAGNOSIS — Z85028 Personal history of other malignant neoplasm of stomach: Secondary | ICD-10-CM

## 2024-02-24 NOTE — Progress Notes (Signed)
 History of Present Illness Mariah Kemp is a 63 y.o. female  former smoker  with hx of stage 4A gastric cancer and stage 3 right breast cancer who presents to see Dr. Shelah 10/2023 for evaluation of pulmonary nodules. She was referred by her primary care doctor for further evaluation of her lung nodules.   Pt. With history significant of gastric cancer and breast cancer referred for right upper lobe pulmonary nodules 10/2023. She underwent a biopsy and bronchoscopy on 11/10/2023. These were negative for malignancy. 3 month follow up Ct Chest was ordered for continued surveillance. She is here today to follow up on her scan results.    02/24/2024 Discussed the use of AI scribe software for clinical note transcription with the patient, who gave verbal consent to proceed.  History of Present Illness Mariah Kemp is a 63 year old female with bronchiectasis who presents for a follow-up to review Ct chest results.  Mariah Kemp states she  has not experienced any recent illnesses or breathing issues, but she has been using her inhaler more frequently than usual, attributed to being indoors in dry heat. She does not experience significant coughing, except for a slight need to cough up clear mucus in the mornings.  A recent scan showed changes that were discussed with the patient. There is notation of bronchiectasis.We discussed that this is a chronic lung condition where airways become permanently widened and damaged, making it difficult to clear mucus. The damage allows mucus build up, which can trap germs and lead to recurring infections. Symptoms include chronic cough, frequent lung infections, and production of large amounts of sputum.  We will start a flutter valve withj Mucinex to see if we can mobilize secretions.   Nodules are stable around the Fiducial marker in the right upper lobe. She has not experienced shortness of breath.Previous biopsy was negative. We will do a 6 month follow up scan  to re-evaluate the nodules. We discussed that she needs to call to be seen sooner for unintentional weight loss or hemoptysis.  She has not had any recent infections, and her mucus remains clear. She has not received a pneumococcal vaccine and has never had a flu shot. Both were recommended , but she prefers not to take them.       Test Results: 02/17/2024  Upper and midlung zone predominant peribronchovascular nodularity and nodular consolidation with mild coarsening, bronchiectasis and mild peribronchial thickening, as before. Fiducial marker in the right upper lobe related to biopsy 11/10/2023, at which time no malignant cells were identified. No pleural fluid. Airway is unremarkable.  Upper and midlung zone predominant peribronchovascular nodularity and nodular consolidation with mild coarsening, bronchiectasis and mild peribronchial thickening, as before. Negative right upper nodule biopsy 11/10/2023. Findings may be due to organizing pneumonia or sarcoid. 2. Total gastrectomy. 3. Aortic atherosclerosis (ICD10-I70.0). Coronary artery calcification.  Cytology 11/10/2023 A. LUNG, RUL TARGET #1, NEEDLE ASPIRATIONS  BIOPSIES:  - No malignant cells identified. See Comment.   B. LUNG, RUL TARGET #2, NEEDLE ASPIRATIONS  BIOPSIES:  - No malignant cells identified. See Comment.    BAL No Growth Aerobic/ Anaerobic No Growth   AFB Negative   Fungus>> No fungus observed    09/29/2023 CT Chest Dominant right upper lobe nodule seen on previous exams has an increased solid component on today's study measuring up to 13 mm in average diameter. This interval growth is suspicious and referral to multi disciplinary pulmonary group is recommended. Further imaging by means of  PET-CT should be considered.    07/16/2021 CT Chest No evidence of new or progressive disease in the chest, abdomen or pelvis. 2. No significant interval change in the multifocal bilateral pulmonary nodules,  stable over multiple priors and most consistent with a benign infectious or inflammatory etiology. Continued attention on follow-up imaging suggested. 3.  Aortic Atherosclerosis (ICD10-I70.0).    Latest Ref Rng & Units 11/10/2023    9:50 AM 09/18/2023    2:09 PM 09/16/2022    3:02 PM  CBC  WBC 4.0 - 10.5 K/uL 5.2  7.6  7.0   Hemoglobin 12.0 - 15.0 g/dL 88.8  88.2  87.9   Hematocrit 36.0 - 46.0 % 33.3  35.9  35.5   Platelets 150 - 400 K/uL 161  175  210        Latest Ref Rng & Units 12/05/2023   12:16 PM 11/10/2023    9:50 AM 09/18/2023    2:09 PM  BMP  Glucose 70 - 99 mg/dL 75  87  80   BUN 8 - 27 mg/dL 10  8  11    Creatinine 0.57 - 1.00 mg/dL 9.51  9.51  9.52   BUN/Creat Ratio 12 - 28 21   23    Sodium 134 - 144 mmol/L 141  140  139   Potassium 3.5 - 5.2 mmol/L 3.9  3.5  4.3   Chloride 96 - 106 mmol/L 104  106  101   CO2 20 - 29 mmol/L 24  25  22    Calcium  8.7 - 10.3 mg/dL 9.4  9.2  9.3     BNP    Component Value Date/Time   BNP 45 03/27/2018 1512    ProBNP No results found for: PROBNP  PFT No results found for: FEV1PRE, FEV1POST, FVCPRE, FVCPOST, TLC, DLCOUNC, PREFEV1FVCRT, PSTFEV1FVCRT  CT CHEST WO CONTRAST Result Date: 02/23/2024 CLINICAL DATA:  Gastric cancer and right breast cancer, lung nodule. * Tracking Code: BO * EXAM: CT CHEST WITHOUT CONTRAST TECHNIQUE: Multidetector CT imaging of the chest was performed following the standard protocol without IV contrast. RADIATION DOSE REDUCTION: This exam was performed according to the departmental dose-optimization program which includes automated exposure control, adjustment of the mA and/or kV according to patient size and/or use of iterative reconstruction technique. COMPARISON:  09/29/2023, 07/16/2021. FINDINGS: Cardiovascular: Atherosclerotic calcification of the aorta, aortic valve and coronary arteries. Heart size normal. No pericardial effusion. Mediastinum/Nodes: No pathologically enlarged mediastinal or  axillary lymph nodes. Hilar regions are difficult to definitively evaluate without IV contrast. Right breast reconstruction with surgical clips in the right axilla. Esophagus is grossly unremarkable. Lungs/Pleura: Upper and midlung zone predominant peribronchovascular nodularity and nodular consolidation with mild coarsening, bronchiectasis and mild peribronchial thickening, as before. Fiducial marker in the right upper lobe related to biopsy 11/10/2023, at which time no malignant cells were identified. No pleural fluid. Airway is unremarkable. Upper Abdomen: Total gastrectomy. Visualized portions of the liver, gallbladder, adrenal glands, kidneys, spleen, pancreas, stomach and bowel are otherwise grossly unremarkable. No upper abdominal adenopathy. Musculoskeletal: Osteopenia.  Degenerative changes in the spine. IMPRESSION: 1. Upper and midlung zone predominant peribronchovascular nodularity and nodular consolidation with mild coarsening, bronchiectasis and mild peribronchial thickening, as before. Negative right upper nodule biopsy 11/10/2023. Findings may be due to organizing pneumonia or sarcoid. 2. Total gastrectomy. 3. Aortic atherosclerosis (ICD10-I70.0). Coronary artery calcification. Electronically Signed   By: Newell Eke M.D.   On: 02/23/2024 15:24     Past medical hx Past Medical History:  Diagnosis  Date   Anemia    Asthma    Blood transfusion without reported diagnosis    Breast cancer (HCC)    Depression    Dyspnea    over exertion   Gastrointestinal hemorrhage    Hypertension    Hypothyroidism    Stomach cancer (HCC)      Social History   Tobacco Use   Smoking status: Former    Current packs/day: 0.00    Average packs/day: 0.5 packs/day for 15.0 years (7.5 ttl pk-yrs)    Types: Cigarettes    Start date: 04/28/1979    Quit date: 04/27/1994    Years since quitting: 29.8    Passive exposure: Past   Smokeless tobacco: Never  Vaping Use   Vaping status: Never Used   Substance Use Topics   Alcohol use: No    Alcohol/week: 0.0 standard drinks of alcohol   Drug use: No    MariahFarinas reports that she quit smoking about 29 years ago. Her smoking use included cigarettes. She started smoking about 44 years ago. She has a 7.5 pack-year smoking history. She has been exposed to tobacco smoke. She has never used smokeless tobacco. She reports that she does not drink alcohol and does not use drugs.  Tobacco Cessation: Counseling given: Not Answered Former smoker   Past surgical hx, Family hx, Social hx all reviewed.  Current Outpatient Medications on File Prior to Visit  Medication Sig   acyclovir  (ZOVIRAX ) 400 MG tablet Take 1 tablet (400 mg total) by mouth 3 (three) times daily as needed. For five days. As needed for cold sore.   albuterol  (VENTOLIN  HFA) 108 (90 Base) MCG/ACT inhaler inhale 1-2 puffs into the lungs every 4 hours if needed for wheezing or shortness of breath.   atorvastatin  (LIPITOR) 10 MG tablet Take 1 tablet (10 mg total) by mouth daily.   cyanocobalamin  (VITAMIN B12) 1000 MCG/ML injection Inject 1 mL (1,000 mcg total) into the muscle once a week.   levothyroxine  (SYNTHROID ) 150 MCG tablet Take 1 tablet (150 mcg total) by mouth as directed. 2 tabs on Sundays and 1 tablet rest of the week   liothyronine  (CYTOMEL ) 5 MCG tablet Take 1 tablet (5 mcg total) by mouth in the morning and at bedtime.   mometasone -formoterol  (DULERA) 100-5 MCG/ACT AERO Inhale 2 puffs into the lungs 2 (two) times daily.   Needles & Syringes MISC 25 ga x 1  IM needle/syringe   sertraline  (ZOLOFT ) 100 MG tablet Take 1 tablet (100 mg total) by mouth daily.   SYRINGE-NEEDLE, DISP, 3 ML (B-D 3CC LUER-LOK SYR 25GX1) 25G X 1 3 ML MISC USE AS DIRECTED ONCE A WEEK   TYMLOS 3120 MCG/1.56ML SOPN Inject 80 mcg into the skin.   Vitamin D, Ergocalciferol, (DRISDOL) 1.25 MG (50000 UNIT) CAPS capsule Take 50,000 Units by mouth once a week.   No current facility-administered  medications on file prior to visit.     No Known Allergies  Review Of Systems:  Constitutional:   No  weight loss, night sweats,  Fevers, chills, fatigue, or  lassitude.  HEENT:   No headaches,  Difficulty swallowing,  Tooth/dental problems, or  Sore throat,                No sneezing, itching, ear ache, nasal congestion, post nasal drip,   CV:  No chest pain,  Orthopnea, PND, swelling in lower extremities, anasarca, dizziness, palpitations, syncope.   GI  No heartburn, indigestion, abdominal pain, nausea, vomiting, diarrhea, change  in bowel habits, loss of appetite, bloody stools.   Resp: No shortness of breath with exertion or at rest.  No excess mucus, no productive cough,  No non-productive cough,  No coughing up of blood.  No change in color of mucus.  No wheezing.  No chest wall deformity  Skin: no rash or lesions.  GU: no dysuria, change in color of urine, no urgency or frequency.  No flank pain, no hematuria   MS:  No joint pain or swelling.  No decreased range of motion.  No back pain.  Psych:  No change in mood or affect. No depression or anxiety.  No memory loss.   Vital Signs BP 110/72   Pulse 80   Temp 97.9 F (36.6 C) (Oral)   Ht 5' 0.5 (1.537 m)   Wt 129 lb 3.2 oz (58.6 kg)   SpO2 97%   BMI 24.82 kg/m    Physical Exam: General- No distress,  A&Ox3, pleasant ENT: No sinus tenderness, TM clear, pale nasal mucosa, no oral exudate,no post nasal drip, no LAN Cardiac: S1, S2, regular rate and rhythm, no murmur Chest: No wheeze/ rales/ dullness; no accessory muscle use, no nasal flaring, no sternal retractions, diminished per bases, diminished per bases Abd.: Soft Non-tender, ND, BS +, Body mass index is 24.82 kg/m.  Ext: No clubbing cyanosis, edema, no obvious deformities Neuro:  normal strength, MAE x 4, A&L x 3, appropriate Skin: No rashes, warm and dry, no obvious skin lesions  Psych: normal mood and behavior     Physical Exam     Assessment/Plan Assessment & Plan Bronchiectasis Chronic bronchiectasis with mucus-filled enlarged airways, likely post-infectious. Asymptomatic. Discussed increased lung infection risk. - Initiated Mucinex 600 mg daily with water to thin mucus. - Introduced flutter valve for airway clearance, provided usage and cleaning instructions. - Educated on 1200 mg tablet dosing. - Discussed pneumococcal and influenza vaccination benefits.  Multiple lung nodules Previous biopsy negative. Current scan shows fiducial marker near biopsy site. No new symptoms. - Scheduled follow-up chest CT in six months to monitor nodules and assess bronchiectasis improvement. Follow up with me 1-2 weeks after the scan.   I spent 20 minutes dedicated to the care of this patient on the date of this encounter to include pre-visit review of records, face-to-face time with the patient discussing conditions above, post visit ordering of testing, clinical documentation with the electronic health record, making appropriate referrals as documented, and communicating necessary information to the patient's healthcare team.      Lauraine JULIANNA Lites, NP 02/24/2024  1:53 PM

## 2024-02-24 NOTE — Patient Instructions (Signed)

## 2024-02-24 NOTE — Progress Notes (Unsigned)
 Name: Mariah Kemp  MRN/ DOB: [id number], [birth year]    Age/ Sex: 63 y.o., female     PCP: Willo Mini, NP   Reason for Endocrinology Evaluation: Hypothyroidism     Initial Endocrinology Clinic Visit: 07/19/2020    PATIENT IDENTIFIER: Mariah Kemp is a 63 y.o., female with a past medical history of Hx of breast Ca ( S/P right mastectomy ) , Hx of gastric cancer, HTn and hypothyroidism. She has followed with [facility] since 07/19/2020 for consultative assistance with management of her Hypothyroidism.   HISTORICAL SUMMARY:  She has been diagnosed with gastric cancer in 2020. S/P chemo from 1-07/2018 followed by total gastrectomy 09/2018.  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      She has been diagnosed with Hypothyroidism ~ 30 yrs ago . She has been on LT-4 for years.    Started liothyronine  11/2021 due to variable absorption of levothyroxine   No FH of thyroid  disease  SUBJECTIVE:    Today (02/24/2024):  Mariah Kemp is here for hypothyroidism.   Patient follows up with behavioral health She was evaluated for a rash to urgent care, thought to be tenia, was prescribed steroid antifungal topical. 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  She is s/p bronchoscopy in August, 2025 no malignant cells  She is on Tymlos through a [facility] specialist   Patient been noted with slight weight gain since her last visit here No local neck swelling  Occasional palpitations  No constipation or diarrhea  Energy level is stable     She is on  Biotin, on hold  She is on MVI , she is also on calcium  and Vitamin D3  Levothyroxine  150 mcg, 2 tabs on Sundays and 1 tab rest of the week- Continues with 1 tablet daily  Liothyronine  5 mcg BID   HISTORY:  Past Medical History:  Past Medical History:  Diagnosis Date   Anemia    Asthma    Blood transfusion without reported diagnosis     Breast cancer (HCC)    Depression    Dyspnea    over exertion   Gastrointestinal hemorrhage    Hypertension    Hypothyroidism    Stomach cancer (HCC)    Past Surgical History:  Past Surgical History:  Procedure Laterality Date   ABDOMINAL HYSTERECTOMY  1996   BARIATRIC SURGERY     stomach removal   BILATERAL SALPINGOOPHORECTOMY  2007   BIOPSY  03/29/2018   Procedure: BIOPSY;  Surgeon: Dianna Specking, MD;  Location: [facility] ENDOSCOPY;  Service: Endoscopy;;   CARPAL TUNNEL RELEASE     COLONOSCOPY N/A 03/29/2018   Procedure: COLONOSCOPY;  Surgeon: Dianna Specking, MD;  Location: [facility] ENDOSCOPY;  Service: Endoscopy;  Laterality: N/A;   COLONOSCOPY     ESOPHAGOGASTRODUODENOSCOPY N/A 03/29/2018   Procedure: ESOPHAGOGASTRODUODENOSCOPY (EGD);  Surgeon: Dianna Specking, MD;  Location: [facility] ENDOSCOPY;  Service: Endoscopy;  Laterality: N/A;   GASTRECTOMY N/A 10/02/2018   Procedure: TOTAL GASTRECTOMY;  Surgeon: Aron Shoulders, MD;  Location: [facility] OR;  Service: General;  Laterality: N/A;   GASTROSTOMY N/A 10/02/2018   Procedure: FEEDING TUBE PLACEMENT;  Surgeon: Aron Shoulders, MD;  Location: [facility] OR;  Service: General;  Laterality: N/A;   LAPAROSCOPY N/A 04/13/2018   Procedure: LAPAROSCOPY DIAGNOSTIC;  Surgeon: Aron Shoulders, MD;  Location: [facility] ORS;  Service: General;  Laterality: N/A;   LAPAROSCOPY N/A 10/02/2018   Procedure: LAPAROSCOPY DIAGNOSTIC;  Surgeon: Aron Shoulders,  MD;  Location: [facility] OR;  Service: General;  Laterality: N/A;   MASTECTOMY Right 2007   PORT-A-CATH REMOVAL Left 11/07/2020   Procedure: PORT REMOVAL;  Surgeon: Aron Shoulders, MD;  Location: [facility] OR;  Service: General;  Laterality: Left;   PORTACATH PLACEMENT Left 04/13/2018   Procedure: INSERTION PORT-A-CATH ERAS PATHWAY;  Surgeon: Aron Shoulders, MD;  Location: [facility] ORS;  Service: General;  Laterality: Left;  subclavian   UPPER GASTROINTESTINAL ENDOSCOPY     VIDEO BRONCHOSCOPY WITH ENDOBRONCHIAL NAVIGATION Right 11/10/2023   Procedure:  VIDEO BRONCHOSCOPY WITH ENDOBRONCHIAL NAVIGATION;  Surgeon: Shelah Lamar RAMAN, MD;  Location: [facility] ENDOSCOPY;  Service: Pulmonary;  Laterality: Right;   Social History:  reports that she quit smoking about 29 years ago. Her smoking use included cigarettes. She started smoking about 44 years ago. She has a 7.5 pack-year smoking history. She has been exposed to tobacco smoke. She has never used smokeless tobacco. She reports that she does not drink alcohol and does not use drugs. Family History:  Family History  Problem Relation Age of Onset   Liver cancer Father    Cancer Paternal Uncle        unknown type cancer   Cirrhosis Mother    Colon cancer Neg Hx    Esophageal cancer Neg Hx    Rectal cancer Neg Hx    Stomach cancer Neg Hx      HOME MEDICATIONS: Allergies as of 02/24/2024   No Known Allergies      Medication List        Accurate as of February 24, 2024 11:37 AM. If you have any questions, ask your nurse or doctor.          acyclovir  400 MG tablet Commonly known as: Zovirax  Take 1 tablet (400 mg total) by mouth 3 (three) times daily as needed. For five days. As needed for cold sore.   albuterol  108 (90 Base) MCG/ACT inhaler Commonly known as: VENTOLIN  HFA inhale 1-2 puffs into the lungs every 4 hours if needed for wheezing or shortness of breath.   atorvastatin  10 MG tablet Commonly known as: LIPITOR Take 1 tablet (10 mg total) by mouth daily.   B-D 3CC LUER-LOK SYR 25GX1 25G X 1 3 ML Misc Generic drug: SYRINGE-NEEDLE (DISP) 3 ML USE AS DIRECTED ONCE A WEEK   clobetasol ointment 0.05 % Commonly known as: TEMOVATE Apply 1 Application topically 2 (two) times daily.   clotrimazole -betamethasone  cream Commonly known as: LOTRISONE  Apply to affected area 2 times daily until clears   cyanocobalamin  1000 MCG/ML injection Commonly known as: VITAMIN B12 Inject 1 mL (1,000 mcg total) into the muscle once a week.   estradiol 0.1 MG/GM vaginal cream Commonly known  as: ESTRACE Place 1 Applicatorful vaginally 2 (two) times daily.   levothyroxine  150 MCG tablet Commonly known as: SYNTHROID  Take 1 tablet (150 mcg total) by mouth as directed. 2 tabs on Sundays and 1 tablet rest of the week   liothyronine  5 MCG tablet Commonly known as: CYTOMEL  Take 1 tablet (5 mcg total) by mouth in the morning and at bedtime.   mometasone -formoterol  100-5 MCG/ACT Aero Commonly known as: DULERA Inhale 2 puffs into the lungs 2 (two) times daily.   Needles & Syringes Misc 25 ga x 1  IM needle/syringe   sertraline  100 MG tablet Commonly known as: ZOLOFT  Take 1 tablet (100 mg total) by mouth daily.   Tymlos 3120 MCG/1.56ML Sopn Generic drug: Abaloparatide Inject 80 mcg into the skin.   Vitamin  D (Ergocalciferol) 1.25 MG (50000 UNIT) Caps capsule Commonly known as: DRISDOL Take 50,000 Units by mouth once a week.          OBJECTIVE:   PHYSICAL EXAM: VS: BP 132/80   Pulse 72   Ht 5' (1.524 m)   Wt 129 lb (58.5 kg)   SpO2 96%   BMI 25.19 kg/m     EXAM: General: Pt appears well and is in NAD  Neck:  Thyroid : Thyroid  size normal.  No goiter or nodules appreciated.  Lungs: Clear with good BS bilat s  Heart: Auscultation: RRR.  Extremities:  BL LE: No pretibial edema   Mental Status: Judgment, insight: Intact Memory: Intact for recent and remote events Mood and affect: No depression, anxiety, or agitation     DATA REVIEWED:  *****  ASSESSMENT / PLAN / RECOMMENDATIONS:   Hypothyroidism:  -Patient is clinically euthyroid -Patient with variable LT-4 absorption , hence fluctuating TFTs - In October I had increased levothyroxine  to 8 tablets a week, but she has forgotten and has been taking 1 tablet daily of levothyroxine  150 mcg dose.   Medications   levothyroxine  150 mcg daily Continue liothyronine  5 mcg BID   Follow-up in 6 months Labs in 2 months      Signed electronically by: Stefano Redgie Butts, MD  [facility]  Endocrinology  [facility] [address] [place] [place], [place] [address] Phone: [phone] FAX: [phone]      CC: Willo Mini, NP [address] [place] [address] Phone: [phone]  Fax: [phone]   Return to Endocrinology clinic as below: Future Appointments  Date Time Provider Department Center  02/24/2024  2:00 PM Ruthell Lauraine FALCON, NP [facility] [address]  03/26/2024  9:00 AM [facility], [facility] None

## 2024-02-24 NOTE — Patient Instructions (Addendum)
 It is good to see you today. We have reviewed your scan.  You have some bronchiectasis , which is a term that means that your airways are bigger and more dilated than they are supposed to be.  This finding was discovered on the CT scan of your chest. It puts you at higher risk of respiratory infection. We will start Mucinex 1200 mg daily with a full glass of water.  We will also give you a flutter valve to see if we can mobilize secretions. Do 4-6 blows at a time several times a day with chest congestion.  We will do a 6 month follow up scan of your chest to monitor for nodules and to see if you have any improvement in the bronchiectasis.  Call if you need us  sooner  Please contact office for sooner follow up if symptoms do not improve or worsen or seek emergency care

## 2024-02-25 ENCOUNTER — Telehealth: Payer: Self-pay | Admitting: Internal Medicine

## 2024-02-25 ENCOUNTER — Ambulatory Visit: Payer: Self-pay | Admitting: Internal Medicine

## 2024-02-25 LAB — TSH: TSH: 14.01 m[IU]/L — ABNORMAL HIGH (ref 0.40–4.50)

## 2024-02-25 LAB — T4, FREE: Free T4: 0.7 ng/dL — ABNORMAL LOW (ref 0.8–1.8)

## 2024-02-25 MED ORDER — LIOTHYRONINE SODIUM 5 MCG PO TABS: 5.0000 ug | ORAL_TABLET | Freq: Two times a day (BID) | ORAL | 3 refills | Status: AC

## 2024-02-25 MED ORDER — LEVOTHYROXINE SODIUM 175 MCG PO TABS: 175.0000 ug | ORAL_TABLET | Freq: Every day | ORAL | 6 refills | Status: AC

## 2024-02-25 NOTE — Telephone Encounter (Signed)
Can you please contact the patient and schedule her for a lab appointment only in 2 months?   Thank you

## 2024-03-26 ENCOUNTER — Encounter: Payer: Self-pay | Admitting: Professional

## 2024-03-26 ENCOUNTER — Encounter: Payer: Self-pay | Admitting: Hematology

## 2024-03-26 ENCOUNTER — Ambulatory Visit: Admitting: Professional

## 2024-03-26 DIAGNOSIS — F4323 Adjustment disorder with mixed anxiety and depressed mood: Secondary | ICD-10-CM

## 2024-03-26 NOTE — Progress Notes (Signed)
 "   [facility] Counselor/Therapist Progress Note  Patient ID: Mariah Kemp, MRN: [id number],    Date: 03/26/2024  Time Spent: 50 minutes 909-1159am  Treatment Type: Individual Therapy  Risk Assessment: Danger to Self:  No Self-injurious Behavior: No Danger to Others: No  Subjective: The patient arrived on time for her in-person appointment.  Issues addressed: 1-mood -pleasant, easily engaged, denies feeling depressed 2-husband and caregiving -pt reports her husband is losing memory -he does not become angry and appears more accepting -he is easily confused and thinks his grandson (gs) stole a beer from the fridge despite gs not being in their home -pt admits that days of her leaving him are likely over due to her concerns for his safety -she is trying to keep him active and attending some community events [employer] for [employer]) -pt is not longer annoyed by his behavior and appears more accepting and can see some of the humor (lost bath trash can- found behind chair in bedroom)  Treatment Plan Problems Addressed  Anxiety, Family Conflict, Grief / Loss Unresolved, Type A Behavior Goals 1. Achieve an overall decrease in pressured, driven behaviors. 2. Alleviate sense of time urgency, free-floating anxiety, anger, and self-destructive behaviors. 3. Become a reconstituted/blended family unit that is functional and whose members are bonded to each other. 4. Begin a healthy grieving process around husband's Alzheimer's diagnosis. Objective Attend an Alzheimer's support group. Target Date: 2024-12-04 Frequency: Monthly  Progress: 0 Modality: individual  Related Interventions Ask the client to attend a grief/loss support group and report to the therapist how he/she felt about attending. Objective Verbalize resolution of feelings of guilt and regret associated with cognitively impaired spouse's condition. Target Date: 2024-12-04 Frequency: Monthly  Progress: 0  Modality: individual  Related Interventions Assign the client to make a list of all the regrets associated with actions toward or relationship with the deceased; process the list content toward resolution of these feelings. Objective Identify and voice positives about the cognitively impaired spouse including previous positive experiences, positive characteristics, positive aspects of the relationship, and how these things may be remembered. Target Date: 2024-12-04 Frequency: Monthly  Progress: 0 Modality: individual  Related Interventions Ask the client to discuss and/or list the positive aspects of and memories about his/her relationship with the lost loved one; reinforce the client's expression of positive memories and emotions (e.g., smiling, laughing); encourage the client to share these thoughts with supportive loved ones. Objective Express thoughts and feelings to the cognitively impaired spouse while he is still capable of comprehending Target Date: 2024-12-04 Frequency: Monthly  Progress: 0 Modality: individual  Related Interventions Ask the client to write a letter to the lost person describing his/her fond memories and/or painful and regretful memories, and how he/she currently feels life (or assign Dear : A Letter to a Lost Loved One in the Adult Psychotherapy Homework Planner by Jenniffer); process the letter in session. Objective Reengage in activities with family, friends, coworkers, and others. Target Date: 2024-12-04 Frequency: Monthly  Progress: 0 Modality: individual  Related Interventions Assist the client in recommitting and reengaging in the primary social positive roles in which he/she has functioned prior to the loss. Promote behavioral activation by assisting the client in listing activities which he/she previously enjoyed but has not engaged in since experiencing the loss and then encourage reengagement in these activities (or assign Identify and Schedule Pleasant  Activities in the Adult Psychotherapy Homework Planner by Jenniffer). Objective Acknowledge dependency on husband and begin to refocus life on independent  actions to meet emotional needs. Target Date: 2024-12-04 Frequency: Monthly  Progress: 0 Modality: individual  Related Interventions Explore the feelings of anger or guilt that surround the loss, helping the client understand the sources for such feelings. Assist the client in identifying how he/she depended upon the significant other, expressing and resolving the accompanying feelings of abandonment and of being left alone. 5. Begin the process of reengaging a relationship with adult son. Objective Family members report a desire for and vision of a new sense of connectedness. Target Date: 2024-12-04 Frequency: Monthly  Progress: 0 Modality: individual  Objective Describe the conflicts and the causes of conflicts between self and adult son. Target Date: 2024-12-04 Frequency: Monthly  Progress: 0 Modality: individual  Related Interventions Give verbal permission for the client to have and express own feelings, thoughts, and perspectives in order to foster a sense of autonomy from family. Explore the nature of the client's family conflicts and their perceived causes. Objective Identify own as well as others' role in the family conflicts. Target Date: 2024-12-04 Frequency: Monthly  Progress: 0 Modality: individual  Related Interventions Confront the client when he/she is not taking responsibility for his/her role in the family conflict and reinforce the client for owning responsibility for his/her contribution to the conflict. Objective Identify ways in which the patient's interactions with son could be tempered with assertiveness. Target Date: 2024-12-04 Frequency: Monthly  Progress: 0 Modality: individual  Related Interventions Assist the parents in identifying areas that need strengthening in their parental team, then work with them  to strengthen these areas (or assign Learning to Parent as a Team in the Adult Games Developer by [employer]). Objective Report an increase in resolving conflicts with her adult son by talking calmly and assertively rather than aggressively and defensively. Target Date: 2024-12-04 Frequency: Monthly  Progress: 0 Modality: individual  Related Interventions Use role-playing, role reversal, modeling, and behavioral rehearsal to help the client develop assertive ways to resolve conflict with parents (recommend Your Perfect Right: Assertiveness and Equality in Your Life and Relationships by Darrelyn armin Sick). 6. Complete the process of accepting husband's impairment and expected loss of memory. 7. Develop an awareness of how the avoidance of grieving has affected life and begin the healing process. 8. Develop social and recreational activities as a routine part of life. 9. Enhance ability to effectively cope with the full variety of life's worries and anxieties. 10. Formulate and implement a new life attitudinal pattern that allows for a more relaxed pattern of living. Objective Learn and implement calming skills as a lifestyle change and to manage pressure situations. Target Date: 2024-12-04 Frequency: Monthly  Progress: 0 Modality: individual  Related Interventions Assign the client to implement calming techniques in his/her daily life in general and when facing trigger situations; process the results, reinforcing success and provide corrective feedback toward improvement. Objective Verbalize a commitment to learning new approaches managing self, time, and relationships that emphasize the values of inner and other orientation. Target Date: 2024-12-04 Frequency: Monthly  Progress: 0 Modality: individual  Related Interventions Ask the client to commit to attempting attitude and behavior changes to promote a healthier, less Type A lifestyle; explore with him/her what changes need to  be made to become less Type A. Objective Learn and implement respectful assertive communication knowledge and skills to replace insensitive directness or verbal aggression that is controlling. Target Date: 2024-12-04 Frequency: Monthly  Progress: 0 Modality: individual  Related Interventions Monitor, point out, and reframe the client's actions or verbalizations that  reflect a self-centered or critical approach to others; practice alternatives using behavioral strategies such as modeling, role-playing, and/or role reversal. Train the client in assertive communication with emphasis on recognizing and refraining from aggressive communication (e.g., ignoring of the rights of others) to respectful, assertive communication. Objective Learn problem-solving and/or conflict resolution skills to manage interpersonal problems. Target Date: 2024-12-04 Frequency: Monthly  Progress: 0 Modality: individual  Related Interventions Teach the client conflict resolution skills (e.g., empathy, active listening, I messages, respectful communication, assertiveness without aggression, compromise); use role- play and modeling to apply these skills to current conflicts. Teach the client problem-solving skills (e.g., define the problem specifically, brainstorm options, list the pros and cons of each option, chose and implement an option, evaluate the outcome); use modeling, role-playing, and behavior rehearsal to apply this skill to several current conflicts (or assign Plan Before Acting in the Adult Psychotherapy Homework Planner by Jenniffer). Objective Demonstrate decreased impatience with others by talking of appreciating and understanding the good qualities in others. Target Date: 2024-12-04 Frequency: Monthly  Progress: 0 Modality: individual  Related Interventions Assign the client to talk to an associate or child, focusing on listening to the other person and learning several good things about that person;  process the experience. 11. Learn and implement coping skills that result in a reduction of anxiety and worry, and improved daily functioning. 12. Stabilize anxiety level while increasing ability to function on a daily basis. Objective Verbalize an understanding of the cognitive, physiological, and behavioral components of anxiety and its treatment. Target Date: 2024-12-04 Frequency: Monthly  Progress: 0 Modality: individual  Related Interventions Discuss how generalized anxiety typically involves excessive worry about unrealistic threats, various bodily expressions of tension, overarousal, and hypervigilance, and avoidance of what is threatening that interact to maintain the problem (see Mastery of Your Anxiety and Worry: Therapist Guide by Venson River, and Barlow; Treating Generalized Anxiety Disorder by Rygh and Red). Discuss how treatment targets worry, anxiety symptoms, and avoidance to help the client manage worry effectively, reduce overarousal, and eliminate unnecessary avoidance. Objective Learn and implement calming skills to reduce overall anxiety and manage anxiety symptoms. Target Date: 2024-12-04 Frequency: Monthly  Progress: 0 Modality: individual  Related Interventions Teach the client calming/relaxation skills (e.g., applied relaxation, progressive muscle relaxation, cue controlled relaxation; mindful breathing; biofeedback) and how to discriminate better between relaxation and tension; teach the client how to apply these skills to his/her daily life (e.g., New Directions in Progressive Muscle Relaxation by Thornell Collier, and Hazlett-Stevens; Treating Generalized Anxiety Disorder by Rygh and Red). Assign the client to read about progressive muscle relaxation and other calming strategies in relevant books or treatment manuals (e.g., Progressive Relaxation Training by Thornell and Collier; Mastery of Your Anxiety and Worry: Workbook by River armin Given). Objective Verbalize an understanding of the role that cognitive biases play in excessive irrational worry and persistent anxiety symptoms. Target Date: 2024-12-04 Frequency: Monthly  Progress: 0 Modality: individual  Related Interventions Discuss examples demonstrating that unrealistic worry typically overestimates the probability of threats and underestimates or overlooks the client's ability to manage realistic demands (or assign Past Successful Anxiety Coping in the Adult Psychotherapy Homework Planner by Jenniffer). Assist the client in analyzing his/her worries by examining potential biases such as the probability of the negative expectation occurring, the real consequences of it occurring, his/her ability to control the outcome, the worst possible outcome, and his/her ability to accept it (see Analyze the Probability of a Feared Event in the Adult Psychotherapy Homework Planner by  Jongsma; Cognitive Therapy of Anxiety Disorders by Gretta armin Mon). Objective Learn to accept limitations in life and commit to tolerating, rather than avoiding, unpleasant emotions while accomplishing meaningful goals. Target Date: 2024-12-04 Frequency: Monthly  Progress: 0 Modality: individual  Related Interventions Use techniques from Acceptance and Commitment Therapy to help client accept uncomfortable realities such as lack of complete control, imperfections, and uncertainty and tolerate unpleasant emotions and thoughts in order to accomplish value-consistent goals. Objective Reestablish a consistent sleep-wake cycle. Target Date: 2024-12-04 Frequency: Monthly  Progress: 0 Modality: individual  Related Interventions Teach and implement sleep hygiene practices to help the client reestablish a consistent sleep-wake cycle; review, reinforce success, and provide corrective feedback toward improvement.  Diagnosis:Adjustment disorder with mixed anxiety and depressed mood  Plan:  -meet again on  Friday, April 16, 2024 at 11am in-person "

## 2024-04-16 ENCOUNTER — Ambulatory Visit: Admitting: Professional

## 2024-04-26 ENCOUNTER — Other Ambulatory Visit

## 2024-05-13 ENCOUNTER — Ambulatory Visit: Admitting: Professional

## 2024-06-11 ENCOUNTER — Ambulatory Visit: Admitting: Professional

## 2024-08-24 ENCOUNTER — Ambulatory Visit: Admitting: Internal Medicine
